# Patient Record
Sex: Female | Born: 1995 | Race: Black or African American | Hispanic: No | Marital: Single | Smoking: Never smoker
Health system: Southern US, Community
[De-identification: ages and names within clinical notes are randomized; demographics above are authoritative.]

## PROBLEM LIST (undated history)

## (undated) ENCOUNTER — Inpatient Hospital Stay (HOSPITAL_COMMUNITY): Payer: Self-pay

## (undated) ENCOUNTER — Ambulatory Visit: Admission: EM | Payer: Commercial Managed Care - PPO | Source: Home / Self Care

## (undated) DIAGNOSIS — K219 Gastro-esophageal reflux disease without esophagitis: Secondary | ICD-10-CM

## (undated) DIAGNOSIS — D869 Sarcoidosis, unspecified: Secondary | ICD-10-CM

## (undated) HISTORY — DX: Sarcoidosis, unspecified: D86.9

## (undated) HISTORY — DX: Gastro-esophageal reflux disease without esophagitis: K21.9

---

## 2017-06-19 DIAGNOSIS — R202 Paresthesia of skin: Secondary | ICD-10-CM | POA: Insufficient documentation

## 2018-03-26 ENCOUNTER — Encounter (HOSPITAL_COMMUNITY): Payer: Self-pay

## 2018-03-26 ENCOUNTER — Other Ambulatory Visit: Payer: Self-pay

## 2018-03-26 ENCOUNTER — Inpatient Hospital Stay (HOSPITAL_COMMUNITY)
Admission: AD | Admit: 2018-03-26 | Discharge: 2018-03-27 | Disposition: A | Discharge status: Home / Self Care | Payer: Medicaid Other | Source: Ambulatory Visit | Attending: Obstetrics and Gynecology | Admitting: Obstetrics and Gynecology

## 2018-03-26 DIAGNOSIS — O26899 Other specified pregnancy related conditions, unspecified trimester: Secondary | ICD-10-CM

## 2018-03-26 DIAGNOSIS — G5603 Carpal tunnel syndrome, bilateral upper limbs: Secondary | ICD-10-CM | POA: Insufficient documentation

## 2018-03-26 DIAGNOSIS — O99353 Diseases of the nervous system complicating pregnancy, third trimester: Secondary | ICD-10-CM | POA: Insufficient documentation

## 2018-03-26 DIAGNOSIS — Z8249 Family history of ischemic heart disease and other diseases of the circulatory system: Secondary | ICD-10-CM | POA: Diagnosis not present

## 2018-03-26 DIAGNOSIS — Z3A33 33 weeks gestation of pregnancy: Secondary | ICD-10-CM | POA: Diagnosis not present

## 2018-03-26 DIAGNOSIS — O26893 Other specified pregnancy related conditions, third trimester: Secondary | ICD-10-CM | POA: Diagnosis not present

## 2018-03-26 DIAGNOSIS — M25532 Pain in left wrist: Secondary | ICD-10-CM | POA: Insufficient documentation

## 2018-03-26 DIAGNOSIS — M79642 Pain in left hand: Secondary | ICD-10-CM | POA: Diagnosis not present

## 2018-03-26 DIAGNOSIS — M7989 Other specified soft tissue disorders: Secondary | ICD-10-CM | POA: Diagnosis present

## 2018-03-26 DIAGNOSIS — N9489 Other specified conditions associated with female genital organs and menstrual cycle: Secondary | ICD-10-CM

## 2018-03-26 DIAGNOSIS — G5600 Carpal tunnel syndrome, unspecified upper limb: Secondary | ICD-10-CM | POA: Diagnosis not present

## 2018-03-26 DIAGNOSIS — M25531 Pain in right wrist: Secondary | ICD-10-CM | POA: Insufficient documentation

## 2018-03-26 DIAGNOSIS — O9989 Other specified diseases and conditions complicating pregnancy, childbirth and the puerperium: Secondary | ICD-10-CM | POA: Insufficient documentation

## 2018-03-26 DIAGNOSIS — M79641 Pain in right hand: Secondary | ICD-10-CM | POA: Insufficient documentation

## 2018-03-26 DIAGNOSIS — O0933 Supervision of pregnancy with insufficient antenatal care, third trimester: Secondary | ICD-10-CM

## 2018-03-26 DIAGNOSIS — O1203 Gestational edema, third trimester: Secondary | ICD-10-CM | POA: Diagnosis not present

## 2018-03-26 LAB — URINALYSIS, ROUTINE W REFLEX MICROSCOPIC
BILIRUBIN URINE: NEGATIVE
Glucose, UA: NEGATIVE mg/dL
Hgb urine dipstick: NEGATIVE
Ketones, ur: NEGATIVE mg/dL
Leukocytes, UA: NEGATIVE
NITRITE: NEGATIVE
PH: 7 (ref 5.0–8.0)
Protein, ur: NEGATIVE mg/dL
SPECIFIC GRAVITY, URINE: 1.003 — AB (ref 1.005–1.030)

## 2018-03-26 LAB — POCT PREGNANCY, URINE: PREG TEST UR: POSITIVE — AB

## 2018-03-26 NOTE — MAU Note (Signed)
Pt states that she moved to [place] yesterday from [place].  Pt states that she has experienced feet swelling the last couple of weeks. SHe states she has been seen and worked up for this issue, however her appointment for the results was today. She missed it because of the move.   Pt states she has had wrist pain for 1 week.   Reports +FM   Denies vaginal bleeding or LOF.

## 2018-03-27 DIAGNOSIS — G5600 Carpal tunnel syndrome, unspecified upper limb: Secondary | ICD-10-CM

## 2018-03-27 DIAGNOSIS — O1203 Gestational edema, third trimester: Secondary | ICD-10-CM

## 2018-03-27 DIAGNOSIS — O26893 Other specified pregnancy related conditions, third trimester: Secondary | ICD-10-CM

## 2018-03-27 LAB — RAPID URINE DRUG SCREEN, HOSP PERFORMED
AMPHETAMINES: NOT DETECTED
BENZODIAZEPINES: NOT DETECTED
Barbiturates: NOT DETECTED
Cocaine: NOT DETECTED
Opiates: NOT DETECTED
Tetrahydrocannabinol: NOT DETECTED

## 2018-03-27 LAB — FETAL FIBRONECTIN: Fetal Fibronectin: NEGATIVE

## 2018-03-27 NOTE — MAU Provider Note (Signed)
Chief Complaint:  Foot Swelling and Wrist Pain   First Provider Initiated Contact with Patient 03/26/18 2349     HPI: Jodi Norman is a 22 y.o. G1P0 at 12w1dwho presents to maternity admissions reporting swelling in feet for the past few months and pain in both hands from preexisting carpal tunnel syndrome (which she has even when not pregnant).  No emergent complaints.  .  She reports good fetal movement, denies LOF, vaginal bleeding, vaginal itching/burning, urinary symptoms, h/a, dizziness, n/v, diarrhea, constipation or fever/chills.  She denies headache, visual changes or RUQ abdominal pain.  States moved here from [place] yesterday.  States had to leave "suddenly" due to "stress".  Denies threats or violence in her life. Does not offer any other explanation. Was due to go to OB there today to "find out about the workup for my swollen feet", "but couldn't go because I was here".  Did not call previous office to notify them.  States pregnancy has been normal except for EIF "which turned out to be normal".  Had workup for glucola but does not know results.  Has had no HTN.  Had one episode of 'preterm labor" but was sent home and not given any medication for it.    Wrist Pain   The pain is present in the left wrist and right wrist. This is a recurrent problem. The current episode started more than 1 year ago. There has been no history of extremity trauma. The problem occurs constantly. The problem has been unchanged. Pertinent negatives include no fever, inability to bear weight, itching, joint locking, joint swelling, limited range of motion or numbness. She has tried nothing for the symptoms.    RN Note: Pt states that she moved to [place] yesterday from [place]. Pt states that she has experienced feet swelling the last couple of weeks. SHe states she has been seen and worked up for this issue, however her appointment for the results was today. She missed it because of the move.  Pt states she  has had wrist pain for 1 week.  Reports +FM  Denies vaginal bleeding or LOF.  Past Medical History: History reviewed. No pertinent past medical history.  Past obstetric history: OB History  Gravida Para Term Preterm AB Living  1            SAB TAB Ectopic Multiple Live Births               # Outcome Date GA Lbr Len/2nd Weight Sex Delivery Anes PTL Lv  1 Current             Past Surgical History: History reviewed. No pertinent surgical history.  Family History: Family History  Problem Relation Age of Onset  . Hyperlipidemia Mother   . Hypertension Mother   . Hyperlipidemia Father   . Hypertension Father   . Diabetes Father     Social History: Social History   Tobacco Use  . Smoking status: Never Smoker  . Smokeless tobacco: Never Used  Substance Use Topics  . Alcohol use: Never    Frequency: Never  . Drug use: Never    Allergies: No Known Allergies  Meds:  No medications prior to admission.    I have reviewed patient's Past Medical Hx, Surgical Hx, Family Hx, Social Hx, medications and allergies.   ROS:  Review of Systems  Constitutional: Negative for fever.  Respiratory: Negative for shortness of breath.   Cardiovascular: Positive for leg swelling.  Genitourinary: Negative for  dysuria, pelvic pain and vaginal bleeding.  Skin: Negative for itching.  Neurological: Negative for numbness.   Other systems negative  Physical Exam   Patient Vitals for the past 24 hrs:  BP Temp Pulse Resp SpO2 Weight  03/26/18 2324 131/72 98.1 F (36.7 C) 78 12 99 % -  03/26/18 2301 - - - - - 95 kg   Constitutional: Well-developed, well-nourished female in no acute distress.  Cardiovascular: normal rate and rhythm Respiratory: normal effort, clear to auscultation bilaterally GI: Abd soft, non-tender, gravid appropriate for gestational age.   No rebound or guarding. MS: Extremities nontender, Tr-1+ pedal edema, normal ROM Neurologic: Alert and oriented x 4.  DTRs  2+, good ROM in both wrists GU: Neg CVAT.  PELVIC EXAM: Cervix pink, visually closed, without lesion, scant white creamy discharge, vaginal walls and external genitalia normal Bimanual exam: Cervix firm, posterior, neg CMT, uterus nontender, Fundal Height consistent with dates, adnexa without tenderness, enlargement, or mass  Dilation: Closed Effacement (%): Thick Station: -3, Ballotable Presentation: Vertex Exam by:: Hansel Feinstein, CNM   FHT:  Baseline 140 , moderate variability, accelerations present, no decelerations Contractions: Uterine irritability with rare contractions.    Labs: Results for orders placed or performed during the hospital encounter of 03/26/18 (from the past 24 hour(s))  Urinalysis, Routine w reflex microscopic     Status: Abnormal   Collection Time: 03/26/18 11:17 PM  Result Value Ref Range   Color, Urine STRAW (A) YELLOW   APPearance CLEAR CLEAR   Specific Gravity, Urine 1.003 (L) 1.005 - 1.030   pH 7.0 5.0 - 8.0   Glucose, UA NEGATIVE NEGATIVE mg/dL   Hgb urine dipstick NEGATIVE NEGATIVE   Bilirubin Urine NEGATIVE NEGATIVE   Ketones, ur NEGATIVE NEGATIVE mg/dL   Protein, ur NEGATIVE NEGATIVE mg/dL   Nitrite NEGATIVE NEGATIVE   Leukocytes, UA NEGATIVE NEGATIVE  Urine rapid drug screen (hosp performed)     Status: None   Collection Time: 03/26/18 11:17 PM  Result Value Ref Range   Opiates NONE DETECTED NONE DETECTED   Cocaine NONE DETECTED NONE DETECTED   Benzodiazepines NONE DETECTED NONE DETECTED   Amphetamines NONE DETECTED NONE DETECTED   Tetrahydrocannabinol NONE DETECTED NONE DETECTED   Barbiturates NONE DETECTED NONE DETECTED  Pregnancy, urine POC     Status: Abnormal   Collection Time: 03/26/18 11:19 PM  Result Value Ref Range   Preg Test, Ur POSITIVE (A) NEGATIVE  Fetal fibronectin     Status: None   Collection Time: 03/27/18 12:04 AM  Result Value Ref Range   Fetal Fibronectin NEGATIVE NEGATIVE    Imaging:  No results found.  MAU  Course/MDM: I have ordered labs and reviewed results. Fetal fibronectin done due to reported hx PTL  UDS sent.  Declined STD testing, states had it done recently.  NST reviewed, reactive ROI sent to clinic in [place] for records Patient states she may deliver at [facility], unsure  Living in [place] Has no plans to obtain prenatal care, will provide list.    Assessment: Single intrauterine pregnancy at [redacted]w[redacted]d Bilateral wrist pain Bilateral feet swelling causing discomfort Uterine irritability with negative fetal fibronectin  Plan: Discharge home Preterm Labor precautions and fetal kick counts Advised to seek prenatal care  And decide on place of delivery  Encouraged to return here or to other Urgent Care/ED if she develops worsening of symptoms, increase in pain, fever, or other concerning symptoms.   Pt stable at time of discharge.  Hansel Feinstein CNM,  MSN Certified Nurse-Midwife 03/27/2018 12:07 AM

## 2018-03-27 NOTE — Discharge Instructions (Signed)
Carpal Tunnel Syndrome Carpal tunnel syndrome is a condition that causes pain in your hand and arm. The carpal tunnel is a narrow area that is on the palm side of your wrist. Repeated wrist motion or certain diseases may cause swelling in the tunnel. This swelling can pinch the main nerve in the wrist (median nerve). Follow these instructions at home: If you have a splint:  Wear it as told by your doctor. Remove it only as told by your doctor.  Loosen the splint if your fingers: ? Become numb and tingle. ? Turn blue and cold.  Keep the splint clean and dry. General instructions  Take over-the-counter and prescription medicines only as told by your doctor.  Rest your wrist from any activity that may be causing your pain. If needed, talk to your employer about changes that can be made in your work, such as getting a wrist pad to use while typing.  If directed, apply ice to the painful area: ? Put ice in a plastic bag. ? Place a towel between your skin and the bag. ? Leave the ice on for 20 minutes, 2-3 times per day.  Keep all follow-up visits as told by your doctor. This is important.  Do any exercises as told by your doctor, physical therapist, or occupational therapist. Contact a doctor if:  You have new symptoms.  Medicine does not help your pain.  Your symptoms get worse. This information is not intended to replace advice given to you by your health care provider. Make sure you discuss any questions you have with your health care provider. Document Released: 05/03/2011 Document Revised: 10/20/2015 Document Reviewed: 09/29/2014 Elsevier Interactive Patient Education  2018 [place].   Abdominal Pain During Pregnancy Abdominal pain is common in pregnancy. Most of the time, it does not cause harm. There are many causes of abdominal pain. Some causes are more serious than others and sometimes the cause is not known. Abdominal pain can be a sign that something is very wrong  with the pregnancy or the pain may have nothing to do with the pregnancy. Always tell your health care provider if you have any abdominal pain. Follow these instructions at home:  Do not have sex or put anything in your vagina until your symptoms go away completely.  Watch your abdominal pain for any changes.  Get plenty of rest until your pain improves.  Drink enough fluid to keep your urine clear or pale yellow.  Take over-the-counter or prescription medicines only as told by your health care provider.  Keep all follow-up visits as told by your health care provider. This is important. Contact a health care provider if:  You have a fever.  Your pain gets worse or you have cramping.  Your pain continues after resting. Get help right away if:  You are bleeding, leaking fluid, or passing tissue from the vagina.  You have vomiting or diarrhea that does not go away.  You have painful or bloody urination.  You notice a decrease in your baby's movements. You feel very weak or faint. Edema Edema is an abnormal buildup of fluids in your bodytissues. Edema is somewhatdependent on gravity to pull the fluid to the lowest place in your body. That makes the condition more common in the legs and thighs (lower extremities). Painless swelling of the feet and ankles is common and becomes more likely as you get older. It is also common in looser tissues, like around your eyes. When the affected area is  squeezed, the fluid may move out of that spot and leave a dent for a few moments. This dent is called pitting. What are the causes? There are many possible causes of edema. Eating too much salt and being on your feet or sitting for a long time can cause edema in your legs and ankles. Hot weather may make edema worse. Common medical causes of edema include: Heart failure. Liver disease. Kidney disease. Weak blood vessels in your legs. Cancer. An injury. Pregnancy. Some  medications. Obesity.  What are the signs or symptoms? Edema is usually painless.Your skin may look swollen or shiny. How is this diagnosed? Your health care provider may be able to diagnose edema by asking about your medical history and doing a physical exam. You may need to have tests such as X-rays, an electrocardiogram, or blood tests to check for medical conditions that may cause edema. How is this treated? Edema treatment depends on the cause. If you have heart, liver, or kidney disease, you need the treatment appropriate for these conditions. General treatment may include: Elevation of the affected body part above the level of your heart. Compression of the affected body part. Pressure from elastic bandages or support stockings squeezes the tissues and forces fluid back into the blood vessels. This keeps fluid from entering the tissues. Restriction of fluid and salt intake. Use of a water pill (diuretic). These medications are appropriate only for some types of edema. They pull fluid out of your body and make you urinate more often. This gets rid of fluid and reduces swelling, but diuretics can have side effects. Only use diuretics as directed by your health care provider.  Follow these instructions at home: Keep the affected body part above the level of your heart when you are lying down. Do not sit still or stand for prolonged periods. Do not put anything directly under your knees when lying down. Do not wear constricting clothing or garters on your upper legs. Exercise your legs to work the fluid back into your blood vessels. This may help the swelling go down. Wear elastic bandages or support stockings to reduce ankle swelling as directed by your health care provider. Eat a low-salt diet to reduce fluid if your health care provider recommends it. Only take medicines as directed by your health care provider. Contact a health care provider if: Your edema is not responding to  treatment. You have heart, liver, or kidney disease and notice symptoms of edema. You have edema in your legs that does not improve after elevating them. You have sudden and unexplained weight gain. Get help right away if: You develop shortness of breath or chest pain. You cannot breathe when you lie down. You develop pain, redness, or warmth in the swollen areas. You have heart, liver, or kidney disease and suddenly get edema. You have a fever and your symptoms suddenly get worse. This information is not intended to replace advice given to you by your health care provider. Make sure you discuss any questions you have with your health care provider. Document Released: 05/14/2005 Document Revised: 10/20/2015 Document Reviewed: 03/06/2013 Elsevier Interactive Patient Education  2017 [place] have shortness of breath.  You develop a severe headache with abdominal pain.  You have abnormal vaginal discharge with abdominal pain. This information is not intended to replace advice given to you by your health care provider. Make sure you discuss any questions you have with your health care provider. Document Released: 05/14/2005 Document  Revised: 02/23/2016 Document Reviewed: 12/11/2012 Elsevier Interactive Patient Education  2018 [place] for [employer] at [facility]       Phone: [phone]  [facility] for [employer] at [place] Phone: [place] for [employer] at [place]  Phone: [place] for [place] at [facility]  Phone: [place] for [place] at [place]  Phone: [facility]       Phone: [phone]  [facility]    Phone: [phone]   [facility] [place])    Phone: [facility]    Phone: [phone]  [facility]    Phone: [address] [place]    Phone: [phone]  [place] [facility]       Phone: [phone]   [place] [facility]  Phone: [place]    Phone: [phone]  [facility]   Phone: [phone]  [facility]      Phone: [phone]  [facility]    Phone: [phone]

## 2018-05-07 DIAGNOSIS — G5603 Carpal tunnel syndrome, bilateral upper limbs: Secondary | ICD-10-CM | POA: Insufficient documentation

## 2018-08-06 ENCOUNTER — Encounter (HOSPITAL_COMMUNITY): Payer: Self-pay

## 2020-01-13 ENCOUNTER — Encounter (HOSPITAL_COMMUNITY): Payer: Self-pay | Admitting: Emergency Medicine

## 2020-01-13 ENCOUNTER — Emergency Department (HOSPITAL_COMMUNITY)
Admission: EM | Admit: 2020-01-13 | Discharge: 2020-01-13 | Disposition: A | Discharge status: Home / Self Care | Payer: Medicaid Other | Attending: Emergency Medicine | Admitting: Emergency Medicine

## 2020-01-13 ENCOUNTER — Other Ambulatory Visit: Payer: Self-pay

## 2020-01-13 DIAGNOSIS — J029 Acute pharyngitis, unspecified: Secondary | ICD-10-CM | POA: Insufficient documentation

## 2020-01-13 DIAGNOSIS — R0981 Nasal congestion: Secondary | ICD-10-CM | POA: Insufficient documentation

## 2020-01-13 DIAGNOSIS — Z20822 Contact with and (suspected) exposure to covid-19: Secondary | ICD-10-CM | POA: Insufficient documentation

## 2020-01-13 DIAGNOSIS — J069 Acute upper respiratory infection, unspecified: Secondary | ICD-10-CM | POA: Insufficient documentation

## 2020-01-13 DIAGNOSIS — R509 Fever, unspecified: Secondary | ICD-10-CM | POA: Insufficient documentation

## 2020-01-13 LAB — SARS CORONAVIRUS 2 BY RT PCR (HOSPITAL ORDER, PERFORMED IN ~~LOC~~ HOSPITAL LAB): SARS Coronavirus 2: NEGATIVE

## 2020-01-13 MED ORDER — BENZONATATE 100 MG PO CAPS: 100.0000 mg | ORAL_CAPSULE | Freq: Three times a day (TID) | ORAL | 0 refills | Status: DC

## 2020-01-13 MED ORDER — LIDOCAINE VISCOUS HCL 2 % MT SOLN: 15.0000 mL | OROMUCOSAL | 0 refills | Status: DC | PRN

## 2020-01-13 MED ORDER — IBUPROFEN 800 MG PO TABS
800.0000 mg | ORAL_TABLET | Freq: Once | ORAL | Status: AC
  Administered 2020-01-13: 800 mg via ORAL
  Filled 2020-01-13: qty 1

## 2020-01-13 MED ORDER — LIDOCAINE VISCOUS HCL 2 % MT SOLN
15.0000 mL | Freq: Once | OROMUCOSAL | Status: AC
  Administered 2020-01-13: 15 mL via OROMUCOSAL
  Filled 2020-01-13: qty 15

## 2020-01-13 MED ORDER — IBUPROFEN 800 MG PO TABS: 800.0000 mg | ORAL_TABLET | Freq: Three times a day (TID) | ORAL | 0 refills | Status: DC | PRN

## 2020-01-13 NOTE — Discharge Instructions (Signed)
I suspect you have a virus. We tested your for COVID-19 (coronavirus) infection.  It is also possible you could have other viral upper respiratory infection from another virus.     -Prescription has been sent to her pharmacy for Tessalon.  This is a cough medicine. -Prescription also sent for ibuprofen.  Take with food so it does not cause an upset stomach.  Take only if needed. -Prescription for lidocaine solution to help with your sore throat if needed.  Test results come back in 24 hours, sometimes sooner.  Someone will call you if ypu are positive for COVID. If the result is negative you can see it on your MyChart.  Treatment of your illness and symptoms will include self-isolation, monitoring of symptoms and supportive care with over-the-counter medicines.    Return to the ED if there is increased work of breathing, shortness of breath, inability to tolerate fluids, weakness, chest pain.  If your test results are POSITIVE, the following isolation requirements need to be met to return to work and resume essential activities: At least 10 days since symptom onset  72 hours of absence of fever without antifever medicine (ibuprofen, acetaminophen). A fever is temperature of 100.35F or greater. Improvement of respiratory symptoms  If your test is NEGATIVE, you may return to work and essential activities as long as your symptoms have improved and you do not have a fever for a total of 3 days.  Call your job and notify them that your test result was negative to see if they will allow you to return to work.   Stay well-hydrated. Rest. You can use over the counter medications to help with symptoms: 600 mg ibuprofen (motrin, aleve, advil) or acetaminophen (tylenol) every 6 hours, around the clock to help with associated fevers, sore throat, headaches, generalized body aches and malaise.  Oxymetazoline (afrin) intranasal spray once daily for no more than 3 days to help with congestion, after 3 days  you can switch to another over-the-counter nasal steroid spray such as fluticasone (flonase) Allergy medication (loratadine, cetirizine, etc) and phenylephrine (sudafed) help with nasal congestion, runny nose and postnasal drip.   Dextromethorphan (Delsym) to suppress dry cough. Frequent coughing is likely causing your chest wall pain Wash your hands often to prevent spread.    Infection Prevention Recommendations for Individuals Confirmed to have, or Being Evaluated for, or have symptoms of 2019 Novel Coronavirus (COVID-19) Infection Who Receive Care at Home  Individuals who are confirmed to have, or are being evaluated for, COVID-19 should follow the prevention steps below until a healthcare provider or local or state health department says they can return to normal activities.  Stay home except to get medical care You should restrict activities outside your home, except for getting medical care. Do not go to work, school, or public areas, and do not use public transportation or taxis.  Call ahead before visiting your doctor Before your medical appointment, call the healthcare provider and tell them that you have, or are being evaluated for, COVID-19 infection. This will help the healthcare provider's office take steps to keep other people from getting infected. Ask your healthcare provider to call the local or state health department.  Monitor your symptoms Seek prompt medical attention if your illness is worsening (e.g., difficulty breathing). Before going to your medical appointment, call the healthcare provider and tell them that you have, or are being evaluated for, COVID-19 infection. Ask your healthcare provider to call the local or state health department.  Wear  a facemask You should wear a facemask that covers your nose and mouth when you are in the same room with other people and when you visit a healthcare provider. People who live with or visit you should also wear a facemask  while they are in the same room with you.  Separate yourself from other people in your home As much as possible, you should stay in a different room from other people in your home. Also, you should use a separate bathroom, if available.  Avoid sharing household items You should not share dishes, drinking glasses, cups, eating utensils, towels, bedding, or other items with other people in your home. After using these items, you should wash them thoroughly with soap and water.  Cover your coughs and sneezes Cover your mouth and nose with a tissue when you cough or sneeze, or you can cough or sneeze into your sleeve. Throw used tissues in a lined trash can, and immediately wash your hands with soap and water for at least 20 seconds or use an alcohol-based hand rub.  Wash your [facility] your hands often and thoroughly with soap and water for at least 20 seconds. You can use an alcohol-based hand sanitizer if soap and water are not available and if your hands are not visibly dirty. Avoid touching your eyes, nose, and mouth with unwashed hands.   Prevention Steps for Caregivers and Household Members of Individuals Confirmed to have, or Being Evaluated for, or have symptoms of 2019 Novel Coronavirus (COVID-19) Infection Being Cared for in the Home  If you live with, or provide care at home for, a person confirmed to have, or being evaluated for, COVID-19 infection please follow these guidelines to prevent infection:  Follow healthcare provider's instructions Make sure that you understand and can help the patient follow any healthcare provider instructions for all care.  Provide for the patient's basic needs You should help the patient with basic needs in the home and provide support for getting groceries, prescriptions, and other personal needs.  Monitor the patient's symptoms If they are getting sicker, call his or her medical provider and tell them that the patient has, or is being evaluated  for, COVID-19 infection. This will help the healthcare provider's office take steps to keep other people from getting infected. Ask the healthcare provider to call the local or state health department.  Limit the number of people who have contact with the patient If possible, have only one caregiver for the patient. Other household members should stay in another home or place of residence. If this is not possible, they should stay in another room, or be separated from the patient as much as possible. Use a separate bathroom, if available. Restrict visitors who do not have an essential need to be in the home.  Keep older adults, very young children, and other sick people away from the patient Keep older adults, very young children, and those who have compromised immune systems or chronic health conditions away from the patient. This includes people with chronic heart, lung, or kidney conditions, diabetes, and cancer.  Ensure good ventilation Make sure that shared spaces in the home have good air flow, such as from an air conditioner or an opened window, weather permitting.  Wash your hands often Wash your hands often and thoroughly with soap and water for at least 20 seconds. You can use an alcohol based hand sanitizer if soap and water are not available and if your hands are not visibly dirty.  Avoid touching your eyes, nose, and mouth with unwashed hands. Use disposable paper towels to dry your hands. If not available, use dedicated cloth towels and replace them when they become wet.  Wear a facemask and gloves Wear a disposable facemask at all times in the room and gloves when you touch or have contact with the patient's blood, body fluids, and/or secretions or excretions, such as sweat, saliva, sputum, nasal mucus, vomit, urine, or feces.  Ensure the mask fits over your nose and mouth tightly, and do not touch it during use. Throw out disposable facemasks and gloves after using them. Do not  reuse. Wash your hands immediately after removing your facemask and gloves. If your personal clothing becomes contaminated, carefully remove clothing and launder. Wash your hands after handling contaminated clothing. Place all used disposable facemasks, gloves, and other waste in a lined container before disposing them with other household waste. Remove gloves and wash your hands immediately after handling these items.  Do not share dishes, glasses, or other household items with the patient Avoid sharing household items. You should not share dishes, drinking glasses, cups, eating utensils, towels, bedding, or other items with a patient who is confirmed to have, or being evaluated for, COVID-19 infection. After the person uses these items, you should wash them thoroughly with soap and water.  Wash laundry thoroughly Immediately remove and wash clothes or bedding that have blood, body fluids, and/or secretions or excretions, such as sweat, saliva, sputum, nasal mucus, vomit, urine, or feces, on them. Wear gloves when handling laundry from the patient. Read and follow directions on labels of laundry or clothing items and detergent. In general, wash and dry with the warmest temperatures recommended on the label.  Clean all areas the individual has used often Clean all touchable surfaces, such as counters, tabletops, doorknobs, bathroom fixtures, toilets, phones, keyboards, tablets, and bedside tables, every day. Also, clean any surfaces that may have blood, body fluids, and/or secretions or excretions on them. Wear gloves when cleaning surfaces the patient has come in contact with. Use a diluted bleach solution (e.g., dilute bleach with 1 part bleach and 10 parts water) or a household disinfectant with a label that says EPA-registered for coronaviruses. To make a bleach solution at home, add 1 tablespoon of bleach to 1 quart (4 cups) of water. For a larger supply, add  cup of bleach to 1 gallon (16  cups) of water. Read labels of cleaning products and follow recommendations provided on product labels. Labels contain instructions for safe and effective use of the cleaning product including precautions you should take when applying the product, such as wearing gloves or eye protection and making sure you have good ventilation during use of the product. Remove gloves and wash hands immediately after cleaning.  Monitor yourself for signs and symptoms of illness Caregivers and household members are considered close contacts, should monitor their health, and will be asked to limit movement outside of the home to the extent possible. Follow the monitoring steps for close contacts listed on the symptom monitoring form.  ? If you have additional questions, contact your local health department or call the epidemiologist on call at [phone] (available 24/7). ? This guidance is subject to change. For the most up-to-date guidance from [facility], please refer to their website: [employer]Blogs.pl

## 2020-01-13 NOTE — ED Provider Notes (Signed)
[facility] DEPT Provider Note   CSN: [id number] Arrival date & time: 01/13/20  0831     History Chief Complaint  Patient presents with  . Cough  . Sore Throat    Jodi Norman is a 24 y.o. female with no known past medical history.  Patient did not receive Covid vaccinations.  HPI Patient presents to emergency department today with chief complaint of URI symptoms.  She is endorsing fever, sore throat and nasal congestion x1 week.  Her 41 month old daughter is here with similar symptoms.  Patient states she has a nonproductive cough and subjective fever.  She describes her sore throat as a scratchy feeling when swallowing. She has been taking ibuprofen with symptom improvement.  She denies known Covid exposures.         History reviewed. No pertinent past medical history.  There are no problems to display for this patient.   History reviewed. No pertinent surgical history.   OB History    Gravida  1   Para      Term      Preterm      AB      Living        SAB      TAB      Ectopic      Multiple      Live Births              Family History  Problem Relation Age of Onset  . Hyperlipidemia Mother   . Hypertension Mother   . Hyperlipidemia Father   . Hypertension Father   . Diabetes Father     Social History   Tobacco Use  . Smoking status: Never Smoker  . Smokeless tobacco: Never Used  Substance Use Topics  . Alcohol use: Never  . Drug use: Never    Home Medications Prior to Admission medications   Medication Sig Start Date End Date Taking? Authorizing Provider  benzonatate (TESSALON) 100 MG capsule Take 1 capsule (100 mg total) by mouth every 8 (eight) hours. 01/13/20   Rena Sweeden E, PA-C  ibuprofen (ADVIL) 800 MG tablet Take 1 tablet (800 mg total) by mouth 3 (three) times daily as needed. 01/13/20   Sanaiyah Kirchhoff E, PA-C  lidocaine (XYLOCAINE) 2 % solution Use as directed 15 mLs in the mouth or  throat as needed for mouth pain. 01/13/20   Shamaria Kavan, Harley Hallmark, PA-C    Allergies    Patient has no known allergies.  Review of Systems   Review of Systems  Constitutional: Positive for fever.  HENT: Positive for congestion and sore throat.   All other systems reviewed and are negative.   Physical Exam Updated Vital Signs BP (!) 141/88 (BP Location: Left Arm)   Pulse 91   Temp 98.4 F (36.9 C) (Oral)   Resp 20   Ht 5\' 7"  (1.702 m)   Wt 95 kg   SpO2 99%   BMI 32.80 kg/m   Physical Exam Vitals and nursing note reviewed.  Constitutional:      Appearance: She is well-developed. She is not ill-appearing or toxic-appearing.  HENT:     Head: Normocephalic and atraumatic.     Comments: No sinus or temporal tenderness.    Nose: Congestion present.     Mouth/Throat:     Pharynx: Uvula midline. No oropharyngeal exudate or uvula swelling.     Comments: Minor erythema to oropharynx, no edema, no exudate, no tonsillar swelling, voice normal,  neck supple without lymphadenopathy  Eyes:     General: No scleral icterus.       Right eye: No discharge.        Left eye: No discharge.     Conjunctiva/sclera: Conjunctivae normal.  Neck:     Vascular: No JVD.  Cardiovascular:     Rate and Rhythm: Normal rate and regular rhythm.     Pulses: Normal pulses.     Heart sounds: Normal heart sounds.  Pulmonary:     Effort: Pulmonary effort is normal.     Breath sounds: Normal breath sounds.  Abdominal:     General: There is no distension.  Musculoskeletal:        General: Normal range of motion.     Cervical back: Normal range of motion.  Skin:    General: Skin is warm and dry.     Findings: No rash.  Neurological:     Mental Status: She is oriented to person, place, and time.     GCS: GCS eye subscore is 4. GCS verbal subscore is 5. GCS motor subscore is 6.     Comments: Fluent speech, no facial droop.  Psychiatric:        Behavior: Behavior normal.     ED Results / Procedures  / Treatments   Labs (all labs ordered are listed, but only abnormal results are displayed) Labs Reviewed  SARS CORONAVIRUS 2 BY RT PCR (HOSPITAL ORDER, [facility])    EKG None  Radiology No results found.  Procedures Procedures (including critical care time)  Medications Ordered in ED Medications  lidocaine (XYLOCAINE) 2 % viscous mouth solution 15 mL (15 mLs Mouth/Throat Given 01/13/20 1156)  ibuprofen (ADVIL) tablet 800 mg (800 mg Oral Given 01/13/20 1155)    ED Course  I have reviewed the triage vital signs and the nursing notes.  Pertinent labs & imaging results that were available during my care of the patient were reviewed by me and considered in my medical decision making (see chart for details).    MDM Rules/Calculators/A&P                          History provided by patient with additional history obtained from chart review.    I have reviewed patient's EMR to obtain pertinent PMH to assist in MDM.  Symptoms and exam most suggestive of uncomplicated viral illness. DDX incluldes viral URI/LRI, COVID-19.  No travel. No known exposures to confirmed COVID-19.    Exam is benign.  Normal WOB. No fever, tachypnea, tachycardia, hypoxemia. Lungs are CTAB. I do not think that a CXR is indicated at this time as VS are WNL, there are no signs of consolidation on auscultation and there is no hypoxia, increased WOB or other concerning features to exam. No significant h/o immunocompromise. Doubt bacterial bronchitis or pneumonia.  No signs or symptoms to suggest strep pharyngitis.  No clinical signs of severe illness, dehydration, to warrant further emergent work up in ER. Covid test is pending. Patient ambulated in the emergency department without respiratory distress or hypoxia, SpO2 >95% on room air.  Patient denies chance of pregnancy and politely refuses pregnancy test.  Given reassuring physical exam, symptoms, will discharge with symptomatic  treatment. Recommend telemedicine PCP f/u in the next 2-3 days for persistent symptoms  for further guidance. Self-isolation instructions discussed. Pt was given home self-isolation instructions and instructions for family members.   Pt understands signs and  symptoms that would warrant return to ED.  Pt comfortable and agreeable with POC.  Jodi Norman was evaluated in Emergency Department on 01/13/2020 for the symptoms described in the history of present illness. She was evaluated in the context of the global COVID-19 pandemic, which necessitated consideration that the patient might be at risk for infection with the SARS-CoV-2 virus that causes COVID-19. Institutional protocols and algorithms that pertain to the evaluation of patients at risk for COVID-19 are in a state of rapid change based on information released by regulatory bodies including the CDC and federal and state organizations. These policies and algorithms were followed during the patient's care in the ED.   Portions of this note were generated with [employer]. Dictation errors may occur despite best attempts at proofreading.   Final Clinical Impression(s) / ED Diagnoses Final diagnoses:  Upper respiratory tract infection, unspecified type    Rx / DC Orders ED Discharge Orders         Ordered    lidocaine (XYLOCAINE) 2 % solution  As needed     Discontinue  Reprint     01/13/20 1201    benzonatate (TESSALON) 100 MG capsule  Every 8 hours     Discontinue  Reprint     01/13/20 1201    ibuprofen (ADVIL) 800 MG tablet  3 times daily PRN     Discontinue  Reprint     01/13/20 1201           Jil Penland, Harley Hallmark, PA-C 01/13/20 1204    Milton Ferguson, MD 01/15/20 [phone]

## 2020-01-13 NOTE — ED Triage Notes (Signed)
States she got sick from her child, has had a fever, sore throat and congestion x1 week. States she just does not feel well.

## 2020-06-22 ENCOUNTER — Emergency Department (HOSPITAL_BASED_OUTPATIENT_CLINIC_OR_DEPARTMENT_OTHER): Payer: Medicaid Other

## 2020-06-22 ENCOUNTER — Emergency Department (HOSPITAL_BASED_OUTPATIENT_CLINIC_OR_DEPARTMENT_OTHER)
Admission: EM | Admit: 2020-06-22 | Discharge: 2020-06-23 | Disposition: A | Discharge status: Home / Self Care | Payer: Medicaid Other | Attending: Emergency Medicine | Admitting: Emergency Medicine

## 2020-06-22 ENCOUNTER — Encounter (HOSPITAL_BASED_OUTPATIENT_CLINIC_OR_DEPARTMENT_OTHER): Payer: Self-pay | Admitting: *Deleted

## 2020-06-22 ENCOUNTER — Other Ambulatory Visit: Payer: Self-pay

## 2020-06-22 DIAGNOSIS — U071 COVID-19: Secondary | ICD-10-CM | POA: Insufficient documentation

## 2020-06-22 DIAGNOSIS — M899 Disorder of bone, unspecified: Secondary | ICD-10-CM | POA: Diagnosis not present

## 2020-06-22 DIAGNOSIS — R0602 Shortness of breath: Secondary | ICD-10-CM | POA: Diagnosis present

## 2020-06-22 DIAGNOSIS — Q782 Osteopetrosis: Secondary | ICD-10-CM

## 2020-06-22 DIAGNOSIS — R5383 Other fatigue: Secondary | ICD-10-CM

## 2020-06-22 LAB — D-DIMER, QUANTITATIVE (NOT AT ARMC): D-Dimer, Quant: 0.83 ug/mL-FEU — ABNORMAL HIGH (ref 0.00–0.50)

## 2020-06-22 LAB — CBC WITH DIFFERENTIAL/PLATELET
Abs Immature Granulocytes: 0.02 10*3/uL (ref 0.00–0.07)
Basophils Absolute: 0 10*3/uL (ref 0.0–0.1)
Basophils Relative: 0 %
Eosinophils Absolute: 0.1 10*3/uL (ref 0.0–0.5)
Eosinophils Relative: 2 %
HCT: 37.2 % (ref 36.0–46.0)
Hemoglobin: 12.5 g/dL (ref 12.0–15.0)
Immature Granulocytes: 0 %
Lymphocytes Relative: 49 %
Lymphs Abs: 3.2 10*3/uL (ref 0.7–4.0)
MCH: 27.9 pg (ref 26.0–34.0)
MCHC: 33.6 g/dL (ref 30.0–36.0)
MCV: 83 fL (ref 80.0–100.0)
Monocytes Absolute: 0.4 10*3/uL (ref 0.1–1.0)
Monocytes Relative: 6 %
Neutro Abs: 2.9 10*3/uL (ref 1.7–7.7)
Neutrophils Relative %: 43 %
Platelets: 338 10*3/uL (ref 150–400)
RBC: 4.48 MIL/uL (ref 3.87–5.11)
RDW: 13.4 % (ref 11.5–15.5)
WBC: 6.6 10*3/uL (ref 4.0–10.5)
nRBC: 0 % (ref 0.0–0.2)

## 2020-06-22 LAB — COMPREHENSIVE METABOLIC PANEL
ALT: 25 U/L (ref 0–44)
AST: 27 U/L (ref 15–41)
Albumin: 4.5 g/dL (ref 3.5–5.0)
Alkaline Phosphatase: 100 U/L (ref 38–126)
Anion gap: 14 (ref 5–15)
BUN: 8 mg/dL (ref 6–20)
CO2: 21 mmol/L — ABNORMAL LOW (ref 22–32)
Calcium: 9.2 mg/dL (ref 8.9–10.3)
Chloride: 102 mmol/L (ref 98–111)
Creatinine, Ser: 0.68 mg/dL (ref 0.44–1.00)
GFR, Estimated: >60 mL/min (ref >60)
Glucose, Bld: 137 mg/dL — ABNORMAL HIGH (ref 70–99)
Potassium: 3.2 mmol/L — ABNORMAL LOW (ref 3.5–5.1)
Sodium: 137 mmol/L (ref 135–145)
Total Bilirubin: 0.2 mg/dL — ABNORMAL LOW (ref 0.3–1.2)
Total Protein: 8.3 g/dL — ABNORMAL HIGH (ref 6.5–8.1)

## 2020-06-22 LAB — HCG, SERUM, QUALITATIVE: Preg, Serum: NEGATIVE

## 2020-06-22 LAB — TROPONIN I (HIGH SENSITIVITY): Troponin I (High Sensitivity): 2 ng/L (ref <18)

## 2020-06-22 IMAGING — CT CT ANGIO CHEST
2 of 9 series · 18 of 37 positions shown · IV contrast (Omnipaque)
Comparison: Chest radiograph dated [DATE].

CLINICAL DATA: 24-year-old female with chest pain and shortness of
breath.

EXAM:
CT ANGIOGRAPHY CHEST WITH CONTRAST
TECHNIQUE: Multidetector CT imaging of the chest was performed using the
standard protocol during bolus administration of intravenous
contrast. Multiplanar CT image reconstructions and MIPs were
obtained to evaluate the vascular anatomy.
CONTRAST:  100mL OMNIPAQUE IOHEXOL 350 MG/ML SOLN

[Series 7: pe thins · axial · 0.62mm/px · z∈[-98,+125]mm · 15 of 251 slices shown]
[im 14/251  lung]
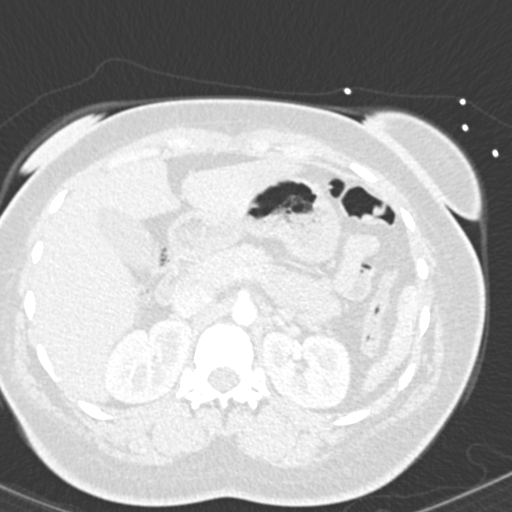
[im 27/251  mediastinal]
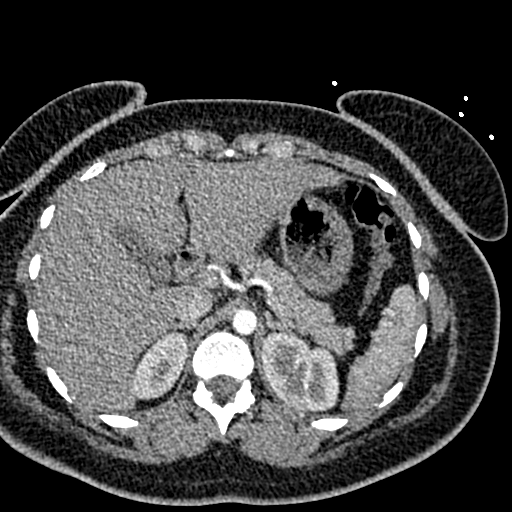
[im 53/251  lung]
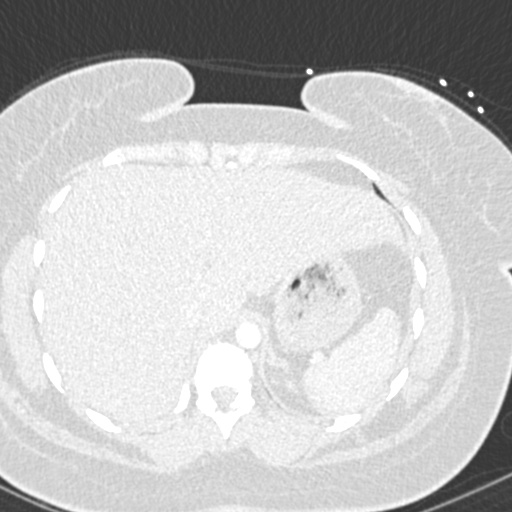
[im 66/251  mediastinal]
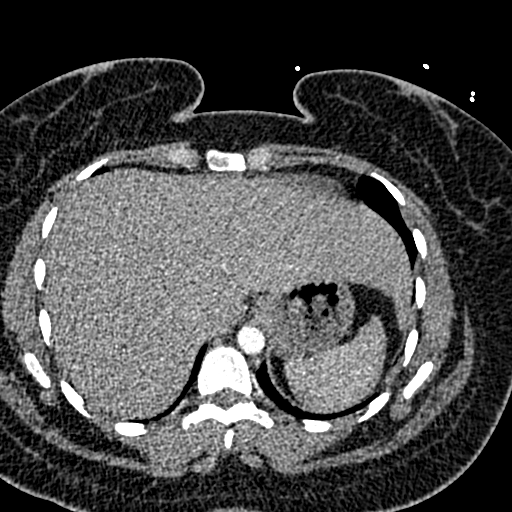
[im 79/251  lung]
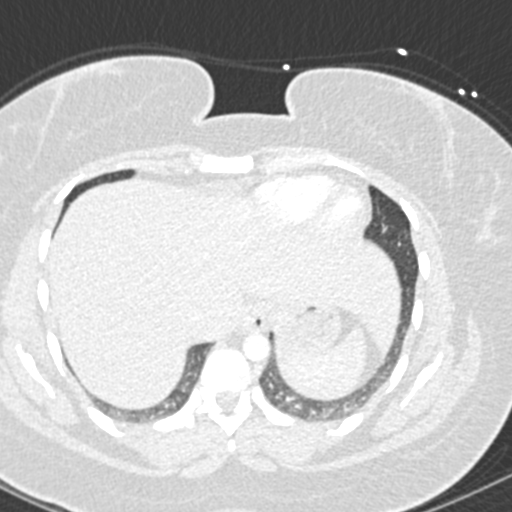
[im 93/251  mediastinal]
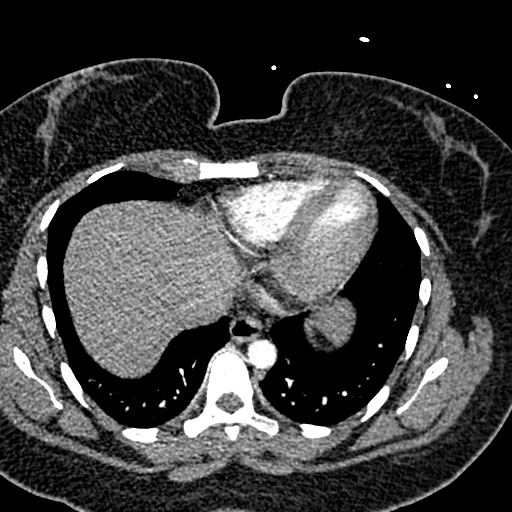
[im 106/251  lung]
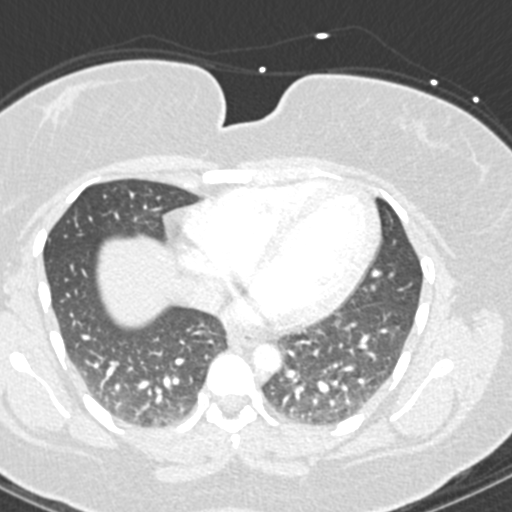
[im 132/251  mediastinal]
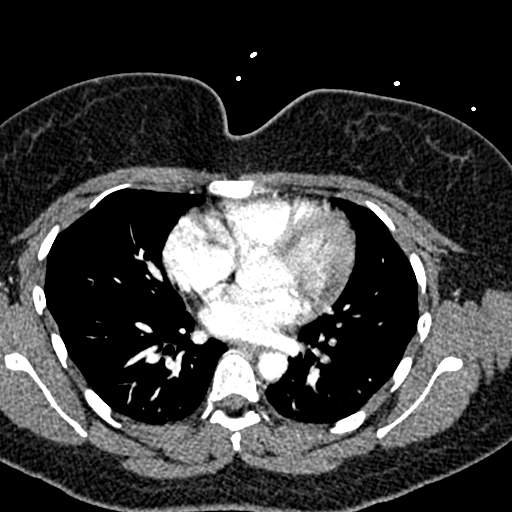
[im 145/251  lung]
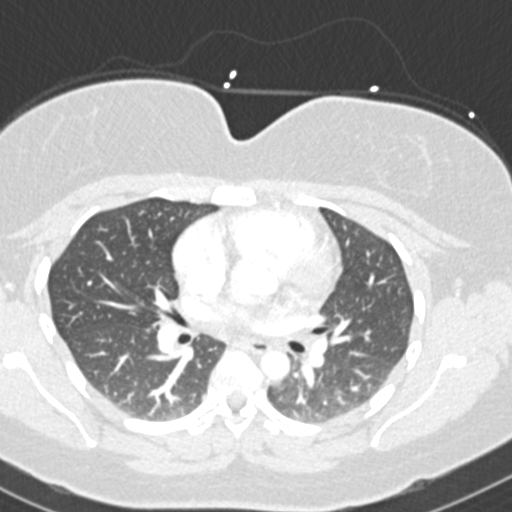
[im 158/251  mediastinal]
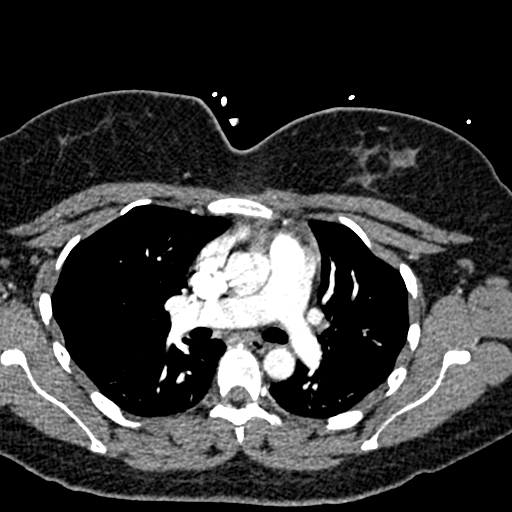
[im 172/251  lung]
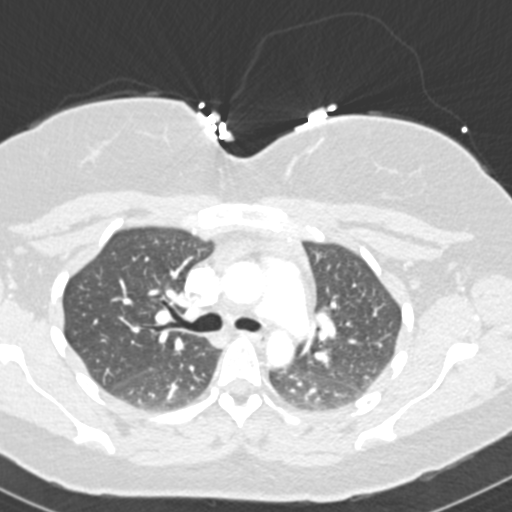
[im 185/251  mediastinal]
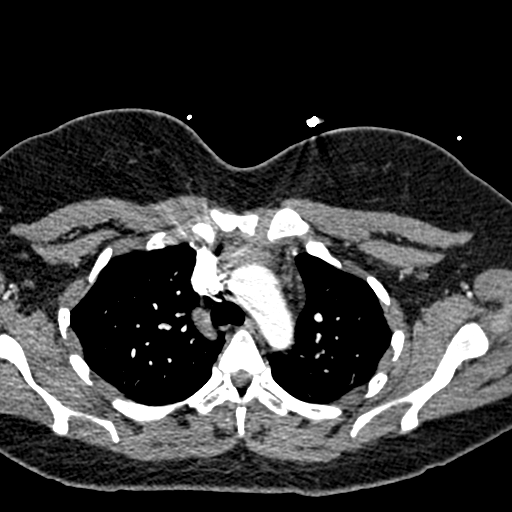
[im 211/251  lung]
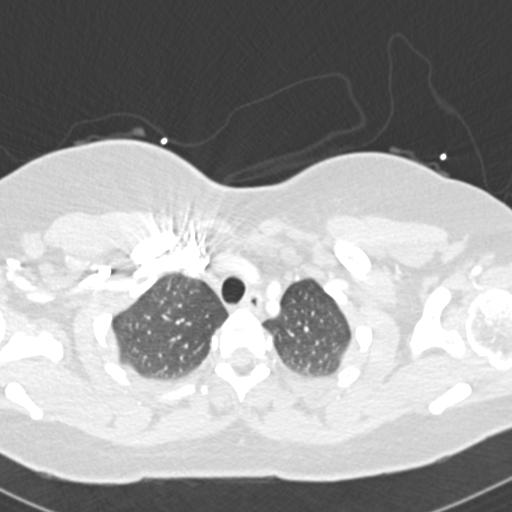
[im 224/251  mediastinal]
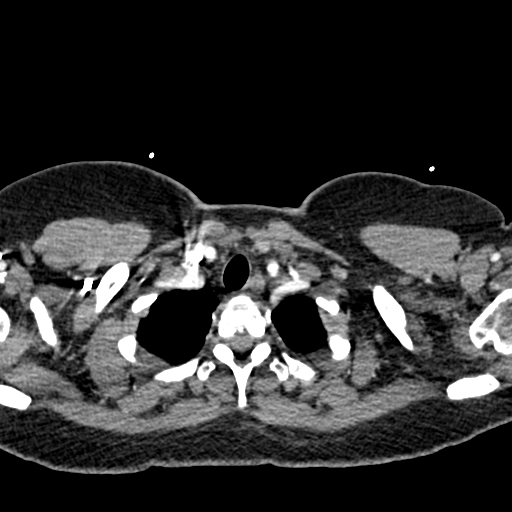
[im 237/251  lung]
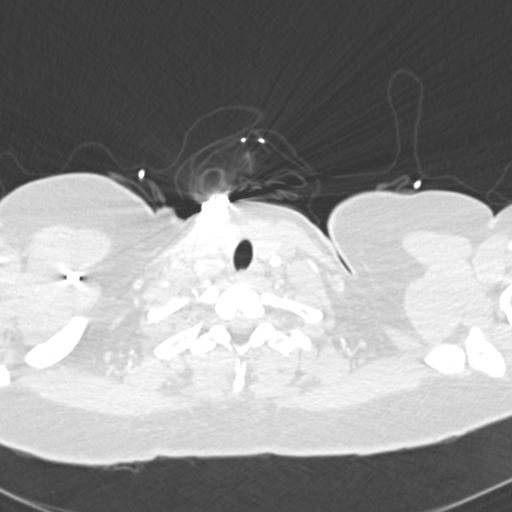

[Series 8: pe coronal mpr · coronal · 0.55mm/px · 3 of 113 slices shown]
[im 23/113  mediastinal]
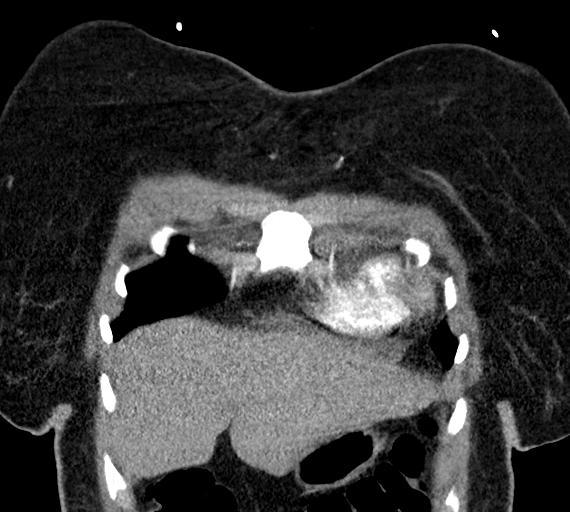
[im 45/113  mediastinal]
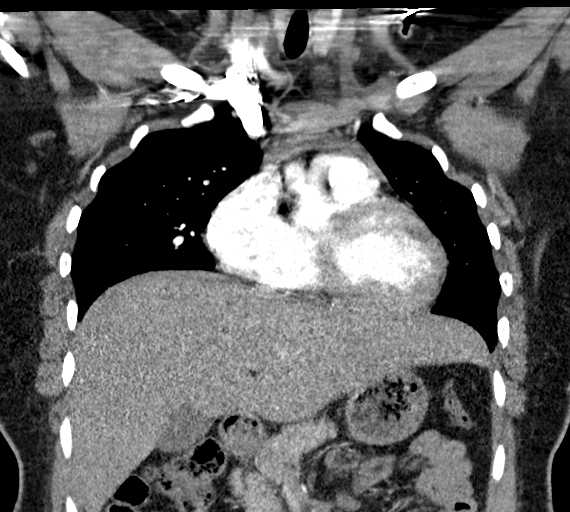
[im 68/113  mediastinal]
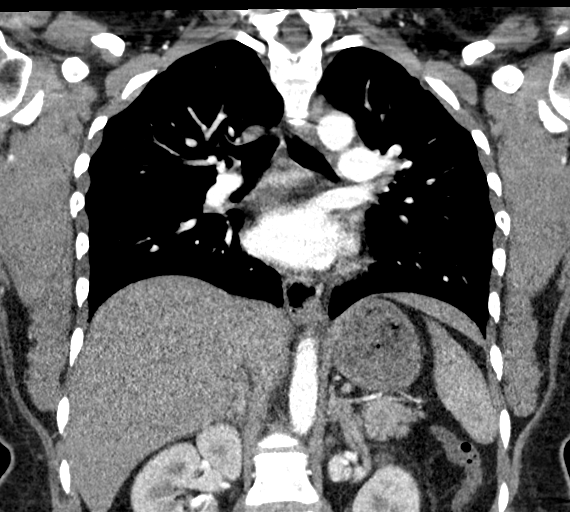

[18 of 37 positions shown; findings below may reference images not displayed]

FINDINGS: Cardiovascular: There is no cardiomegaly or pericardial effusion.
The thoracic aorta is unremarkable. The origins of the great vessels
of the aortic arch appear patent. Evaluation of the pulmonary
arteries is limited due to respiratory motion artifact. No pulmonary
artery embolus identified.

Mediastinum/Nodes: There is no hilar adenopathy. Mildly enlarged
lymph node in the lower neck to the right of the trachea ([DATE])
measuring 12 mm in short axis. Clinical correlation and further
evaluation with ultrasound on a nonemergent/outpatient basis
recommended. The esophagus is grossly unremarkable. No mediastinal
fluid collection.

Lungs/Pleura: The lungs are clear. There is no pleural effusion
pneumothorax. Central airways are patent.

Upper Abdomen: No acute abnormality.

Musculoskeletal: There are sclerotic changes involving the body of
the sternum as well as sclerotic changes of T4 and T9 vertebra.
Additional smaller sclerotic changes involving T7 and L1 most
consistent with metastatic disease. Additional osseous metastasis
involving the clavicular heads and ribs. No acute osseous pathology.

Review of the MIP images confirms the above findings.
IMPRESSION: 1. No CT evidence of pulmonary embolism.
2. Osseous sclerotic metastatic disease. Correlation with history of
primary malignancy recommended.
3. Mildly enlarged lymph node in the lower neck to the right of the
trachea concerning for metastatic disease.

## 2020-06-22 IMAGING — DX DG CHEST 1V PORT
1 series · 1 of 1 positions shown · non-contrast
Comparison: None.

CLINICAL DATA: 24-year-old female with shortness of breath.

EXAM:
PORTABLE CHEST 1 VIEW

[chest ap]
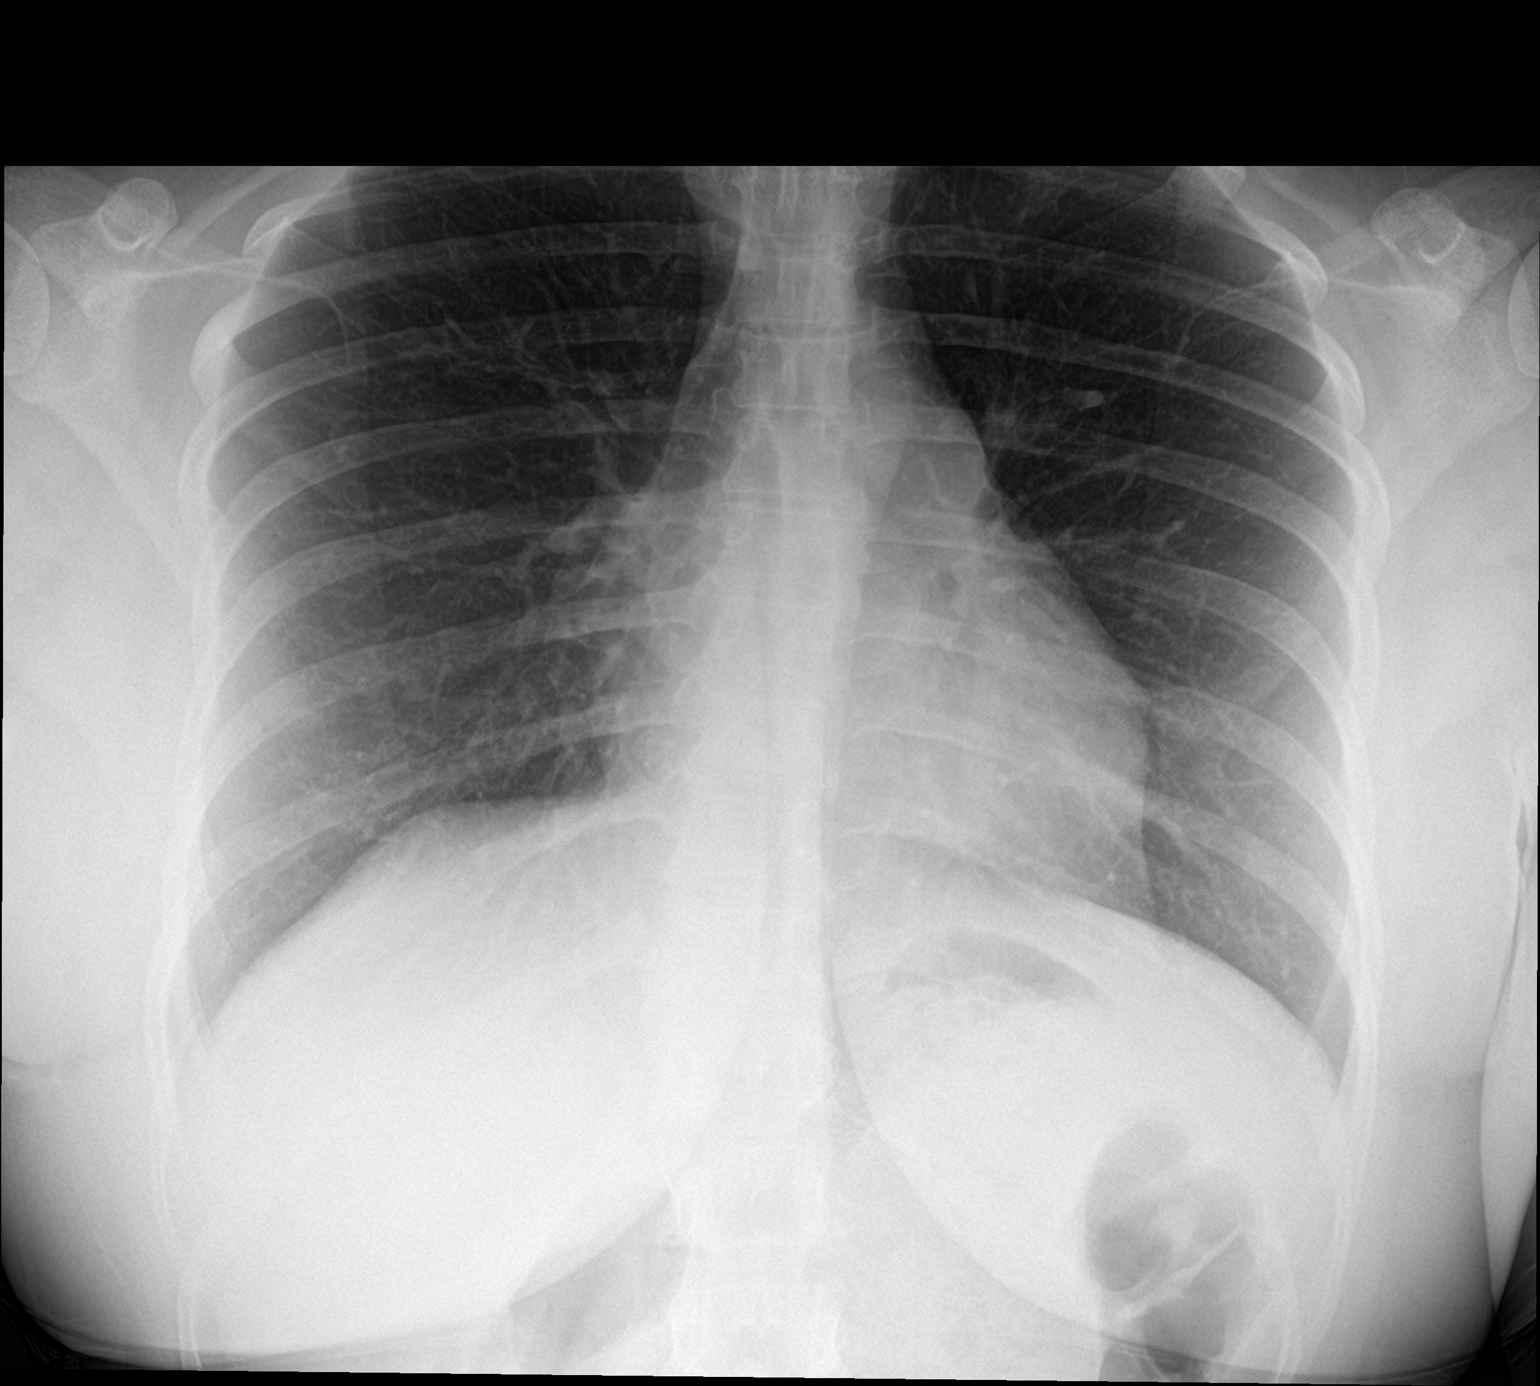

[1 of 1 positions shown; findings below may reference images not displayed]

FINDINGS: No focal consolidation, pleural effusion or pneumothorax. The
cardiac silhouette is within limits. No acute osseous pathology.
IMPRESSION: No active disease.

## 2020-06-22 MED ORDER — ONDANSETRON HCL 4 MG/2ML IJ SOLN
4.0000 mg | Freq: Once | INTRAMUSCULAR | Status: AC
  Administered 2020-06-22: 4 mg via INTRAVENOUS
  Filled 2020-06-22: qty 2

## 2020-06-22 MED ORDER — HYDROXYZINE HCL 25 MG PO TABS: 25.0000 mg | ORAL_TABLET | Freq: Four times a day (QID) | ORAL | 0 refills | Status: DC | PRN

## 2020-06-22 MED ORDER — HYDROXYZINE HCL 25 MG PO TABS
25.0000 mg | ORAL_TABLET | Freq: Once | ORAL | Status: AC
  Administered 2020-06-22: 25 mg via ORAL
  Filled 2020-06-22: qty 1

## 2020-06-22 MED ORDER — IOHEXOL 350 MG/ML SOLN
100.0000 mL | Freq: Once | INTRAVENOUS | Status: AC | PRN
  Administered 2020-06-22: 100 mL via INTRAVENOUS

## 2020-06-22 NOTE — Discharge Instructions (Addendum)
Please follow-up with oncology as discussed to discuss further testing.  The radiologist saw areas concerning for sclerotic metastatic disease in your sternum, thoracic vertebra among other places.  The underlying process is not certain at this time.  You will need additional testing.

## 2020-06-22 NOTE — ED Triage Notes (Signed)
C/o SOb, fatigue , generalized weakness x 1 month

## 2020-06-22 NOTE — ED Provider Notes (Signed)
[facility] EMERGENCY DEPARTMENT Provider Note   CSN: [id number] Arrival date & time: 06/22/20  1842     History Chief Complaint  Patient presents with  . Shortness of Breath    Jodi Norman is a 25 y.o. female.  Presents ER with concern for shortness of breath.  Patient reports for the past month she has felt very fatigued, short of breath, weak.  States that breathing is worse with exertion, difficulty going up flights of steps due to feeling winded.  Has occasional episodes of chest pain, none today.  Generally occur at rest, bleeding, lasting minutes, central, nonradiating.  No cough, fevers.  A couple weeks ago she did have some chills and night sweats. States she went to a provider at [facility] and they performed basic lab work which she was told was grossly normal except for a mildly low white blood cell count.  She denies any medical problems.  HPI     History reviewed. No pertinent past medical history.  There are no problems to display for this patient.   History reviewed. No pertinent surgical history.   OB History    Gravida  1   Para      Term      Preterm      AB      Living        SAB      IAB      Ectopic      Multiple      Live Births              Family History  Problem Relation Age of Onset  . Hyperlipidemia Mother   . Hypertension Mother   . Hyperlipidemia Father   . Hypertension Father   . Diabetes Father     Social History   Tobacco Use  . Smoking status: Never Smoker  . Smokeless tobacco: Never Used  Substance Use Topics  . Alcohol use: Never  . Drug use: Never    Home Medications Prior to Admission medications   Medication Sig Start Date End Date Taking? Authorizing Provider  hydrOXYzine (ATARAX/VISTARIL) 25 MG tablet Take 1 tablet (25 mg total) by mouth every 6 (six) hours as needed. 06/22/20  Yes Lucrezia Starch, MD  benzonatate (TESSALON) 100 MG capsule Take 1 capsule (100 mg total) by mouth  every 8 (eight) hours. 01/13/20   Walisiewicz, Verline Lema E, PA-C  clonazePAM (KLONOPIN) 0.25 MG disintegrating tablet Take 0.25 mg by mouth daily. 06/16/20   [provider]  ibuprofen (ADVIL) 800 MG tablet Take 1 tablet (800 mg total) by mouth 3 (three) times daily as needed. 01/13/20   Walisiewicz, Verline Lema E, PA-C  lidocaine (XYLOCAINE) 2 % solution Use as directed 15 mLs in the mouth or throat as needed for mouth pain. 01/13/20   Barrie Folk, PA-C    Allergies    Patient has no known allergies.  Review of Systems   Review of Systems  Constitutional: Positive for chills and fatigue. Negative for fever.  HENT: Negative for ear pain and sore throat.   Eyes: Negative for pain and visual disturbance.  Respiratory: Positive for shortness of breath. Negative for cough.   Cardiovascular: Positive for chest pain. Negative for palpitations.  Gastrointestinal: Negative for abdominal pain and vomiting.  Genitourinary: Negative for dysuria and hematuria.  Musculoskeletal: Negative for arthralgias and back pain.  Skin: Negative for color change and rash.  Neurological: Negative for seizures and syncope.  All other systems  reviewed and are negative.   Physical Exam Updated Vital Signs BP 136/74 (BP Location: Right Arm)   Pulse 86   Temp 98.7 F (37.1 C) (Oral)   Resp 14   Ht 5\' 7"  (1.702 m)   Wt 92.1 kg   LMP 06/13/2020   SpO2 100%   BMI 31.79 kg/m   Physical Exam Vitals and nursing note reviewed.  Constitutional:      General: She is not in acute distress.    Appearance: She is well-developed and well-nourished.  HENT:     Head: Normocephalic and atraumatic.  Eyes:     Conjunctiva/sclera: Conjunctivae normal.  Cardiovascular:     Rate and Rhythm: Normal rate and regular rhythm.     Heart sounds: No murmur heard.   Pulmonary:     Effort: Pulmonary effort is normal. No respiratory distress.     Breath sounds: Normal breath sounds.  Abdominal:     Palpations:  Abdomen is soft.     Tenderness: There is no abdominal tenderness.  Musculoskeletal:        General: No edema.     Cervical back: Neck supple.  Skin:    General: Skin is warm and dry.  Neurological:     General: No focal deficit present.     Mental Status: She is alert.  Psychiatric:        Mood and Affect: Mood and affect normal.     ED Results / Procedures / Treatments   Labs (all labs ordered are listed, but only abnormal results are displayed) Labs Reviewed  COMPREHENSIVE METABOLIC PANEL - Abnormal; Notable for the following components:      Result Value   Potassium 3.2 (*)    CO2 21 (*)    Glucose, Bld 137 (*)    Total Protein 8.3 (*)    Total Bilirubin 0.2 (*)    All other components within normal limits  D-DIMER, QUANTITATIVE (NOT AT [facility]) - Abnormal; Notable for the following components:   D-Dimer, Quant 0.83 (*)    All other components within normal limits  SARS CORONAVIRUS 2 (TAT 6-24 HRS)  CBC WITH DIFFERENTIAL/PLATELET  HCG, SERUM, QUALITATIVE  URINALYSIS, ROUTINE W REFLEX MICROSCOPIC  TSH  TROPONIN I (HIGH SENSITIVITY)    EKG None  Radiology CT Angio Chest PE W and/or Wo Contrast  Result Date: 06/22/2020 CLINICAL DATA:  25 year old female with chest pain and shortness of breath. EXAM: CT ANGIOGRAPHY CHEST WITH CONTRAST TECHNIQUE: Multidetector CT imaging of the chest was performed using the standard protocol during bolus administration of intravenous contrast. Multiplanar CT image reconstructions and MIPs were obtained to evaluate the vascular anatomy. CONTRAST:  148mL OMNIPAQUE IOHEXOL 350 MG/ML SOLN COMPARISON:  Chest radiograph dated 06/22/2020. FINDINGS: Cardiovascular: There is no cardiomegaly or pericardial effusion. The thoracic aorta is unremarkable. The origins of the great vessels of the aortic arch appear patent. Evaluation of the pulmonary arteries is limited due to respiratory motion artifact. No pulmonary artery embolus identified.  Mediastinum/Nodes: There is no hilar adenopathy. Mildly enlarged lymph node in the lower neck to the right of the trachea (17/7) measuring 12 mm in short axis. Clinical correlation and further evaluation with ultrasound on a nonemergent/outpatient basis recommended. The esophagus is grossly unremarkable. No mediastinal fluid collection. Lungs/Pleura: The lungs are clear. There is no pleural effusion pneumothorax. Central airways are patent. Upper Abdomen: No acute abnormality. Musculoskeletal: There are sclerotic changes involving the body of the sternum as well as sclerotic changes of T4 and T9  vertebra. Additional smaller sclerotic changes involving T7 and L1 most consistent with metastatic disease. Additional osseous metastasis involving the clavicular heads and ribs. No acute osseous pathology. Review of the MIP images confirms the above findings. IMPRESSION: 1. No CT evidence of pulmonary embolism. 2. Osseous sclerotic metastatic disease. Correlation with history of primary malignancy recommended. 3. Mildly enlarged lymph node in the lower neck to the right of the trachea concerning for metastatic disease. Electronically Signed   By: Anner Crete M.D.   On: 06/22/2020 23:20   DG Chest Portable 1 View  Result Date: 06/22/2020 CLINICAL DATA:  25 year old female with shortness of breath. EXAM: PORTABLE CHEST 1 VIEW COMPARISON:  None. FINDINGS: No focal consolidation, pleural effusion or pneumothorax. The cardiac silhouette is within limits. No acute osseous pathology. IMPRESSION: No active disease. Electronically Signed   By: Anner Crete M.D.   On: 06/22/2020 19:09    Procedures Procedures   Medications Ordered in ED Medications  hydrOXYzine (ATARAX/VISTARIL) tablet 25 mg (25 mg Oral Given 06/22/20 2155)  iohexol (OMNIPAQUE) 350 MG/ML injection 100 mL (100 mLs Intravenous Contrast Given 06/22/20 2246)  ondansetron (ZOFRAN) injection 4 mg (4 mg Intravenous Given 06/22/20 2322)    ED Course   I have reviewed the triage vital signs and the nursing notes.  Pertinent labs & imaging results that were available during my care of the patient were reviewed by me and considered in my medical decision making (see chart for details).    MDM Rules/Calculators/A&P                         25 year old lady presenting to the emergency room with concern for generalized fatigue, weakness, shortness of breath.  On physical exam, she is noted to be well-appearing in no acute distress.  Initial vitals stable except for mild tachycardia which resolved with observation.  Initial basic labs were grossly within normal limits.  Will send Covid testing.  D-dimer mildly elevated, will check CT PE study.  The CT scan was negative for acute pulmonary embolism however.  CT was concerning for diffuse sclerotic lesions raising suspicion for metastatic disease.  Primary source is uncertain at this time.  I discussed this news with patient in detail as well as her mother via phone.  Reviewed case with on-call oncology Dr. Chryl Heck.  She recommends outpatient oncology referral.  Patient demonstrated understanding of these findings and need for close outpatient follow-up.  Vital signs remained stable and well-appearing, do not see medication for admission at this time.  She was discharged home.   After the discussed management above, the patient was determined to be safe for discharge.  The patient was in agreement with this plan and all questions regarding their care were answered.  ED return precautions were discussed and the patient will return to the ED with any significant worsening of condition.    Final Clinical Impression(s) / ED Diagnoses Final diagnoses:  Bony sclerosis  Fatigue, unspecified type    Rx / DC Orders ED Discharge Orders         Ordered    Ambulatory referral to Oncology       Comments: Osseous sclerotic metastatic disease on CT scan - unknown primary   06/22/20 2354    hydrOXYzine  (ATARAX/VISTARIL) 25 MG tablet  Every 6 hours PRN        06/22/20 2356           Lucrezia Starch, MD 06/23/20 0001

## 2020-06-23 ENCOUNTER — Other Ambulatory Visit: Payer: Self-pay | Admitting: Family

## 2020-06-23 ENCOUNTER — Encounter: Payer: Self-pay | Admitting: *Deleted

## 2020-06-23 DIAGNOSIS — R519 Headache, unspecified: Secondary | ICD-10-CM

## 2020-06-23 DIAGNOSIS — R42 Dizziness and giddiness: Secondary | ICD-10-CM

## 2020-06-23 DIAGNOSIS — C7951 Secondary malignant neoplasm of bone: Secondary | ICD-10-CM

## 2020-06-23 LAB — SARS CORONAVIRUS 2 (TAT 6-24 HRS): SARS Coronavirus 2: POSITIVE — AB

## 2020-06-23 LAB — TSH: TSH: 1.382 u[IU]/mL (ref 0.350–4.500)

## 2020-06-23 NOTE — Progress Notes (Signed)
Received new patient referral for this patient who was seen yesterday in the ED and discovered to have bony lesions suspicious for metastatic disease. She also received a diagnosis of covid.  Reviewed case with Dr Marin Olp. Patient will need a biopsy, but to determine optimal location, she will need a PET first. Due to positive covid status, first available PET would be 2/10.   Called and spoke to patient at length. She is very frightened at the possible outcomes and expresses severe anxiety. Actively listened to patient and allowed her to express her thoughts/feelings. Tried to reassure her and give her expectations for the timeline and what steps needed to happen to get the answers we needed. Per patient, she was unaware that she had a positive covid test. Also reviewed home covid care and to seek emergent assessment if she began to deteriorate. At this time, she stated she had a one month history of blurred vision, headaches and dizziness.   Patient is a single mom of one 2yo daughter. She doesn't have family in the area. They are all in [place], however they have been made aware of the situation. Her child's father is in the [employer] and unavailable at this time.   Reviewed these symptoms with Dr Marin Olp. An order for MRI of the brain will also be placed. Spoke to radiology department and they agreed to scan patient this weekend despite covid status as patient was free of fever. Reviewed this new order with the patient. She believes she may need something to help her tolerate the scan as she's anxious, and her breathing is more difficult when lying down.   Due to covid status, we will have a virtual visit with the patient tomorrow where Dr Marin Olp will speak to her over the phone. Patient is aware of appointment time.   Oncology Nurse Navigator Documentation  Oncology Nurse Navigator Flowsheets 06/23/2020  Abnormal Finding Date 06/22/2020  Diagnosis Status Additional Work Up  Navigator Follow Up Date:  06/24/2020  Navigator Follow Up Reason: New Patient Appointment  Navigator Location [facility]  Referral Date to RadOnc/MedOnc 06/23/2020  Navigator Encounter Type Introductory Phone Call  Patient Visit Type MedOnc  Treatment Phase Abnormal Scans  Barriers/Navigation Needs Anxiety;Coordination of Care;Education;Personal Conflicts  Education Preparing for Upcoming Surgery/ Treatment;Pain/ Symptom Management;Newly Diagnosed Cancer Education  Interventions Coordination of Care;Education;Psycho-Social Support  Acuity Level 4-High Needs (Greater Than 4 Barriers Identified)  Coordination of Care Appts;Radiology  Education Method Verbal  Support Groups/Services Friends and Family  Time Spent with Patient > 120

## 2020-06-23 NOTE — ED Notes (Signed)
Patient verbalizes understanding of discharge instructions. Opportunity for questioning and answers were provided. Armband removed by staff, pt discharged from ED ambulatory to home.  

## 2020-06-24 ENCOUNTER — Inpatient Hospital Stay: Payer: Medicaid Other | Attending: Hematology & Oncology | Admitting: Hematology & Oncology

## 2020-06-24 ENCOUNTER — Telehealth: Payer: Self-pay

## 2020-06-24 ENCOUNTER — Encounter: Payer: Self-pay | Admitting: Hematology & Oncology

## 2020-06-24 ENCOUNTER — Encounter: Payer: Self-pay | Admitting: *Deleted

## 2020-06-24 ENCOUNTER — Other Ambulatory Visit: Payer: Self-pay

## 2020-06-24 ENCOUNTER — Other Ambulatory Visit: Payer: Self-pay | Admitting: *Deleted

## 2020-06-24 DIAGNOSIS — H538 Other visual disturbances: Secondary | ICD-10-CM | POA: Insufficient documentation

## 2020-06-24 DIAGNOSIS — F419 Anxiety disorder, unspecified: Secondary | ICD-10-CM | POA: Insufficient documentation

## 2020-06-24 DIAGNOSIS — Z79899 Other long term (current) drug therapy: Secondary | ICD-10-CM | POA: Diagnosis not present

## 2020-06-24 DIAGNOSIS — U071 COVID-19: Secondary | ICD-10-CM | POA: Insufficient documentation

## 2020-06-24 DIAGNOSIS — F411 Generalized anxiety disorder: Secondary | ICD-10-CM | POA: Insufficient documentation

## 2020-06-24 DIAGNOSIS — R519 Headache, unspecified: Secondary | ICD-10-CM | POA: Diagnosis not present

## 2020-06-24 DIAGNOSIS — F1290 Cannabis use, unspecified, uncomplicated: Secondary | ICD-10-CM | POA: Diagnosis not present

## 2020-06-24 DIAGNOSIS — M899 Disorder of bone, unspecified: Secondary | ICD-10-CM | POA: Insufficient documentation

## 2020-06-24 HISTORY — DX: Generalized anxiety disorder: F41.1

## 2020-06-24 MED ORDER — CLONAZEPAM 0.25 MG PO TBDP: 0.2500 mg | ORAL_TABLET | Freq: Every day | ORAL | 0 refills | Status: DC

## 2020-06-24 MED ORDER — CLONAZEPAM 0.5 MG PO TABS: 0.2500 mg | ORAL_TABLET | Freq: Every day | ORAL | 0 refills | Status: DC | PRN

## 2020-06-24 NOTE — Progress Notes (Signed)
Called patient's insurance to obtain authorization for patient's scheduled MRI tomorrow. Spoke to the reviewing RN and was able to get the following authorization:  [id number] exp 08/22/2020  Patient had her virtual appointment with Dr Marin Olp. There is no change in the plan of care. Will obtain the MRI tomorrow and follow up with a PET scan.   Spoke with patient after her appointment. At this time she has no needs. She knows we will call her next week with MRI results.   Oncology Nurse Navigator Documentation  Oncology Nurse Navigator Flowsheets 06/24/2020  Abnormal Finding Date -  Diagnosis Status -  Navigator Follow Up Date: 06/27/2020  Navigator Follow Up Reason: Scan Review  Navigator Location [facility]  Referral Date to RadOnc/MedOnc -  Navigator Encounter Type Telephone;Other:  Telephone Outgoing Call  Patient Visit Type MedOnc  Treatment Phase Abnormal Scans  Barriers/Navigation Needs Anxiety;Coordination of Care;Education;Personal Conflicts  Education -  Interventions Coordination of Care;Education;Psycho-Social Support  Acuity Level 4-High Needs (Greater Than 4 Barriers Identified)  Coordination of Care Other  Education Method -  Support Groups/Services Friends and Family  Time Spent with Patient 51

## 2020-06-24 NOTE — Progress Notes (Signed)
Referral MD  Reason for Referral: Abnormal CT scan with sclerotic spine lesions.  No chief complaint on file. : I was told that I may have cancer.  HPI: Jodi Norman is very nice.  This is a phone visit.  She is Covid positive.  As such, she do not come into the office for a consultation.  She is a nice 25 year old.  She is a 64-year-old daughter.  She is not been feeling well.  She has felt very tired.  She has have a lot of anxiety.  She has had 2 however her daughter go to her mom's house.  She ultimately had to go to the emergency room.  She went to the emergency room on January 26.  She again, tested for positive Covid.  She did have a CT angiogram done.  This did not show a pulmonary embolism.  However, there is some sclerotic lesions in the spine at T4 and T9.  Also noted was some changes at T7 and L1.  The radiologist felt this was was "consistent with metastatic disease."  According to Jodi Norman, she said that her mother and sister have been told that they have similar lesions.  She was not complain of any back discomfort.  She has been no change in bowel or bladder habits.  She has normal monthly cycles.  She has not lost weight.  She says her appetite is very poor.  She really does not want to eat.  Her labs looked okay when she went to the emergency room.    She has been complained of headaches and blurred vision.  We have been able to get a MRI of the brain over this weekend.  She has had no rashes.  There is been no swollen lymph nodes.    She has a ton of anxiety.  She says she had a cut her hair because it was just too tight.  She does not smoke cigarettes.  She does smoke marijuana.  She really does not have much in way of alcohol use.  There is no history of cancer in the family.  Overall, it sounds like she has been fairly active.      Past Medical History:  Diagnosis Date  . Generalized anxiety disorder 06/24/2020  :  No past surgical history on  file.:   Current Outpatient Medications:  .  benzonatate (TESSALON) 100 MG capsule, Take 1 capsule (100 mg total) by mouth every 8 (eight) hours., Disp: 21 capsule, Rfl: 0 .  clonazePAM (KLONOPIN) 0.5 MG tablet, Take 0.5 tablets (0.25 mg total) by mouth daily as needed for anxiety., Disp: 30 tablet, Rfl: 0 .  hydrOXYzine (ATARAX/VISTARIL) 25 MG tablet, Take 1 tablet (25 mg total) by mouth every 6 (six) hours as needed., Disp: 20 tablet, Rfl: 0 .  ibuprofen (ADVIL) 800 MG tablet, Take 1 tablet (800 mg total) by mouth 3 (three) times daily as needed., Disp: 21 tablet, Rfl: 0 .  lidocaine (XYLOCAINE) 2 % solution, Use as directed 15 mLs in the mouth or throat as needed for mouth pain., Disp: 100 mL, Rfl: 0:  :  No Known Allergies:  Family History  Problem Relation Age of Onset  . Hyperlipidemia Mother   . Hypertension Mother   . Hyperlipidemia Father   . Hypertension Father   . Diabetes Father   :  Social History   Socioeconomic History  . Marital status: Single    Spouse name: Not on file  . Number of children: Not  on file  . Years of education: Not on file  . Highest education level: Not on file  Occupational History  . Not on file  Tobacco Use  . Smoking status: Never Smoker  . Smokeless tobacco: Never Used  Substance and Sexual Activity  . Alcohol use: Never  . Drug use: Never  . Sexual activity: Not Currently    Birth control/protection: None  Other Topics Concern  . Not on file  Social History Narrative  . Not on file   Social Determinants of Health   Financial Resource Strain: Not on file  Food Insecurity: Not on file  Transportation Needs: Not on file  Physical Activity: Not on file  Stress: Not on file  Social Connections: Not on file  Intimate Partner Violence: Not on file  : Review of Systems  Constitutional: Positive for malaise/fatigue.  HENT: Negative.   Eyes: Positive for blurred vision.  Respiratory: Negative.   Cardiovascular: Negative.    Gastrointestinal: Negative.   Genitourinary: Negative.   Musculoskeletal: Negative.   Skin: Negative.   Neurological: Positive for headaches.  Endo/Heme/Allergies: Negative.   Psychiatric/Behavioral: The patient is nervous/anxious.      Exam: Not done because this was a telephone visit. @IPVITALS @   Recent Labs    06/22/20 1857  WBC 6.6  HGB 12.5  HCT 37.2  PLT 338   Recent Labs    06/22/20 1857  NA 137  K 3.2*  CL 102  CO2 21*  GLUCOSE 137*  BUN 8  CREATININE 0.68  CALCIUM 9.2    Blood smear review: None  Pathology: None    Assessment and Plan: Jodi Norman is a very nice 25 year old female.  She went to the emergency room.  She had a CT angiogram done.  There is no thromboembolic disease.  However, there was a mention of sclerotic lesions on her spine.  In addition, there was mention of a enlarged lymph node in the lower neck adjacent to the trachea on the right.  I have no idea what this really indicates.  It is really hard to say whether or not she has a malignancy.  I cannot imagine that a 25 year old would have a malignancy outside of lymphoma or Hodgkin's disease.  We really going to have to get a biopsy possibly.  We will have to see what her MRI of the brain shows.  I, again, cannot imagine that this is to be cancer.  Maybe she has Paget's disease of the spine.  It is interesting that she says that her mother and sister have been told to have something similar.  We will just have to await for the COVID's quarantine to go over.  Once she can come back into the office, we will see her in the office.  She sounded very nice on the phone.  I try to reassure her.  I did call in clonidine for her to try to help with her anxiety.  She had gotten some from the emergency room I think.

## 2020-06-24 NOTE — Telephone Encounter (Signed)
Called to discuss with patient about COVID-19 symptoms and the use of one of the available treatments for those with mild to moderate Covid symptoms and at a high risk of hospitalization.  Pt appears to qualify for outpatient treatment due to co-morbid conditions and/or a member of an at-risk group in accordance with the FDA Emergency Use Authorization.    Symptom onset: 1 month ago Vaccinated: No Booster? No Immunocompromised? No Qualifiers: Pt. States she "might have cancer."  Pt. Declines any further treatment. States "Dr. Marin Olp is handling my care."   Marcello Moores

## 2020-06-25 ENCOUNTER — Other Ambulatory Visit: Payer: Self-pay

## 2020-06-25 ENCOUNTER — Ambulatory Visit (HOSPITAL_BASED_OUTPATIENT_CLINIC_OR_DEPARTMENT_OTHER)
Admission: RE | Admit: 2020-06-25 | Discharge: 2020-06-25 | Disposition: A | Discharge status: Home / Self Care | Payer: Medicaid Other | Source: Ambulatory Visit | Attending: Family | Admitting: Family

## 2020-06-25 DIAGNOSIS — C7951 Secondary malignant neoplasm of bone: Secondary | ICD-10-CM | POA: Insufficient documentation

## 2020-06-25 DIAGNOSIS — R519 Headache, unspecified: Secondary | ICD-10-CM | POA: Diagnosis present

## 2020-06-25 DIAGNOSIS — R42 Dizziness and giddiness: Secondary | ICD-10-CM | POA: Diagnosis present

## 2020-06-25 IMAGING — MR MR HEAD WO/W CM
12 of 14 series · 28 of 48 positions shown · IV contrast (gadavist)
Comparison: None.

CLINICAL DATA: Dizziness and headache.  Rule out tumor.

EXAM:
MRI HEAD WITHOUT AND WITH CONTRAST
TECHNIQUE: Multiplanar, multiecho pulse sequences of the brain and surrounding
structures were obtained without and with intravenous contrast.
CONTRAST:  9mL GADAVIST GADOBUTROL 1 MMOL/ML IV SOLN

[Series 2: T1 · sagittal · 5.0mm · 0.45mm/px · 2 of 23 slices shown]
[im 1/23]
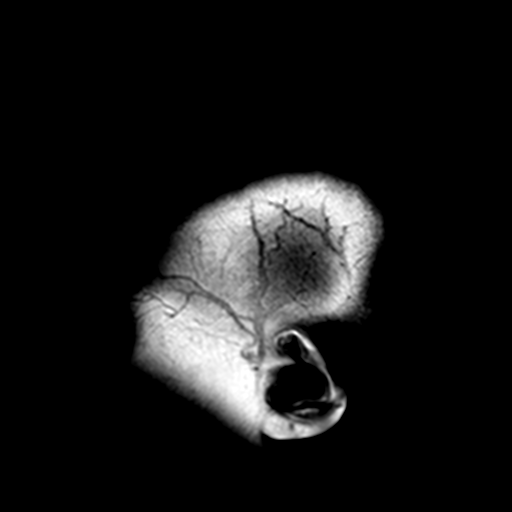
[im 23/23]
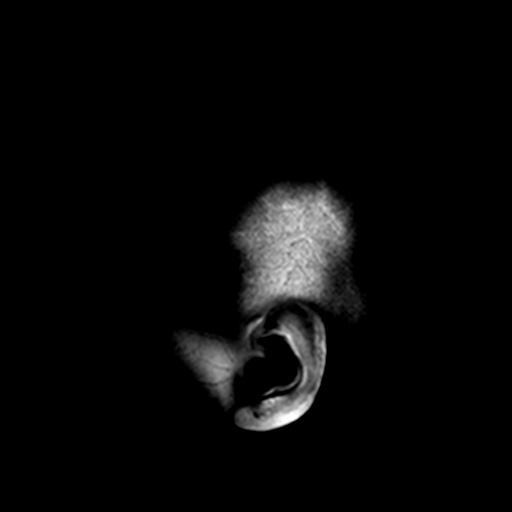

[Series 3: DWI · axial · 3.0mm · 2.19mm/px · z∈[-72,+81]mm · 6 of 104 slices shown (1 of 4)]
[im 1/104]
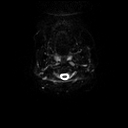
[im 21/104]
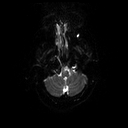
[im 42/104]
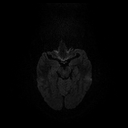
[im 62/104]
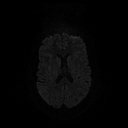
[im 83/104]
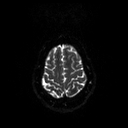
[im 104/104]
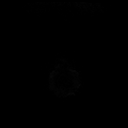

[Series 4: DWI · axial · 3.0mm · 2.19mm/px · z∈[-72,+81]mm · 3 of 52 slices shown (2 of 4)]
[im 1/52]
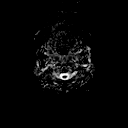
[im 26/52]
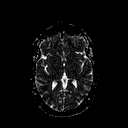
[im 52/52]
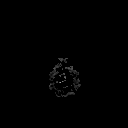

[Series 5: DWI · coronal · 5.0mm · 1.46mm/px · 5 of 76 slices shown (3 of 4)]
[im 1/76]
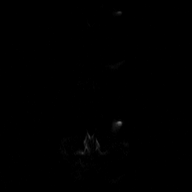
[im 19/76]
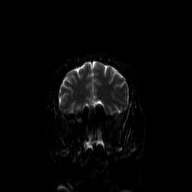
[im 38/76]
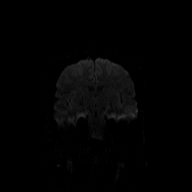
[im 57/76]
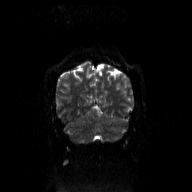
[im 76/76]
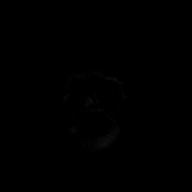

[Series 6: DWI · coronal · 5.0mm · 1.46mm/px · 2 of 38 slices shown (4 of 4)]
[im 1/38]
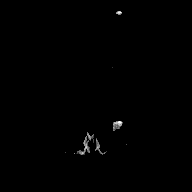
[im 38/38]
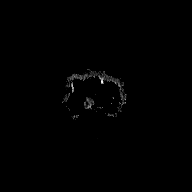

[Series 7: T2 · axial · 5.0mm · 0.72mm/px · 1 of 24 slices shown (1 of 2)]
[im 1/24]
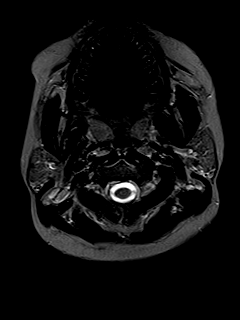

[Series 8: FLAIR · axial · 3.0mm · 0.90mm/px · z∈[-69,+87]mm · 2 of 40 slices shown]
[im 1/40]
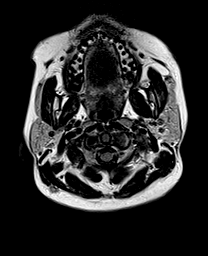
[im 40/40]
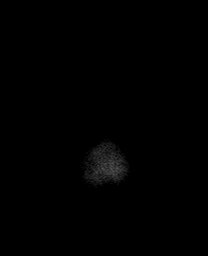

[Series 9: T2 · axial · 5.0mm · 0.45mm/px · 1 of 24 slices shown (2 of 2)]
[im 1/24]
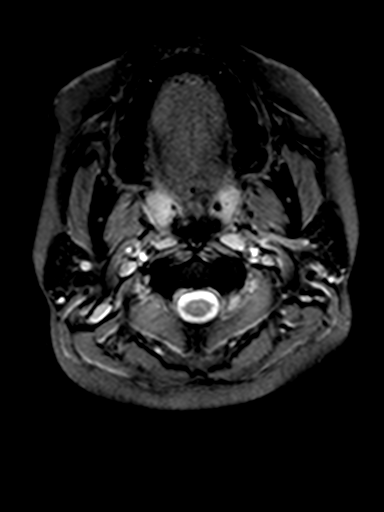

[Series 11: T2 post-contrast · coronal · 5.0mm · 0.45mm/px · 2 of 30 slices shown]
[im 1/30]
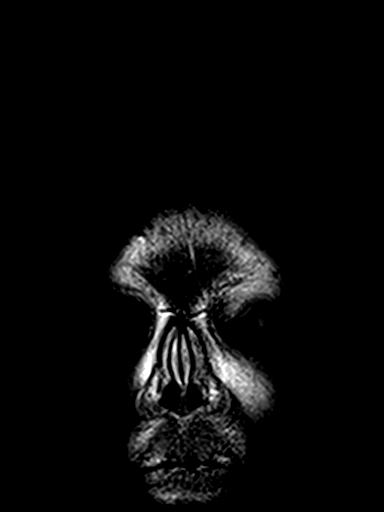
[im 30/30]
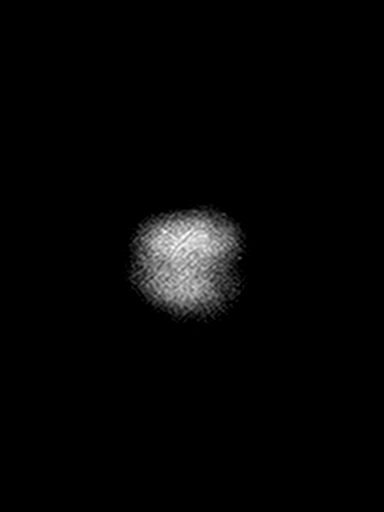

[Series 13: T1 post-contrast · coronal · 5.0mm · 0.45mm/px · 2 of 30 slices shown (1 of 2)]
[im 1/30]
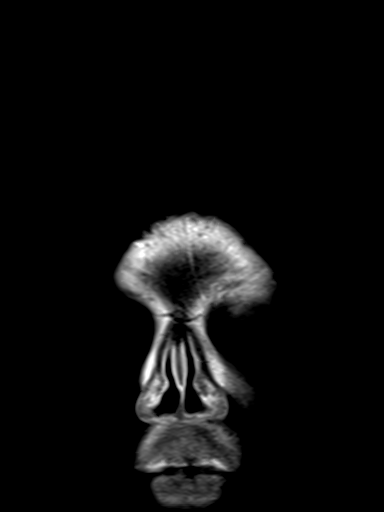
[im 30/30]
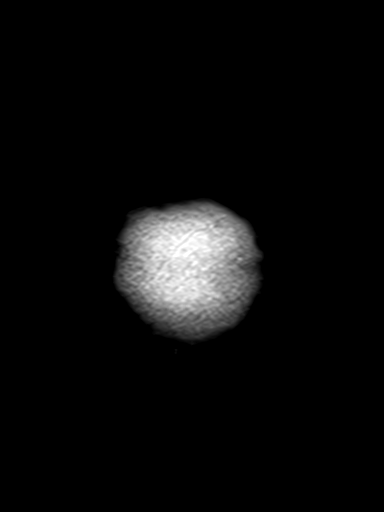

[Series 14: T1 post-contrast · sagittal · 5.0mm · 0.45mm/px · 1 of 23 slices shown (2 of 2)]
[im 1/23]
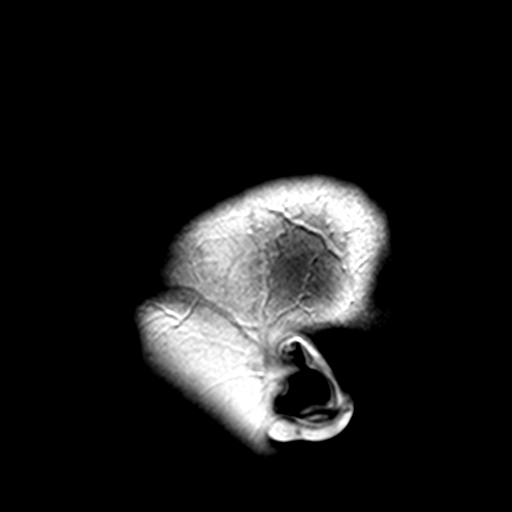

[Series 100: hx · axial · 10.0mm · 0.55mm/px · 1 of 15 slices shown]
[im 1/15]
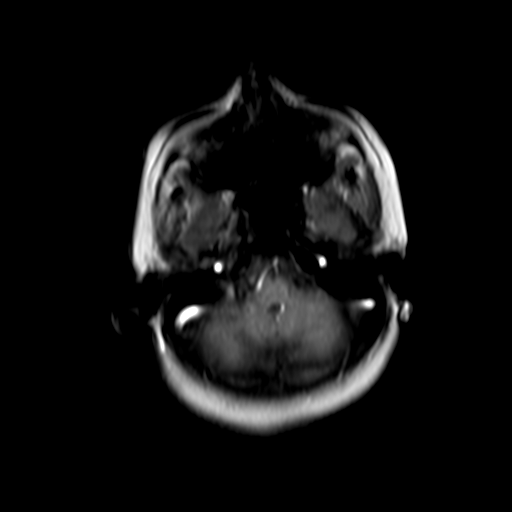

[28 of 48 positions shown; findings below may reference images not displayed]

FINDINGS: Brain: No acute infarction, hemorrhage, hydrocephalus, extra-axial
collection or mass lesion.

Mild white matter changes in the frontal lobes left greater than
right. Lesions are in the deep white matter. Sparing of the
periventricular white matter. Brainstem and cerebellum normal.

No enhancing metastatic deposits identified in the brain.

Vascular: Normal arterial flow voids

Skull and upper cervical spine: No focal skeletal lesion identified

Sinuses/Orbits: Mild mucosal edema paranasal sinuses. Negative orbit

Other: None
IMPRESSION: Negative for metastatic disease to the brain.  No acute abnormality

Mild white matter changes which may be due to chronic ischemia,
complex migraine headache, or possibly demyelinating disease.

## 2020-06-25 MED ORDER — GADOBUTROL 1 MMOL/ML IV SOLN
9.0000 mL | Freq: Once | INTRAVENOUS | Status: AC | PRN
  Administered 2020-06-25: 9 mL via INTRAVENOUS

## 2020-06-27 ENCOUNTER — Telehealth: Payer: Self-pay

## 2020-06-27 ENCOUNTER — Encounter: Payer: Self-pay | Admitting: *Deleted

## 2020-06-27 NOTE — Progress Notes (Signed)
Reviewed results of patients MRI with Dr Marin Olp. Called patient and reviewed results with her. MRI did show changes which may be related to chronic migraines, which patient states she has often. She asks about prescription medication. Patient does not have a PCP. She has made a few phone calls to get established but is having trouble finding an office that takes her insurance, and that can see her before March.  I have sent a message to the office director in the primary care office downstairs to see if they can work patient in to be seen sooner.   Patient needs PET scan. Scheduled for 07/06/2020. Reviewed this appointment with the patient. Also included PET prep including NPO status.   Will continue to follow to ensure patient get into a PCP office.   Oncology Nurse Navigator Documentation  Oncology Nurse Navigator Flowsheets 06/27/2020  Abnormal Finding Date -  Diagnosis Status -  Navigator Follow Up Date: 07/06/2020  Navigator Follow Up Reason: Scan Review  Navigator Location [facility]  Referral Date to RadOnc/MedOnc -  Navigator Encounter Type Diagnostic Results;Telephone  Telephone Diagnostic Results;Outgoing Call  Patient Visit Type MedOnc  Treatment Phase Abnormal Scans  Barriers/Navigation Needs Anxiety;Coordination of Care;Education;Personal Conflicts  Education Other  Interventions Coordination of Care;Education;Psycho-Social Support  Acuity Level 4-High Needs (Greater Than 4 Barriers Identified)  Coordination of Care Other  Education Method Verbal  Support Groups/Services Friends and Family  Time Spent with Patient 8

## 2020-06-27 NOTE — Telephone Encounter (Signed)
No 06/24/20 los     Coltrane Tugwell 

## 2020-06-29 ENCOUNTER — Encounter: Payer: Self-pay | Admitting: *Deleted

## 2020-06-29 NOTE — Progress Notes (Signed)
Patient has been accepted into the Primary Care Office downstairs. She has a new patient appointment for 07/08/2020.   Oncology Nurse Navigator Documentation  Oncology Nurse Navigator Flowsheets 06/29/2020  Abnormal Finding Date -  Diagnosis Status -  Navigator Follow Up Date: 07/06/2020  Navigator Follow Up Reason: Scan Review  Navigator Location [facility]  Referral Date to RadOnc/MedOnc -  Navigator Encounter Type Appt/Treatment Plan Review  Telephone -  Patient Visit Type MedOnc  Treatment Phase Abnormal Scans  Barriers/Navigation Needs Anxiety;Coordination of Care;Education;Personal Conflicts  Education -  Interventions None Required  Acuity Level 4-High Needs (Greater Than 4 Barriers Identified)  Coordination of Care -  Education Method -  Support Groups/Services Friends and Family  Time Spent with Patient 15

## 2020-07-05 ENCOUNTER — Other Ambulatory Visit: Payer: Self-pay | Admitting: Family

## 2020-07-05 ENCOUNTER — Encounter: Payer: Self-pay | Admitting: *Deleted

## 2020-07-05 DIAGNOSIS — C7951 Secondary malignant neoplasm of bone: Secondary | ICD-10-CM

## 2020-07-05 NOTE — Progress Notes (Signed)
Received notification that patient's PET scan had been cancelled due to change in insurance. Able to work with [employer] and get PET approved. Rescheduled for 07/07/2020  Patient is aware of new appointment time. Reviewed PET education with her.   Oncology Nurse Navigator Documentation  Oncology Nurse Navigator Flowsheets 07/05/2020  Abnormal Finding Date -  Diagnosis Status -  Navigator Follow Up Date: 07/07/2020  Navigator Follow Up Reason: Scan Review  Navigator Location [facility]  Referral Date to RadOnc/MedOnc -  Navigator Encounter Type Appt/Treatment Plan Review;Telephone  Telephone Incoming Call;Appt Confirmation/Clarification  Patient Visit Type MedOnc  Treatment Phase Abnormal Scans  Barriers/Navigation Needs Anxiety;Coordination of Care;Education;Personal Conflicts  Education Other  Interventions Coordination of Care;Education;Psycho-Social Support  Acuity Level 4-High Needs (Greater Than 4 Barriers Identified)  Coordination of Care Appts;Radiology  Education Method Verbal  Support Groups/Services Friends and Family  Time Spent with Patient 53

## 2020-07-06 ENCOUNTER — Encounter (HOSPITAL_COMMUNITY): Payer: Medicaid Other

## 2020-07-06 ENCOUNTER — Ambulatory Visit (HOSPITAL_COMMUNITY): Admission: RE | Admit: 2020-07-06 | Payer: Medicaid Other | Source: Ambulatory Visit

## 2020-07-07 ENCOUNTER — Encounter: Payer: Self-pay | Admitting: *Deleted

## 2020-07-07 ENCOUNTER — Ambulatory Visit (HOSPITAL_COMMUNITY)
Admission: RE | Admit: 2020-07-07 | Discharge: 2020-07-07 | Disposition: A | Discharge status: Home / Self Care | Payer: Medicaid Other | Source: Ambulatory Visit | Attending: Family | Admitting: Family

## 2020-07-07 ENCOUNTER — Other Ambulatory Visit: Payer: Self-pay

## 2020-07-07 DIAGNOSIS — C7951 Secondary malignant neoplasm of bone: Secondary | ICD-10-CM | POA: Diagnosis not present

## 2020-07-07 LAB — GLUCOSE, CAPILLARY: Glucose-Capillary: 97 mg/dL (ref 70–99)

## 2020-07-07 IMAGING — PT NM PET TUM IMG INITIAL (PI) WHOLE BODY
1 of 9 series · 3 of 25 positions shown · non-contrast
Comparison: Chest CT [DATE]

CLINICAL DATA: Initial treatment strategy for osseous metastatic
disease. No known primary.

EXAM:
NUCLEAR MEDICINE PET WHOLE BODY
TECHNIQUE: 10.1 mCi F-18 FDG was injected intravenously. Full-ring PET imaging
was performed from the head to foot after the radiotracer. CT data
was obtained and used for attenuation correction and anatomic
localization.
Fasting blood glucose: 97 mg/dl

[Series 4: ct wb 5.0 hd_fov · axial · 5.0mm · 1.27mm/px · z∈[+30,+966]mm · 3 of 470 slices shown]
[im 118/470  soft-tissue]
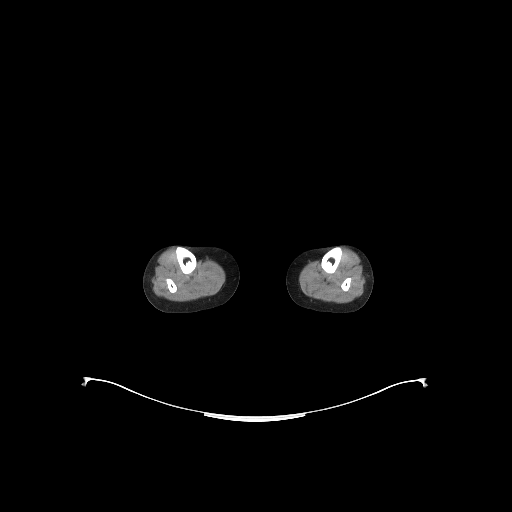
[im 235/470  soft-tissue]
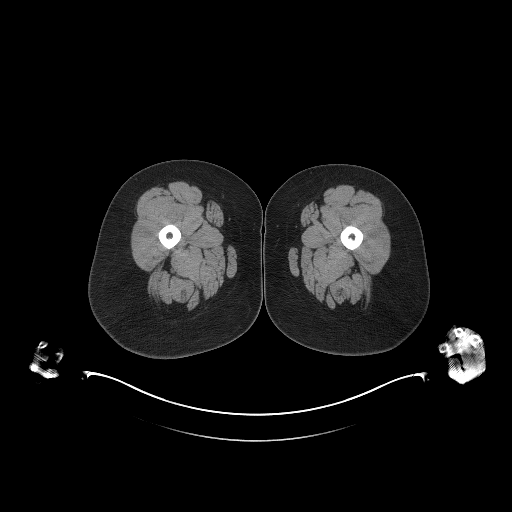
[im 352/470  soft-tissue]
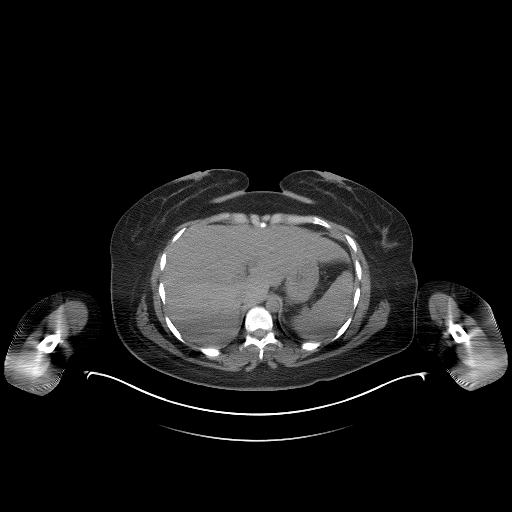

[3 of 25 positions shown; findings below may reference images not displayed]

FINDINGS: Mediastinal blood pool activity: SUV max

HEAD/NECK: Diffuse symmetric and fairly marked hypermetabolism noted
in the posterior nasopharynx, tonsillar regions and in the
submandibular glands. There also numerous borderline neck nodes
which are moderately hypermetabolic. SUV max is 6.69. This has the
appearance of an acute inflammatory process most likely pharyngitis.
COVID Omicron infection can give this appearance on PET-CT. I think
it is unlikely this is lymphoma.

Incidental CT findings: none

CHEST: No breast masses, supraclavicular or axillary adenopathy. No
mediastinal or hilar mass or adenopathy. No worrisome pulmonary
lesions.

Incidental CT findings: none

ABDOMEN/PELVIS: No abnormal hypermetabolic activity within the
liver, pancreas, adrenal glands, or spleen. No hypermetabolic lymph
nodes in the abdomen or pelvis.

There is a focus of hypermetabolism along the medial aspect of the
left ovary. I do not see any definite abnormality on the CT scan.
SUV max is 6.38. Pelvic ultrasound may be helpful for further
evaluation.

Incidental CT findings: none

SKELETON: Diffuse sclerotic metastatic bone disease as demonstrated
on the prior CT scan. Significant disease involving the pelvis with
large areas of sclerosis. SUV max in the left hemipelvis is 4.06.

There are also several scattered small subcutaneous nodules which
are weakly hypermetabolic. There is a small lesion involving the
anterior abdominal wall at the level of the ASIS which has an SUV
max of 9.59.

Incidental CT findings: none

EXTREMITIES: Moderate uptake is noted in the right teres minor
muscle which could be related to an injection. Scattered small
subcutaneous lesions are noted involving the abdominal wall area. I
do not see any other definite extremity lesions.

Incidental CT findings: none
IMPRESSION: 1. Diffuse sclerotic osseous metastatic disease of unknown primary.
No pathologic fracture or spinal canal compromise.
2. No obvious primary neoplastic process is identified.
3. Diffuse hypermetabolism involving the pharynx and tonsils along
with hypermetabolic lymph nodes. I think this is most likely an
acute inflammatory process possibly COVID.
4. Scattered mildly hypermetabolic subcutaneous nodules, possibly
metastatic disease.
5. There is a focus of mild hypermetabolism involving the left
ovary. This is much more likely functional than neoplastic. No
obvious CT abnormality. Recommend pelvic ultrasound for further
evaluation.

## 2020-07-07 MED ORDER — FLUDEOXYGLUCOSE F - 18 (FDG) INJECTION
10.5000 | Freq: Once | INTRAVENOUS | Status: AC | PRN
  Administered 2020-07-07: 10.1 via INTRAVENOUS

## 2020-07-07 NOTE — Progress Notes (Signed)
PET results reviewed with Dr Marin Olp. He will referral patient, likely to an orthopedic surgeon, for open biopsy.   Patient called and results reviewed with them. They understand the next steps.   Oncology Nurse Navigator Documentation  Oncology Nurse Navigator Flowsheets 07/07/2020  Abnormal Finding Date -  Diagnosis Status -  Navigator Follow Up Date: 07/11/2020  Navigator Follow Up Reason: Appointment Review  Navigator Location [facility]  Referral Date to RadOnc/MedOnc -  Navigator Encounter Type Scan Review;Telephone  Telephone Outgoing Call  Patient Visit Type MedOnc  Treatment Phase Abnormal Scans  Barriers/Navigation Needs Anxiety;Coordination of Care;Education;Personal Conflicts  Education Other  Interventions Coordination of Care;Education;Psycho-Social Support  Acuity Level 4-High Needs (Greater Than 4 Barriers Identified)  Coordination of Care -  Education Method Verbal  Support Groups/Services Friends and Family  Time Spent with Patient 80

## 2020-07-08 ENCOUNTER — Other Ambulatory Visit: Payer: Self-pay

## 2020-07-08 ENCOUNTER — Other Ambulatory Visit: Payer: Self-pay | Admitting: Hematology & Oncology

## 2020-07-08 ENCOUNTER — Ambulatory Visit (INDEPENDENT_AMBULATORY_CARE_PROVIDER_SITE_OTHER): Payer: Medicaid Other | Admitting: Family Medicine

## 2020-07-08 ENCOUNTER — Encounter: Payer: Self-pay | Admitting: Family Medicine

## 2020-07-08 ENCOUNTER — Telehealth: Payer: Self-pay | Admitting: Hematology & Oncology

## 2020-07-08 VITALS — BP 108/70 | HR 108 | Temp 98.4°F | Resp 18 | Ht 67.0 in | Wt 212.6 lb

## 2020-07-08 DIAGNOSIS — G43019 Migraine without aura, intractable, without status migrainosus: Secondary | ICD-10-CM | POA: Diagnosis not present

## 2020-07-08 DIAGNOSIS — F411 Generalized anxiety disorder: Secondary | ICD-10-CM

## 2020-07-08 DIAGNOSIS — G4700 Insomnia, unspecified: Secondary | ICD-10-CM

## 2020-07-08 DIAGNOSIS — F419 Anxiety disorder, unspecified: Secondary | ICD-10-CM | POA: Diagnosis not present

## 2020-07-08 DIAGNOSIS — M899 Disorder of bone, unspecified: Secondary | ICD-10-CM

## 2020-07-08 DIAGNOSIS — R079 Chest pain, unspecified: Secondary | ICD-10-CM

## 2020-07-08 MED ORDER — AMITRIPTYLINE HCL 10 MG PO TABS: 10.0000 mg | ORAL_TABLET | Freq: Every evening | ORAL | 1 refills | Status: DC | PRN

## 2020-07-08 MED ORDER — LORAZEPAM 0.5 MG PO TABS: 0.5000 mg | ORAL_TABLET | Freq: Two times a day (BID) | ORAL | 1 refills | Status: DC | PRN

## 2020-07-08 NOTE — Assessment & Plan Note (Signed)
At the end of the ov pt mentioned chest pain as we were leaving the room She has been to er and ekg done and was normal  May be due to severe anxiety but pt is requesting a cardiology referral

## 2020-07-08 NOTE — Assessment & Plan Note (Signed)
Pt reluctant to take meds Mri reviewed  Referral to neuro  Amitriptyline ordered

## 2020-07-08 NOTE — Assessment & Plan Note (Signed)
Probably due to anxiety and cancer dx Pt given names / numbers psych  Pt very reluctant to take med

## 2020-07-08 NOTE — Progress Notes (Addendum)
Patient ID: Jodi Norman, female    DOB: [birth year]  Age: 25 y.o. MRN: [id number]    Subjective:  Subjective  HPI Jodi Norman presents to establish   She was dx with bone cancer and is waiting for open biopsy.  She is a single mother from [place] -- she just moved here in April and has no family here and has a 65 yo baby .  She is having a lot of anxiety and daily headaches.   An MRI was done and showed migranous changes vs demylenating process  Review of Systems  Constitutional: Negative for appetite change, diaphoresis, fatigue and unexpected weight change.  Eyes: Negative for pain, redness and visual disturbance.  Respiratory: Negative for cough, chest tightness, shortness of breath and wheezing.   Cardiovascular: Negative for chest pain, palpitations and leg swelling.  Endocrine: Negative for cold intolerance, heat intolerance, polydipsia, polyphagia and polyuria.  Genitourinary: Negative for difficulty urinating, dysuria and frequency.  Neurological: Positive for headaches. Negative for dizziness, light-headedness and numbness.  Psychiatric/Behavioral: Negative for agitation, behavioral problems, confusion, decreased concentration, dysphoric mood, self-injury, sleep disturbance and suicidal ideas. The patient is nervous/anxious.     History Past Medical History:  Diagnosis Date  . Generalized anxiety disorder 06/24/2020    She has no past surgical history on file.   Her family history includes Diabetes in her father; Hyperlipidemia in her father and mother; Hypertension in her father and mother.She reports that she has never smoked. She has never used smokeless tobacco. She reports that she does not drink alcohol and does not use drugs.  Current Outpatient Medications on File Prior to Visit  Medication Sig Dispense Refill  . hydrOXYzine (ATARAX/VISTARIL) 25 MG tablet Take 1 tablet (25 mg total) by mouth every 6 (six) hours as needed. 20 tablet 0  . ibuprofen (ADVIL) 800 MG tablet Take 1  tablet (800 mg total) by mouth 3 (three) times daily as needed. 21 tablet 0   No current facility-administered medications on file prior to visit.     Objective:  Objective  Physical Exam Vitals and nursing note reviewed.  Constitutional:      Appearance: She is well-developed and well-nourished.  HENT:     Head: Normocephalic and atraumatic.  Eyes:     Extraocular Movements: EOM normal.     Conjunctiva/sclera: Conjunctivae normal.  Neck:     Thyroid: No thyromegaly.     Vascular: No carotid bruit or JVD.  Cardiovascular:     Rate and Rhythm: Normal rate and regular rhythm.     Heart sounds: Normal heart sounds. No murmur heard.   Pulmonary:     Effort: Pulmonary effort is normal. No respiratory distress.     Breath sounds: Normal breath sounds. No wheezing or rales.  Chest:     Chest wall: No tenderness.  Musculoskeletal:        General: No edema.     Cervical back: Normal range of motion and neck supple.  Neurological:     Mental Status: She is alert and oriented to person, place, and time.  Psychiatric:        Attention and Perception: Attention and perception normal.        Mood and Affect: Mood is anxious. Mood is not depressed or elated. Affect is not labile, blunt, flat, angry, tearful or inappropriate.        Speech: Speech normal.        Behavior: Behavior normal.        Thought Content:  Thought content normal.        Cognition and Memory: Cognition normal.        Judgment: Judgment normal.    BP 108/70 (BP Location: Right Arm, Patient Position: Sitting, Cuff Size: Large)   Pulse (!) 108   Temp 98.4 F (36.9 C) (Oral)   Resp 18   Ht 5\' 7"  (1.702 m)   Wt 212 lb 9.6 oz (96.4 kg)   LMP 06/13/2020   SpO2 97%   BMI 33.30 kg/m  Wt Readings from Last 3 Encounters:  07/08/20 212 lb 9.6 oz (96.4 kg)  06/22/20 203 lb (92.1 kg)  01/13/20 209 lb 7 oz (95 kg)     Lab Results  Component Value Date   WBC 6.6 06/22/2020   HGB 12.5 06/22/2020   HCT 37.2  06/22/2020   PLT 338 06/22/2020   GLUCOSE 137 (H) 06/22/2020   ALT 25 06/22/2020   AST 27 06/22/2020   NA 137 06/22/2020   K 3.2 (L) 06/22/2020   CL 102 06/22/2020   CREATININE 0.68 06/22/2020   BUN 8 06/22/2020   CO2 21 (L) 06/22/2020   TSH 1.382 06/22/2020    NM PET Image Initial (PI) Whole Body  Result Date: 07/07/2020 CLINICAL DATA:  Initial treatment strategy for osseous metastatic disease. No known primary. EXAM: NUCLEAR MEDICINE PET WHOLE BODY TECHNIQUE: 10.1 mCi F-18 FDG was injected intravenously. Full-ring PET imaging was performed from the head to foot after the radiotracer. CT data was obtained and used for attenuation correction and anatomic localization. Fasting blood glucose: 97 mg/dl COMPARISON:  Chest CT 06/22/2020 FINDINGS: Mediastinal blood pool activity: SUV max 1.83 HEAD/NECK: Diffuse symmetric and fairly marked hypermetabolism noted in the posterior nasopharynx, tonsillar regions and in the submandibular glands. There also numerous borderline neck nodes which are moderately hypermetabolic. SUV max is 6.69. This has the appearance of an acute inflammatory process most likely pharyngitis. COVID Omicron infection can give this appearance on PET-CT. I think it is unlikely this is lymphoma. Incidental CT findings: none CHEST: No breast masses, supraclavicular or axillary adenopathy. No mediastinal or hilar mass or adenopathy. No worrisome pulmonary lesions. Incidental CT findings: none ABDOMEN/PELVIS: No abnormal hypermetabolic activity within the liver, pancreas, adrenal glands, or spleen. No hypermetabolic lymph nodes in the abdomen or pelvis. There is a focus of hypermetabolism along the medial aspect of the left ovary. I do not see any definite abnormality on the CT scan. SUV max is 6.38. Pelvic ultrasound may be helpful for further evaluation. Incidental CT findings: none SKELETON: Diffuse sclerotic metastatic bone disease as demonstrated on the prior CT scan. Significant  disease involving the pelvis with large areas of sclerosis. SUV max in the left hemipelvis is 4.06. There are also several scattered small subcutaneous nodules which are weakly hypermetabolic. There is a small lesion involving the anterior abdominal wall at the level of the ASIS which has an SUV max of 9.59. Incidental CT findings: none EXTREMITIES: Moderate uptake is noted in the right teres minor muscle which could be related to an injection. Scattered small subcutaneous lesions are noted involving the abdominal wall area. I do not see any other definite extremity lesions. Incidental CT findings: none IMPRESSION: 1. 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 No pathologic fracture or spinal canal compromise. 2. No obvious primary neoplastic process is identified. 3. Diffuse hypermetabolism involving the pharynx and tonsils along with hypermetabolic lymph nodes. I think this is most likely an acute inflammatory process possibly COVID. 4. Scattered  mildly hypermetabolic subcutaneous nodules, possibly metastatic disease. 5. There is a focus of mild hypermetabolism involving the left ovary. This is much more likely functional than neoplastic. No obvious CT abnormality. Recommend pelvic ultrasound for further evaluation. Electronically Signed   By: Marijo Sanes M.D.   On: 07/07/2020 10:51     Assessment & Plan:  Plan  I have discontinued Khrista Moxley's lidocaine, benzonatate, and clonazePAM. I am also having her start on amitriptyline and LORazepam. Additionally, I am having her maintain her ibuprofen and hydrOXYzine.  Meds ordered this encounter  Medications  . amitriptyline (ELAVIL) 10 MG tablet    Sig: Take 1 tablet (10 mg total) by mouth at bedtime as needed for sleep.    Dispense:  30 tablet    Refill:  1  . LORazepam (ATIVAN) 0.5 MG tablet    Sig: Take 1 tablet (0.5 mg total) by mouth 2 (two) times daily as needed for anxiety.    Dispense:  30 tablet    Refill:  1     Problem List Items Addressed This Visit      Unprioritized   Chest pain    At the end of the ov pt mentioned chest pain as we were leaving the room She has been to er and ekg done and was normal  May be due to severe anxiety but pt is requesting a cardiology referral       Relevant Orders   Ambulatory referral to Cardiology   ECHOCARDIOGRAM COMPLETE   Generalized anxiety disorder    Amitriptyline at bed time  lorazapam prn klonpin d/c  Pt given names / numbers psych       Relevant Medications   amitriptyline (ELAVIL) 10 MG tablet   LORazepam (ATIVAN) 0.5 MG tablet   Insomnia    Probably due to anxiety and cancer dx Pt given names / numbers psych  Pt very reluctant to take med      Relevant Medications   amitriptyline (ELAVIL) 10 MG tablet   Intractable migraine without aura and without status migrainosus    Pt reluctant to take meds Mri reviewed  Referral to neuro  Amitriptyline ordered       Relevant Medications   amitriptyline (ELAVIL) 10 MG tablet   Other Relevant Orders   Ambulatory referral to Neurology    Other Visit Diagnoses    Anxiety    -  Primary   Relevant Medications   amitriptyline (ELAVIL) 10 MG tablet   LORazepam (ATIVAN) 0.5 MG tablet      Follow-up: Return in about 3 months (around 10/05/2020), or if symptoms worsen or fail to improve, for annual exam, fasting----- rto 1 month f/u anxiety.  Ann Held, DO

## 2020-07-08 NOTE — Assessment & Plan Note (Signed)
Amitriptyline at bed time  lorazapam prn klonpin d/c  Pt given names / numbers psych

## 2020-07-08 NOTE — Patient Instructions (Signed)
[email or web address]">  Generalized Anxiety Disorder, Adult Generalized anxiety disorder (GAD) is a mental health condition. Unlike normal worries, anxiety related to GAD is not triggered by a specific event. These worries do not fade or get better with time. GAD interferes with relationships, work, and school. GAD symptoms can vary from mild to severe. People with severe GAD can have intense waves of anxiety with physical symptoms that are similar to panic attacks. What are the causes? The exact cause of GAD is not known, but the following are believed to have an impact:  Differences in natural brain chemicals.  Genes passed down from parents to children.  Differences in the way threats are perceived.  Development during childhood.  Personality. What increases the risk? The following factors may make you more likely to develop this condition:  Being female.  Having a family history of anxiety disorders.  Being very shy.  Experiencing very stressful life events, such as the death of a loved one.  Having a very stressful family environment. What are the signs or symptoms? People with GAD often worry excessively about many things in their lives, such as their health and family. Symptoms may also include:  Mental and emotional symptoms: ? Worrying excessively about natural disasters. ? Fear of being late. ? Difficulty concentrating. ? Fears that others are judging your performance.  Physical symptoms: ? Fatigue. ? Headaches, muscle tension, muscle twitches, trembling, or feeling shaky. ? Feeling like your heart is pounding or beating very fast. ? Feeling out of breath or like you cannot take a deep breath. ? Having trouble falling asleep or staying asleep, or experiencing restlessness. ? Sweating. ? Nausea, diarrhea, or irritable bowel syndrome (IBS).  Behavioral symptoms: ? Experiencing erratic moods or irritability. ? Avoidance of new situations. ? Avoidance of  people. ? Extreme difficulty making decisions. How is this diagnosed? This condition is diagnosed based on your symptoms and medical history. You will also have a physical exam. Your health care provider may perform tests to rule out other possible causes of your symptoms. To be diagnosed with GAD, a person must have anxiety that:  Is out of his or her control.  Affects several different aspects of his or her life, such as work and relationships.  Causes distress that makes him or her unable to take part in normal activities.  Includes at least three symptoms of GAD, such as restlessness, fatigue, trouble concentrating, irritability, muscle tension, or sleep problems. Before your health care provider can confirm a diagnosis of GAD, these symptoms must be present more days than they are not, and they must last for 6 months or longer. How is this treated? This condition may be treated with:  Medicine. Antidepressant medicine is usually prescribed for long-term daily control. Anti-anxiety medicines may be added in severe cases, especially when panic attacks occur.  Talk therapy (psychotherapy). Certain types of talk therapy can be helpful in treating GAD by providing support, education, and guidance. Options include: ? Cognitive behavioral therapy (CBT). People learn coping skills and self-calming techniques to ease their physical symptoms. They learn to identify unrealistic thoughts and behaviors and to replace them with more appropriate thoughts and behaviors. ? Acceptance and commitment therapy (ACT). This treatment teaches people how to be mindful as a way to cope with unwanted thoughts and feelings. ? Biofeedback. This process trains you to manage your body's response (physiological response) through breathing techniques and relaxation methods. You will work with a therapist while machines are used to monitor your physical   symptoms.  Stress management techniques. These include yoga,  meditation, and exercise. A mental health specialist can help determine which treatment is best for you. Some people see improvement with one type of therapy. However, other people require a combination of therapies.   Follow these instructions at home: Lifestyle  Maintain a consistent routine and schedule.  Anticipate stressful situations. Create a plan, and allow extra time to work with your plan.  Practice stress management or self-calming techniques that you have learned from your therapist or your health care provider. General instructions  Take over-the-counter and prescription medicines only as told by your health care provider.  Understand that you are likely to have setbacks. Accept this and be kind to yourself as you persist to take better care of yourself.  Recognize and accept your accomplishments, even if you judge them as small.  Keep all follow-up visits as told by your health care provider. This is important. Contact a health care provider if:  Your symptoms do not get better.  Your symptoms get worse.  You have signs of depression, such as: ? A persistently sad or irritable mood. ? Loss of enjoyment in activities that used to bring you joy. ? Change in weight or eating. ? Changes in sleeping habits. ? Avoiding friends or family members. ? Loss of energy for normal tasks. ? Feelings of guilt or worthlessness. Get help right away if:  You have serious thoughts about hurting yourself or others. If you ever feel like you may hurt yourself or others, or have thoughts about taking your own life, get help right away. Go to your nearest emergency department or:  Call your local emergency services (911 in the [place]  Call a suicide crisis helpline, such as the National Suicide Prevention Lifeline at 1-800-273-8255. This is open 24 hours a day in the [place]  Text the Crisis Text Line at 741741 (in the [place] Summary  Generalized anxiety disorder (GAD) is a mental  health condition that involves worry that is not triggered by a specific event.  People with GAD often worry excessively about many things in their lives, such as their health and family.  GAD may cause symptoms such as restlessness, trouble concentrating, sleep problems, frequent sweating, nausea, diarrhea, headaches, and trembling or muscle twitching.  A mental health specialist can help determine which treatment is best for you. Some people see improvement with one type of therapy. However, other people require a combination of therapies. This information is not intended to replace advice given to you by your health care provider. Make sure you discuss any questions you have with your health care provider. Document Revised: 03/04/2019 Document Reviewed: 03/04/2019 Elsevier Patient Education  2021 Elsevier Inc.  

## 2020-07-08 NOTE — Telephone Encounter (Signed)
I called Jodi Norman last night about the PET scan results.  It is still not clear to me whether she has malignancy.  She does have some uptake in her bones.  There is some uptake which is minimal in some subcutaneous nodules.  She cannot feel any nodules.  Again, I just cannot imagine why a 25 year old woman would have metastatic disease of unknown primary site.  There is no obvious source for this activity.  Her organs look fine.  We are going to have to get a biopsy.  I initially thought that a biopsy of a subcutaneous nodule would be reasonable.  However, if this is negative, then we still have to go for a bone biopsy.  I realize this is a lot more trickier.  However, there does seem to be areas that could be accessed.  I just have tried to reassure Jodi Norman is much as possible that we are not sure that we are dealing with cancer yet.  If this is malignant, we might be looking at a bone lymphoma.  Again, I just cannot imagine what else we might be looking at.  I do not know if Ewing sarcoma would be a possibility.  I know this is very stressful for her.  I know she has a lot of anxiety.  We will try to get a biopsy done is quickly as possible.  She has a young daughter that she is trying to help take care of.  I told her that again I do not know this is cancer.  So far, I am not seeing anything that looks obviously like malignancy.  I know that the radiologist are very good at reading scans.  We will try to get a biopsy next week.  Again, I really believe a bone biopsy, although more challenging, will where we need to go for a biopsy that will tell us what is going on.  She is very appreciative of my phone call even though it was later in the evening.  I told her that I just want her to know that we are working hard to figure out what is going on and how we can try to help her.  Jodi Haw, MD

## 2020-07-11 ENCOUNTER — Encounter (HOSPITAL_COMMUNITY): Payer: Self-pay

## 2020-07-11 ENCOUNTER — Encounter: Payer: Self-pay | Admitting: Cardiology

## 2020-07-11 ENCOUNTER — Telehealth: Payer: Medicaid Other | Admitting: Hematology & Oncology

## 2020-07-11 ENCOUNTER — Other Ambulatory Visit: Payer: Self-pay

## 2020-07-11 ENCOUNTER — Other Ambulatory Visit: Payer: Self-pay | Admitting: Hematology & Oncology

## 2020-07-11 ENCOUNTER — Ambulatory Visit: Payer: Medicaid Other | Admitting: Cardiology

## 2020-07-11 ENCOUNTER — Ambulatory Visit: Payer: Medicaid Other

## 2020-07-11 ENCOUNTER — Other Ambulatory Visit: Payer: Medicaid Other

## 2020-07-11 VITALS — BP 104/68 | HR 77 | Ht 67.0 in | Wt 215.0 lb

## 2020-07-11 DIAGNOSIS — R400 Somnolence: Secondary | ICD-10-CM | POA: Insufficient documentation

## 2020-07-11 DIAGNOSIS — E8881 Metabolic syndrome: Secondary | ICD-10-CM | POA: Diagnosis not present

## 2020-07-11 DIAGNOSIS — R002 Palpitations: Secondary | ICD-10-CM | POA: Diagnosis not present

## 2020-07-11 DIAGNOSIS — O1493 Unspecified pre-eclampsia, third trimester: Secondary | ICD-10-CM | POA: Insufficient documentation

## 2020-07-11 DIAGNOSIS — M899 Disorder of bone, unspecified: Secondary | ICD-10-CM

## 2020-07-11 DIAGNOSIS — R5383 Other fatigue: Secondary | ICD-10-CM | POA: Diagnosis not present

## 2020-07-11 DIAGNOSIS — R0789 Other chest pain: Secondary | ICD-10-CM

## 2020-07-11 DIAGNOSIS — R0600 Dyspnea, unspecified: Secondary | ICD-10-CM

## 2020-07-11 DIAGNOSIS — R0602 Shortness of breath: Secondary | ICD-10-CM

## 2020-07-11 DIAGNOSIS — E669 Obesity, unspecified: Secondary | ICD-10-CM | POA: Diagnosis not present

## 2020-07-11 DIAGNOSIS — R0609 Other forms of dyspnea: Secondary | ICD-10-CM

## 2020-07-11 NOTE — Patient Instructions (Addendum)
Medication Instructions:  Your physician recommends that you continue on your current medications as directed. Please refer to the Current Medication list given to you today.  *If you need a refill on your cardiac medications before your next appointment, please call your pharmacy*   Lab Work: Your physician recommends that you return for lab work: TODAY Vitamin D, A1C If you have labs (blood work) drawn today and your tests are completely normal, you will receive your results only by: Marland Kitchen MyChart Message (if you have MyChart) OR . A paper copy in the mail If you have any lab test that is abnormal or we need to change your treatment, we will call you to review the results.   Testing/Procedures: Your physician has requested that you have an echocardiogram. Echocardiography is a painless test that uses sound waves to create images of your heart. It provides your doctor with information about the size and shape of your heart and how well your heart's chambers and valves are working. This procedure takes approximately one hour. There are no restrictions for this procedure. Your physician has recommended that you have a sleep study. This test records several body functions during sleep, including: brain activity, eye movement, oxygen and carbon dioxide blood levels, heart rate and rhythm, breathing rate and rhythm, the flow of air through your mouth and nose, snoring, body muscle movements, and chest and belly movement.  A zio monitor was ordered today. It will remain on for 14 days. You will then return monitor and event diary in provided box. It takes 1-2 weeks for report to be downloaded and returned to [place]. We will call you with the results. If monitor falls off or has orange flashing light, please call Zio for further instructions.     Follow-Up: At [facility], you and your health needs are our priority.  As part of our continuing mission to provide you with exceptional heart care, we have  created designated Provider Care Teams.  These Care Teams include your primary Cardiologist (physician) and Advanced Practice Providers (APPs -  Physician Assistants and Nurse Practitioners) who all work together to provide you with the care you need, when you need it.  We recommend signing up for the patient portal called "MyChart".  Sign up information is provided on this After Visit Summary.  MyChart is used to connect with patients for Virtual Visits (Telemedicine).  Patients are able to view lab/test results, encounter notes, upcoming appointments, etc.  Non-urgent messages can be sent to your provider as well.   To learn more about what you can do with MyChart, go to [email or web address].    Your next appointment:   2 month(s)  The format for your next appointment:   In Person  Provider:   Berniece Salines, DO   Other Instructions  Sleep Study, Adult A sleep study (polysomnogram) is a series of tests done while you are sleeping. A sleep study records your brain waves, heart rate, breathing rate, oxygen level, and eye and leg movements. A sleep study helps your health care provider:  See how well you sleep.  Diagnose a sleep disorder.  Determine how severe your sleep disorder is.  Create a plan to treat your sleep disorder. Your health care provider may recommend a sleep study if you:  Feel sleepy on most days.  Snore loudly while sleeping.  Have unusual behaviors while you sleep, such as walking.  Have brief periods in which you stop breathing during sleep (sleepapnea).  Fall asleep suddenly  during the day (narcolepsy).  Have trouble falling asleep or staying asleep (insomnia).  Feel like you need to move your legs when trying to fall asleep (restless legs syndrome).  Move your legs by flexing and extending them regularly while asleep (periodic limb movement disorder).  Act out your dreams while you sleep (sleep behavior disorder).  Feel like you cannot move when  you first wake up (sleep paralysis). What tests are part of a sleep study? Most sleep studies record the following during sleep:  Brain activity.  Eye movements.  Heart rate and rhythm.  Breathing rate and rhythm.  Blood-oxygen level.  Blood pressure.  Chest and belly movement as you breathe.  Arm and leg movements.  Snoring or other noises.  Body position. Where are sleep studies done? Sleep studies are done at sleep centers. A sleep center may be inside a hospital, office, or clinic. The room where you have the study may look like a hospital room or a hotel room. The health care providers doing the study may come in and out of the room during the study. Most of the time, they will be in another room monitoring your test as you sleep. How are sleep studies done? Most sleep studies are done during a normal period of time for a full night of sleep. You will arrive at the study center in the evening and go home in the morning. Before the test  Bring your pajamas and toothbrush with you to the sleep study.  Do not have caffeine on the day of your sleep study.  Do not drink alcohol on the day of your sleep study.  Your health care provider will let you know if you should stop taking any of your regular medicines before the test. During the test  Round, sticky patches with sensors attached to recording wires (electrodes) are placed on your scalp, face, chest, and limbs.  Wires from all the electrodes and sensors run from your bed to a computer. The wires can be taken off and put back on if you need to get out of bed to go to the bathroom.  A sensor is placed over your nose to measure airflow.  A finger clip is put on your finger or ear to measure your blood oxygen level (pulse oximetry).  A belt is placed around your belly and a belt is placed around your chest to measure breathing movements.  If you have signs of the sleep disorder called sleep apnea during your test, you  may get a treatment mask to wear for the second half of the night. ? The mask provides positive airway pressure (PAP) to help you breathe better during sleep. This may greatly improve your sleep apnea. ? You will then have all tests done again with the mask in place to see if your measurements and recordings change.      After the test  A medical doctor who specializes in sleep will evaluate the results of your sleep study and share them with you and your primary health care provider.  Based on your results, your medical history, and a physical exam, you may be diagnosed with a sleep disorder, such as: ? Sleep apnea. ? Restless legs syndrome. ? Sleep-related behavior disorder. ? Sleep-related movement disorders. ? Sleep-related seizure disorders.  Your health care team will help determine your treatment options based on your diagnosis. This may include: ? Improving your sleep habits (sleep hygiene). ? Wearing a continuous positive airway pressure (CPAP) or bi-level  positive airway pressure (BPAP) mask. ? Wearing an oral device at night to improve breathing and reduce snoring. ? Taking medicines. Follow these instructions at home:  Take over-the-counter and prescription medicines only as told by your health care provider.  If you are instructed to use a CPAP or BPAP mask, make sure you use it nightly as directed.  Make any lifestyle changes that your health care provider recommends.  If you were given a device to open your airway while you sleep, use it only as told by your health care provider.  Do not use any tobacco products, such as cigarettes, chewing tobacco, and e-cigarettes. If you need help quitting, ask your health care provider.  Keep all follow-up visits as told by your health care provider. This is important. Summary  A sleep study (polysomnogram) is a series of tests done while you are sleeping. It shows how well you sleep.  Most sleep studies are done over one full  night of sleep. You will arrive at the study center in the evening and go home in the morning.  If you have signs of the sleep disorder called sleep apnea during your test, you may get a treatment mask to wear for the second half of the night.  A medical doctor who specializes in sleep will evaluate the results of your sleep study and share them with your primary health care provider. This information is not intended to replace advice given to you by your health care provider. Make sure you discuss any questions you have with your health care provider. Document Revised: 06/19/2019 Document Reviewed: 06/11/2017 Elsevier Patient Education  2021 [employer].  Echocardiogram An echocardiogram is a test that uses sound waves (ultrasound) to produce images of the heart. Images from an echocardiogram can provide important information about:  Heart size and shape.  The size and thickness and movement of your heart's walls.  Heart muscle function and strength.  Heart valve function or if you have stenosis. Stenosis is when the heart valves are too narrow.  If blood is flowing backward through the heart valves (regurgitation).  A tumor or infectious growth around the heart valves.  Areas of heart muscle that are not working well because of poor blood flow or injury from a heart attack.  Aneurysm detection. An aneurysm is a weak or damaged part of an artery wall. The wall bulges out from the normal force of blood pumping through the body. Tell a health care provider about:  Any allergies you have.  All medicines you are taking, including vitamins, herbs, eye drops, creams, and over-the-counter medicines.  Any blood disorders you have.  Any surgeries you have had.  Any medical conditions you have.  Whether you are pregnant or may be pregnant. What are the risks? Generally, this is a safe test. However, problems may occur, including an allergic reaction to dye (contrast) that may be  used during the test. What happens before the test? No specific preparation is needed. You may eat and drink normally. What happens during the test?  You will take off your clothes from the waist up and put on a hospital gown.  Electrodes or electrocardiogram (ECG)patches may be placed on your chest. The electrodes or patches are then connected to a device that monitors your heart rate and rhythm.  You will lie down on a table for an ultrasound exam. A gel will be applied to your chest to help sound waves pass through your skin.  A handheld device,  called a transducer, will be pressed against your chest and moved over your heart. The transducer produces sound waves that travel to your heart and bounce back (or "echo" back) to the transducer. These sound waves will be captured in real-time and changed into images of your heart that can be viewed on a video monitor. The images will be recorded on a computer and reviewed by your health care provider.  You may be asked to change positions or hold your breath for a short time. This makes it easier to get different views or better views of your heart.  In some cases, you may receive contrast through an IV in one of your veins. This can improve the quality of the pictures from your heart. The procedure may vary among health care providers and hospitals.   What can I expect after the test? You may return to your normal, everyday life, including diet, activities, and medicines, unless your health care provider tells you not to do that. Follow these instructions at home:  It is up to you to get the results of your test. Ask your health care provider, or the department that is doing the test, when your results will be ready.  Keep all follow-up visits. This is important. Summary  An echocardiogram is a test that uses sound waves (ultrasound) to produce images of the heart.  Images from an echocardiogram can provide important information about the  size and shape of your heart, heart muscle function, heart valve function, and other possible heart problems.  You do not need to do anything to prepare before this test. You may eat and drink normally.  After the echocardiogram is completed, you may return to your normal, everyday life, unless your health care provider tells you not to do that. This information is not intended to replace advice given to you by your health care provider. Make sure you discuss any questions you have with your health care provider. Document Revised: 01/05/2020 Document Reviewed: 01/05/2020 Elsevier Patient Education  2021 [employer].

## 2020-07-11 NOTE — Progress Notes (Addendum)
Cardiology Office Note:    Date:  07/11/2020   ID:  Jodi Norman, DOB 1995/10/08, MRN [id number]  PCP:  Ann Held, DO  Cardiologist:  Berniece Salines, DO  Electrophysiologist:  None   Referring MD: Carollee Herter, Alferd Apa, *   I Have been having some shortness of breath and palpitations  History of Present Illness:    Jodi Norman is a 25 y.o. female with a hx of recently diagnosed bone cancer and is pending biopsy, anxiety disorder, history of preeclampsia, obesity is here today to be evaluated for shortness of breath and sharp chest pain as well as palpitations.  Patient tells me for the last couple months she has been experiencing some shortness of breath on exertion.  She notes that she was able to climb a flight of stairs recently she is really not able to do that she get 1 now if she goes up the stairs.  She also does have some fatigue and time somnolence.  The patient although admits to the fact that his anxiety but her symptoms are out of proportion to her.  The shortness of breath is on exertion.  The chest pain she described as a sharp pain which lasts for some time and resolves without any intervention.  She states that she does have palpitations almost every day abrupt onset of fast heartbeat and then resolved.  She is concerned and also is worried given her new diagnosis of potential bone cancer.  No other complaints at this time.  She was seen at the emergency department medicine [place] on June 22, 2020 at that time she had a CT scan which ruled out pulmonary embolism.    Past Medical History:  Diagnosis Date   Generalized anxiety disorder 06/24/2020    History reviewed. No pertinent surgical history.  Current Medications: Current Meds  Medication Sig   amitriptyline (ELAVIL) 10 MG tablet Take 1 tablet (10 mg total) by mouth at bedtime as needed for sleep.   escitalopram (LEXAPRO) 10 MG tablet Take 10 mg by mouth daily.   famotidine (PEPCID) 20 MG tablet  Take 20 mg by mouth in the morning and at bedtime.   hydrOXYzine (ATARAX/VISTARIL) 25 MG tablet Take 1 tablet (25 mg total) by mouth every 6 (six) hours as needed.   ibuprofen (ADVIL) 800 MG tablet Take 1 tablet (800 mg total) by mouth 3 (three) times daily as needed.   LORazepam (ATIVAN) 0.5 MG tablet Take 1 tablet (0.5 mg total) by mouth 2 (two) times daily as needed for anxiety.   Prenatal MV-Min-Fe Fum-FA-DHA (PRENATAL 1 PO) Take 1 tablet by mouth daily.     Allergies:   Patient has no known allergies.   Social History   Socioeconomic History   Marital status: Single    Spouse name: Not on file   Number of children: Not on file   Years of education: Not on file   Highest education level: Not on file  Occupational History   Not on file  Tobacco Use   Smoking status: Never Smoker   Smokeless tobacco: Never Used  Substance and Sexual Activity   Alcohol use: Never   Drug use: Never   Sexual activity: Not Currently    Birth control/protection: None  Other Topics Concern   Not on file  Social History Narrative   Not on file   Social Determinants of Health   Financial Resource Strain: Not on file  Food Insecurity: Not on file  Transportation Needs:  Not on file  Physical Activity: Not on file  Stress: Not on file  Social Connections: Not on file     Family History: The patient's family history includes Diabetes in her father; Hyperlipidemia in her father and mother; Hypertension in her father and mother.  ROS:   Review of Systems  Constitution: Negative for decreased appetite, fever and weight gain.  HENT: Negative for congestion, ear discharge, hoarse voice and sore throat.   Eyes: Negative for discharge, redness, vision loss in right eye and visual halos.  Cardiovascular: Negative for chest pain, dyspnea on exertion, leg swelling, orthopnea and palpitations.  Respiratory: Negative for cough, hemoptysis, shortness of breath and snoring.   Endocrine: Negative for heat  intolerance and polyphagia.  Hematologic/Lymphatic: Negative for bleeding problem. Does not bruise/bleed easily.  Skin: Negative for flushing, nail changes, rash and suspicious lesions.  Musculoskeletal: Negative for arthritis, joint pain, muscle cramps, myalgias, neck pain and stiffness.  Gastrointestinal: Negative for abdominal pain, bowel incontinence, diarrhea and excessive appetite.  Genitourinary: Negative for decreased libido, genital sores and incomplete emptying.  Neurological: Negative for brief paralysis, focal weakness, headaches and loss of balance.  Psychiatric/Behavioral: Negative for altered mental status, depression and suicidal ideas.  Allergic/Immunologic: Negative for HIV exposure and persistent infections.    EKGs/Labs/Other Studies Reviewed:    The following studies were reviewed today:   EKG:  The ekg ordered today demonstrates sinus rhythm, heart rate 77 bpm.  CTA EXAM: CT ANGIOGRAPHY CHEST WITH CONTRAST   TECHNIQUE: Multidetector CT imaging of the chest was performed using the standard protocol during bolus administration of intravenous contrast. Multiplanar CT image reconstructions and MIPs were obtained to evaluate the vascular anatomy.   CONTRAST:  1102mL OMNIPAQUE IOHEXOL 350 MG/ML SOLN   COMPARISON:  Chest radiograph dated 06/22/2020.   FINDINGS: Cardiovascular: There is no cardiomegaly or pericardial effusion. The thoracic aorta is unremarkable. The origins of the great vessels of the aortic arch appear patent. Evaluation of the pulmonary arteries is limited due to respiratory motion artifact. No pulmonary artery embolus identified.   Mediastinum/Nodes: There is no hilar adenopathy. Mildly enlarged lymph node in the lower neck to the right of the trachea (17/7) measuring 12 mm in short axis. Clinical correlation and further evaluation with ultrasound on a nonemergent/outpatient basis recommended. The esophagus is grossly unremarkable. No  mediastinal fluid collection.   Lungs/Pleura: The lungs are clear. There is no pleural effusion pneumothorax. Central airways are patent.   Upper Abdomen: No acute abnormality.   Musculoskeletal: There are sclerotic changes involving the body of the sternum as well as sclerotic changes of T4 and T9 vertebra. Additional smaller sclerotic changes involving T7 and L1 most consistent with metastatic disease. Additional osseous metastasis involving the clavicular heads and ribs. No acute osseous pathology.   Review of the MIP images confirms the above findings.   IMPRESSION: 1. No CT evidence of pulmonary embolism. 2. Osseous sclerotic metastatic disease. Correlation with history of primary malignancy recommended. 3. Mildly enlarged lymph node in the lower neck to the right of the trachea concerning for metastatic disease.  Recent Labs: 06/22/2020: ALT 25; BUN 8; Creatinine, Ser 0.68; Hemoglobin 12.5; Platelets 338; Potassium 3.2; Sodium 137; TSH 1.382  Recent Lipid Panel No results found for: CHOL, TRIG, HDL, CHOLHDL, VLDL, LDLCALC, LDLDIRECT  Physical Exam:    VS:  BP 104/68 (BP Location: Right Arm)   Pulse 77   Ht 5\' 7"  (1.702 m)   Wt 215 lb (97.5 kg)   LMP 06/13/2020  SpO2 98%   BMI 33.67 kg/m     Wt Readings from Last 3 Encounters:  07/11/20 215 lb (97.5 kg)  07/08/20 212 lb 9.6 oz (96.4 kg)  06/22/20 203 lb (92.1 kg)     GEN: Well nourished, well developed in no acute distress HEENT: Normal NECK: No JVD; No carotid bruits LYMPHATICS: No lymphadenopathy CARDIAC: S1S2 noted,RRR, no murmurs, rubs, gallops RESPIRATORY:  Clear to auscultation without rales, wheezing or rhonchi  ABDOMEN: Soft, non-tender, non-distended, +bowel sounds, no guarding. EXTREMITIES: No edema, No cyanosis, no clubbing MUSCULOSKELETAL:  No deformity  SKIN: Warm and dry NEUROLOGIC:  Alert and oriented x 3, non-focal PSYCHIATRIC:  Normal affect, good insight  ASSESSMENT:    1. Daytime  somnolence   2. Other fatigue   3. Metabolic syndrome   4. History of pre-eclampsia    5. Obesity (BMI 30-39.9)   6. Dyspnea on exertion   7. Palpitations   8. Atypical chest pain    PLAN:     Her shortness of breath is concerning out of proportion for this young female we will like to do is get an echocardiogram to assess LV and RV function and for any other structural abnormalities.  In addition I would like to rule out a cardiovascular etiology of this palpitation, therefore at this time I would like to placed a zio patch for 14 days.   Her chest pain does sound atypical which I am suspecting her anxiety is playing a role with this.  However we will continue to monitor and reassess need for any further ischemic evaluation.  For her fatigue and metabolic syndrome I like to get a vitamin D level as well as hemoglobin A1c.  History of preeclampsia her blood pressure is acceptable in the office today.  Obesity-we will need to increase activity however is better by shortness of breath on exertion.  Daytime somnolence/fatigue will order sleep study as sleep apnea could be playing a role.  She tells me her sister does have sleep apnea as well.   The patient is in agreement with the above plan. The patient left the office in stable condition.  The patient will follow up in 2 months or sooner if needed.   Medication Adjustments/Labs and Tests Ordered: Current medicines are reviewed at length with the patient today.  Concerns regarding medicines are outlined above.  No orders of the defined types were placed in this encounter.  No orders of the defined types were placed in this encounter.   Patient Instructions   Medication Instructions:  Your physician recommends that you continue on your current medications as directed. Please refer to the Current Medication list given to you today.  *If you need a refill on your cardiac medications before your next appointment, please call your  pharmacy*   Lab Work: Your physician recommends that you return for lab work: TODAY Vitamin D If you have labs (blood work) drawn today and your tests are completely normal, you will receive your results only by: MyChart Message (if you have MyChart) OR A paper copy in the mail If you have any lab test that is abnormal or we need to change your treatment, we will call you to review the results.   Testing/Procedures: Your physician has requested that you have an echocardiogram. Echocardiography is a painless test that uses sound waves to create images of your heart. It provides your doctor with information about the size and shape of your heart and how well your heart's chambers  and valves are working. This procedure takes approximately one hour. There are no restrictions for this procedure. Your physician has recommended that you have a sleep study. This test records several body functions during sleep, including: brain activity, eye movement, oxygen and carbon dioxide blood levels, heart rate and rhythm, breathing rate and rhythm, the flow of air through your mouth and nose, snoring, body muscle movements, and chest and belly movement.  A zio monitor was ordered today. It will remain on for 14 days. You will then return monitor and event diary in provided box. It takes 1-2 weeks for report to be downloaded and returned to [place]. We will call you with the results. If monitor falls off or has orange flashing light, please call Zio for further instructions.     Follow-Up: At [facility], you and your health needs are our priority.  As part of our continuing mission to provide you with exceptional heart care, we have created designated Provider Care Teams.  These Care Teams include your primary Cardiologist (physician) and Advanced Practice Providers (APPs -  Physician Assistants and Nurse Practitioners) who all work together to provide you with the care you need, when you need it.  We recommend  signing up for the patient portal called "MyChart".  Sign up information is provided on this After Visit Summary.  MyChart is used to connect with patients for Virtual Visits (Telemedicine).  Patients are able to view lab/test results, encounter notes, upcoming appointments, etc.  Non-urgent messages can be sent to your provider as well.   To learn more about what you can do with MyChart, go to [email or web address].    Your next appointment:   2 month(s)  The format for your next appointment:   In Person  Provider:   Berniece Salines, DO   Other Instructions  Sleep Study, Adult A sleep study (polysomnogram) is a series of tests done while you are sleeping. A sleep study records your brain waves, heart rate, breathing rate, oxygen level, and eye and leg movements. A sleep study helps your health care provider: See how well you sleep. Diagnose a sleep disorder. Determine how severe your sleep disorder is. Create a plan to treat your sleep disorder. Your health care provider may recommend a sleep study if you: Feel sleepy on most days. Snore loudly while sleeping. Have unusual behaviors while you sleep, such as walking. Have brief periods in which you stop breathing during sleep (sleepapnea). Fall asleep suddenly during the day (narcolepsy). Have trouble falling asleep or staying asleep (insomnia). Feel like you need to move your legs when trying to fall asleep (restless legs syndrome). Move your legs by flexing and extending them regularly while asleep (periodic limb movement disorder). Act out your dreams while you sleep (sleep behavior disorder). Feel like you cannot move when you first wake up (sleep paralysis). What tests are part of a sleep study? Most sleep studies record the following during sleep: Brain activity. Eye movements. Heart rate and rhythm. Breathing rate and rhythm. Blood-oxygen level. Blood pressure. Chest and belly movement as you breathe. Arm and leg  movements. Snoring or other noises. Body position. Where are sleep studies done? Sleep studies are done at sleep centers. A sleep center may be inside a hospital, office, or clinic. The room where you have the study may look like a hospital room or a hotel room. The health care providers doing the study may come in and out of the room during the study. Most of the time,  they will be in another room monitoring your test as you sleep. How are sleep studies done? Most sleep studies are done during a normal period of time for a full night of sleep. You will arrive at the study center in the evening and go home in the morning. Before the test Bring your pajamas and toothbrush with you to the sleep study. Do not have caffeine on the day of your sleep study. Do not drink alcohol on the day of your sleep study. Your health care provider will let you know if you should stop taking any of your regular medicines before the test. During the test Round, sticky patches with sensors attached to recording wires (electrodes) are placed on your scalp, face, chest, and limbs. Wires from all the electrodes and sensors run from your bed to a computer. The wires can be taken off and put back on if you need to get out of bed to go to the bathroom. A sensor is placed over your nose to measure airflow. A finger clip is put on your finger or ear to measure your blood oxygen level (pulse oximetry). A belt is placed around your belly and a belt is placed around your chest to measure breathing movements. If you have signs of the sleep disorder called sleep apnea during your test, you may get a treatment mask to wear for the second half of the night. The mask provides positive airway pressure (PAP) to help you breathe better during sleep. This may greatly improve your sleep apnea. You will then have all tests done again with the mask in place to see if your measurements and recordings change.      After the test A  medical doctor who specializes in sleep will evaluate the results of your sleep study and share them with you and your primary health care provider. Based on your results, your medical history, and a physical exam, you may be diagnosed with a sleep disorder, such as: Sleep apnea. Restless legs syndrome. Sleep-related behavior disorder. Sleep-related movement disorders. Sleep-related seizure disorders. Your health care team will help determine your treatment options based on your diagnosis. This may include: Improving your sleep habits (sleep hygiene). Wearing a continuous positive airway pressure (CPAP) or bi-level positive airway pressure (BPAP) mask. Wearing an oral device at night to improve breathing and reduce snoring. Taking medicines. Follow these instructions at home: Take over-the-counter and prescription medicines only as told by your health care provider. If you are instructed to use a CPAP or BPAP mask, make sure you use it nightly as directed. Make any lifestyle changes that your health care provider recommends. If you were given a device to open your airway while you sleep, use it only as told by your health care provider. Do not use any tobacco products, such as cigarettes, chewing tobacco, and e-cigarettes. If you need help quitting, ask your health care provider. Keep all follow-up visits as told by your health care provider. This is important. Summary A sleep study (polysomnogram) is a series of tests done while you are sleeping. It shows how well you sleep. Most sleep studies are done over one full night of sleep. You will arrive at the study center in the evening and go home in the morning. If you have signs of the sleep disorder called sleep apnea during your test, you may get a treatment mask to wear for the second half of the night. A medical doctor who specializes in sleep will evaluate the results  of your sleep study and share them with your primary health care  provider. This information is not intended to replace advice given to you by your health care provider. Make sure you discuss any questions you have with your health care provider. Document Revised: 06/19/2019 Document Reviewed: 06/11/2017 Elsevier Patient Education  2021 [employer].  Echocardiogram An echocardiogram is a test that uses sound waves (ultrasound) to produce images of the heart. Images from an echocardiogram can provide important information about: Heart size and shape. The size and thickness and movement of your heart's walls. Heart muscle function and strength. Heart valve function or if you have stenosis. Stenosis is when the heart valves are too narrow. If blood is flowing backward through the heart valves (regurgitation). A tumor or infectious growth around the heart valves. Areas of heart muscle that are not working well because of poor blood flow or injury from a heart attack. Aneurysm detection. An aneurysm is a weak or damaged part of an artery wall. The wall bulges out from the normal force of blood pumping through the body. Tell a health care provider about: Any allergies you have. All medicines you are taking, including vitamins, herbs, eye drops, creams, and over-the-counter medicines. Any blood disorders you have. Any surgeries you have had. Any medical conditions you have. Whether you are pregnant or may be pregnant. What are the risks? Generally, this is a safe test. However, problems may occur, including an allergic reaction to dye (contrast) that may be used during the test. What happens before the test? No specific preparation is needed. You may eat and drink normally. What happens during the test? You will take off your clothes from the waist up and put on a hospital gown. Electrodes or electrocardiogram (ECG)patches may be placed on your chest. The electrodes or patches are then connected to a device that monitors your heart rate and rhythm. You  will lie down on a table for an ultrasound exam. A gel will be applied to your chest to help sound waves pass through your skin. A handheld device, called a transducer, will be pressed against your chest and moved over your heart. The transducer produces sound waves that travel to your heart and bounce back (or "echo" back) to the transducer. These sound waves will be captured in real-time and changed into images of your heart that can be viewed on a video monitor. The images will be recorded on a computer and reviewed by your health care provider. You may be asked to change positions or hold your breath for a short time. This makes it easier to get different views or better views of your heart. In some cases, you may receive contrast through an IV in one of your veins. This can improve the quality of the pictures from your heart. The procedure may vary among health care providers and hospitals.   What can I expect after the test? You may return to your normal, everyday life, including diet, activities, and medicines, unless your health care provider tells you not to do that. Follow these instructions at home: It is up to you to get the results of your test. Ask your health care provider, or the department that is doing the test, when your results will be ready. Keep all follow-up visits. This is important. Summary An echocardiogram is a test that uses sound waves (ultrasound) to produce images of the heart. Images from an echocardiogram can provide important information about the size and shape of your  heart, heart muscle function, heart valve function, and other possible heart problems. You do not need to do anything to prepare before this test. You may eat and drink normally. After the echocardiogram is completed, you may return to your normal, everyday life, unless your health care provider tells you not to do that. This information is not intended to replace advice given to you by your health  care provider. Make sure you discuss any questions you have with your health care provider. Document Revised: 01/05/2020 Document Reviewed: 01/05/2020 Elsevier Patient Education  2021 [place].      Adopting a Healthy Lifestyle.  Know what a healthy weight is for you (roughly BMI <25) and aim to maintain this   Aim for 7+ servings of fruits and vegetables daily   65-80+ fluid ounces of water or unsweet tea for healthy kidneys   Limit to max 1 drink of alcohol per day; avoid smoking/tobacco   Limit animal fats in diet for cholesterol and heart health - choose grass fed whenever available   Avoid highly processed foods, and foods high in saturated/trans fats   Aim for low stress - take time to unwind and care for your mental health   Aim for 150 min of moderate intensity exercise weekly for heart health, and weights twice weekly for bone health   Aim for 7-9 hours of sleep daily   When it comes to diets, agreement about the perfect plan isnt easy to find, even among the experts. Experts at the [employer] developed an idea known as the Healthy Eating Plate. Just imagine a plate divided into logical, healthy portions.   The emphasis is on diet quality:   Load up on vegetables and fruits - one-half of your plate: Aim for color and variety, and remember that potatoes dont count.   Go for whole grains - one-quarter of your plate: Whole wheat, barley, wheat berries, quinoa, oats, brown rice, and foods made with them. If you want pasta, go with whole wheat pasta.   Protein power - one-quarter of your plate: Fish, chicken, beans, and nuts are all healthy, versatile protein sources. Limit red meat.   The diet, however, does go beyond the plate, offering a few other suggestions.   Use healthy plant oils, such as olive, canola, soy, corn, sunflower and peanut. Check the labels, and avoid partially hydrogenated oil, which have unhealthy trans fats.   If youre  thirsty, drink water. Coffee and tea are good in moderation, but skip sugary drinks and limit milk and dairy products to one or two daily servings.   The type of carbohydrate in the diet is more important than the amount. Some sources of carbohydrates, such as vegetables, fruits, whole grains, and beans-are healthier than others.   Finally, stay active  Signed, Berniece Salines, DO  07/11/2020 9:23 AM    [facility]

## 2020-07-11 NOTE — Progress Notes (Signed)
ND    2       Jodi Norman Female, 25 y.o., 12-Aug-1995  MRN:  [id number] Phone:  [phone] (M)       PCP:  Carollee Herter, Alferd Apa, DO Coverage:  [employer]  Next Appt With Radiology ([facility] 1) 07/22/2020 at 9:00 AM           RE: Biopsy Received: Today  Message Details  Suttle, Jodi Ashing, MD  Jodi Norman E Approved for CT guided left iliac bone marrow biopsy. Multifocal sclerotic osseous lesions, presumed metastases of unknown primary. While most lesions are sclerotic, the most hypermetabolic focus is in left iliac which is lytic on CT correlate. Image 283 on PET fusion.   Jodi Norman    Previous Messages  ----- Message -----  From: Jodi Norman  Sent: 07/08/2020  4:21 PM EST  To: Ir Procedure Requests  Subject: Biopsy                      Procedure Requested: CT Biopsy    Reason for Procedure: Positive PET scan with multiple bony lesions. No obvious reason for her to have cancer. We need tissue    Provider Requesting: Dr Burney Gauze  Provider Telephone: [phone]    Other Info:

## 2020-07-11 NOTE — Progress Notes (Signed)
EKG EKG

## 2020-07-12 ENCOUNTER — Other Ambulatory Visit: Payer: Self-pay

## 2020-07-12 LAB — HEMOGLOBIN A1C
Est. average glucose Bld gHb Est-mCnc: 114 mg/dL
Hgb A1c MFr Bld: 5.6 % (ref 4.8–5.6)

## 2020-07-12 LAB — VITAMIN D 25 HYDROXY (VIT D DEFICIENCY, FRACTURES): Vit D, 25-Hydroxy: 14.5 ng/mL — ABNORMAL LOW (ref 30.0–100.0)

## 2020-07-12 MED ORDER — VITAMIN D (ERGOCALCIFEROL) 1.25 MG (50000 UNIT) PO CAPS: 50000.0000 [IU] | ORAL_CAPSULE | ORAL | 0 refills | Status: DC

## 2020-07-12 NOTE — Progress Notes (Signed)
Prescription sent to pharmacy.

## 2020-07-14 ENCOUNTER — Telehealth: Payer: Self-pay | Admitting: Family Medicine

## 2020-07-14 ENCOUNTER — Telehealth: Payer: Self-pay | Admitting: *Deleted

## 2020-07-14 NOTE — Telephone Encounter (Signed)
PA request for sleep study submitted via phone call. Pending Auth reference # [id number].

## 2020-07-14 NOTE — Addendum Note (Signed)
Addended by: Kem Boroughs D on: 07/14/2020 11:50 AM   Modules accepted: Orders

## 2020-07-14 NOTE — Telephone Encounter (Signed)
Faxed in document to be filled out by provider [employer] FMLA - 5 pages) Document put at front office tray under providers name.

## 2020-07-15 ENCOUNTER — Other Ambulatory Visit: Payer: Self-pay | Admitting: Family

## 2020-07-15 NOTE — Telephone Encounter (Signed)
Called pt. LVM to call back to schedule a virtual to fill out FMLA forms.

## 2020-07-18 ENCOUNTER — Telehealth: Payer: Self-pay | Admitting: *Deleted

## 2020-07-18 ENCOUNTER — Encounter: Payer: Self-pay | Admitting: Neurology

## 2020-07-18 NOTE — Telephone Encounter (Signed)
Patient notified of sleep study appointment details.

## 2020-07-19 ENCOUNTER — Other Ambulatory Visit: Payer: Self-pay | Admitting: Family Medicine

## 2020-07-19 DIAGNOSIS — R948 Abnormal results of function studies of other organs and systems: Secondary | ICD-10-CM

## 2020-07-19 NOTE — Telephone Encounter (Signed)
Please advise 

## 2020-07-19 NOTE — Telephone Encounter (Signed)
I placed a order for pelvic US down stairs  She can call them to schedule it

## 2020-07-21 ENCOUNTER — Other Ambulatory Visit: Payer: Self-pay | Admitting: Student

## 2020-07-21 ENCOUNTER — Other Ambulatory Visit: Payer: Self-pay | Admitting: Radiology

## 2020-07-22 ENCOUNTER — Other Ambulatory Visit: Payer: Self-pay

## 2020-07-22 ENCOUNTER — Ambulatory Visit (HOSPITAL_COMMUNITY)
Admission: RE | Admit: 2020-07-22 | Discharge: 2020-07-22 | Disposition: A | Discharge status: Home / Self Care | Payer: Medicaid Other | Source: Ambulatory Visit | Attending: Hematology & Oncology | Admitting: Hematology & Oncology

## 2020-07-22 ENCOUNTER — Encounter: Payer: Self-pay | Admitting: *Deleted

## 2020-07-22 ENCOUNTER — Encounter (HOSPITAL_COMMUNITY): Payer: Self-pay

## 2020-07-22 ENCOUNTER — Telehealth: Payer: Self-pay | Admitting: Family Medicine

## 2020-07-22 DIAGNOSIS — D649 Anemia, unspecified: Secondary | ICD-10-CM | POA: Diagnosis not present

## 2020-07-22 DIAGNOSIS — D7589 Other specified diseases of blood and blood-forming organs: Secondary | ICD-10-CM | POA: Diagnosis not present

## 2020-07-22 DIAGNOSIS — M899 Disorder of bone, unspecified: Secondary | ICD-10-CM | POA: Insufficient documentation

## 2020-07-22 DIAGNOSIS — Z8616 Personal history of COVID-19: Secondary | ICD-10-CM | POA: Insufficient documentation

## 2020-07-22 DIAGNOSIS — F411 Generalized anxiety disorder: Secondary | ICD-10-CM | POA: Insufficient documentation

## 2020-07-22 DIAGNOSIS — M898X9 Other specified disorders of bone, unspecified site: Secondary | ICD-10-CM

## 2020-07-22 LAB — CBC
HCT: 35.2 % — ABNORMAL LOW (ref 36.0–46.0)
Hemoglobin: 11.7 g/dL — ABNORMAL LOW (ref 12.0–15.0)
MCH: 27.9 pg (ref 26.0–34.0)
MCHC: 33.2 g/dL (ref 30.0–36.0)
MCV: 83.8 fL (ref 80.0–100.0)
Platelets: 273 10*3/uL (ref 150–400)
RBC: 4.2 MIL/uL (ref 3.87–5.11)
RDW: 13.4 % (ref 11.5–15.5)
WBC: 5.5 10*3/uL (ref 4.0–10.5)
nRBC: 0 % (ref 0.0–0.2)

## 2020-07-22 LAB — PROTIME-INR
INR: 1 (ref 0.8–1.2)
Prothrombin Time: 12.5 seconds (ref 11.4–15.2)

## 2020-07-22 LAB — HCG, SERUM, QUALITATIVE: Preg, Serum: NEGATIVE

## 2020-07-22 IMAGING — CT CT BIOPSY AND ASPIRATION BONE MARROW
1 of 2 series · 15 of 31 positions shown, 19 images · non-contrast
Comparison: none

INDICATION: 24-year-old female with unknown metastatic disease. She presents for
bone marrow biopsy

[Series 2: i-spiral 5.0 br40 · axial · 0.98mm/px · z∈[+1242,+1340]mm · 15 of 32 slices shown, 19 images]
[im 2/32  mediastinal]
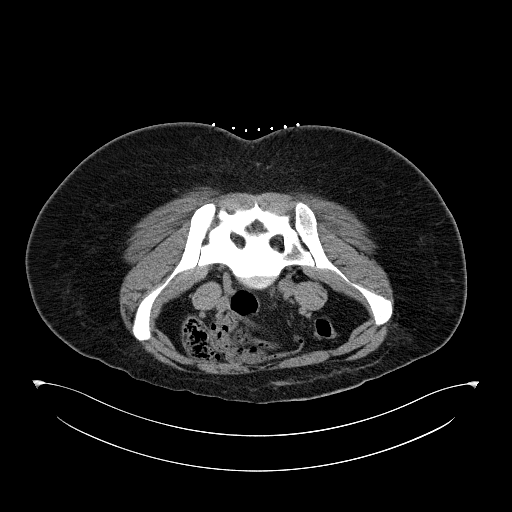
[im 2/32  lung]
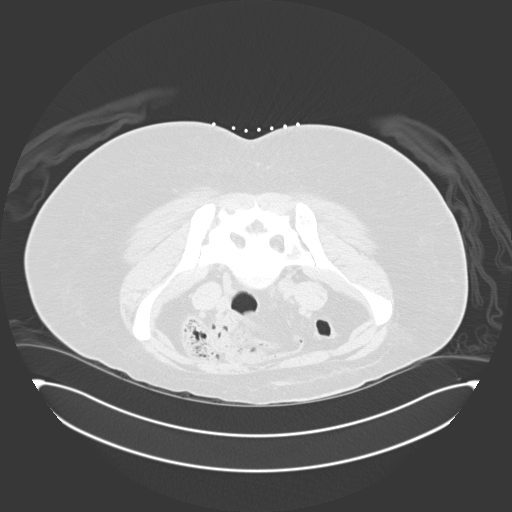
[im 5/32  lung]
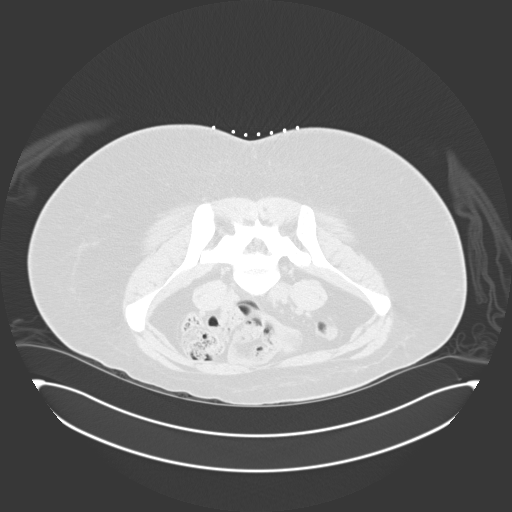
[im 7/32  lung]
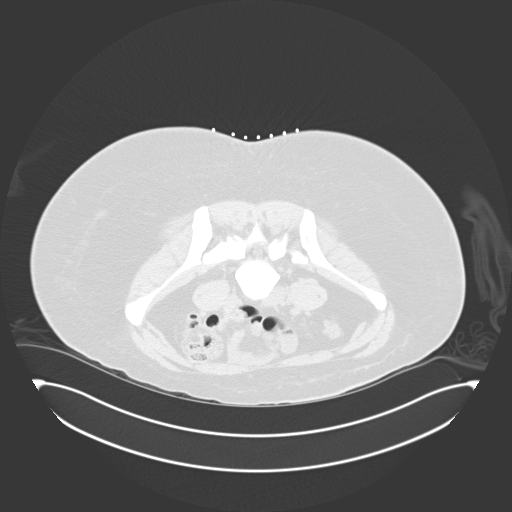
[im 8/32  lung]
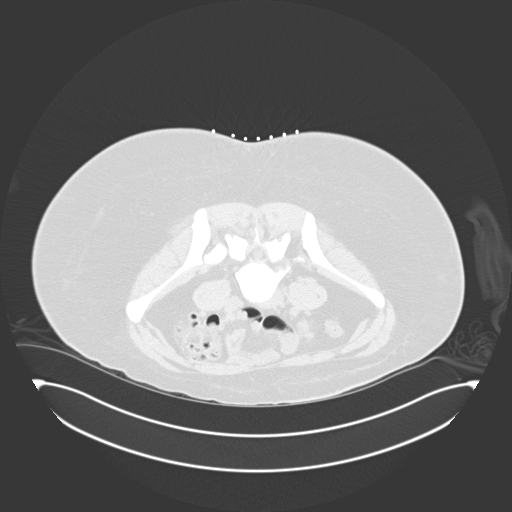
[im 10/32  mediastinal]
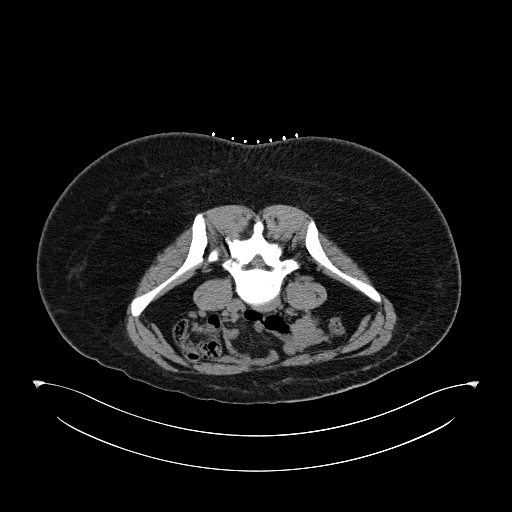
[im 10/32  lung]
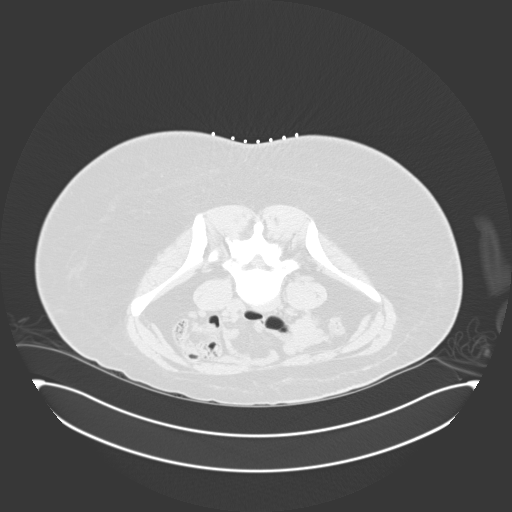
[im 13/32  lung]
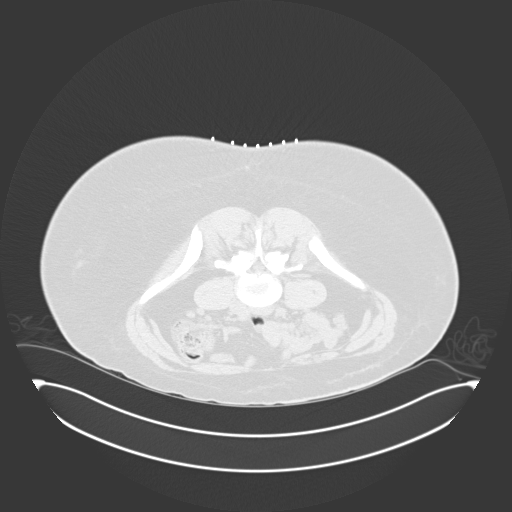
[im 14/32  lung]
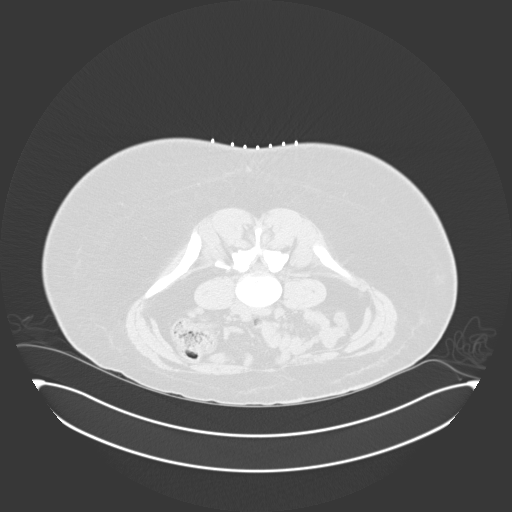
[im 16/32  lung]
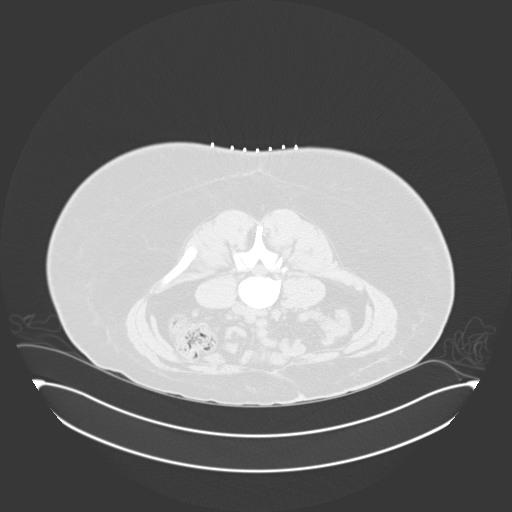
[im 18/32  mediastinal]
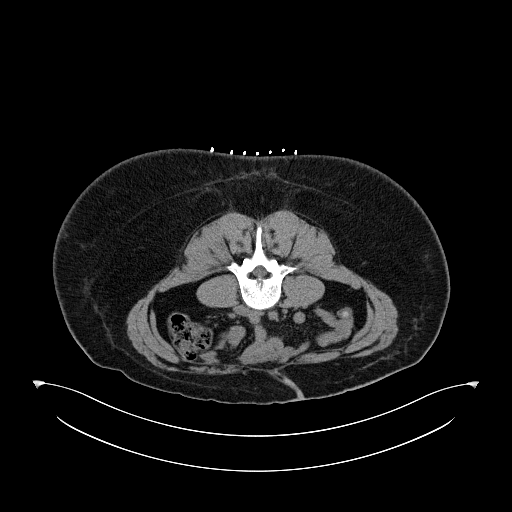
[im 18/32  lung]
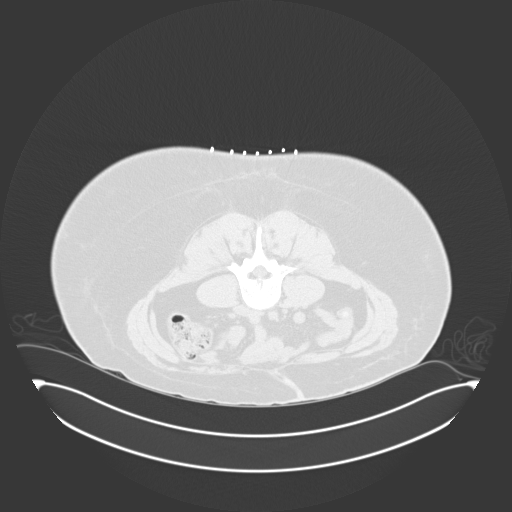
[im 19/32  lung]
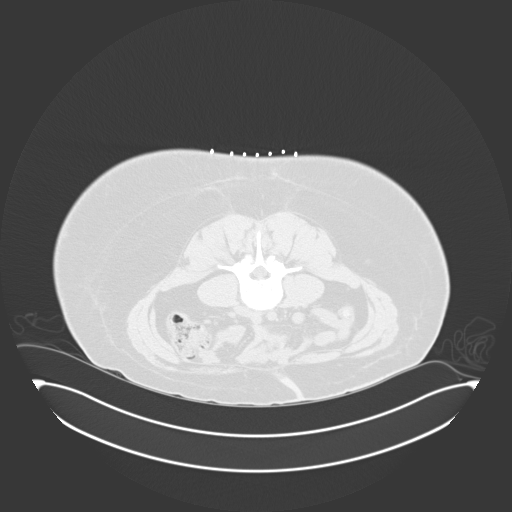
[im 22/32  lung]
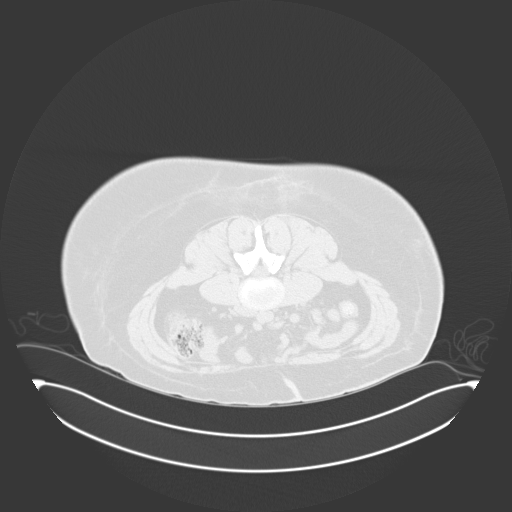
[im 24/32  lung]
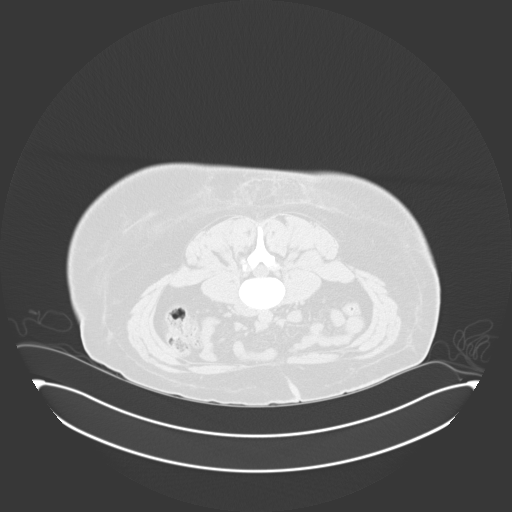
[im 25/32  mediastinal]
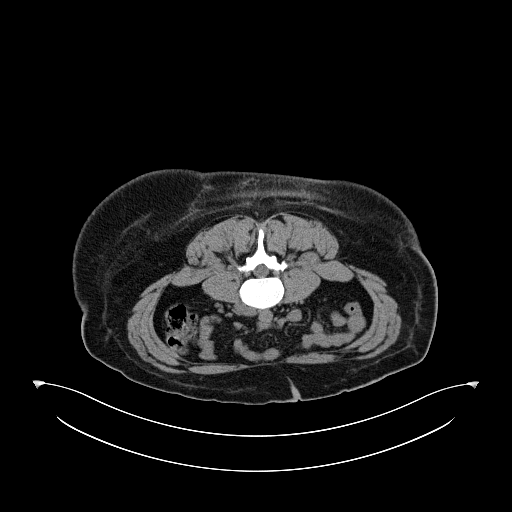
[im 25/32  lung]
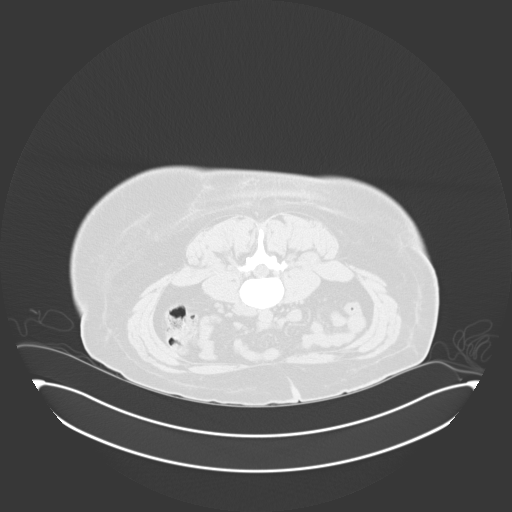
[im 27/32  lung]
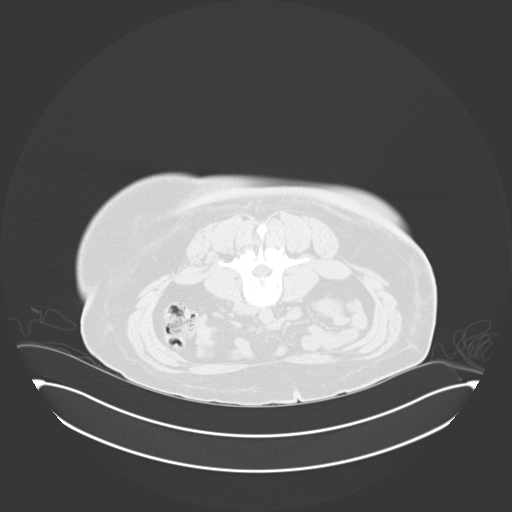
[im 30/32  lung]
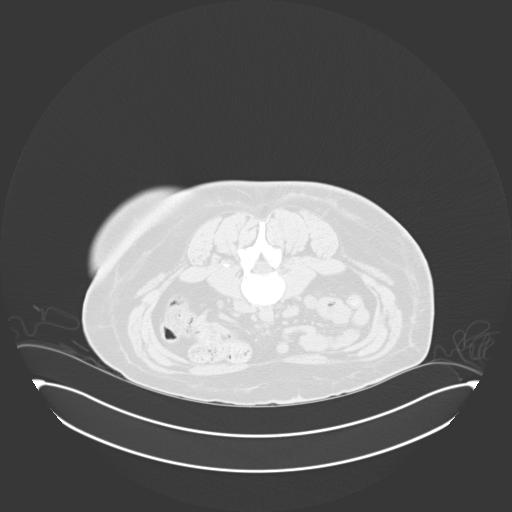

[15 of 31 positions shown; findings below may reference images not displayed]

EXAM:
CT BIOPSY; CT BONE MARROW BIOPSY AND ASPIRATION

MEDICATIONS:
None.

ANESTHESIA/SEDATION:
Moderate (conscious) sedation was employed during this procedure. A
total of Versed 4.0 mg and Fentanyl 100 mcg was administered
intravenously.

Moderate Sedation Time: 10 minutes. The patient's level of
consciousness and vital signs were monitored continuously by
radiology nursing throughout the procedure under my direct
supervision.

FLUOROSCOPY TIME:  CT

COMPLICATIONS:
None

PROCEDURE:
The procedure risks, benefits, and alternatives were explained to
the patient. Questions regarding the procedure were encouraged and
answered. The patient understands and consents to the procedure.

Scout CT of the pelvis was performed for surgical planning purposes.

The left posterior pelvis was prepped with Chlorhexidine in a
sterile fashion, and a sterile drape was applied covering the
operative field. A sterile gown and sterile gloves were used for the
procedure. Local anesthesia was provided with 1% Lidocaine.

Left posterior iliac bone was targeted for biopsy. The skin and
subcutaneous tissues were infiltrated with 1% lidocaine without
epinephrine. A small stab incision was made with an 11 blade
scalpel, and an 11 gauge ARISSA needle was advanced with CT guidance
to the posterior cortex. Manual forced was used to advance the
needle through the posterior cortex and the stylet was removed. A
bone marrow aspirate was retrieved and passed to a cytotechnologist
in the room. The ARISSA needle was then advanced without the stylet
for a core biopsy. The core biopsy was retrieved and also passed to
a cytotechnologist.

Manual pressure was used for hemostasis and a sterile dressing was
placed.

No complications were encountered no significant blood loss was
encountered.

Patient tolerated the procedure well and remained hemodynamically
stable throughout.
IMPRESSION: Status post CT-guided bone marrow biopsy, with tissue specimen sent
to pathology for complete histopathologic analysis

## 2020-07-22 MED ORDER — SODIUM CHLORIDE 0.9 % IV SOLN: INTRAVENOUS | Status: DC

## 2020-07-22 MED ORDER — FENTANYL CITRATE (PF) 100 MCG/2ML IJ SOLN
INTRAMUSCULAR | Status: AC | PRN
  Administered 2020-07-22 (×2): 50 ug via INTRAVENOUS

## 2020-07-22 MED ORDER — MIDAZOLAM HCL 2 MG/2ML IJ SOLN
INTRAMUSCULAR | Status: AC | PRN
  Administered 2020-07-22 (×4): 1 mg via INTRAVENOUS

## 2020-07-22 MED ORDER — LIDOCAINE HCL (PF) 1 % IJ SOLN
INTRAMUSCULAR | Status: AC | PRN
  Administered 2020-07-22: 15 mL

## 2020-07-22 MED ORDER — DIPHENHYDRAMINE HCL 50 MG/ML IJ SOLN
INTRAMUSCULAR | Status: AC | PRN
  Administered 2020-07-22: 25 mg via INTRAVENOUS

## 2020-07-22 MED ORDER — DIPHENHYDRAMINE HCL 50 MG/ML IJ SOLN
INTRAMUSCULAR | Status: AC
  Filled 2020-07-22: qty 1

## 2020-07-22 MED ORDER — MIDAZOLAM HCL 2 MG/2ML IJ SOLN
INTRAMUSCULAR | Status: AC
  Filled 2020-07-22: qty 4

## 2020-07-22 MED ORDER — FENTANYL CITRATE (PF) 100 MCG/2ML IJ SOLN
INTRAMUSCULAR | Status: AC
  Filled 2020-07-22: qty 4

## 2020-07-22 NOTE — Telephone Encounter (Signed)
I called patient and lvm on the 18th advising for a virtual to fill out FMLA. Can you attempt to call her again please?

## 2020-07-22 NOTE — Consult Note (Addendum)
Chief Complaint: Patient was seen in consultation today for CT guided bone marrow biopsy  Referring Physician(s): Ennever,Peter R  Supervising Physician: Corrie Mckusick  Patient Status: [facility] - Out-pt  History of Present Illness: Jodi Norman is a 25 y.o. 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  She has no prior history of cancer.  She presents today for CT-guided bone marrow biopsy further evaluation.   Past Medical History:  Diagnosis Date  . Generalized anxiety disorder 06/24/2020    History reviewed. No pertinent surgical history.  Allergies: Patient has no known allergies.  Medications: Prior to Admission medications   Medication Sig Start Date End Date Taking? Authorizing Provider  amitriptyline (ELAVIL) 10 MG tablet Take 1 tablet (10 mg total) by mouth at bedtime as needed for sleep. 07/08/20  Yes Roma Schanz R, DO  escitalopram (LEXAPRO) 10 MG tablet Take 10 mg by mouth daily. 06/16/20  Yes [provider]  ibuprofen (ADVIL) 800 MG tablet Take 1 tablet (800 mg total) by mouth 3 (three) times daily as needed. 01/13/20  Yes Walisiewicz, Kaitlyn E, PA-C  LORazepam (ATIVAN) 0.5 MG tablet Take 1 tablet (0.5 mg total) by mouth 2 (two) times daily as needed for anxiety. 07/08/20  Yes Ann Held, DO  Vitamin D, Ergocalciferol, (DRISDOL) 1.25 MG (50000 UNIT) CAPS capsule Take 1 capsule (50,000 Units total) by mouth every 7 (seven) days. 07/12/20  Yes Tobb, Kardie, DO  famotidine (PEPCID) 20 MG tablet Take 20 mg by mouth in the morning and at bedtime.    [provider]  hydrOXYzine (ATARAX/VISTARIL) 25 MG tablet Take 1 tablet (25 mg total) by mouth every 6 (six) hours as needed. 06/22/20   Lucrezia Starch, MD  Prenatal MV-Min-Fe Fum-FA-DHA (PRENATAL 1 PO) Take 1 tablet by mouth daily.    [provider]     Family History  Problem Relation Age of Onset  . Hyperlipidemia Mother   . Hypertension Mother   . Hyperlipidemia Father   . Hypertension Father   . Diabetes Father     Social History   Socioeconomic History  . Marital status: Single    Spouse name: Not on file  . Number of children: Not on file  . Years of education: Not on file  . Highest education level: Not on file  Occupational History  . Not on file  Tobacco Use  . Smoking status: Never Smoker  . Smokeless tobacco: Never Used  Substance and Sexual Activity  . Alcohol use: Never  . Drug use: Never  . Sexual activity: Not Currently    Birth control/protection: None  Other Topics Concern  . Not on file  Social History Narrative  . Not on file   Social Determinants of Health   Financial Resource Strain: Not on file  Food Insecurity: Not on file  Transportation Needs: Not on file  Physical Activity: Not on file  Stress: Not on file  Social Connections: Not on file      Review of Systems denies fever, headache, chest pain, dyspnea, cough, abdominal/back pain, nausea, vomiting or bleeding.  She is very anxious.  Vital Signs: BP 122/62   Pulse 86   Temp  97.9 F (36.6 C) (Oral)   Resp 18   LMP 07/15/2020 (Exact Date)   SpO2 99%   Physical Exam awake, alert.  Chest clear to auscultation bilaterally.  Heart with regular rate and rhythm.  Abdomen soft, positive bowel sounds, nontender.  No lower extremity edema  Imaging: CT Angio Chest PE W and/or Wo Contrast  Result Date: 06/22/2020 CLINICAL DATA:  25 year old female with chest pain and shortness of breath. EXAM: CT ANGIOGRAPHY CHEST WITH CONTRAST TECHNIQUE: Multidetector CT imaging of the chest was  performed using the standard protocol during bolus administration of intravenous contrast. Multiplanar CT image reconstructions and MIPs were obtained to evaluate the vascular anatomy. CONTRAST:  121m OMNIPAQUE IOHEXOL 350 MG/ML SOLN COMPARISON:  Chest radiograph dated 06/22/2020. FINDINGS: Cardiovascular: There is no cardiomegaly or pericardial effusion. The thoracic aorta is unremarkable. The origins of the great vessels of the aortic arch appear patent. Evaluation of the pulmonary arteries is limited due to respiratory motion artifact. No pulmonary artery embolus identified. Mediastinum/Nodes: There is no hilar adenopathy. Mildly enlarged lymph node in the lower neck to the right of the trachea (17/7) measuring 12 mm in short axis. Clinical correlation and further evaluation with ultrasound on a nonemergent/outpatient basis recommended. The esophagus is grossly unremarkable. No mediastinal fluid collection. Lungs/Pleura: The lungs are clear. There is no pleural effusion pneumothorax. Central airways are patent. Upper Abdomen: No acute abnormality. Musculoskeletal: There are sclerotic changes involving the body of the sternum as well as sclerotic changes of T4 and T9 vertebra. Additional smaller sclerotic changes involving T7 and L1 most consistent with metastatic disease. Additional osseous metastasis involving the clavicular heads and ribs. No acute osseous pathology. Review of the MIP images confirms the above findings. IMPRESSION: 1. No CT evidence of pulmonary embolism. 2. Osseous sclerotic metastatic disease. Correlation with history of primary malignancy recommended. 3. Mildly enlarged lymph node in the lower neck to the right of the trachea concerning for metastatic disease. Electronically Signed   By: AAnner CreteM.D.   On: 06/22/2020 23:20   MR Brain W Wo Contrast  Result Date: 06/25/2020 CLINICAL DATA:  Dizziness and headache.  Rule out tumor. EXAM: MRI HEAD WITHOUT AND WITH CONTRAST  TECHNIQUE: Multiplanar, multiecho pulse sequences of the brain and surrounding structures were obtained without and with intravenous contrast. CONTRAST:  997mGADAVIST GADOBUTROL 1 MMOL/ML IV SOLN COMPARISON:  None. FINDINGS: Brain: No acute infarction, hemorrhage, hydrocephalus, extra-axial collection or mass lesion. Mild white matter changes in the frontal lobes left greater than right. Lesions are in the deep white matter. Sparing of the periventricular white matter. Brainstem and cerebellum normal. No enhancing metastatic deposits identified in the brain. Vascular: Normal arterial flow voids Skull and upper cervical spine: No focal skeletal lesion identified Sinuses/Orbits: Mild mucosal edema paranasal sinuses. Negative orbit Other: None IMPRESSION: Negative for metastatic disease to the brain.  No acute abnormality Mild white matter changes which may be due to chronic ischemia, complex migraine headache, or possibly demyelinating disease. Electronically Signed   By: ChFranchot Gallo.D.   On: 06/25/2020 11:12   NM PET Image Initial (PI) Whole Body  Result Date: 07/07/2020 CLINICAL DATA:  Initial treatment strategy for osseous metastatic disease. No known primary. EXAM: NUCLEAR MEDICINE PET WHOLE BODY TECHNIQUE: 10.1 mCi F-18 FDG was injected intravenously. Full-ring PET imaging was performed from the head to foot after the radiotracer. CT data was obtained and used for attenuation correction and anatomic localization. Fasting blood glucose: 97 mg/dl COMPARISON:  Chest CT  06/22/2020 FINDINGS: Mediastinal blood pool activity: SUV max 1.83 HEAD/NECK: Diffuse symmetric and fairly marked hypermetabolism noted in the posterior nasopharynx, tonsillar regions and in the submandibular glands. There also numerous borderline neck nodes which are moderately hypermetabolic. SUV max is 6.69. This has the appearance of an acute inflammatory process most likely pharyngitis. COVID Omicron infection can give this appearance on  PET-CT. I think it is unlikely this is lymphoma. Incidental CT findings: none CHEST: No breast masses, supraclavicular or axillary adenopathy. No mediastinal or hilar mass or adenopathy. No worrisome pulmonary lesions. Incidental CT findings: none ABDOMEN/PELVIS: No abnormal hypermetabolic activity within the liver, pancreas, adrenal glands, or spleen. No hypermetabolic lymph nodes in the abdomen or pelvis. There is a focus of hypermetabolism along the medial aspect of the left ovary. I do not see any definite abnormality on the CT scan. SUV max is 6.38. Pelvic ultrasound may be helpful for further evaluation. Incidental CT findings: none SKELETON: Diffuse sclerotic metastatic bone disease as demonstrated on the prior CT scan. Significant disease involving the pelvis with large areas of sclerosis. SUV max in the left hemipelvis is 4.06. There are also several scattered small subcutaneous nodules which are weakly hypermetabolic. There is a small lesion involving the anterior abdominal wall at the level of the ASIS which has an SUV max of 9.59. Incidental CT findings: none EXTREMITIES: Moderate uptake is noted in the right teres minor muscle which could be related to an injection. Scattered small subcutaneous lesions are noted involving the abdominal wall area. I do not see any other definite extremity lesions. Incidental CT findings: none IMPRESSION: 1. 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 No pathologic fracture or spinal canal compromise. 2. No obvious primary neoplastic process is identified. 3. Diffuse hypermetabolism involving the pharynx and tonsils along with hypermetabolic lymph nodes. I think this is most likely an acute inflammatory process possibly COVID. 4. Scattered mildly hypermetabolic subcutaneous nodules, possibly metastatic disease. 5. There is a focus of mild hypermetabolism involving the left ovary. This is much more likely functional than neoplastic. No obvious CT  abnormality. Recommend pelvic ultrasound for further evaluation. Electronically Signed   By: Marijo Sanes M.D.   On: 07/07/2020 10:51   DG Chest Portable 1 View  Result Date: 06/22/2020 CLINICAL DATA:  25 year old female with shortness of breath. EXAM: PORTABLE CHEST 1 VIEW COMPARISON:  None. FINDINGS: No focal consolidation, pleural effusion or pneumothorax. The cardiac silhouette is within limits. No acute osseous pathology. IMPRESSION: No active disease. Electronically Signed   By: Anner Crete M.D.   On: 06/22/2020 19:09    Labs:  CBC: Recent Labs    06/22/20 1857 07/22/20 0710  WBC 6.6 5.5  HGB 12.5 11.7*  HCT 37.2 35.2*  PLT 338 273    COAGS: Recent Labs    07/22/20 0710  INR 1.0    BMP: Recent Labs    06/22/20 1857  NA 137  K 3.2*  CL 102  CO2 21*  GLUCOSE 137*  BUN 8  CALCIUM 9.2  CREATININE 0.68  GFRNONAA >60    LIVER FUNCTION TESTS: Recent Labs    06/22/20 1857  BILITOT 0.2*  AST 27  ALT 25  ALKPHOS 100  PROT 8.3*  ALBUMIN 4.5    TUMOR MARKERS: No results for input(s): AFPTM, CEA, CA199, CHROMGRNA in the last 8760 hours.  Assessment and Plan:  25 y.o. 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  She has no prior history of cancer.  She presents today for CT-guided bone marrow biopsy (likely from lytic left iliac wing area) for further evaluation.Risks and benefits of procedure was discussed with the patient  including, but not limited to bleeding, infection, damage to adjacent structures or low yield requiring additional tests.  All of the questions were answered  and there is agreement to proceed.  Consent signed and in chart.     Thank you for this interesting consult.  I greatly enjoyed meeting Jodi Norman and look forward to participating in their care.  A copy of this report was sent to the requesting provider on this date.  Electronically Signed: D. Rowe Robert, PA-C 07/22/2020, 8:17 AM   I spent a total of  20 minutes   in face to face in clinical consultation, greater than 50% of which was counseling/coordinating care for CT-guided bone marrow biopsy

## 2020-07-22 NOTE — Telephone Encounter (Signed)
Patient is coming in office on the 3rd for an appt....   She would like to take care of forms during that visit as well as the other problem she is coming in for

## 2020-07-22 NOTE — Progress Notes (Signed)
Called patients ride, informed d/c time is 1050. Updated her that patient is doing well, & eating a snack.  Patients mother(her ride) states she is on the way to the hospital.  Informed her to pull up to the main entrance about 1045 for patient pickup. She voiced understanding.

## 2020-07-22 NOTE — Discharge Instructions (Signed)
Bone Marrow Aspiration and Bone Marrow Biopsy, Adult, Care After This sheet gives you information about how to care for yourself after your procedure. Your health care provider may also give you more specific instructions. If you have problems or questions, contact your health care provider. What can I expect after the procedure? After the procedure, it is common to have:  Mild pain and tenderness.  Swelling.  Bruising. Follow these instructions at home: Puncture site care  Follow instructions from your health care provider about how to take care of the puncture site. Make sure you: ? Wash your hands with soap and water before and after you change your bandage (dressing). If soap and water are not available, use hand sanitizer. ? You may remove the bandaid over the puncture site after 24 hours (Saturday morning: 10 am 07/23/20) You do not have to place another bandaid over the site.  Check your puncture site every day for signs of infection. Check for: ? More redness, swelling, or pain. ? Fluid or blood. ? Warmth. ? Pus or a bad smell.   Activity  Return to your normal activities after about 24 hours.  Do not lift anything that is heavier than 10 lb (4.5 kg) for about 1 week.  Do not drive for 24 hours if you were given a sedative during your procedure. General instructions  Take over-the-counter and prescription medicines only as told by your health care provider.  Do not take baths, swim, or use a hot tub until your puncture site is healed and scabbed over. Showers are ok for you to continue. You may shower after the first 24 hours.  You may place ice on the puncture site for comfort.  To do this: ? Put ice in a plastic bag. ? Place a towel between your skin and the bag. ? Leave the ice on for 20 minutes, 2-3 times a day.  Keep all follow-up visits as told by your health care provider. This is important.  Your results will be sent to Dr.Ennever's office.  Give them at least  2 full business days to receive the results.  If you haven't heard from them by Tuesday afternoon you may contact Dr. Antonieta Pert office for your results.     Contact a health care provider if:  Your pain is not controlled with medicine.  You have a fever.  You have more redness, swelling, or pain around the puncture site.  You have fluid or blood coming from the puncture site.  Your puncture site feels warm to the touch.  You have pus or a bad smell coming from the puncture site. Summary  After the procedure, it is common to have mild pain, tenderness, swelling, and bruising.  Take over-the-counter and prescription medicines only as told by your health care provider.  Contact a health care provider if you have any signs of infection, such as fluid or blood coming from the puncture site.  Moderate Conscious Sedation, Adult, Care After This sheet gives you information about how to care for yourself after your procedure. Your health care provider may also give you more specific instructions. If you have problems or questions, contact your health care provider. What can I expect after the procedure? After the procedure, it is common to have:  Sleepiness for several hours.  Impaired judgment for several hours.  Difficulty with balance.  Vomiting if you eat too soon. Follow these instructions at home: For the next 24 hours:   Do not participate in activities  where you could fall or become injured.  Do not drive or use machinery.  Do not drink alcohol.  Do not take sleeping pills or medicines that cause drowsiness.  Do not make important decisions or sign legal documents.  Do not take care of children on your own.      Eating and drinking You may return to your normal diet.  Drink enough fluid to keep your urine pale yellow.  If you vomit: ? Drink water, juice, or soup when you can drink without vomiting. ? Make sure you have little or no nausea before eating solid  foods.   General instructions  Have a responsible adult stay with you for the time you are told. It is important to have someone help care for you until you are awake and alert.  Do not smoke.  Keep all follow-up visits as told by your health care provider. This is important. Contact a health care provider if:  You are still sleepy or having trouble with balance after 24 hours.  You feel light-headed.  You keep feeling nauseous or you keep vomiting.  You develop a rash.  You have a fever.  You have redness or swelling around the IV site. Get help right away if:  You have trouble breathing.  You have new-onset confusion at home. Summary  After the procedure, it is common to feel sleepy, have impaired judgment, or feel nauseous if you eat too soon.  Rest after you get home.   Get help right away if you have trouble breathing or new-onset confusion at home.

## 2020-07-22 NOTE — Progress Notes (Signed)
Patient had her biopsy done this morning. She is now at home, and other than feeling "sleepy", she has no complaints. Her mother is with her. Explained to her that we would reach out to her as soon as pathology returned for next steps. She had no questions or concerns.   Oncology Nurse Navigator Documentation  Oncology Nurse Navigator Flowsheets 07/22/2020  Abnormal Finding Date -  Diagnosis Status -  Navigator Follow Up Date: 07/25/2020  Navigator Follow Up Reason: Pathology  Navigator Location [facility]  Referral Date to RadOnc/MedOnc -  Navigator Encounter Type Telephone  Telephone Outgoing Call;Patient Update  Patient Visit Type MedOnc  Treatment Phase Abnormal Scans  Barriers/Navigation Needs Anxiety;Coordination of Care;Education;Personal Conflicts  Education Other  Interventions Psycho-Social Support  Acuity Level 3-Moderate Needs (3-4 Barriers Identified)  Coordination of Care -  Education Method Verbal  Support Groups/Services Friends and Family  Time Spent with Patient 30

## 2020-07-22 NOTE — Procedures (Signed)
Interventional Radiology Procedure Note  Procedure: CT guided aspirate and core biopsy of left iliac bone Complications: None Recommendations: - Bedrest supine x 1 hrs - OTC's PRN  Pain - Follow biopsy results  Signed,  Rachel Samples S. Jetson Pickrel, DO      

## 2020-07-22 NOTE — Telephone Encounter (Signed)
Document faxed to office for provider to fill out (6 pages [employer]) Document put at front office tray under providers name.

## 2020-07-23 ENCOUNTER — Ambulatory Visit (HOSPITAL_BASED_OUTPATIENT_CLINIC_OR_DEPARTMENT_OTHER): Admission: RE | Admit: 2020-07-23 | Payer: Medicaid Other | Source: Ambulatory Visit

## 2020-07-25 ENCOUNTER — Encounter: Payer: Self-pay | Admitting: *Deleted

## 2020-07-25 ENCOUNTER — Ambulatory Visit (HOSPITAL_BASED_OUTPATIENT_CLINIC_OR_DEPARTMENT_OTHER): Admission: RE | Admit: 2020-07-25 | Payer: Medicaid Other | Source: Ambulatory Visit

## 2020-07-25 ENCOUNTER — Encounter: Payer: Self-pay | Admitting: Hematology & Oncology

## 2020-07-25 NOTE — Progress Notes (Signed)
Patient is calling for pathology results. Chart reviewed and pathology has not resulted. Informed her that once pathology was resulted, we would be sure to call her, and that the result would also post to [facility]. We will call pathology today to get an estimated date for expected results.   Oncology Nurse Navigator Documentation  Oncology Nurse Navigator Flowsheets 07/25/2020  Abnormal Finding Date -  Diagnosis Status -  Navigator Follow Up Date: -  Navigator Follow Up Reason: -  Navigator Location [facility]  Referral Date to RadOnc/MedOnc -  Navigator Encounter Type Telephone;Appt/Treatment Plan Review  Telephone Incoming Call  Patient Visit Type MedOnc  Treatment Phase Abnormal Scans  Barriers/Navigation Needs Anxiety;Coordination of Care;Education;Personal Conflicts  Education Other  Interventions Psycho-Social Support  Acuity Level 3-Moderate Needs (3-4 Barriers Identified)  Coordination of Care -  Education Method Verbal  Support Groups/Services Friends and Family  Time Spent with Patient 15

## 2020-07-26 ENCOUNTER — Other Ambulatory Visit (HOSPITAL_BASED_OUTPATIENT_CLINIC_OR_DEPARTMENT_OTHER): Payer: Self-pay | Admitting: Family Medicine

## 2020-07-27 ENCOUNTER — Encounter (HOSPITAL_BASED_OUTPATIENT_CLINIC_OR_DEPARTMENT_OTHER): Payer: Self-pay

## 2020-07-27 ENCOUNTER — Other Ambulatory Visit: Payer: Self-pay | Admitting: Family

## 2020-07-27 ENCOUNTER — Encounter (HOSPITAL_BASED_OUTPATIENT_CLINIC_OR_DEPARTMENT_OTHER): Payer: Medicaid Other

## 2020-07-27 ENCOUNTER — Other Ambulatory Visit: Payer: Self-pay

## 2020-07-27 ENCOUNTER — Encounter: Payer: Self-pay | Admitting: *Deleted

## 2020-07-27 DIAGNOSIS — Z1231 Encounter for screening mammogram for malignant neoplasm of breast: Secondary | ICD-10-CM

## 2020-07-27 NOTE — Progress Notes (Signed)
Pathology reviewed with Dr Marin Olp. Biopsy doesn't show involvement by cancer, but is also not diagnostic of reason for lesions. Dr Marin Olp requests to speak to pathologist for more direction.   Patient called requesting results. Results reviewed with patient. She had many questions which could not be adequately answered by this navigator. She requests that Dr Marin Olp call her and review pathology with her. Message given to Dr Marin Olp with patient request.   Oncology Nurse Navigator Documentation  Oncology Nurse Navigator Flowsheets 07/27/2020  Abnormal Finding Date -  Diagnosis Status -  Navigator Follow Up Date: -  Navigator Follow Up Reason: -  Navigator Location [facility]  Referral Date to RadOnc/MedOnc -  Navigator Encounter Type Pathology Review;Telephone  Telephone Incoming Call;Diagnostic Results  Patient Visit Type MedOnc  Treatment Phase Abnormal Scans  Barriers/Navigation Needs Anxiety;Coordination of Care;Education;Personal Conflicts  Education Other  Interventions Education;Psycho-Social Support  Acuity Level 3-Moderate Needs (3-4 Barriers Identified)  Coordination of Care -  Education Method Verbal  Support Groups/Services Friends and Family  Time Spent with Patient 10

## 2020-07-28 ENCOUNTER — Encounter: Payer: Self-pay | Admitting: Family Medicine

## 2020-07-28 ENCOUNTER — Encounter: Payer: Self-pay | Admitting: *Deleted

## 2020-07-28 ENCOUNTER — Ambulatory Visit (INDEPENDENT_AMBULATORY_CARE_PROVIDER_SITE_OTHER): Payer: Medicaid Other | Admitting: Family Medicine

## 2020-07-28 VITALS — BP 110/80 | HR 96 | Temp 98.0°F | Resp 18 | Ht 67.0 in | Wt 213.4 lb

## 2020-07-28 DIAGNOSIS — J029 Acute pharyngitis, unspecified: Secondary | ICD-10-CM

## 2020-07-28 DIAGNOSIS — Z1159 Encounter for screening for other viral diseases: Secondary | ICD-10-CM

## 2020-07-28 DIAGNOSIS — D869 Sarcoidosis, unspecified: Secondary | ICD-10-CM | POA: Diagnosis not present

## 2020-07-28 DIAGNOSIS — Z0000 Encounter for general adult medical examination without abnormal findings: Secondary | ICD-10-CM

## 2020-07-28 DIAGNOSIS — L0291 Cutaneous abscess, unspecified: Secondary | ICD-10-CM

## 2020-07-28 LAB — LIPID PANEL
Cholesterol: 140 mg/dL (ref 0–200)
HDL: 44.7 mg/dL (ref >39.00)
LDL Cholesterol: 82 mg/dL (ref 0–99)
NonHDL: 95.69
Total CHOL/HDL Ratio: 3
Triglycerides: 67 mg/dL (ref 0.0–149.0)
VLDL: 13.4 mg/dL (ref 0.0–40.0)

## 2020-07-28 LAB — COMPREHENSIVE METABOLIC PANEL
ALT: 9 U/L (ref 0–35)
AST: 13 U/L (ref 0–37)
Albumin: 4.3 g/dL (ref 3.5–5.2)
Alkaline Phosphatase: 101 U/L (ref 39–117)
BUN: 8 mg/dL (ref 6–23)
CO2: 26 mEq/L (ref 19–32)
Calcium: 9.2 mg/dL (ref 8.4–10.5)
Chloride: 105 mEq/L (ref 96–112)
Creatinine, Ser: 0.62 mg/dL (ref 0.40–1.20)
GFR: 124.74 mL/min (ref >60.00)
Glucose, Bld: 87 mg/dL (ref 70–99)
Potassium: 4 mEq/L (ref 3.5–5.1)
Sodium: 138 mEq/L (ref 135–145)
Total Bilirubin: 0.3 mg/dL (ref 0.2–1.2)
Total Protein: 7.8 g/dL (ref 6.0–8.3)

## 2020-07-28 LAB — CBC WITH DIFFERENTIAL/PLATELET
Basophils Absolute: 0 10*3/uL (ref 0.0–0.1)
Basophils Relative: 0.3 % (ref 0.0–3.0)
Eosinophils Absolute: 0 10*3/uL (ref 0.0–0.7)
Eosinophils Relative: 1.2 % (ref 0.0–5.0)
HCT: 34.3 % — ABNORMAL LOW (ref 36.0–46.0)
Hemoglobin: 11.5 g/dL — ABNORMAL LOW (ref 12.0–15.0)
Lymphocytes Relative: 27.1 % (ref 12.0–46.0)
Lymphs Abs: 1 10*3/uL (ref 0.7–4.0)
MCHC: 33.4 g/dL (ref 30.0–36.0)
MCV: 82.9 fl (ref 78.0–100.0)
Monocytes Absolute: 0.5 10*3/uL (ref 0.1–1.0)
Monocytes Relative: 12.4 % — ABNORMAL HIGH (ref 3.0–12.0)
Neutro Abs: 2.2 10*3/uL (ref 1.4–7.7)
Neutrophils Relative %: 59 % (ref 43.0–77.0)
Platelets: 264 10*3/uL (ref 150.0–400.0)
RBC: 4.14 Mil/uL (ref 3.87–5.11)
RDW: 14.5 % (ref 11.5–15.5)
WBC: 3.8 10*3/uL — ABNORMAL LOW (ref 4.0–10.5)

## 2020-07-28 LAB — TSH: TSH: 0.66 u[IU]/mL (ref 0.35–4.50)

## 2020-07-28 MED ORDER — DOXYCYCLINE HYCLATE 100 MG PO TABS: 100.0000 mg | ORAL_TABLET | Freq: Two times a day (BID) | ORAL | 0 refills | Status: DC

## 2020-07-28 NOTE — Patient Instructions (Signed)
Preventive Care 21-25 Years Old, Female Preventive care refers to lifestyle choices and visits with your health care provider that can promote health and wellness. This includes:  A yearly physical exam. This is also called an annual wellness visit.  Regular dental and eye exams.  Immunizations.  Screening for certain conditions.  Healthy lifestyle choices, such as: ? Eating a healthy diet. ? Getting regular exercise. ? Not using drugs or products that contain nicotine and tobacco. ? Limiting alcohol use. What can I expect for my preventive care visit? Physical exam Your health care provider may check your:  Height and weight. These may be used to calculate your BMI (body mass index). BMI is a measurement that tells if you are at a healthy weight.  Heart rate and blood pressure.  Body temperature.  Skin for abnormal spots. Counseling Your health care provider may ask you questions about your:  Past medical problems.  Family's medical history.  Alcohol, tobacco, and drug use.  Emotional well-being.  Home life and relationship well-being.  Sexual activity.  Diet, exercise, and sleep habits.  Work and work environment.  Access to firearms.  Method of birth control.  Menstrual cycle.  Pregnancy history. What immunizations do I need? Vaccines are usually given at various ages, according to a schedule. Your health care provider will recommend vaccines for you based on your age, medical history, and lifestyle or other factors, such as travel or where you work.   What tests do I need? Blood tests  Lipid and cholesterol levels. These may be checked every 5 years starting at age 20.  Hepatitis C test.  Hepatitis B test. Screening  Diabetes screening. This is done by checking your blood sugar (glucose) after you have not eaten for a while (fasting).  STD (sexually transmitted disease) testing, if you are at risk.  BRCA-related cancer screening. This may be  done if you have a family history of breast, ovarian, tubal, or peritoneal cancers.  Pelvic exam and Pap test. This may be done every 3 years starting at age 21. Starting at age 30, this may be done every 5 years if you have a Pap test in combination with an HPV test. Talk with your health care provider about your test results, treatment options, and if necessary, the need for more tests.   Follow these instructions at home: Eating and drinking  Eat a healthy diet that includes fresh fruits and vegetables, whole grains, lean protein, and low-fat dairy products.  Take vitamin and mineral supplements as recommended by your health care provider.  Do not drink alcohol if: ? Your health care provider tells you not to drink. ? You are pregnant, may be pregnant, or are planning to become pregnant.  If you drink alcohol: ? Limit how much you have to 0-1 drink a day. ? Be aware of how much alcohol is in your drink. In the [place] one drink equals one 12 oz bottle of beer (355 mL), one 5 oz glass of wine (148 mL), or one 1 oz glass of hard liquor (44 mL).   Lifestyle  Take daily care of your teeth and gums. Brush your teeth every morning and night with fluoride toothpaste. Floss one time each day.  Stay active. Exercise for at least 30 minutes 5 or more days each week.  Do not use any products that contain nicotine or tobacco, such as cigarettes, e-cigarettes, and chewing tobacco. If you need help quitting, ask your health care provider.  Do not   use drugs.  If you are sexually active, practice safe sex. Use a condom or other form of protection to prevent STIs (sexually transmitted infections).  If you do not wish to become pregnant, use a form of birth control. If you plan to become pregnant, see your health care provider for a prepregnancy visit.  Find healthy ways to cope with stress, such as: ? Meditation, yoga, or listening to music. ? Journaling. ? Talking to a trusted  person. ? Spending time with friends and family. Safety  Always wear your seat belt while driving or riding in a vehicle.  Do not drive: ? If you have been drinking alcohol. Do not ride with someone who has been drinking. ? When you are tired or distracted. ? While texting.  Wear a helmet and other protective equipment during sports activities.  If you have firearms in your house, make sure you follow all gun safety procedures.  Seek help if you have been physically or sexually abused. What's next?  Go to your health care provider once a year for an annual wellness visit.  Ask your health care provider how often you should have your eyes and teeth checked.  Stay up to date on all vaccines. This information is not intended to replace advice given to you by your health care provider. Make sure you discuss any questions you have with your health care provider. Document Revised: 01/10/2020 Document Reviewed: 01/23/2018 Elsevier Patient Education  2021 Elsevier Inc.  

## 2020-07-28 NOTE — Progress Notes (Signed)
Subjective:     Jodi Norman is a 25 y.o. female and is here for a comprehensive physical exam. The patient reports problems - bx neg for cancer but possible sarcoidosis -- she is requesting a pulm referral .  She also c/o sore throat -- [facility] and flu/ strep neg  She also c/o boil in suprapubic area that ruptured this am and is draining-- - they are recurrent  She also brings in fmla paperwork that needs to be filled out --- she needs this for her migraines -- she gets 3-4 x week  And for her anxiety as well -- she starts with a psych next week   Social History   Socioeconomic History  . Marital status: Single    Spouse name: Not on file  . Number of children: Not on file  . Years of education: Not on file  . Highest education level: Not on file  Occupational History  . Not on file  Tobacco Use  . Smoking status: Never Smoker  . Smokeless tobacco: Never Used  Substance and Sexual Activity  . Alcohol use: Never  . Drug use: Never  . Sexual activity: Not Currently    Birth control/protection: None  Other Topics Concern  . Not on file  Social History Narrative  . Not on file   Social Determinants of Health   Financial Resource Strain: Not on file  Food Insecurity: Not on file  Transportation Needs: Not on file  Physical Activity: Not on file  Stress: Not on file  Social Connections: Not on file  Intimate Partner Violence: Not on file   Health Maintenance  Topic Date Due  . Hepatitis C Screening  Never done  . HPV VACCINES (1 - Risk 3-dose series) Never done  . COVID-19 Vaccine (1) Never done  . HIV Screening  Never done  . PAP-Cervical Cytology Screening  Never done  . PAP SMEAR-Modifier  Never done  . INFLUENZA VACCINE  12/27/2019  . TETANUS/TDAP  03/03/2028    The following portions of the patient's history were reviewed and updated as appropriate:  She  has a past medical history of Generalized anxiety disorder (06/24/2020). She does not  have any pertinent problems on file. She  has no past surgical history on file. Her family history includes Diabetes in her father; Hyperlipidemia in her father and mother; Hypertension in her father and mother. She  reports that she has never smoked. She has never used smokeless tobacco. She reports that she does not drink alcohol and does not use drugs. She has a current medication list which includes the following prescription(s): amitriptyline, doxycycline, escitalopram, hydroxyzine, vitamin d (ergocalciferol), famotidine, ibuprofen, and prenatal mv-min-fe fum-fa-dha. Current Outpatient Medications on File Prior to Visit  Medication Sig Dispense Refill  . amitriptyline (ELAVIL) 10 MG tablet Take 1 tablet (10 mg total) by mouth at bedtime as needed for sleep. 30 tablet 1  . escitalopram (LEXAPRO) 10 MG tablet Take 10 mg by mouth daily.    . hydrOXYzine (ATARAX/VISTARIL) 25 MG tablet Take 1 tablet (25 mg total) by mouth every 6 (six) hours as needed. 20 tablet 0  . Vitamin D, Ergocalciferol, (DRISDOL) 1.25 MG (50000 UNIT) CAPS capsule Take 1 capsule (50,000 Units total) by mouth every 7 (seven) days. 12 capsule 0  . famotidine (PEPCID) 20 MG tablet Take 20 mg by mouth in the morning and at bedtime.    Marland Kitchen ibuprofen (ADVIL) 800 MG tablet Take 1 tablet (800  mg total) by mouth 3 (three) times daily as needed. 21 tablet 0  . Prenatal MV-Min-Fe Fum-FA-DHA (PRENATAL 1 PO) Take 1 tablet by mouth daily.     No current facility-administered medications on file prior to visit.   She has No Known Allergies..  Review of Systems Review of Systems  Constitutional: Negative for activity change, appetite change and fatigue.  HENT: Negative for hearing loss, congestion, tinnitus and ear discharge.  dentist q32m Eyes: Negative for visual disturbance (see optho q1y -- vision corrected to 20/20 with glasses).  Respiratory: Negative for cough, chest tightness and shortness of breath.   Cardiovascular: Negative  for chest pain, palpitations and leg swelling.  Gastrointestinal: Negative for abdominal pain, diarrhea, constipation and abdominal distention.  Genitourinary: Negative for urgency, frequency, decreased urine volume and difficulty urinating.  Musculoskeletal: Negative for back pain, arthralgias and gait problem.  Skin: Negative for color change, pallor and rash.  Neurological: Negative for dizziness, light-headedness, numbness and headaches.  Hematological: Negative for adenopathy. Does not bruise/bleed easily.  Psychiatric/Behavioral: Negative for suicidal ideas, confusion, sleep disturbance, self-injury, dysphoric mood, decreased concentration and agitation.       Objective:    BP 110/80 (BP Location: Left Arm, Patient Position: Sitting, Cuff Size: Normal)   Pulse 96   Temp 98 F (36.7 C) (Oral)   Resp 18   Ht 5\' 7"  (1.702 m)   Wt 213 lb 6.4 oz (96.8 kg)   LMP 07/15/2020 (Exact Date)   SpO2 100%   BMI 33.42 kg/m  General appearance: alert, cooperative, appears stated age and no distress Head: Normocephalic, without obvious abnormality, atraumatic Eyes: negative findings: lids and lashes normal, conjunctivae and sclerae normal and pupils equal, round, reactive to light and accomodation Ears: normal TM's and external ear canals both ears Neck: no adenopathy, no carotid bruit, no JVD, supple, symmetrical, trachea midline and thyroid not enlarged, symmetric, no tenderness/mass/nodules Back: symmetric, no curvature. ROM normal. No CVA tenderness. Lungs: clear to auscultation bilaterally Breasts: normal appearance, no masses or tenderness Heart: regular rate and rhythm, S1, S2 normal, no murmur, click, rub or gallop Abdomen: soft, non-tender; bowel sounds normal; no masses,  no organomegaly Pelvic: deferred Extremities: extremities normal, atraumatic, no cyanosis or edema Pulses: 2+ and symmetric Skin: boil suprapubic area---  with surrounding errythema-- draining  Lymph nodes:  Cervical, supraclavicular, and axillary nodes normal. Neurologic: Alert and oriented X 3, normal strength and tone. Normal symmetric reflexes. Normal coordination and gait  Psych---  Anxiety ,  No t suicidal  Assessment:    Healthy female exam.       Plan:    ghm utd Check labs  See After Visit Summary for Counseling Recommendations    1. Sarcoidosis Suspected sarcoid  pulm referral placed for further eval  - Ambulatory referral to Pulmonology  2. Need for hepatitis C screening test   - Hepatitis C antibody  3. Abscess   - doxycycline (VIBRA-TABS) 100 MG tablet; Take 1 tablet (100 mg total) by mouth 2 (two) times daily.  Dispense: 20 tablet; Refill: 0 - TSH - Lipid panel - CBC with Differential/Platelet - Comprehensive metabolic panel  4. Pharyngitis, unspecified etiology Strep neg at [facility] Doxy started for abscess ---- should cover pharyngitis  - TSH - Lipid panel - CBC with Differential/Platelet - Comprehensive metabolic panel  5. Preventative health care See above  - TSH - Lipid panel - CBC with Differential/Platelet - Comprehensive metabolic panel

## 2020-07-28 NOTE — Progress Notes (Signed)
Will bring patient into the office to discuss all results and discuss treatment plan going forward. Patient is aware of appointment, including time, date and location.   Oncology Nurse Navigator Documentation  Oncology Nurse Navigator Flowsheets 07/28/2020  Abnormal Finding Date -  Diagnosis Status -  Navigator Follow Up Date: 08/03/2020  Navigator Follow Up Reason: Follow-up Appointment  Navigator Location [facility]  Referral Date to RadOnc/MedOnc -  Navigator Encounter Type Telephone;Appt/Treatment Plan Review  Telephone Outgoing Call;Appt Confirmation/Clarification  Patient Visit Type MedOnc  Treatment Phase Abnormal Scans  Barriers/Navigation Needs Anxiety;Coordination of Care;Education;Personal Conflicts  Education Other  Interventions Coordination of Care;Education;Psycho-Social Support  Acuity Level 3-Moderate Needs (3-4 Barriers Identified)  Coordination of Care Appts  Education Method Verbal  Support Groups/Services Friends and Family  Time Spent with Patient 70

## 2020-07-29 ENCOUNTER — Telehealth: Payer: Self-pay

## 2020-07-29 LAB — HEPATITIS C ANTIBODY
Hepatitis C Ab: NONREACTIVE
SIGNAL TO CUT-OFF: 0.02 (ref <1.00)

## 2020-07-29 NOTE — Telephone Encounter (Signed)
See labs  Remind her that she gets the results the exact time I do --- but if im seeing patients she needs to give me time to look at them

## 2020-07-29 NOTE — Telephone Encounter (Signed)
Called pt to see who she sees for Psych to complete FMLA. I had to leave a VM on her phone.

## 2020-08-03 ENCOUNTER — Other Ambulatory Visit: Payer: Self-pay

## 2020-08-03 ENCOUNTER — Inpatient Hospital Stay: Payer: Medicaid Other | Attending: Hematology & Oncology | Admitting: Hematology & Oncology

## 2020-08-03 ENCOUNTER — Encounter: Payer: Self-pay | Admitting: *Deleted

## 2020-08-03 ENCOUNTER — Inpatient Hospital Stay: Payer: Medicaid Other

## 2020-08-03 ENCOUNTER — Encounter: Payer: Self-pay | Admitting: Hematology & Oncology

## 2020-08-03 VITALS — BP 119/66 | HR 73 | Temp 98.2°F | Resp 18 | Wt 220.0 lb

## 2020-08-03 DIAGNOSIS — E8881 Metabolic syndrome: Secondary | ICD-10-CM

## 2020-08-03 DIAGNOSIS — R229 Localized swelling, mass and lump, unspecified: Secondary | ICD-10-CM | POA: Insufficient documentation

## 2020-08-03 DIAGNOSIS — Z79899 Other long term (current) drug therapy: Secondary | ICD-10-CM | POA: Diagnosis not present

## 2020-08-03 DIAGNOSIS — D869 Sarcoidosis, unspecified: Secondary | ICD-10-CM | POA: Diagnosis present

## 2020-08-03 DIAGNOSIS — R9389 Abnormal findings on diagnostic imaging of other specified body structures: Secondary | ICD-10-CM | POA: Insufficient documentation

## 2020-08-03 LAB — CBC WITH DIFFERENTIAL (CANCER CENTER ONLY)
Abs Immature Granulocytes: 0.01 10*3/uL (ref 0.00–0.07)
Basophils Absolute: 0 10*3/uL (ref 0.0–0.1)
Basophils Relative: 0 %
Eosinophils Absolute: 0.1 10*3/uL (ref 0.0–0.5)
Eosinophils Relative: 1 %
HCT: 31.2 % — ABNORMAL LOW (ref 36.0–46.0)
Hemoglobin: 10.6 g/dL — ABNORMAL LOW (ref 12.0–15.0)
Immature Granulocytes: 0 %
Lymphocytes Relative: 38 %
Lymphs Abs: 2.1 10*3/uL (ref 0.7–4.0)
MCH: 27.8 pg (ref 26.0–34.0)
MCHC: 34 g/dL (ref 30.0–36.0)
MCV: 81.9 fL (ref 80.0–100.0)
Monocytes Absolute: 0.3 10*3/uL (ref 0.1–1.0)
Monocytes Relative: 6 %
Neutro Abs: 3.1 10*3/uL (ref 1.7–7.7)
Neutrophils Relative %: 55 %
Platelet Count: 243 10*3/uL (ref 150–400)
RBC: 3.81 MIL/uL — ABNORMAL LOW (ref 3.87–5.11)
RDW: 13.4 % (ref 11.5–15.5)
WBC Count: 5.6 10*3/uL (ref 4.0–10.5)
nRBC: 0 % (ref 0.0–0.2)

## 2020-08-03 LAB — CMP (CANCER CENTER ONLY)
ALT: 9 U/L (ref 0–44)
AST: 13 U/L — ABNORMAL LOW (ref 15–41)
Albumin: 4.2 g/dL (ref 3.5–5.0)
Alkaline Phosphatase: 95 U/L (ref 38–126)
Anion gap: 6 (ref 5–15)
BUN: <5 mg/dL — ABNORMAL LOW (ref 6–20)
CO2: 25 mmol/L (ref 22–32)
Calcium: 9.2 mg/dL (ref 8.9–10.3)
Chloride: 107 mmol/L (ref 98–111)
Creatinine: 0.65 mg/dL (ref 0.44–1.00)
GFR, Estimated: >60 mL/min (ref >60)
Glucose, Bld: 88 mg/dL (ref 70–99)
Potassium: 3.6 mmol/L (ref 3.5–5.1)
Sodium: 138 mmol/L (ref 135–145)
Total Bilirubin: 0.2 mg/dL — ABNORMAL LOW (ref 0.3–1.2)
Total Protein: 7.2 g/dL (ref 6.5–8.1)

## 2020-08-03 LAB — LACTATE DEHYDROGENASE: LDH: 149 U/L (ref 98–192)

## 2020-08-04 ENCOUNTER — Encounter (HOSPITAL_COMMUNITY): Payer: Self-pay | Admitting: Hematology & Oncology

## 2020-08-04 ENCOUNTER — Telehealth: Payer: Self-pay

## 2020-08-04 ENCOUNTER — Other Ambulatory Visit: Payer: Self-pay | Admitting: Family Medicine

## 2020-08-04 DIAGNOSIS — G4700 Insomnia, unspecified: Secondary | ICD-10-CM

## 2020-08-04 DIAGNOSIS — F419 Anxiety disorder, unspecified: Secondary | ICD-10-CM

## 2020-08-04 LAB — RHEUMATOID FACTOR: Rheumatoid fact SerPl-aCnc: <10 IU/mL (ref <14.0)

## 2020-08-04 LAB — SURGICAL PATHOLOGY

## 2020-08-04 LAB — ANGIOTENSIN CONVERTING ENZYME: Angiotensin-Converting Enzyme: 39 U/L (ref 14–82)

## 2020-08-04 NOTE — Progress Notes (Signed)
Referral MD  Reason for Referral: Bony lesions seen on scans.  Chief Complaint  Patient presents with  . Follow-up  : I do not know if I have cancer.  HPI: Ms. Warshawsky is a very nice 25 year old Afro-American female.  She has been worked up by [place] previously because of abnormalities on x-rays.  She had chest pain some shortness of breath back in late January.  She had a CT angiogram done.  There is no pulmonary embolism.  However, there was what was noted to be osseous sclerotic changes in her bones.  This involved the sternum as well as T4 and T9.  There is some small changes at T7 and L1.  It was felt that this was malignancy.  At the time, she is not complaining of any bony pain.  She had a PET scan done on 07/07/2020.  There was some mild activity with her bones.  SUV max in the left hemipelvis was 4.06.  She has some subcutaneous nodules which were weekly hypermetabolic.  There was a anterior abdominal wall lesion with an SUV of 9.59.  She had no other symptoms.  There is no fever.  She has had no weight loss.  Her appetite's been good.  She has been working.  Is no change in bowel or bladder habits.  She not noted any change in her breast.  She then underwent a bone marrow biopsy.  This was done on 07/22/2020.  The pathology report ([id number]) showed no obvious malignancy.  There was a rare noncaseating granuloma.  As such, there is some thought about the possibility of sarcoidosis.  We still are in a quandary as to what is going on.  Again she is still asymptomatic.  I just we have a hard time believing that she has metastatic disease to her bones without having any symptoms.  She does not smoke.  She does not drink.  She has a 35-year-old child.  Overall, her performance status is ECOG 0.     Past Medical History:  Diagnosis Date  . Generalized anxiety disorder 06/24/2020  :  History reviewed. No pertinent surgical history.:   Current Outpatient Medications:  .   amitriptyline (ELAVIL) 10 MG tablet, Take 1 tablet (10 mg total) by mouth at bedtime as needed for sleep., Disp: 30 tablet, Rfl: 1 .  clotrimazole-betamethasone (LOTRISONE) cream, clotrimazole-betamethasone 1 %-0.05 % topical cream  APP EXT TO THE AFFECTED AND SURROUNDING AREAS BID IN THE MORNING AND IN THE EVE FOR 2 WKS, Disp: , Rfl:  .  doxycycline (VIBRA-TABS) 100 MG tablet, Take 1 tablet (100 mg total) by mouth 2 (two) times daily., Disp: 20 tablet, Rfl: 0 .  escitalopram (LEXAPRO) 10 MG tablet, Take 10 mg by mouth daily., Disp: , Rfl:  .  fluconazole (DIFLUCAN) 150 MG tablet, Take 150 mg by mouth daily., Disp: , Rfl:  .  hydrOXYzine (ATARAX/VISTARIL) 25 MG tablet, Take 1 tablet (25 mg total) by mouth every 6 (six) hours as needed., Disp: 20 tablet, Rfl: 0 .  metroNIDAZOLE (FLAGYL) 500 MG tablet, Take 500 mg by mouth., Disp: , Rfl:  .  Vitamin D, Ergocalciferol, (DRISDOL) 1.25 MG (50000 UNIT) CAPS capsule, Take 1 capsule (50,000 Units total) by mouth every 7 (seven) days., Disp: 12 capsule, Rfl: 0:  :  No Known Allergies:  Family History  Problem Relation Age of Onset  . Hyperlipidemia Mother   . Hypertension Mother   . Hyperlipidemia Father   . Hypertension Father   . Diabetes  Father   :  Social History   Socioeconomic History  . Marital status: Single    Spouse name: Not on file  . Number of children: Not on file  . Years of education: Not on file  . Highest education level: Not on file  Occupational History  . Not on file  Tobacco Use  . Smoking status: Never Smoker  . Smokeless tobacco: Never Used  Substance and Sexual Activity  . Alcohol use: Never  . Drug use: Never  . Sexual activity: Not Currently    Birth control/protection: None  Other Topics Concern  . Not on file  Social History Narrative  . Not on file   Social Determinants of Health   Financial Resource Strain: Not on file  Food Insecurity: Not on file  Transportation Needs: Not on file  Physical  Activity: Not on file  Stress: Not on file  Social Connections: Not on file  Intimate Partner Violence: Not on file  :  Review of Systems  Constitutional: Negative.   HENT: Negative.   Eyes: Negative.   Respiratory: Negative.   Cardiovascular: Negative.   Gastrointestinal: Negative.   Genitourinary: Negative.   Musculoskeletal: Negative.   Skin: Negative.   Neurological: Negative.   Endo/Heme/Allergies: Negative.   Psychiatric/Behavioral: Negative.      Exam:  Physical Exam Vitals reviewed.  HENT:     Head: Normocephalic and atraumatic.  Eyes:     Pupils: Pupils are equal, round, and reactive to light.  Cardiovascular:     Rate and Rhythm: Normal rate and regular rhythm.     Heart sounds: Normal heart sounds.  Pulmonary:     Effort: Pulmonary effort is normal.     Breath sounds: Normal breath sounds.  Abdominal:     General: Bowel sounds are normal.     Palpations: Abdomen is soft.  Musculoskeletal:        General: No tenderness or deformity. Normal range of motion.     Cervical back: Normal range of motion.  Lymphadenopathy:     Cervical: No cervical adenopathy.  Skin:    General: Skin is warm and dry.     Findings: No erythema or rash.  Neurological:     Mental Status: She is alert and oriented to person, place, and time.  Psychiatric:        Behavior: Behavior normal.        Thought Content: Thought content normal.        Judgment: Judgment normal.    _0 @   Recent Labs    08/03/20 0916  WBC 5.6  HGB 10.6*  HCT 31.2*  PLT 243   Recent Labs    08/03/20 0916  NA 138  K 3.6  CL 107  CO2 25  GLUCOSE 88  BUN <5*  CREATININE 0.65  CALCIUM 9.2    Blood smear review: None   Pathology:  See Above    Assessment and Plan:   Ms. Chicoine is a 25 year old African-American female.  She has a nonspecific bony lesions.  Again, I just have a hard time believing that she has malignancy since she is totally asymptomatic.  I realize that the  bone marrow biopsy was not that definitive.  However, given her African-American background and her young age and being a female, sarcoidosis is certainly a possibility.  I cannot find anything on exam that is specific at all.  We are sending off a ACE level on her which might help with sarcoidosis.  I  do not know if she may have Paget disease.  I am unsure how we can actually get a bone biopsy.  I would think this might be an open type of procedure.  I know that she has a subcutaneous nodule on her PET scan that might be possible to remove.  Again, this is not straightforward.  Again I just have a hard time believing that she would have widespread malignancy without having any symptoms.  I cannot find any obvious risk factors.  In a young woman, you think about breast cancer.  Bone lymphoma as well as possible although again I see nothing with her labs that would suggest this.  We will just have to see how the our work-up progressive.  I know she is anxious about all this.  I totally understand.  We will try to work through all this is quickly as possible.

## 2020-08-04 NOTE — Progress Notes (Signed)
This is patient's first in person visit in this office. She comes in after our workup has not produced a definitive diagnosis. She is wanting to discuss next steps. She is very anxious and wants to know what her diagnosis is, so that she can begin treatment and know what to expect. Dicussed with her possible next steps to get a definitive diagnosis. She has been referred by her PCP to pulmonology for the possible sarcoid diagnosis. She has not yet year from them. I sent a message to [facility] to see about getting her new patient appointment scheduled.   Will follow up early next week for results of todays testing and see what alternations to the treatment plan are made.   Oncology Nurse Navigator Documentation  Oncology Nurse Navigator Flowsheets 08/03/2020  Abnormal Finding Date -  Diagnosis Status -  Navigator Follow Up Date: 08/08/2020  Navigator Follow Up Reason: Appointment Review  Navigator Location [facility]  Referral Date to RadOnc/MedOnc -  Navigator Encounter Type Initial MedOnc  Telephone -  Patient Visit Type MedOnc  Treatment Phase Abnormal Scans  Barriers/Navigation Needs Anxiety;Coordination of Care;Education;Personal Conflicts  Education Other  Interventions Education;Psycho-Social Support  Acuity Level 3-Moderate Needs (3-4 Barriers Identified)  Coordination of Care Other  Education Method Verbal  Support Groups/Services Friends and Family  Time Spent with Patient 49

## 2020-08-04 NOTE — Telephone Encounter (Signed)
No 08/03/20 los   Jodi Norman

## 2020-08-06 LAB — FANA STAINING PATTERNS: Homogeneous Pattern: 1

## 2020-08-06 LAB — ANTINUCLEAR ANTIBODIES, IFA: ANA Ab, IFA: POSITIVE — AB

## 2020-08-08 ENCOUNTER — Ambulatory Visit (HOSPITAL_BASED_OUTPATIENT_CLINIC_OR_DEPARTMENT_OTHER): Admission: RE | Admit: 2020-08-08 | Payer: Medicaid Other | Source: Ambulatory Visit

## 2020-08-16 ENCOUNTER — Encounter: Payer: Self-pay | Admitting: *Deleted

## 2020-08-16 NOTE — Progress Notes (Signed)
Received a message that patient had questions regarding her MRI. Called patient and reviewed MRI results with her. She states that her, and her mother thought that when the results were shared, there was a mention of swelling of the brain. I read the MRI impression which doesn't mention swelling of the brain, but does mention some edema of the sinus'. Patient had an active covid infection at that time and I suggested that result might have been caused by her viral illness. Patient states she continues to have severe headaches and her new neuro consult isn't scheduled until the end of April.   She does ask multiple questions regarding her test results and what diagnoses are still possible. She also asks about what steps are next. I attempted to answer her questions the best I could but she does request that Dr Marin Olp call her. I gave this message to Dr Marin Olp.  Patient was referred to pulmonary by her PCP and still doesn't have an appointment. I sent a message to referral coordinator at her PCP's office and requested that they follow up with this.   Oncology Nurse Navigator Documentation  Oncology Nurse Navigator Flowsheets 08/16/2020  Abnormal Finding Date -  Diagnosis Status -  Navigator Follow Up Date: -  Navigator Follow Up Reason: -  Navigator Location [facility]  Referral Date to RadOnc/MedOnc -  Navigator Encounter Type Telephone  Telephone Outgoing Call  Patient Visit Type MedOnc  Treatment Phase Abnormal Scans  Barriers/Navigation Needs Anxiety;Coordination of Care;Education;Personal Conflicts  Education Other  Interventions Education;Psycho-Social Support  Acuity Level 3-Moderate Needs (3-4 Barriers Identified)  Coordination of Care Other  Education Method Verbal  Support Groups/Services Friends and Family  Time Spent with Patient 83

## 2020-08-17 ENCOUNTER — Other Ambulatory Visit: Payer: Self-pay | Admitting: Hematology & Oncology

## 2020-08-17 DIAGNOSIS — C7951 Secondary malignant neoplasm of bone: Secondary | ICD-10-CM

## 2020-08-19 ENCOUNTER — Encounter: Payer: Self-pay | Admitting: *Deleted

## 2020-08-19 NOTE — Progress Notes (Signed)
Dr Marin Olp spoke with patient and would like to have another PET scan done. She just had a PET scan in February. Request has been submitted to her insurance. It's currently in pending status. Will schedule once approval is obtained.   Oncology Nurse Navigator Documentation  Oncology Nurse Navigator Flowsheets 08/19/2020  Abnormal Finding Date -  Diagnosis Status -  Navigator Follow Up Date: 08/22/2020  Navigator Follow Up Reason: Other:  Navigator Location [facility]  Referral Date to RadOnc/MedOnc -  Navigator Encounter Type Appt/Treatment Plan Review  Telephone -  Patient Visit Type MedOnc  Treatment Phase Abnormal Scans  Barriers/Navigation Needs Anxiety;Coordination of Care;Education;Personal Conflicts  Education -  Interventions None Required  Acuity Level 3-Moderate Needs (3-4 Barriers Identified)  Coordination of Care -  Education Method -  Support Groups/Services Friends and Family  Time Spent with Patient 30

## 2020-08-24 ENCOUNTER — Encounter: Payer: Self-pay | Admitting: *Deleted

## 2020-08-24 NOTE — Progress Notes (Signed)
PET scan approved through patient insurance. Called and schedule PET for 09/06/2020. Called patient to notify her of appointment. This may be difficult for her because she is unable to drop her daughter off at daycare by 6:30am. I gave her the number to central scheduling to call if she needs to reschedule. PET scan prep instructions reviewed with patient.   Oncology Nurse Navigator Documentation  Oncology Nurse Navigator Flowsheets 08/24/2020  Abnormal Finding Date -  Diagnosis Status -  Navigator Follow Up Date: 09/06/2020  Navigator Follow Up Reason: Scan Review  Navigator Location [facility]  Referral Date to RadOnc/MedOnc -  Navigator Encounter Type Telephone  Telephone Appt Confirmation/Clarification;Outgoing Call  Patient Visit Type MedOnc  Treatment Phase Abnormal Scans  Barriers/Navigation Needs Anxiety;Coordination of Care;Education;Personal Conflicts  Education Other  Interventions Coordination of Care;Education;Psycho-Social Support  Acuity Level 3-Moderate Needs (3-4 Barriers Identified)  Coordination of Care Radiology  Education Method Verbal  Support Groups/Services Friends and Family  Time Spent with Patient 60

## 2020-08-25 ENCOUNTER — Ambulatory Visit: Payer: Medicaid Other | Admitting: Family Medicine

## 2020-09-06 ENCOUNTER — Encounter (HOSPITAL_BASED_OUTPATIENT_CLINIC_OR_DEPARTMENT_OTHER): Payer: Medicaid Other | Admitting: Cardiovascular Disease

## 2020-09-06 ENCOUNTER — Ambulatory Visit (HOSPITAL_COMMUNITY): Admission: RE | Admit: 2020-09-06 | Payer: Medicaid Other | Source: Ambulatory Visit

## 2020-09-07 ENCOUNTER — Ambulatory Visit (HOSPITAL_BASED_OUTPATIENT_CLINIC_OR_DEPARTMENT_OTHER): Payer: Medicaid Other | Attending: Family Medicine

## 2020-09-13 ENCOUNTER — Encounter: Payer: Self-pay | Admitting: *Deleted

## 2020-09-15 ENCOUNTER — Institutional Professional Consult (permissible substitution): Payer: Medicaid Other | Admitting: Pulmonary Disease

## 2020-09-15 ENCOUNTER — Encounter: Payer: Self-pay | Admitting: *Deleted

## 2020-09-15 NOTE — Progress Notes (Signed)
Patient had to cancel her previously scheduled PET scan due to having a sick child. She has not yet rescheduled. MyChart message sent earlier this week which has still not been read. Called and left a message on patient's voice mail requesting call back so that I can help to reschedule.   Oncology Nurse Navigator Documentation  Oncology Nurse Navigator Flowsheets 09/15/2020  Abnormal Finding Date -  Diagnosis Status -  Navigator Follow Up Date: -  Navigator Follow Up Reason: -  Navigator Location [facility]  Referral Date to RadOnc/MedOnc -  Navigator Encounter Type Telephone  Telephone -  Patient Visit Type MedOnc  Treatment Phase Abnormal Scans  Barriers/Navigation Needs Anxiety;Coordination of Care;Education;Personal Conflicts  Education Other  Interventions Coordination of Care;Education  Acuity Level 2-Minimal Needs (1-2 Barriers Identified)  Coordination of Care Radiology  Education Method Verbal  Support Groups/Services Friends and Family  Time Spent with Patient 15

## 2020-09-16 NOTE — Progress Notes (Deleted)
-  NEUROLOGY CONSULTATION NOTE  Jodi Norman MRN: [id number] DOB: 15-Sep-1995  Referring provider: Roma Schanz, DO Primary care provider: Roma Schanz, DO  Reason for consult:  migraines  Assessment/Plan:   ***   Subjective:  Jodi Norman is a 25 year old ***-handed female who presents for migraines.  History supplemented by referring provider's notes.  Patient reports not feeling well for ***.  She has significant anxiety, fatigue, headaches, palpitations, dyspnea and occasional chest pain.  She was evaluated in the ED on 06/22/2020 where CTA of chest ruled out PE but showed sclerotic lesions in the spine at T4 and T9 with some changes at T7 and L1 concerning for metastatic disease.  She followed up with oncology and underwent bone biopsy which did not show cancer but findings suggestive of sarcoidosis.  ACE was normal at 39.  ANA was positive but RF negative.  She is being referred to pulmonology.  She followed up with cardiology.  Echocardiogram ***.  14 day zio patch ***.  Sleep study ***.  TSH is normal.  Vitamin D was low and she was started on supplementation.  Headaches started ***.  PCP started her on amitriptyline.  She had an MRI of the brain with and without contrast on 06/25/2020 personally reviewed showed nonspecific non-enhancing mild white matter changes in the cerebral hemisphere but no evidence of metastasis or other acute abnormality.  Current NSAIDS/analgesics:  *** Current triptans:  *** Current ergotamine:  *** Current anti-emetic:  *** Current muscle relaxants:  *** Current Antihypertensive medications:  *** Current Antidepressant medications:  Amitriptyline 10mg  QHS, Lexapro 10mg  QD Current Anticonvulsant medications:  *** Current anti-CGRP:  *** Current Vitamins/Herbal/Supplements:  D Current Antihistamines/Decongestants:  *** Other therapy:  *** Hormone/birth control:  *** Other medications:  hydroxyzine  Past NSAIDS/analgesics:  *** Past  abortive triptans:  *** Past abortive ergotamine:  *** Past muscle relaxants:  *** Past anti-emetic:  *** Past antihypertensive medications:  *** Past antidepressant medications:  *** Past anticonvulsant medications:  *** Past anti-CGRP:  *** Past vitamins/Herbal/Supplements:  *** Past antihistamines/decongestants:  *** Other past therapies:  ***  Caffeine:  *** Alcohol:  *** Smoker:  *** Diet:  *** Exercise:  *** Depression:  ***; Anxiety:  *** Other pain:  *** Sleep hygiene:  *** Family history of headache:  ***      PAST MEDICAL HISTORY: Past Medical History:  Diagnosis Date  . Generalized anxiety disorder 06/24/2020    PAST SURGICAL HISTORY: No past surgical history on file.  MEDICATIONS: Current Outpatient Medications on File Prior to Visit  Medication Sig Dispense Refill  . amitriptyline (ELAVIL) 10 MG tablet TAKE 1 TABLET BY MOUTH AT BEDTIME AS NEEDED FOR SLEEP. 90 tablet 1  . clotrimazole-betamethasone (LOTRISONE) cream clotrimazole-betamethasone 1 %-0.05 % topical cream  APP EXT TO THE AFFECTED AND SURROUNDING AREAS BID IN THE MORNING AND IN THE EVE FOR 2 WKS    . doxycycline (VIBRA-TABS) 100 MG tablet Take 1 tablet (100 mg total) by mouth 2 (two) times daily. 20 tablet 0  . escitalopram (LEXAPRO) 10 MG tablet Take 10 mg by mouth daily.    . fluconazole (DIFLUCAN) 150 MG tablet Take 150 mg by mouth daily.    . hydrOXYzine (ATARAX/VISTARIL) 25 MG tablet Take 1 tablet (25 mg total) by mouth every 6 (six) hours as needed. 20 tablet 0  . metroNIDAZOLE (FLAGYL) 500 MG tablet Take 500 mg by mouth.    . Vitamin D, Ergocalciferol, (DRISDOL) 1.25 MG (50000 UNIT)  CAPS capsule Take 1 capsule (50,000 Units total) by mouth every 7 (seven) days. 12 capsule 0   No current facility-administered medications on file prior to visit.    ALLERGIES: No Known Allergies  FAMILY HISTORY: Family History  Problem Relation Age of Onset  . Hyperlipidemia Mother   . Hypertension  Mother   . Hyperlipidemia Father   . Hypertension Father   . Diabetes Father     Objective:  *** General: No acute distress.  Patient appears well-groomed.   Head:  Normocephalic/atraumatic Eyes:  fundi examined but not visualized Neck: supple, no paraspinal tenderness, full range of motion Back: No paraspinal tenderness Heart: regular rate and rhythm Lungs: Clear to auscultation bilaterally. Vascular: No carotid bruits. Neurological Exam: Mental status: alert and oriented to person, place, and time, recent and remote memory intact, fund of knowledge intact, attention and concentration intact, speech fluent and not dysarthric, language intact. Cranial nerves: CN I: not tested CN II: pupils equal, round and reactive to light, visual fields intact CN III, IV, VI:  full range of motion, no nystagmus, no ptosis CN V: facial sensation intact. CN VII: upper and lower face symmetric CN VIII: hearing intact CN IX, X: gag intact, uvula midline CN XI: sternocleidomastoid and trapezius muscles intact CN XII: tongue midline Bulk & Tone: normal, no fasciculations. Motor:  muscle strength 5/5 throughout Sensation:  Pinprick, temperature and vibratory sensation intact. Deep Tendon Reflexes:  2+ throughout,  toes downgoing.   Finger to nose testing:  Without dysmetria.   Heel to shin:  Without dysmetria.   Gait:  Normal station and stride.  Romberg negative.    Thank you for allowing me to take part in the care of this patient.  Metta Clines, DO  CC: ***

## 2020-09-19 ENCOUNTER — Telehealth: Payer: Medicaid Other | Admitting: Neurology

## 2020-09-19 ENCOUNTER — Other Ambulatory Visit: Payer: Self-pay

## 2020-09-27 ENCOUNTER — Encounter: Payer: Self-pay | Admitting: *Deleted

## 2020-09-27 NOTE — Progress Notes (Signed)
Able to reach patient to discuss rescheduling of PET scan. Patient is available for 8am PET with a 7:30a arrival time.  Scheduled PET for 10/06/2020 at 8am. Patient is aware of date, time and PET preparation.   Oncology Nurse Navigator Documentation  Oncology Nurse Navigator Flowsheets 09/27/2020  Abnormal Finding Date -  Diagnosis Status -  Navigator Follow Up Date: 10/06/2020  Navigator Follow Up Reason: Scan Review  Navigator Location [facility]  Referral Date to RadOnc/MedOnc -  Navigator Encounter Type Telephone  Telephone Asess Navigation Needs;Outgoing Call  Patient Visit Type MedOnc  Treatment Phase Abnormal Scans  Barriers/Navigation Needs Anxiety;Coordination of Care;Education;Personal Conflicts  Education Other  Interventions Coordination of Care;Education;Psycho-Social Support  Acuity Level 2-Minimal Needs (1-2 Barriers Identified)  Coordination of Care Radiology  Education Method Verbal  Support Groups/Services Friends and Family  Time Spent with Patient 25

## 2020-10-06 ENCOUNTER — Telehealth: Payer: Self-pay

## 2020-10-06 ENCOUNTER — Other Ambulatory Visit: Payer: Self-pay

## 2020-10-06 ENCOUNTER — Encounter (HOSPITAL_COMMUNITY)
Admission: RE | Admit: 2020-10-06 | Discharge: 2020-10-06 | Disposition: A | Discharge status: Home / Self Care | Payer: Medicaid Other | Source: Ambulatory Visit | Attending: Hematology & Oncology | Admitting: Hematology & Oncology

## 2020-10-06 ENCOUNTER — Encounter: Payer: Self-pay | Admitting: *Deleted

## 2020-10-06 DIAGNOSIS — C7951 Secondary malignant neoplasm of bone: Secondary | ICD-10-CM | POA: Diagnosis present

## 2020-10-06 DIAGNOSIS — C801 Malignant (primary) neoplasm, unspecified: Secondary | ICD-10-CM | POA: Diagnosis present

## 2020-10-06 LAB — GLUCOSE, CAPILLARY: Glucose-Capillary: 95 mg/dL (ref 70–99)

## 2020-10-06 IMAGING — CT NM PET TUM IMG RESTAG (PS) SKULL BASE T - THIGH
1 of 8 series · 2 of 25 positions shown · non-contrast
Comparison: PET-CT scan [DATE]

CLINICAL DATA: Subsequent treatment strategy for cancer of unknown
primary. Osseous metastasis.

EXAM:
NUCLEAR MEDICINE PET SKULL BASE TO THIGH
TECHNIQUE: 10.8 mCi F-18 FDG was injected intravenously. Full-ring PET imaging
was performed from the skull base to thigh after the radiotracer. CT
data was obtained and used for attenuation correction and anatomic
localization.
Fasting blood glucose: 95 mg/dl

[Series 4: ct sk_thigh 5.0 bf37 · axial · 5.0mm · 0.98mm/px · z∈[-1201,-989]mm · 2 of 213 slices shown]
[im 54/213  brain]
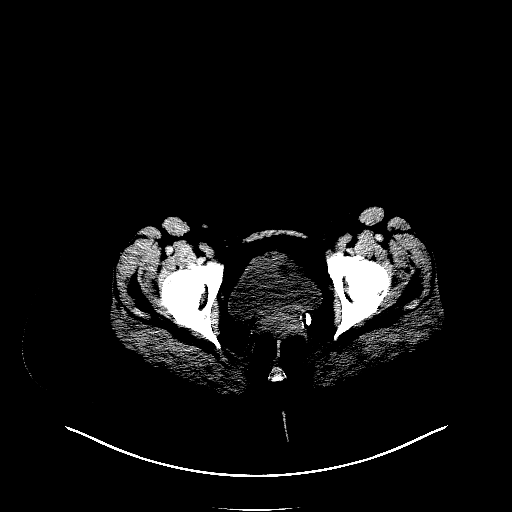
[im 107/213  brain]
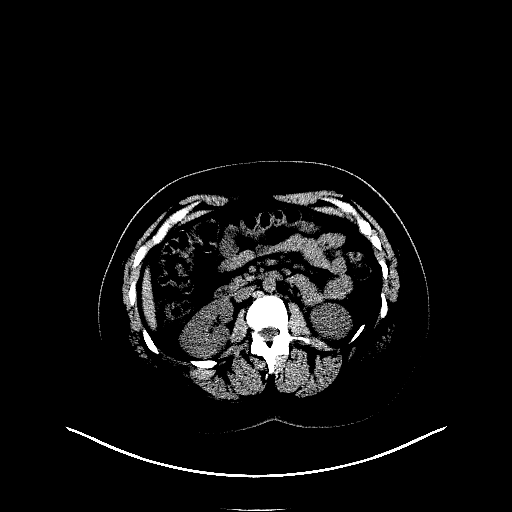

[2 of 25 positions shown; findings below may reference images not displayed]

FINDINGS: Mediastinal blood pool activity: SUV max

Liver activity: SUV max

NECK: Interval resolution of the mildly hypermetabolic level II
cervical lymph nodes seen on comparison exam. There is persistent
intense hypermetabolic activity in the lingual and palatine tonsils
with SUV max equal 13.4 compared to SUV max equal 21.2.

There is a new hypermetabolic cutaneous lesion in the LEFT cheek
with SUV max equal 5.1 (image 23). There is misregistration due to
head movement with lesion measuring 5 mm on CT image 21.

Mild activity is similar to smaller lesion in the RIGHT cheek with
SUV max equal 5.7 on image 20.

Incidental CT findings: none

CHEST: No hypermetabolic mediastinal or hilar nodes. No suspicious
pulmonary nodules on the CT scan.

Incidental CT findings: none

ABDOMEN/PELVIS: No abnormal hypermetabolic activity within the
liver, pancreas, adrenal glands, or spleen. No hypermetabolic lymph
nodes in the abdomen or pelvis.

There is scattered metabolic activity in small bowel. One 5 cm
segment small bowel does have relatively greater intensity with SUV
max equal 8.3 on image 115. No lesion on CT portion exam. No
significant focal bowel activity on comparison PET-CT.

Incidental CT findings: none

SKELETON: Multifocal sclerotic bone lesions with low radiotracer
activity. For example broad sclerotic lesion involving the RIGHT
sacral ala with low background metabolic activity (SUV max equal
2.9).

There mild metabolic activity in the posterior RIGHT iliac bone
(without sclerosis) with SUV max equal 3.6 decreased from SUV max
equal 4.1 on comparison exam. This mild metabolic activity may
relate to normal anemic marrow response.

Elsewhere there are scattered sclerotic lesions in the spine and
sternum which have low metabolic activity less than the adjacent non
sclerotic bones in the spine.

Incidental CT findings: No pathologic fracture
IMPRESSION: 1. No change in multifocal sclerotic skeletal lesions. The sclerotic
lesions have low metabolic activity less than adjacent non sclerotic
bones.
2. Relatively low metabolic activity activity within normal RIGHT
iliac bone may relate to anemic marrow response. Similar findings
elsewhere in spine.
3. Interval resolution of the hypermetabolic adenopathy in the
cervical nodes. Persistent intense metabolic activity within the
hypopharynx at the level the lingual and palatine tonsils.
4. Two new cutaneous lesions in the LEFT and RIGHT cheek with
hypermetabolic activity of unclear significance.
5. Focal activity loop of small bowel without CT lesion. Nonspecific
finding.

## 2020-10-06 MED ORDER — FLUDEOXYGLUCOSE F - 18 (FDG) INJECTION
10.7900 | Freq: Once | INTRAVENOUS | Status: AC
  Administered 2020-10-06: 10.79 via INTRAVENOUS

## 2020-10-06 NOTE — Telephone Encounter (Signed)
Called pt with appts per sch message, pt advises that she can only do early am appts, pt given next avail   Jodi Norman

## 2020-10-06 NOTE — Progress Notes (Signed)
Reviewed PET results with Dr Marin Olp. PET is essentially stable from last scan. He would like to follow up with patient in 2-3 weeks.   Message left on patient's voicemail. Message sent to scheduling.  Oncology Nurse Navigator Documentation  Oncology Nurse Navigator Flowsheets 10/06/2020  Abnormal Finding Date -  Diagnosis Status -  Navigator Follow Up Date: -  Navigator Follow Up Reason: -  Navigator Location [facility]  Referral Date to RadOnc/MedOnc -  Navigator Encounter Type Scan Review  Telephone -  Patient Visit Type MedOnc  Treatment Phase Abnormal Scans  Barriers/Navigation Needs Anxiety;Coordination of Care;Education;Personal Conflicts  Education Other  Interventions Coordination of Care;Education;Psycho-Social Support  Acuity Level 2-Minimal Needs (1-2 Barriers Identified)  Coordination of Care Other  Education Method Verbal  Support Groups/Services Friends and Family  Time Spent with Patient 65

## 2020-10-27 ENCOUNTER — Other Ambulatory Visit: Payer: Self-pay

## 2020-10-27 ENCOUNTER — Ambulatory Visit (INDEPENDENT_AMBULATORY_CARE_PROVIDER_SITE_OTHER): Payer: Medicaid Other | Admitting: Pulmonary Disease

## 2020-10-27 ENCOUNTER — Encounter: Payer: Self-pay | Admitting: Pulmonary Disease

## 2020-10-27 VITALS — BP 122/80 | HR 76 | Temp 98.2°F | Ht 67.0 in | Wt 220.0 lb

## 2020-10-27 DIAGNOSIS — D869 Sarcoidosis, unspecified: Secondary | ICD-10-CM | POA: Diagnosis not present

## 2020-10-27 DIAGNOSIS — M899 Disorder of bone, unspecified: Secondary | ICD-10-CM | POA: Insufficient documentation

## 2020-10-27 NOTE — Progress Notes (Signed)
Subjective:    Patient ID: Jodi Norman, female    DOB: 1996/02/01, 25 y.o.   MRN: [id number]  HPI  25 year old never smoker presents for evaluation of bone lesions with suspicion of sarcoid. She presented in January with 2 weeks of sweats, loss of appetite, shortness of breath with sore throat.  No fevers, no loss of taste and smell.  She tested positive for COVID.  Chest x-ray was normal CT angiogram was performed which showed sclerotic bone lesions in vertebra, pelvis, clavicular heads and ribs.  PET scan confirmed hypermetabolism in these areas and subcutaneous nodules and in the pharynx and tonsils likely related to COVID.  She underwent bone marrow biopsy of her pelvic lesion which did not show any evidence of cancer but showed a rare noncaseating granuloma.   Review of CBC shows mild lymphocytosis January and then a normal profile with normocytic anemia ACE level normal ANA 180 homogeneous pattern  She reports occasional stiff joints, no skin rash. She has subcutaneous nodules in her anterior abdominal wall and sometimes in her inner thigh and buttocks.  She reports floaters in her eyes but denies redness itchiness or change in vision  Significant tests/ events reviewed  CTA chest 05/2020 sclerotic changes involving the body of the sternum as well as sclerotic changes of T4 and T9 vertebra. Additional smaller sclerotic changes involving T7 and L1, clavicular heads and ribs.  Mildly enlarged lymph node in the lower neck to the right of the trachea  Pelvic bone biopsy 06/2020 rare noncaseating granuloma  PET 06/2020 diffuse sclerotic disease including pelvis, hypermetabolic.  Hypermetabolic, pharynx and tonsils, mildly hypermetabolic subcu nodules  PET scan 09/2020 no change in bone lesions , neck lymphadenopathy is resolved, hypermetabolic lesions in left and right cheek   Past Medical History:  Diagnosis Date  . Generalized anxiety disorder 06/24/2020   History reviewed. No  pertinent surgical history.  No Known Allergies  Social History   Socioeconomic History  . Marital status: Single    Spouse name: Not on file  . Number of children: Not on file  . Years of education: Not on file  . Highest education level: Not on file  Occupational History  . Not on file  Tobacco Use  . Smoking status: Never Smoker  . Smokeless tobacco: Never Used  Substance and Sexual Activity  . Alcohol use: Never  . Drug use: Never  . Sexual activity: Not Currently    Birth control/protection: None  Other Topics Concern  . Not on file  Social History Narrative  . Not on file   Social Determinants of Health   Financial Resource Strain: Not on file  Food Insecurity: Not on file  Transportation Needs: Not on file  Physical Activity: Not on file  Stress: Not on file  Social Connections: Not on file  Intimate Partner Violence: Not on file    Family History  Problem Relation Age of Onset  . Hyperlipidemia Mother   . Hypertension Mother   . Hyperlipidemia Father   . Hypertension Father   . Diabetes Father         Review of Systems Shortness of breath with activity Tooth problems Headaches Anxiety, joint stiffness   Constitutional: negative for anorexia, fevers and sweats  Eyes: negative for irritation, redness and visual disturbance  Ears, nose, mouth, throat, and face: negative for earaches, epistaxis, nasal congestion   Respiratory: negative for cough, , sputum and wheezing  Cardiovascular: negative for chest pain,  lower extremity edema, orthopnea,  palpitations and syncope  Gastrointestinal: negative for abdominal pain, constipation, diarrhea, melena, nausea and vomiting  Genitourinary:negative for dysuria, frequency and hematuria  Hematologic/lymphatic: negative for bleeding, easy bruising and lymphadenopathy  Musculoskeletal:negative for arthralgias, muscle weakness and stiff joints  Neurological: negative for coordination problems, gait problems,  headaches and weakness  Endocrine: negative for diabetic symptoms including polydipsia, polyuria and weight loss     Objective:   Physical Exam  Gen. Pleasant, obese, in no distress, normal affect ENT - no pallor,icterus, no post nasal drip, class 2 airway, facial acne Neck: No JVD, no thyromegaly, no carotid bruits Lungs: no use of accessory muscles, no dullness to percussion, decreased without rales or rhonchi  Cardiovascular: Rhythm regular, heart sounds  normal, no murmurs or gallops, no peripheral edema Abdomen: soft and non-tender, no hepatosplenomegaly, BS normal. Musculoskeletal: No deformities, no cyanosis or clubbing Neuro:  alert, non focal, no tremors  Skin-acne, lesion over anterior abdominal wall near C-section scar appears to be folliculitis       Assessment & Plan:    Assessment:   . 1 or more chronic illnesses with severe exacerbation, progression, or side effects of treatment;  . 1 acute or chronic illness or injury that poses a threat to life or bodily function  Plan Following Extensive Data Review & Interpretation:  . I reviewed prior external note(s) from oncology, PCP . I reviewed the result(s) of CT, PET scans, blood work . I have ordered dermatology and ophthalmology referral  Independent interpretation of tests . Review of patient's CT/PET images . The patient's images have been independently reviewed by me.    Discussion of management or test interpretation with another colleague PCP/oncology.

## 2020-10-27 NOTE — Assessment & Plan Note (Signed)
Her presentation with predominant sclerotic bone lesions is not typical for sarcoidosis.  Rare noncaseating granuloma was noted on bone biopsy.  There is no pulmonary or lymph node or liver/spleen involvement.  Cutaneous  lesions seem to come and go.  I would recommend dermatology referral for biopsy of a skin lesion if present and evidence of granulomas would be more convincing for sarcoidosis.  I also recommended I appointment with sleep lab evaluation to look for sarcoid lesions. ACE level is normal.  She has slightly positive ANA and this can be explored further, she has nonspecific joint symptoms no clear skin rash to indicate lupus.  I am not sure how to tie an autoimmune disease of any kind to bone lesions. I would defer to oncology about biopsy of another bone lesion and we will see her back in 6 weeks

## 2020-10-27 NOTE — Patient Instructions (Signed)
Eye appt for slit lamp examination for sarcoid Dermatology appt for biopsy of skin lesions

## 2020-11-01 ENCOUNTER — Encounter: Payer: Self-pay | Admitting: *Deleted

## 2020-11-04 ENCOUNTER — Other Ambulatory Visit: Payer: Self-pay | Admitting: *Deleted

## 2020-11-04 DIAGNOSIS — C7951 Secondary malignant neoplasm of bone: Secondary | ICD-10-CM

## 2020-11-07 ENCOUNTER — Inpatient Hospital Stay: Payer: Medicaid Other | Admitting: Hematology & Oncology

## 2020-11-07 ENCOUNTER — Inpatient Hospital Stay: Payer: Medicaid Other | Attending: Hematology & Oncology

## 2020-11-08 ENCOUNTER — Encounter: Payer: Self-pay | Admitting: *Deleted

## 2020-11-11 ENCOUNTER — Encounter: Payer: Self-pay | Admitting: *Deleted

## 2020-11-11 NOTE — Progress Notes (Signed)
Patient was a no-show to her follow up appointment this week. Message sent to scheduling and patient requested a telemedicine appointment. Dr Marin Olp agreed. Patient scheduled.  Oncology Nurse Navigator Documentation  Oncology Nurse Navigator Flowsheets 11/11/2020  Abnormal Finding Date -  Diagnosis Status -  Navigator Follow Up Date: 11/15/2020  Navigator Follow Up Reason: Follow-up Appointment  Navigator Location [facility]  Referral Date to RadOnc/MedOnc -  Navigator Encounter Type Appt/Treatment Plan Review  Telephone -  Patient Visit Type MedOnc  Treatment Phase Abnormal Scans  Barriers/Navigation Needs Anxiety;Coordination of Care;Education;Personal Conflicts  Education -  Interventions Coordination of Care;Education;Psycho-Social Support  Acuity Level 2-Minimal Needs (1-2 Barriers Identified)  Coordination of Care Appts  Education Method -  Support Groups/Services Friends and Family  Time Spent with Patient 15

## 2020-11-15 ENCOUNTER — Telehealth: Payer: Self-pay

## 2020-11-15 ENCOUNTER — Inpatient Hospital Stay (HOSPITAL_BASED_OUTPATIENT_CLINIC_OR_DEPARTMENT_OTHER): Payer: Medicaid Other | Admitting: Hematology & Oncology

## 2020-11-15 ENCOUNTER — Encounter: Payer: Self-pay | Admitting: *Deleted

## 2020-11-15 ENCOUNTER — Other Ambulatory Visit: Payer: Self-pay | Admitting: Family Medicine

## 2020-11-15 ENCOUNTER — Other Ambulatory Visit: Payer: Self-pay

## 2020-11-15 DIAGNOSIS — M899 Disorder of bone, unspecified: Secondary | ICD-10-CM | POA: Diagnosis not present

## 2020-11-15 DIAGNOSIS — F419 Anxiety disorder, unspecified: Secondary | ICD-10-CM

## 2020-11-15 NOTE — Telephone Encounter (Signed)
Appts made per 11/15/20 los and pt to gain sch through [employer]

## 2020-11-15 NOTE — Progress Notes (Signed)
Oncology Nurse Navigator Documentation  Oncology Nurse Navigator Flowsheets 11/15/2020  Abnormal Finding Date -  Diagnosis Status -  Navigator Follow Up Date: -  Navigator Follow Up Reason: -  Navigation Complete Date: 11/15/2020  Post Navigation: Continue to Follow Patient? No  Reason Not Navigating Patient: No Cancer Diagnosis  Navigator Location [facility]  Referral Date to RadOnc/MedOnc -  Navigator Encounter Type Appt/Treatment Plan Review  Telephone -  Patient Visit Type MedOnc  Treatment Phase Abnormal Scans  Barriers/Navigation Needs Anxiety;Coordination of Care;Education;Personal Conflicts  Education -  Interventions None Required  Acuity Level 2-Minimal Needs (1-2 Barriers Identified)  Coordination of Care -  Education Method -  Support Groups/Services Friends and Family  Time Spent with Patient 15

## 2020-11-15 NOTE — Progress Notes (Signed)
Hematology and Oncology Follow Up Visit  Jodi Norman [id number] 1995/06/15 24 y.o. 11/15/2020   Principle Diagnosis:  Abnormal bone scan with PET positive lesions  Current Therapy:   Observation     Interim History:  Jodi Norman is seen as part of a virtual visit.  We cannot have her come into the office because Jodi Norman is working.  Jodi Norman works at home.  Work is incredibly busy for her.  We been seeing her quite a bit because of these PET scans that Jodi Norman has.  Jodi Norman had positive PET scan lesions on her bones.  We actually did do a biopsy of a bone lesion.  This was back in February.  The biopsy report ([id number]) showed a normocellular marrow.  There was no mention of any type of malignancy..  Jodi Norman had a recent PET scan done.  This showed absolutely no change with respect to the bone lesions.  There is very little activity in these bone lesions.  There is nothing new.  Jodi Norman feels well.  Jodi Norman is always felt well.  Jodi Norman is working.  Jodi Norman is having no bony pain.  Jodi Norman is having no problems with cough or shortness of breath.  Jodi Norman has had no problems with COVID.  Overall, I would have to say that her performance status is probably ECOG 0.  Medications:  Current Outpatient Medications:    amitriptyline (ELAVIL) 10 MG tablet, TAKE 1 TABLET BY MOUTH AT BEDTIME AS NEEDED FOR SLEEP. (Patient not taking: Reported on 10/27/2020), Disp: 90 tablet, Rfl: 1   escitalopram (LEXAPRO) 10 MG tablet, Take 10 mg by mouth daily. (Patient not taking: Reported on 10/27/2020), Disp: , Rfl:    hydrOXYzine (ATARAX/VISTARIL) 25 MG tablet, Take 1 tablet (25 mg total) by mouth every 6 (six) hours as needed., Disp: 20 tablet, Rfl: 0   Vitamin D, Ergocalciferol, (DRISDOL) 1.25 MG (50000 UNIT) CAPS capsule, Take 1 capsule (50,000 Units total) by mouth every 7 (seven) days., Disp: 12 capsule, Rfl: 0  Allergies: No Known Allergies  Past Medical History, Surgical history, Social history, and Family History were reviewed and  updated.  Review of Systems: Review of Systems  Constitutional:  Negative for appetite change, fatigue, fever and unexpected weight change.  HENT:   Negative for lump/mass, mouth sores, sore throat and trouble swallowing.   Respiratory:  Negative for cough, hemoptysis and shortness of breath.   Cardiovascular:  Negative for leg swelling and palpitations.  Gastrointestinal:  Negative for abdominal distention, abdominal pain, blood in stool, constipation, diarrhea, nausea and vomiting.  Genitourinary:  Negative for bladder incontinence, dysuria, frequency and hematuria.   Musculoskeletal:  Negative for arthralgias, back pain, gait problem and myalgias.  Skin:  Negative for itching and rash.  Neurological:  Negative for dizziness, extremity weakness, gait problem, headaches, numbness, seizures and speech difficulty.  Hematological:  Does not bruise/bleed easily.  Psychiatric/Behavioral:  Negative for depression and sleep disturbance. The patient is not nervous/anxious.    Physical Exam:  vitals were not taken for this visit.   Wt Readings from Last 3 Encounters:  10/27/20 220 lb (99.8 kg)  08/03/20 220 lb (99.8 kg)  07/28/20 213 lb 6.4 oz (96.8 kg)    Physical Exam Vitals reviewed.  HENT:     Head: Normocephalic and atraumatic.  Eyes:     Pupils: Pupils are equal, round, and reactive to light.  Cardiovascular:     Rate and Rhythm: Normal rate and regular rhythm.     Heart sounds: Normal heart sounds.  Pulmonary:     Effort: Pulmonary effort is normal.     Breath sounds: Normal breath sounds.  Abdominal:     General: Bowel sounds are normal.     Palpations: Abdomen is soft.  Musculoskeletal:        General: No tenderness or deformity. Normal range of motion.     Cervical back: Normal range of motion.  Lymphadenopathy:     Cervical: No cervical adenopathy.  Skin:    General: Skin is warm and dry.     Findings: No erythema or rash.  Neurological:     Mental Status: Jodi Norman is  alert and oriented to person, place, and time.  Psychiatric:        Behavior: Behavior normal.        Thought Content: Thought content normal.        Judgment: Judgment normal.     Lab Results  Component Value Date   WBC 5.6 08/03/2020   HGB 10.6 (L) 08/03/2020   HCT 31.2 (L) 08/03/2020   MCV 81.9 08/03/2020   PLT 243 08/03/2020     Chemistry      Component Value Date/Time   NA 138 08/03/2020 0916   K 3.6 08/03/2020 0916   CL 107 08/03/2020 0916   CO2 25 08/03/2020 0916   BUN <5 (L) 08/03/2020 0916   CREATININE 0.65 08/03/2020 0916      Component Value Date/Time   CALCIUM 9.2 08/03/2020 0916   ALKPHOS 95 08/03/2020 0916   AST 13 (L) 08/03/2020 0916   ALT 9 08/03/2020 0916   BILITOT 0.2 (L) 08/03/2020 0916      Impression and Plan: Jodi Norman is a very nice 25 year old African-American female.  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  Again nothing is changed on her PET scan.  Jodi Norman is asymptomatic.  I just cannot imagine that this would be malignant.  I would have to believe that Jodi Norman had malignancy, we was to be seeing clear progression in the PET scan.  I still think we have to follow-up with another PET scan.  I think we can do this in 6 months.  I would like to have another meeting with her in about 4 months.  I know that Jodi Norman will be busy this summer.  It sounds like her quality of life is still doing quite well.  Jodi Norman has a 20-year-old daughter.  I think they are planning on going to [employer] in November.   Volanda Napoleon, MD 6/21/202212:55 PM

## 2020-11-16 ENCOUNTER — Other Ambulatory Visit: Payer: Self-pay

## 2020-11-16 ENCOUNTER — Encounter: Payer: Self-pay | Admitting: Family Medicine

## 2020-11-16 ENCOUNTER — Other Ambulatory Visit: Payer: Self-pay | Admitting: Family Medicine

## 2020-11-16 ENCOUNTER — Telehealth: Payer: Self-pay | Admitting: *Deleted

## 2020-11-16 DIAGNOSIS — F419 Anxiety disorder, unspecified: Secondary | ICD-10-CM

## 2020-11-16 MED ORDER — ESCITALOPRAM OXALATE 10 MG PO TABS: 10.0000 mg | ORAL_TABLET | Freq: Every day | ORAL | 2 refills | Status: DC

## 2020-11-16 MED ORDER — CLONAZEPAM 0.25 MG PO TBDP: 0.2500 mg | ORAL_TABLET | Freq: Every day | ORAL | 0 refills | Status: DC

## 2020-11-16 NOTE — Addendum Note (Signed)
Addended by: Sanda Linger on: 11/16/2020 02:41 PM   Modules accepted: Orders

## 2020-11-16 NOTE — Telephone Encounter (Signed)
*  See after hours note*  Requesting: Klonopin Contract: N/A UDS: N/A Last OV: 07/28/20 Next OV: N/A Last Refill: 06/24/2020, #30--0 RF Database:   Please advise

## 2020-11-16 NOTE — Telephone Encounter (Signed)
Contact Type Call Who Is Calling Patient / Member / Family / Caregiver Call Type Triage / Clinical Relationship To Patient Self Return Phone Number [phone] (Primary) Chief Complaint Anxiety and Panic Attack Reason for Call Symptomatic / Request for Health Information Initial Comment Caller states she is having really bad anxiety and she needs refill. She needs a prescriber response to have it filled. Translation No Nurse Assessment Nurse: Doren Custard, RN, Marcene Brawn Date/Time (Eastern Time): 11/15/2020 7:10:53 PM Confirm and document reason for call. If symptomatic, describe symptoms. ---Caller states she is having really bad anxiety and she needs refill on her Lorazepam or Clonazepam. Reports having an anxiety attack right now.   Comments User: Earnie Larsson, RN Date/Time Eilene Ghazi Time): 11/15/2020 7:20:32 PM When providing dispo, caller states she felt like her anxiety was getting worse knowing she cannot get her medication. Nurse unable to provide home care advice as caller was speaking constantly and voiced frustration that the office had not gotten back with her today(she called earlier in the day about her refill). User: Earnie Larsson, RN Date/Time Eilene Ghazi Time): 11/15/2020 7:22:34 PM Instructed patient that refills on controlled substances are not managed after hours. Instructed pt to be seen in UC if she needs the medication tonight. She verbalized agreement

## 2020-11-16 NOTE — Telephone Encounter (Signed)
Spoke with patient. Pt states she has restarted the Lexapro and would like a refill. Okay to refill?

## 2020-11-16 NOTE — Telephone Encounter (Signed)
Refill sent.

## 2020-11-17 ENCOUNTER — Other Ambulatory Visit: Payer: Self-pay | Admitting: Family Medicine

## 2020-11-17 DIAGNOSIS — F419 Anxiety disorder, unspecified: Secondary | ICD-10-CM

## 2020-11-17 MED ORDER — VITAMIN D (ERGOCALCIFEROL) 1.25 MG (50000 UNIT) PO CAPS: 50000.0000 [IU] | ORAL_CAPSULE | ORAL | 0 refills | Status: DC

## 2020-11-17 MED ORDER — CLONAZEPAM 0.5 MG PO TABS: 0.5000 mg | ORAL_TABLET | Freq: Two times a day (BID) | ORAL | 0 refills | Status: DC | PRN

## 2020-12-08 ENCOUNTER — Other Ambulatory Visit: Payer: Self-pay | Admitting: Family Medicine

## 2020-12-12 ENCOUNTER — Ambulatory Visit: Payer: Medicaid Other | Admitting: Pulmonary Disease

## 2021-01-06 ENCOUNTER — Other Ambulatory Visit: Payer: Self-pay

## 2021-01-06 ENCOUNTER — Ambulatory Visit
Admission: EM | Admit: 2021-01-06 | Discharge: 2021-01-06 | Disposition: A | Discharge status: Home / Self Care | Payer: Medicaid Other | Attending: Emergency Medicine | Admitting: Emergency Medicine

## 2021-01-06 DIAGNOSIS — J069 Acute upper respiratory infection, unspecified: Secondary | ICD-10-CM | POA: Diagnosis present

## 2021-01-06 LAB — POCT RAPID STREP A (OFFICE): Rapid Strep A Screen: NEGATIVE

## 2021-01-06 MED ORDER — IBUPROFEN 800 MG PO TABS: 800.0000 mg | ORAL_TABLET | Freq: Three times a day (TID) | ORAL | 0 refills | Status: DC

## 2021-01-06 MED ORDER — BENZONATATE 200 MG PO CAPS: 200.0000 mg | ORAL_CAPSULE | Freq: Three times a day (TID) | ORAL | 0 refills | Status: AC | PRN

## 2021-01-06 MED ORDER — NEOMYCIN-POLYMYXIN-HC 3.5-10000-1 OT SUSP: 4.0000 [drp] | Freq: Three times a day (TID) | OTIC | 0 refills | Status: AC

## 2021-01-06 MED ORDER — CETIRIZINE HCL 10 MG PO CAPS: 10.0000 mg | ORAL_CAPSULE | Freq: Every day | ORAL | 0 refills | Status: DC

## 2021-01-06 NOTE — Discharge Instructions (Addendum)
COVID test pending Daily cetirizine Tablet for congestion, throat irritation and postnasal drainage Tessalon every 8 hours for cough Continue salt water gargles, honey and hot tea  Use anti-inflammatories for pain/swelling. You may take up to 800 mg Ibuprofen every 8 hours with food. You may supplement Ibuprofen with Tylenol [phone] mg every 8 hours.

## 2021-01-06 NOTE — ED Triage Notes (Addendum)
Patient presents to Urgent Care with complaints of cough, left ear pain, sore throat, and nasal congestion x 2 days ago. Not treating symptoms. Pt states she "got her hair done 3 days and a lot of water got in ear." She is concerned with possible ear infection.   Denies fever, n/v, diarrhea, or abdominal pain.

## 2021-01-06 NOTE — ED Notes (Signed)
Pt declined COVID test states she had a negative test result yesterday.

## 2021-01-06 NOTE — Medical Student Note (Cosign Needed)
[facility] Provider Student Note For educational purposes for Medical, PA and NP students only and not part of the legal medical record.   CSN: [id number] Arrival date & time: 01/06/21  1013      History   Chief Complaint Chief Complaint  Patient presents with   Cough   Sore Throat   Nasal Congestion    HPI Jodi Norman is a 25 y.o. female.  Patient presents to [facility] for evaluation of sore throat and left otalgia. Patient endorses pain with swallowing and voice changes over the last few days with a day of frequent diarrhea on Monday that has since resolved. Patient states she has a daughter at home that goes to day care and last week she came home with cough and runny nose and is still experiencing some symptoms. Patient denies nausea or emesis. Reports pain 7/10 in left ear, 5/10 throat pain. Endorses salt gargles without significant improvement in symptoms   The history is provided by the patient.  Cough Associated symptoms: ear pain (Left ear) and sore throat   Associated symptoms: no chest pain and no wheezing   Sore Throat Pertinent negatives include no chest pain.   Past Medical History:  Diagnosis Date   Generalized anxiety disorder 06/24/2020    Patient Active Problem List   Diagnosis Date Noted   Bone lesion 10/27/2020   Daytime somnolence 07/11/2020   Other fatigue Q000111Q   Metabolic syndrome Q000111Q   History of pre-eclampsia  07/11/2020   Obesity (BMI 30-39.9) 07/11/2020   Insomnia 07/08/2020   Intractable migraine without aura and without status migrainosus 07/08/2020   Chest pain 07/08/2020   Generalized anxiety disorder 06/24/2020   Carpal tunnel syndrome, bilateral 05/07/2018   Cesarean delivery delivered 04/28/2018   Right hand paresthesia 06/19/2017    History reviewed. No pertinent surgical history.  OB History     Gravida  1   Para      Term      Preterm      AB      Living         SAB      IAB      Ectopic       Multiple      Live Births               Home Medications    Prior to Admission medications   Medication Sig Start Date End Date Taking? Authorizing Provider  clonazePAM (KLONOPIN) 0.5 MG tablet Take 1 tablet (0.5 mg total) by mouth 2 (two) times daily as needed for anxiety. 11/17/20   Carollee Herter, Kendrick Fries R, DO  escitalopram (LEXAPRO) 10 MG tablet TAKE 1 TABLET BY MOUTH EVERY DAY 12/08/20   Carollee Herter, Alferd Apa, DO  hydrOXYzine (ATARAX/VISTARIL) 25 MG tablet Take 1 tablet (25 mg total) by mouth every 6 (six) hours as needed. 06/22/20   Lucrezia Starch, MD  Vitamin D, Ergocalciferol, (DRISDOL) 1.25 MG (50000 UNIT) CAPS capsule Take 1 capsule (50,000 Units total) by mouth every 7 (seven) days. 11/17/20   Ann Held, DO    Family History Family History  Problem Relation Age of Onset   Hyperlipidemia Mother    Hypertension Mother    Hyperlipidemia Father    Hypertension Father    Diabetes Father     Social History Social History   Tobacco Use   Smoking status: Never   Smokeless tobacco: Never  Vaping Use   Vaping Use: Never used  Substance Use Topics   Alcohol use: Never   Drug use: Never     Allergies   Patient has no known allergies.   Review of Systems Review of Systems  HENT:  Positive for ear pain (Left ear), sore throat and voice change.   Respiratory:  Positive for cough. Negative for chest tightness and wheezing.   Cardiovascular:  Negative for chest pain.  Gastrointestinal:  Positive for diarrhea (Earlier in the week for x1 day). Negative for constipation, nausea and vomiting.  Psychiatric/Behavioral:         Patient endorses generalized anxiety with being sick and that her daughter was recently ill and still has lingering symptoms    Physical Exam Updated Vital Signs LMP 12/23/2020 (Approximate)   Physical Exam Constitutional:      General: She is not in acute distress.    Appearance: She is not toxic-appearing.  HENT:      Right Ear: Tympanic membrane, ear canal and external ear normal. No drainage or tenderness. Tympanic membrane is not perforated, erythematous, retracted or bulging.     Left Ear: Tympanic membrane and external ear normal. No drainage, swelling or tenderness. Tympanic membrane is not perforated, erythematous or retracted.     Ears:     Comments: Patient has a slight redness to the canal of the left ear    Mouth/Throat:     Lips: Pink.     Mouth: Mucous membranes are moist. No oral lesions.     Tongue: No lesions.     Tonsils: 0 on the right. 0 on the left.     ED Treatments / Results  Labs (all labs ordered are listed, but only abnormal results are displayed) Labs Reviewed  CULTURE, GROUP A STREP [facility])  POCT RAPID STREP A (OFFICE)     Initial Impression / Assessment and Plan / ED Course  I have reviewed the triage vital signs and the nursing notes.  Pertinent labs & imaging results that were available during my care of the patient were reviewed by me and considered in my medical decision making (see chart for  details).  R/o COVID with COVID test, pending at this time  Strep test negative  Treat ear canal inflammation with cortisporin ear drops.   Supportive care and symptomatic home treatment.

## 2021-01-06 NOTE — ED Provider Notes (Signed)
[facility]    CSN: [id number] Arrival date & time: 01/06/21  1013      History   Chief Complaint Chief Complaint  Patient presents with   Cough   Sore Throat   Nasal Congestion    HPI Jodi Norman is a 25 y.o. female presenting today for evaluation of URI symptoms.  Reports associated sore throat ear pain and hoarseness.  Reports symptoms began 2 to 3 days ago.  Denies fevers.  Reports children with similar symptoms.  No known COVID exposure.  At home test negative.  Main complaint has been sore throat and ear pain.  Reports left-sided discomfort around ear and jaw.  Diarrhea earlier in the week, but this is subsided.  HPI  Past Medical History:  Diagnosis Date   Generalized anxiety disorder 06/24/2020    Patient Active Problem List   Diagnosis Date Noted   Bone lesion 10/27/2020   Daytime somnolence 07/11/2020   Other fatigue Q000111Q   Metabolic syndrome Q000111Q   History of pre-eclampsia  07/11/2020   Obesity (BMI 30-39.9) 07/11/2020   Insomnia 07/08/2020   Intractable migraine without aura and without status migrainosus 07/08/2020   Chest pain 07/08/2020   Generalized anxiety disorder 06/24/2020   Carpal tunnel syndrome, bilateral 05/07/2018   Cesarean delivery delivered 04/28/2018   Right hand paresthesia 06/19/2017    History reviewed. No pertinent surgical history.  OB History     Gravida  1   Para      Term      Preterm      AB      Living         SAB      IAB      Ectopic      Multiple      Live Births               Home Medications    Prior to Admission medications   Medication Sig Start Date End Date Taking? Authorizing Provider  benzonatate (TESSALON) 200 MG capsule Take 1 capsule (200 mg total) by mouth 3 (three) times daily as needed for up to 7 days for cough. 01/06/21 01/13/21 Yes Tamiyah Moulin C, PA-C  Cetirizine HCl 10 MG CAPS Take 1 capsule (10 mg total) by mouth daily for 10 days. 01/06/21 01/16/21  Yes Nickalos Petersen C, PA-C  clonazePAM (KLONOPIN) 0.5 MG tablet Take 1 tablet (0.5 mg total) by mouth 2 (two) times daily as needed for anxiety. 11/17/20   Ann Held, DO  ibuprofen (ADVIL) 800 MG tablet Take 1 tablet (800 mg total) by mouth 3 (three) times daily. 01/06/21  Yes Amauris Debois C, PA-C  neomycin-polymyxin-hydrocortisone (CORTISPORIN) 3.5-10000-1 OTIC suspension Place 4 drops into the left ear 3 (three) times daily for 5 days. 01/06/21 01/11/21 Yes Payton Moder C, PA-C  hydrOXYzine (ATARAX/VISTARIL) 25 MG tablet Take 1 tablet (25 mg total) by mouth every 6 (six) hours as needed. 06/22/20   Lucrezia Starch, MD  Vitamin D, Ergocalciferol, (DRISDOL) 1.25 MG (50000 UNIT) CAPS capsule Take 1 capsule (50,000 Units total) by mouth every 7 (seven) days. 11/17/20   Ann Held, DO    Family History Family History  Problem Relation Age of Onset   Hyperlipidemia Mother    Hypertension Mother    Hyperlipidemia Father    Hypertension Father    Diabetes Father     Social History Social History   Tobacco Use   Smoking status: Never  Smokeless tobacco: Never  Vaping Use   Vaping Use: Never used  Substance Use Topics   Alcohol use: Never   Drug use: Never     Allergies   Patient has no known allergies.   Review of Systems Review of Systems  Constitutional:  Negative for activity change, appetite change, chills, fatigue and fever.  HENT:  Positive for ear pain, rhinorrhea, sore throat and voice change. Negative for congestion, sinus pressure and trouble swallowing.   Eyes:  Negative for discharge and redness.  Respiratory:  Positive for cough. Negative for chest tightness and shortness of breath.   Cardiovascular:  Negative for chest pain.  Gastrointestinal:  Negative for abdominal pain, diarrhea, nausea and vomiting.  Musculoskeletal:  Negative for myalgias.  Skin:  Negative for rash.  Neurological:  Negative for dizziness, light-headedness and  headaches.    Physical Exam Triage Vital Signs ED Triage Vitals  Enc Vitals Group     BP      Pulse      Resp      Temp      Temp src      SpO2      Weight      Height      Head Circumference      Peak Flow      Pain Score      Pain Loc      Pain Edu?      Excl. in [place]?    No data found.  Updated Vital Signs BP (P) 111/75 (BP Location: Left Arm)   Pulse (P) 77   Temp (P) 98.5 F (36.9 C) (Oral)   Resp (P) 16   LMP 12/23/2020 (Approximate)   SpO2 (P) 99%   Visual Acuity Right Eye Distance:   Left Eye Distance:   Bilateral Distance:    Right Eye Near:   Left Eye Near:    Bilateral Near:     Physical Exam Vitals and nursing note reviewed.  Constitutional:      Appearance: She is well-developed.     Comments: No acute distress  HENT:     Head: Normocephalic and atraumatic.     Ears:     Comments: Left ear: Minimal erythema noted to canal, left tragus tenderness     Nose: Nose normal.     Mouth/Throat:     Comments: Oral mucosa pink and moist, no tonsillar enlargement or exudate. Posterior pharynx patent and nonerythematous, no uvula deviation or swelling. Normal phonation.  Eyes:     Conjunctiva/sclera: Conjunctivae normal.  Cardiovascular:     Rate and Rhythm: Normal rate.  Pulmonary:     Effort: Pulmonary effort is normal. No respiratory distress.     Comments: Breathing comfortably at rest, CTABL, no wheezing, rales or other adventitious sounds auscultated  Abdominal:     General: There is no distension.  Musculoskeletal:        General: Normal range of motion.     Cervical back: Neck supple.  Skin:    General: Skin is warm and dry.  Neurological:     Mental Status: She is alert and oriented to person, place, and time.     UC Treatments / Results  Labs (all labs ordered are listed, but only abnormal results are displayed) Labs Reviewed  CULTURE, GROUP A STREP ([place])  NOVEL CORONAVIRUS, NAA  POCT RAPID STREP A (OFFICE)     EKG   Radiology No results found.  Procedures Procedures (including critical care time)  Medications  Ordered in UC Medications - No data to display  Initial Impression / Assessment and Plan / UC Course  I have reviewed the triage vital signs and the nursing notes.  Pertinent labs & imaging results that were available during my care of the patient were reviewed by me and considered in my medical decision making (see chart for details).     Viral URI with cough-Covid test pending, exam reassuring, lungs clear to auscultation, suspect viral etiology and recommend symptomatic and supportive care at this time.  Recommendations provided.  Did provide Cortisporin to try and help with left ear pain/possible early otitis externa.  Rest and fluids.  Continue to monitor.  Final Clinical Impressions(s) / UC Diagnoses   Final diagnoses:  Viral URI with cough     Discharge Instructions      COVID test pending Daily cetirizine Tablet for congestion, throat irritation and postnasal drainage Tessalon every 8 hours for cough Continue salt water gargles, honey and hot tea  Use anti-inflammatories for pain/swelling. You may take up to 800 mg Ibuprofen every 8 hours with food. You may supplement Ibuprofen with Tylenol [phone] mg every 8 hours.      ED Prescriptions     Medication Sig Dispense Auth. Provider   benzonatate (TESSALON) 200 MG capsule Take 1 capsule (200 mg total) by mouth 3 (three) times daily as needed for up to 7 days for cough. 28 capsule Janyiah Silveri C, PA-C   neomycin-polymyxin-hydrocortisone (CORTISPORIN) 3.5-10000-1 OTIC suspension Place 4 drops into the left ear 3 (three) times daily for 5 days. 10 mL Jove Beyl C, PA-C   ibuprofen (ADVIL) 800 MG tablet Take 1 tablet (800 mg total) by mouth 3 (three) times daily. 21 tablet Kaius Daino C, PA-C   Cetirizine HCl 10 MG CAPS Take 1 capsule (10 mg total) by mouth daily for 10 days. 10 capsule Koreena Joost,  [place] C, PA-C      PDMP not reviewed this encounter.   Janith Lima, PA-C 01/06/21 1141

## 2021-01-07 ENCOUNTER — Encounter (HOSPITAL_COMMUNITY): Payer: Self-pay | Admitting: Emergency Medicine

## 2021-01-07 ENCOUNTER — Emergency Department (HOSPITAL_COMMUNITY)
Admission: EM | Admit: 2021-01-07 | Discharge: 2021-01-07 | Disposition: A | Discharge status: Home / Self Care | Payer: Medicaid Other | Attending: Emergency Medicine | Admitting: Emergency Medicine

## 2021-01-07 ENCOUNTER — Other Ambulatory Visit: Payer: Self-pay

## 2021-01-07 DIAGNOSIS — M62838 Other muscle spasm: Secondary | ICD-10-CM | POA: Insufficient documentation

## 2021-01-07 DIAGNOSIS — R350 Frequency of micturition: Secondary | ICD-10-CM | POA: Insufficient documentation

## 2021-01-07 DIAGNOSIS — R3589 Other polyuria: Secondary | ICD-10-CM | POA: Diagnosis not present

## 2021-01-07 DIAGNOSIS — R059 Cough, unspecified: Secondary | ICD-10-CM | POA: Diagnosis present

## 2021-01-07 DIAGNOSIS — J069 Acute upper respiratory infection, unspecified: Secondary | ICD-10-CM | POA: Diagnosis not present

## 2021-01-07 DIAGNOSIS — H1032 Unspecified acute conjunctivitis, left eye: Secondary | ICD-10-CM | POA: Diagnosis not present

## 2021-01-07 LAB — CBC WITH DIFFERENTIAL/PLATELET
Abs Immature Granulocytes: 0.02 10*3/uL (ref 0.00–0.07)
Basophils Absolute: 0 10*3/uL (ref 0.0–0.1)
Basophils Relative: 0 %
Eosinophils Absolute: 0.2 10*3/uL (ref 0.0–0.5)
Eosinophils Relative: 2 %
HCT: 31.4 % — ABNORMAL LOW (ref 36.0–46.0)
Hemoglobin: 10.2 g/dL — ABNORMAL LOW (ref 12.0–15.0)
Immature Granulocytes: 0 %
Lymphocytes Relative: 32 %
Lymphs Abs: 2.2 10*3/uL (ref 0.7–4.0)
MCH: 27.3 pg (ref 26.0–34.0)
MCHC: 32.5 g/dL (ref 30.0–36.0)
MCV: 84.2 fL (ref 80.0–100.0)
Monocytes Absolute: 0.5 10*3/uL (ref 0.1–1.0)
Monocytes Relative: 8 %
Neutro Abs: 4 10*3/uL (ref 1.7–7.7)
Neutrophils Relative %: 58 %
Platelets: 246 10*3/uL (ref 150–400)
RBC: 3.73 MIL/uL — ABNORMAL LOW (ref 3.87–5.11)
RDW: 14.6 % (ref 11.5–15.5)
WBC: 7 10*3/uL (ref 4.0–10.5)
nRBC: 0 % (ref 0.0–0.2)

## 2021-01-07 LAB — COMPREHENSIVE METABOLIC PANEL
ALT: 18 U/L (ref 0–44)
AST: 19 U/L (ref 15–41)
Albumin: 3.8 g/dL (ref 3.5–5.0)
Alkaline Phosphatase: 99 U/L (ref 38–126)
Anion gap: 8 (ref 5–15)
BUN: 7 mg/dL (ref 6–20)
CO2: 26 mmol/L (ref 22–32)
Calcium: 8.9 mg/dL (ref 8.9–10.3)
Chloride: 106 mmol/L (ref 98–111)
Creatinine, Ser: 0.65 mg/dL (ref 0.44–1.00)
GFR, Estimated: >60 mL/min (ref >60)
Glucose, Bld: 98 mg/dL (ref 70–99)
Potassium: 4 mmol/L (ref 3.5–5.1)
Sodium: 140 mmol/L (ref 135–145)
Total Bilirubin: 0.3 mg/dL (ref 0.3–1.2)
Total Protein: 7.5 g/dL (ref 6.5–8.1)

## 2021-01-07 LAB — URINALYSIS, ROUTINE W REFLEX MICROSCOPIC
Bilirubin Urine: NEGATIVE
Glucose, UA: NEGATIVE mg/dL
Hgb urine dipstick: NEGATIVE
Ketones, ur: NEGATIVE mg/dL
Leukocytes,Ua: NEGATIVE
Nitrite: NEGATIVE
Protein, ur: NEGATIVE mg/dL
Specific Gravity, Urine: 1.006 (ref 1.005–1.030)
pH: 6 (ref 5.0–8.0)

## 2021-01-07 LAB — CBG MONITORING, ED: Glucose-Capillary: 115 mg/dL — ABNORMAL HIGH (ref 70–99)

## 2021-01-07 LAB — PREGNANCY, URINE: Preg Test, Ur: NEGATIVE

## 2021-01-07 MED ORDER — PREDNISONE 10 MG PO TABS: 40.0000 mg | ORAL_TABLET | Freq: Every day | ORAL | 0 refills | Status: AC

## 2021-01-07 MED ORDER — ERYTHROMYCIN 5 MG/GM OP OINT
TOPICAL_OINTMENT | Freq: Once | OPHTHALMIC | Status: AC
  Filled 2021-01-07: qty 3.5

## 2021-01-07 NOTE — ED Provider Notes (Signed)
[facility] DEPT Provider Note   CSN: [id number] Arrival date & time: 01/07/21  1654     History No chief complaint on file.   Jodi Norman is a 25 y.o. female.  HPI Patient is a 25 year old female with a medical history as noted below.  Patient states that about 4 to 5 days ago she began experiencing URI symptoms.  Reports sore throat, left ear pain, hoarseness, congestion, rhinorrhea, cough, left eye redness.  States that she has a young daughter who is experiencing similar symptoms prior to hers.  She was evaluated in urgent care yesterday and had a negative rapid strep test and currently has a COVID-19 test as well as a strep culture pending.  Also reports muscle spasms in the hands and feet as well as polyuria.  Denies any abdominal pain, dysuria, increase in vaginal discharge.    Past Medical History:  Diagnosis Date   Generalized anxiety disorder 06/24/2020    Patient Active Problem List   Diagnosis Date Noted   Bone lesion 10/27/2020   Daytime somnolence 07/11/2020   Other fatigue Q000111Q   Metabolic syndrome Q000111Q   History of pre-eclampsia  07/11/2020   Obesity (BMI 30-39.9) 07/11/2020   Insomnia 07/08/2020   Intractable migraine without aura and without status migrainosus 07/08/2020   Chest pain 07/08/2020   Generalized anxiety disorder 06/24/2020   Carpal tunnel syndrome, bilateral 05/07/2018   Cesarean delivery delivered 04/28/2018   Right hand paresthesia 06/19/2017    History reviewed. No pertinent surgical history.   OB History     Gravida  1   Para      Term      Preterm      AB      Living         SAB      IAB      Ectopic      Multiple      Live Births              Family History  Problem Relation Age of Onset   Hyperlipidemia Mother    Hypertension Mother    Hyperlipidemia Father    Hypertension Father    Diabetes Father     Social History   Tobacco Use   Smoking status: Never    Smokeless tobacco: Never  Vaping Use   Vaping Use: Never used  Substance Use Topics   Alcohol use: Never   Drug use: Never    Home Medications Prior to Admission medications   Medication Sig Start Date End Date Taking? Authorizing Provider  clonazePAM (KLONOPIN) 0.5 MG tablet Take 1 tablet (0.5 mg total) by mouth 2 (two) times daily as needed for anxiety. 11/17/20   Ann Held, DO  predniSONE (DELTASONE) 10 MG tablet Take 4 tablets (40 mg total) by mouth daily with breakfast for 5 days. 01/07/21 01/12/21 Yes Rayna Sexton, PA-C  benzonatate (TESSALON) 200 MG capsule Take 1 capsule (200 mg total) by mouth 3 (three) times daily as needed for up to 7 days for cough. 01/06/21 01/13/21  Wieters, Hallie C, PA-C  Cetirizine HCl 10 MG CAPS Take 1 capsule (10 mg total) by mouth daily for 10 days. 01/06/21 01/16/21  Wieters, Hallie C, PA-C  hydrOXYzine (ATARAX/VISTARIL) 25 MG tablet Take 1 tablet (25 mg total) by mouth every 6 (six) hours as needed. 06/22/20   Lucrezia Starch, MD  ibuprofen (ADVIL) 800 MG tablet Take 1 tablet (800 mg total) by mouth 3 (  three) times daily. 01/06/21   Wieters, Hallie C, PA-C  neomycin-polymyxin-hydrocortisone (CORTISPORIN) 3.5-10000-1 OTIC suspension Place 4 drops into the left ear 3 (three) times daily for 5 days. 01/06/21 01/11/21  Wieters, Hallie C, PA-C  Vitamin D, Ergocalciferol, (DRISDOL) 1.25 MG (50000 UNIT) CAPS capsule Take 1 capsule (50,000 Units total) by mouth every 7 (seven) days. 11/17/20   Ann Held, DO    Allergies    Patient has no known allergies.  Review of Systems   Review of Systems  Constitutional:  Positive for fatigue.  HENT:  Positive for congestion, ear pain, postnasal drip, rhinorrhea and sore throat.   Eyes:  Positive for redness.  Respiratory:  Positive for cough.   Genitourinary:  Positive for frequency. Negative for dysuria and vaginal discharge.   Physical Exam Updated Vital Signs BP 123/69 (BP Location: Left  Arm)   Pulse (!) 103   Temp 98.5 F (36.9 C) (Oral)   Resp 16   LMP 12/23/2020 (Approximate)   SpO2 100%   Physical Exam Vitals and nursing note reviewed.  Constitutional:      General: She is not in acute distress.    Appearance: Normal appearance. She is normal weight. She is not ill-appearing, toxic-appearing or diaphoretic.  HENT:     Head: Normocephalic and atraumatic.     Right Ear: Tympanic membrane, ear canal and external ear normal. There is no impacted cerumen.     Left Ear: External ear normal.     Ears:     Comments: Right ear, EAC, and TM appears normal.  Left ear appears normal.  EAC appears normal but TM does appear mildly erythematous.  TM has good bony landmarks and cone of light.  No middle ear effusion noted.    Nose: Nose normal.     Mouth/Throat:     Mouth: Mucous membranes are moist.     Pharynx: Oropharynx is clear. No oropharyngeal exudate or posterior oropharyngeal erythema.  Eyes:     General: No scleral icterus.       Right eye: No discharge.        Left eye: No discharge.     Extraocular Movements: Extraocular movements intact.     Conjunctiva/sclera: Conjunctivae normal.     Pupils: Pupils are equal, round, and reactive to light.     Comments: Pupils are equal, round and reactive to light.  Extraocular movements are intact.  Left eye with injected sclera.  No obvious discharge.  Sclera in the right eye is clear.  No discharge.  Cardiovascular:     Rate and Rhythm: Normal rate and regular rhythm.     Pulses: Normal pulses.     Heart sounds: Normal heart sounds. No murmur heard.   No friction rub. No gallop.  Pulmonary:     Effort: Pulmonary effort is normal. No respiratory distress.     Breath sounds: Normal breath sounds. No stridor. No wheezing, rhonchi or rales.  Abdominal:     General: Abdomen is flat.     Palpations: Abdomen is soft.     Tenderness: There is no abdominal tenderness.  Musculoskeletal:        General: Normal range of  motion.     Cervical back: Normal range of motion and neck supple. No tenderness.  Skin:    General: Skin is warm and dry.  Neurological:     General: No focal deficit present.     Mental Status: She is alert and oriented to person, place, and  time.  Psychiatric:        Mood and Affect: Mood normal.        Behavior: Behavior normal.   ED Results / Procedures / Treatments   Labs (all labs ordered are listed, but only abnormal results are displayed) Labs Reviewed  URINALYSIS, ROUTINE W REFLEX MICROSCOPIC - Abnormal; Notable for the following components:      Result Value   Color, Urine STRAW (*)    All other components within normal limits  CBC WITH DIFFERENTIAL/PLATELET - Abnormal; Notable for the following components:   RBC 3.73 (*)    Hemoglobin 10.2 (*)    HCT 31.4 (*)    All other components within normal limits  CBG MONITORING, ED - Abnormal; Notable for the following components:   Glucose-Capillary 115 (*)    All other components within normal limits  COMPREHENSIVE METABOLIC PANEL  PREGNANCY, URINE   EKG None  Radiology No results found.  Procedures Procedures   Medications Ordered in ED Medications  erythromycin ophthalmic ointment ( Left Eye Given 01/07/21 1900)    ED Course  I have reviewed the triage vital signs and the nursing notes.  Pertinent labs & imaging results that were available during my care of the patient were reviewed by me and considered in my medical decision making (see chart for details).    MDM Rules/Calculators/A&P                          Pt is a 25 y.o. female who presents to the emergency department due to symptoms associated with a viral URI.  Appears to also have developing conjunctivitis in the left eye.  Labs: CBC with a hemoglobin of 10.2. CMP within normal limits. UA within normal limits. CBG of 115.  I, Rayna Sexton, PA-C, personally reviewed and evaluated these images and lab results as part of my medical  decision-making.  Patient evaluated at urgent care yesterday as well.  Her strep culture as well as her COVID-19 test are pending.  Urged patient to make sure she checks these results on MyChart.  She was discharged yesterday on Tessalon Perles, Cortisporin, ibuprofen, as well as cetirizine.  Patient with persistent sore throat, cough, and now also notes left eye redness and increased lacrimation as well as muscle cramping.  Patient requested I obtain basic labs and these were reassuring.  No electrolyte abnormalities.  She also notes some polyuria so we checked a CBG as well which was only mildly elevated at 115 and her UA was reassuring.  Will discharge on an additional course of prednisone for her sore throat.  We will also give patient erythromycin ophthalmic ointment.  First dose given in the emergency department.  Feel the patient is stable for discharge at this time and she is agreeable.  Her questions were answered and she was amicable at the time of discharge.  Note: Portions of this report may have been transcribed using voice recognition software. Every effort was made to ensure accuracy; however, inadvertent computerized transcription errors may be present.   Final Clinical Impression(s) / ED Diagnoses Final diagnoses:  Viral URI with cough  Acute conjunctivitis of left eye, unspecified acute conjunctivitis type  Urinary frequency   Rx / DC Orders ED Discharge Orders          Ordered    predniSONE (DELTASONE) 10 MG tablet  Daily with breakfast        01/07/21 1913  Rayna Sexton, PA-C [id number] [id number]    Lianne Cure, DO Q000111Q 1901

## 2021-01-07 NOTE — ED Triage Notes (Signed)
Pt c/o cough, feeling faint, losing voice and body cramps x 2 days. Also notes redness in L eye.

## 2021-01-07 NOTE — Discharge Instructions (Addendum)
I am prescribing you a strong steroid medication called prednisone.  Please only take this as prescribed.  Please take it early in the morning, as this medication can be stimulating and make it difficult to sleep at night.  Please continue using your erythromycin ointment 4 times a day for 7 days.  Please continue to monitor your symptoms closely.  If they worsen, please come back to the emergency department.  Otherwise, please follow-up with your regular doctor.  It was a pleasure to meet you.

## 2021-01-08 LAB — NOVEL CORONAVIRUS, NAA: SARS-CoV-2, NAA: NOT DETECTED

## 2021-01-08 LAB — SARS-COV-2, NAA 2 DAY TAT

## 2021-01-09 LAB — CULTURE, GROUP A STREP (THRC)

## 2021-01-18 ENCOUNTER — Other Ambulatory Visit: Payer: Self-pay

## 2021-01-18 ENCOUNTER — Emergency Department (HOSPITAL_BASED_OUTPATIENT_CLINIC_OR_DEPARTMENT_OTHER)
Admission: EM | Admit: 2021-01-18 | Discharge: 2021-01-18 | Disposition: A | Discharge status: Home / Self Care | Payer: Medicaid Other | Attending: Emergency Medicine | Admitting: Emergency Medicine

## 2021-01-18 ENCOUNTER — Encounter (HOSPITAL_BASED_OUTPATIENT_CLINIC_OR_DEPARTMENT_OTHER): Payer: Self-pay

## 2021-01-18 DIAGNOSIS — Z01812 Encounter for preprocedural laboratory examination: Secondary | ICD-10-CM | POA: Insufficient documentation

## 2021-01-18 DIAGNOSIS — Z0189 Encounter for other specified special examinations: Secondary | ICD-10-CM

## 2021-01-18 NOTE — Discharge Instructions (Signed)
Check MyChart for results.

## 2021-01-18 NOTE — ED Provider Notes (Signed)
[facility] EMERGENCY DEPARTMENT Provider Note   CSN: [id number] Arrival date & time: 01/18/21  1147     History Chief Complaint  Patient presents with   Labs Only    Jodi Norman is a 25 y.o. female.  Presents to ER requesting blood draw for TB test for school.  No prior history of TB, no concern for TB exposure.  She has no medical complaints.  HPI     Past Medical History:  Diagnosis Date   Generalized anxiety disorder 06/24/2020    Patient Active Problem List   Diagnosis Date Noted   Bone lesion 10/27/2020   Daytime somnolence 07/11/2020   Other fatigue Q000111Q   Metabolic syndrome Q000111Q   History of pre-eclampsia  07/11/2020   Obesity (BMI 30-39.9) 07/11/2020   Insomnia 07/08/2020   Intractable migraine without aura and without status migrainosus 07/08/2020   Chest pain 07/08/2020   Generalized anxiety disorder 06/24/2020   Carpal tunnel syndrome, bilateral 05/07/2018   Cesarean delivery delivered 04/28/2018   Right hand paresthesia 06/19/2017    History reviewed. No pertinent surgical history.   OB History     Gravida  1   Para      Term      Preterm      AB      Living         SAB      IAB      Ectopic      Multiple      Live Births              Family History  Problem Relation Age of Onset   Hyperlipidemia Mother    Hypertension Mother    Hyperlipidemia Father    Hypertension Father    Diabetes Father     Social History   Tobacco Use   Smoking status: Never   Smokeless tobacco: Never  Vaping Use   Vaping Use: Never used  Substance Use Topics   Alcohol use: Never   Drug use: Never    Home Medications Prior to Admission medications   Medication Sig Start Date End Date Taking? Authorizing Provider  clonazePAM (KLONOPIN) 0.5 MG tablet Take 1 tablet (0.5 mg total) by mouth 2 (two) times daily as needed for anxiety. 11/17/20   Ann Held, DO  Cetirizine HCl 10 MG CAPS Take 1 capsule (10 mg  total) by mouth daily for 10 days. 01/06/21 01/16/21  Wieters, Hallie C, PA-C  hydrOXYzine (ATARAX/VISTARIL) 25 MG tablet Take 1 tablet (25 mg total) by mouth every 6 (six) hours as needed. 06/22/20   Lucrezia Starch, MD  ibuprofen (ADVIL) 800 MG tablet Take 1 tablet (800 mg total) by mouth 3 (three) times daily. 01/06/21   Wieters, Hallie C, PA-C  Vitamin D, Ergocalciferol, (DRISDOL) 1.25 MG (50000 UNIT) CAPS capsule Take 1 capsule (50,000 Units total) by mouth every 7 (seven) days. 11/17/20   Ann Held, DO    Allergies    Patient has no known allergies.  Review of Systems   Review of Systems  All other systems reviewed and are negative.  Physical Exam Updated Vital Signs BP 122/77 (BP Location: Left Arm)   Pulse 74   Temp 98.5 F (36.9 C) (Oral)   Resp 15   Ht '5\' 7"'$  (1.702 m)   Wt 108.4 kg   LMP 01/10/2021 (Approximate)   SpO2 100%   BMI 37.43 kg/m   Physical Exam Vitals and nursing note reviewed.  Constitutional:      Appearance: Normal appearance.  HENT:     Head: Normocephalic and atraumatic.     Nose: Nose normal.  Eyes:     Pupils: Pupils are equal, round, and reactive to light.  Cardiovascular:     Comments: Warm and well perfused Pulmonary:     Comments: Even and unlabored Musculoskeletal:        General: No deformity or signs of injury.  Skin:    Coloration: Skin is not jaundiced or pale.  Neurological:     Mental Status: She is alert.     Comments: alert  Psychiatric:        Mood and Affect: Mood normal.        Behavior: Behavior normal.    ED Results / Procedures / Treatments   Labs (all labs ordered are listed, but only abnormal results are displayed) Labs Reviewed  QUANTIFERON-TB GOLD PLUS    EKG None  Radiology No results found.  Procedures Procedures   Medications Ordered in ED Medications - No data to display  ED Course  I have reviewed the triage vital signs and the nursing notes.  Pertinent labs & imaging results  that were available during my care of the patient were reviewed by me and considered in my medical decision making (see chart for details).    MDM Rules/Calculators/A&P                           25 year old presents to ER requesting laboratory testing for TB test.  She has no medical complaints.  She appears well and has stable vital signs.  Will send TB test.  Final Clinical Impression(s) / ED Diagnoses Final diagnoses:  Encounter for laboratory test    Rx / DC Orders ED Discharge Orders     None        Lucrezia Starch, MD 01/18/21 1219

## 2021-01-18 NOTE — ED Triage Notes (Signed)
Pt states she needs blood draw for TB screen for school-[facility] Shark, charge RN spoke with lab and state they do have kits-NAD-steady gait

## 2021-01-23 NOTE — ED Notes (Signed)
01/23/2021   [facility] called and stated they were unable to run quantiferon tb due to to much blood in tubes

## 2021-01-24 ENCOUNTER — Ambulatory Visit (INDEPENDENT_AMBULATORY_CARE_PROVIDER_SITE_OTHER): Payer: Medicaid Other

## 2021-01-24 ENCOUNTER — Other Ambulatory Visit: Payer: Self-pay | Admitting: Family Medicine

## 2021-01-24 ENCOUNTER — Other Ambulatory Visit: Payer: Self-pay

## 2021-01-24 DIAGNOSIS — Z111 Encounter for screening for respiratory tuberculosis: Secondary | ICD-10-CM | POA: Diagnosis not present

## 2021-01-24 NOTE — Progress Notes (Signed)
Pt here today for PPD placement. Needs to return to clinic in 48 hours.    Tubersol 0.93m injected into L forearm. Pt tolerated well.   Pt instructed to come back on Thursday 01/26/21 at 3pm for read. Informed if she is late or does not come back she would have to have injection performed again. Pt verbalized understanding.

## 2021-01-26 ENCOUNTER — Other Ambulatory Visit: Payer: Self-pay

## 2021-01-26 ENCOUNTER — Ambulatory Visit: Payer: Medicaid Other

## 2021-01-26 LAB — TB SKIN TEST
Induration: 0 mm
TB Skin Test: NEGATIVE

## 2021-01-26 NOTE — Addendum Note (Signed)
Addended by: Kem Boroughs D on: 01/26/2021 03:41 PM   Modules accepted: Orders

## 2021-02-27 ENCOUNTER — Emergency Department (HOSPITAL_BASED_OUTPATIENT_CLINIC_OR_DEPARTMENT_OTHER)
Admission: EM | Admit: 2021-02-27 | Discharge: 2021-02-27 | Disposition: A | Discharge status: Home / Self Care | Payer: Medicaid Other | Attending: Emergency Medicine | Admitting: Emergency Medicine

## 2021-02-27 ENCOUNTER — Encounter: Payer: Self-pay | Admitting: *Deleted

## 2021-02-27 ENCOUNTER — Encounter (HOSPITAL_BASED_OUTPATIENT_CLINIC_OR_DEPARTMENT_OTHER): Payer: Self-pay | Admitting: Emergency Medicine

## 2021-02-27 ENCOUNTER — Other Ambulatory Visit: Payer: Self-pay

## 2021-02-27 DIAGNOSIS — Z5321 Procedure and treatment not carried out due to patient leaving prior to being seen by health care provider: Secondary | ICD-10-CM | POA: Diagnosis not present

## 2021-02-27 DIAGNOSIS — R42 Dizziness and giddiness: Secondary | ICD-10-CM | POA: Insufficient documentation

## 2021-02-27 NOTE — ED Triage Notes (Signed)
Pt c/o dizziness and feeling faint when standing up today

## 2021-02-28 ENCOUNTER — Emergency Department (HOSPITAL_BASED_OUTPATIENT_CLINIC_OR_DEPARTMENT_OTHER)
Admission: EM | Admit: 2021-02-28 | Discharge: 2021-03-01 | Disposition: A | Discharge status: Home / Self Care | Payer: Medicaid Other | Attending: Emergency Medicine | Admitting: Emergency Medicine

## 2021-02-28 DIAGNOSIS — K0889 Other specified disorders of teeth and supporting structures: Secondary | ICD-10-CM | POA: Diagnosis not present

## 2021-02-28 DIAGNOSIS — R42 Dizziness and giddiness: Secondary | ICD-10-CM | POA: Insufficient documentation

## 2021-02-28 LAB — CBC
HCT: 32 % — ABNORMAL LOW (ref 36.0–46.0)
Hemoglobin: 10.6 g/dL — ABNORMAL LOW (ref 12.0–15.0)
MCH: 27.3 pg (ref 26.0–34.0)
MCHC: 33.1 g/dL (ref 30.0–36.0)
MCV: 82.5 fL (ref 80.0–100.0)
Platelets: 267 10*3/uL (ref 150–400)
RBC: 3.88 MIL/uL (ref 3.87–5.11)
RDW: 14.8 % (ref 11.5–15.5)
WBC: 7.1 10*3/uL (ref 4.0–10.5)
nRBC: 0 % (ref 0.0–0.2)

## 2021-02-28 LAB — URINALYSIS, ROUTINE W REFLEX MICROSCOPIC
Bilirubin Urine: NEGATIVE
Glucose, UA: NEGATIVE mg/dL
Hgb urine dipstick: NEGATIVE
Ketones, ur: NEGATIVE mg/dL
Leukocytes,Ua: NEGATIVE
Nitrite: NEGATIVE
Protein, ur: NEGATIVE mg/dL
Specific Gravity, Urine: 1.025 (ref 1.005–1.030)
pH: 6.5 (ref 5.0–8.0)

## 2021-02-28 LAB — BASIC METABOLIC PANEL
Anion gap: 7 (ref 5–15)
BUN: 11 mg/dL (ref 6–20)
CO2: 25 mmol/L (ref 22–32)
Calcium: 9.1 mg/dL (ref 8.9–10.3)
Chloride: 103 mmol/L (ref 98–111)
Creatinine, Ser: 0.75 mg/dL (ref 0.44–1.00)
GFR, Estimated: >60 mL/min (ref >60)
Glucose, Bld: 100 mg/dL — ABNORMAL HIGH (ref 70–99)
Potassium: 3.8 mmol/L (ref 3.5–5.1)
Sodium: 135 mmol/L (ref 135–145)

## 2021-02-28 LAB — PREGNANCY, URINE: Preg Test, Ur: NEGATIVE

## 2021-02-28 MED ORDER — AMOXICILLIN 500 MG PO CAPS: 500.0000 mg | ORAL_CAPSULE | Freq: Two times a day (BID) | ORAL | 0 refills | Status: DC

## 2021-02-28 MED ORDER — FLUCONAZOLE 150 MG PO TABS: 150.0000 mg | ORAL_TABLET | Freq: Every day | ORAL | 0 refills | Status: DC

## 2021-02-28 MED ORDER — IBUPROFEN 400 MG PO TABS: 400.0000 mg | ORAL_TABLET | Freq: Three times a day (TID) | ORAL | 0 refills | Status: DC | PRN

## 2021-02-28 MED ORDER — MECLIZINE HCL 25 MG PO TABS: 25.0000 mg | ORAL_TABLET | Freq: Two times a day (BID) | ORAL | 0 refills | Status: DC | PRN

## 2021-02-28 MED ORDER — IBUPROFEN 400 MG PO TABS
400.0000 mg | ORAL_TABLET | Freq: Once | ORAL | Status: AC
  Administered 2021-02-28: 400 mg via ORAL
  Filled 2021-02-28: qty 1

## 2021-02-28 NOTE — ED Triage Notes (Signed)
Pt c/o dizziness and feeling faint x 1 week.

## 2021-03-01 MED ORDER — AMOXICILLIN 500 MG PO CAPS
500.0000 mg | ORAL_CAPSULE | Freq: Once | ORAL | Status: AC
  Administered 2021-03-01: 500 mg via ORAL
  Filled 2021-03-01: qty 1

## 2021-03-01 NOTE — ED Provider Notes (Signed)
[facility] EMERGENCY DEPARTMENT Provider Note   CSN: [id number] Arrival date & time: 02/28/21  1853     History Chief Complaint  Patient presents with   Dizziness    Jodi Norman is a 25 y.o. female.  The history is provided by the patient.  Dizziness Quality:  Unable to specify Severity:  Moderate Onset quality:  Gradual Timing:  Intermittent Progression:  Waxing and waning Chronicity:  New Relieved by: rest. Worsened by:  Standing up (walking) Associated symptoms: no chest pain, no headaches, no shortness of breath, no tinnitus, no vomiting and no weakness   Risk factors: no new medications   Patient presents with feeling of dizziness.  She has difficult time describing these symptoms.  She reports it does seem to be worse with standing.  No syncope.  No active chest pain is reported.  She reports that she feels "woozy " No new meds. She does report fighting a dental infection intermittently for the past year.  There is no facial swelling, but it hurts to chew.  She has not been on antibiotics recently.   Reports she has had some headache but not at this time Past Medical History:  Diagnosis Date   Generalized anxiety disorder 06/24/2020    Patient Active Problem List   Diagnosis Date Noted   Bone lesion 10/27/2020   Daytime somnolence 07/11/2020   Other fatigue 70/35/0093   Metabolic syndrome 81/82/9937   History of pre-eclampsia  07/11/2020   Obesity (BMI 30-39.9) 07/11/2020   Insomnia 07/08/2020   Intractable migraine without aura and without status migrainosus 07/08/2020   Chest pain 07/08/2020   Generalized anxiety disorder 06/24/2020   Carpal tunnel syndrome, bilateral 05/07/2018   Cesarean delivery delivered 04/28/2018   Right hand paresthesia 06/19/2017    No past surgical history on file.   OB History     Gravida  1   Para      Term      Preterm      AB      Living         SAB      IAB      Ectopic      Multiple       Live Births              Family History  Problem Relation Age of Onset   Hyperlipidemia Mother    Hypertension Mother    Hyperlipidemia Father    Hypertension Father    Diabetes Father     Social History   Tobacco Use   Smoking status: Never   Smokeless tobacco: Never  Vaping Use   Vaping Use: Never used  Substance Use Topics   Alcohol use: Never   Drug use: Never    Home Medications Prior to Admission medications   Medication Sig Start Date End Date Taking? Authorizing Provider  amoxicillin (AMOXIL) 500 MG capsule Take 1 capsule (500 mg total) by mouth 2 (two) times daily. 02/28/21  Yes Ripley Fraise, MD  clonazePAM (KLONOPIN) 0.5 MG tablet Take 1 tablet (0.5 mg total) by mouth 2 (two) times daily as needed for anxiety. 11/17/20   Ann Held, DO  fluconazole (DIFLUCAN) 150 MG tablet Take 1 tablet (150 mg total) by mouth daily. 02/28/21  Yes Ripley Fraise, MD  ibuprofen (ADVIL) 400 MG tablet Take 1 tablet (400 mg total) by mouth every 8 (eight) hours as needed for moderate pain. 02/28/21  Yes Ripley Fraise, MD  meclizine Johnathan Hausen)  25 MG tablet Take 1 tablet (25 mg total) by mouth 2 (two) times daily as needed for dizziness. 02/28/21  Yes Ripley Fraise, MD  Cetirizine HCl 10 MG CAPS Take 1 capsule (10 mg total) by mouth daily for 10 days. 01/06/21 01/16/21  Wieters, Hallie C, PA-C  Vitamin D, Ergocalciferol, (DRISDOL) 1.25 MG (50000 UNIT) CAPS capsule Take 1 capsule (50,000 Units total) by mouth every 7 (seven) days. 11/17/20   Ann Held, DO    Allergies    Patient has no known allergies.  Review of Systems   Review of Systems  Constitutional:  Negative for fever.  HENT:  Positive for dental problem. Negative for tinnitus.   Eyes:        Brief blurred vision that is improved  Respiratory:  Negative for shortness of breath.   Cardiovascular:  Negative for chest pain.  Gastrointestinal:  Negative for vomiting.  Neurological:  Positive  for dizziness. Negative for syncope, weakness and headaches.  All other systems reviewed and are negative.  Physical Exam Updated Vital Signs BP 134/77 (BP Location: Right Arm)   Pulse 90   Temp 99.5 F (37.5 C) (Oral)   Resp 18   Ht 1.702 m (5\' 7" )   Wt 107.5 kg   LMP 01/30/2021   SpO2 100%   BMI 37.12 kg/m   Physical Exam CONSTITUTIONAL: Well developed/well nourished HEAD: Normocephalic/atraumatic EYES: EOMI/PERRL, no nystagmus, no ptosis ENMT: Mucous membranes moist, no facial swelling, no stridor, no drooling, no obvious gingival abscess NECK: supple no meningeal signs, no bruits CV: S1/S2 noted, no murmurs/rubs/gallops noted LUNGS: Lungs are clear to auscultation bilaterally, no apparent distress ABDOMEN: soft, nontender NEURO:Awake/alert, face symmetric, no arm or leg drift is noted Equal 5/5 strength with shoulder abduction, elbow flex/extension, wrist flex/extension in upper extremities and equal hand grips bilaterally Equal 5/5 strength with hip flexion,knee flex/extension, foot dorsi/plantar flexion Cranial nerves 3/4/5/6/12/03/08/11/12 tested and intact Gait normal without ataxia Sensation to light touch intact in all extremities EXTREMITIES: pulses normal, full ROM SKIN: warm, color normal PSYCH: no abnormalities of mood noted  ED Results / Procedures / Treatments   Labs (all labs ordered are listed, but only abnormal results are displayed) Labs Reviewed  BASIC METABOLIC PANEL - Abnormal; Notable for the following components:      Result Value   Glucose, Bld 100 (*)    All other components within normal limits  CBC - Abnormal; Notable for the following components:   Hemoglobin 10.6 (*)    HCT 32.0 (*)    All other components within normal limits  URINALYSIS, ROUTINE W REFLEX MICROSCOPIC  PREGNANCY, URINE    EKG EKG Interpretation  Date/Time:  Tuesday February 28 2021 19:15:35 EDT Ventricular Rate:  92 PR Interval:  136 QRS Duration: 88 QT  Interval:  340 QTC Calculation: 420 R Axis:   47 Text Interpretation: Normal sinus rhythm Normal ECG Confirmed by Ripley Fraise [phone]) on 02/28/2021 11:23:37 PM  Radiology No results found.  Procedures Procedures   Medications Ordered in ED Medications  ibuprofen (ADVIL) tablet 400 mg (400 mg Oral Given 02/28/21 2358)  amoxicillin (AMOXIL) capsule 500 mg (500 mg Oral Given 03/01/21 0015)    ED Course  I have reviewed the triage vital signs and the nursing notes.  Pertinent labs results that were available during my care of the patient were reviewed by me and considered in my medical decision making (see chart for details).    MDM Rules/Calculators/A&P  Patient presents with feeling woozy and dizzy.  She is unable to describe the symptoms completely, but overall she is well-appearing.  She can ambulate without difficulty.  No signs of orthostatic hypotension. Overall her work-up including EKG and labs of been unremarkable No signs of acute stroke given her exam Will trial use of meclizine as needed for dizziness.  Patient also requests antibiotic for her dental infection, as well as Diflucan in case she gets a yeast infection.  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 Since she has no active headache or focal neurodeficits, will defer imaging at this time Final Clinical Impression(s) / ED Diagnoses Final diagnoses:  Dizziness  Pain, dental    Rx / DC Orders ED Discharge Orders          Ordered    amoxicillin (AMOXIL) 500 MG capsule  2 times daily        02/28/21 2339    fluconazole (DIFLUCAN) 150 MG tablet  Daily        02/28/21 2339    meclizine (ANTIVERT) 25 MG tablet  2 times daily PRN        02/28/21 2339    ibuprofen (ADVIL) 400 MG tablet  Every 8 hours PRN        02/28/21 2342             Ripley Fraise, MD 03/01/21 [phone]

## 2021-03-01 NOTE — Discharge Instructions (Signed)

## 2021-03-06 ENCOUNTER — Ambulatory Visit (INDEPENDENT_AMBULATORY_CARE_PROVIDER_SITE_OTHER): Payer: Medicaid Other | Admitting: Family Medicine

## 2021-03-06 ENCOUNTER — Ambulatory Visit (HOSPITAL_BASED_OUTPATIENT_CLINIC_OR_DEPARTMENT_OTHER)
Admission: RE | Admit: 2021-03-06 | Discharge: 2021-03-06 | Disposition: A | Discharge status: Home / Self Care | Payer: Medicaid Other | Source: Ambulatory Visit | Attending: Family Medicine | Admitting: Family Medicine

## 2021-03-06 ENCOUNTER — Inpatient Hospital Stay: Payer: Medicaid Other | Attending: Hematology & Oncology

## 2021-03-06 ENCOUNTER — Encounter: Payer: Self-pay | Admitting: Family Medicine

## 2021-03-06 ENCOUNTER — Other Ambulatory Visit: Payer: Self-pay

## 2021-03-06 ENCOUNTER — Encounter: Payer: Self-pay | Admitting: *Deleted

## 2021-03-06 ENCOUNTER — Other Ambulatory Visit: Payer: Self-pay | Admitting: *Deleted

## 2021-03-06 VITALS — BP 112/82 | HR 75 | Temp 98.1°F | Resp 18 | Ht 67.0 in | Wt 242.8 lb

## 2021-03-06 DIAGNOSIS — M546 Pain in thoracic spine: Secondary | ICD-10-CM | POA: Insufficient documentation

## 2021-03-06 DIAGNOSIS — Z8739 Personal history of other diseases of the musculoskeletal system and connective tissue: Secondary | ICD-10-CM | POA: Insufficient documentation

## 2021-03-06 DIAGNOSIS — G8929 Other chronic pain: Secondary | ICD-10-CM | POA: Insufficient documentation

## 2021-03-06 DIAGNOSIS — C7951 Secondary malignant neoplasm of bone: Secondary | ICD-10-CM

## 2021-03-06 DIAGNOSIS — M899 Disorder of bone, unspecified: Secondary | ICD-10-CM

## 2021-03-06 DIAGNOSIS — M549 Dorsalgia, unspecified: Secondary | ICD-10-CM | POA: Diagnosis not present

## 2021-03-06 LAB — COMPREHENSIVE METABOLIC PANEL
ALT: 9 U/L (ref 0–44)
AST: 14 U/L — ABNORMAL LOW (ref 15–41)
Albumin: 4.3 g/dL (ref 3.5–5.0)
Alkaline Phosphatase: 90 U/L (ref 38–126)
Anion gap: 7 (ref 5–15)
BUN: 9 mg/dL (ref 6–20)
CO2: 27 mmol/L (ref 22–32)
Calcium: 9.6 mg/dL (ref 8.9–10.3)
Chloride: 104 mmol/L (ref 98–111)
Creatinine, Ser: 0.73 mg/dL (ref 0.44–1.00)
GFR, Estimated: >60 mL/min (ref >60)
Glucose, Bld: 90 mg/dL (ref 70–99)
Potassium: 4.1 mmol/L (ref 3.5–5.1)
Sodium: 138 mmol/L (ref 135–145)
Total Bilirubin: 0.3 mg/dL (ref 0.3–1.2)
Total Protein: 7.3 g/dL (ref 6.5–8.1)

## 2021-03-06 LAB — CBC WITH DIFFERENTIAL (CANCER CENTER ONLY)
Abs Immature Granulocytes: 0.03 10*3/uL (ref 0.00–0.07)
Basophils Absolute: 0 10*3/uL (ref 0.0–0.1)
Basophils Relative: 0 %
Eosinophils Absolute: 0.1 10*3/uL (ref 0.0–0.5)
Eosinophils Relative: 3 %
HCT: 31.5 % — ABNORMAL LOW (ref 36.0–46.0)
Hemoglobin: 10.5 g/dL — ABNORMAL LOW (ref 12.0–15.0)
Immature Granulocytes: 1 %
Lymphocytes Relative: 38 %
Lymphs Abs: 2 10*3/uL (ref 0.7–4.0)
MCH: 27.3 pg (ref 26.0–34.0)
MCHC: 33.3 g/dL (ref 30.0–36.0)
MCV: 81.8 fL (ref 80.0–100.0)
Monocytes Absolute: 0.4 10*3/uL (ref 0.1–1.0)
Monocytes Relative: 7 %
Neutro Abs: 2.7 10*3/uL (ref 1.7–7.7)
Neutrophils Relative %: 51 %
Platelet Count: 256 10*3/uL (ref 150–400)
RBC: 3.85 MIL/uL — ABNORMAL LOW (ref 3.87–5.11)
RDW: 14.3 % (ref 11.5–15.5)
WBC Count: 5.3 10*3/uL (ref 4.0–10.5)
nRBC: 0 % (ref 0.0–0.2)

## 2021-03-06 LAB — LACTATE DEHYDROGENASE: LDH: 179 U/L (ref 98–192)

## 2021-03-06 LAB — SEDIMENTATION RATE: Sed Rate: 100 mm/hr — ABNORMAL HIGH (ref 0–22)

## 2021-03-06 IMAGING — DX DG SCOLIOSIS EVAL COMPLETE SPINE 1V
1 series · 3 of 3 positions shown · non-contrast
Comparison: PET-CT [DATE].

CLINICAL DATA: History of scoliosis and pain.

EXAM:
DG SCOLIOSIS EVAL COMPLETE SPINE 1V

[Series 1: whole body ap · 0.14mm/px · 3 of 3 slices shown]
[im 1/3]
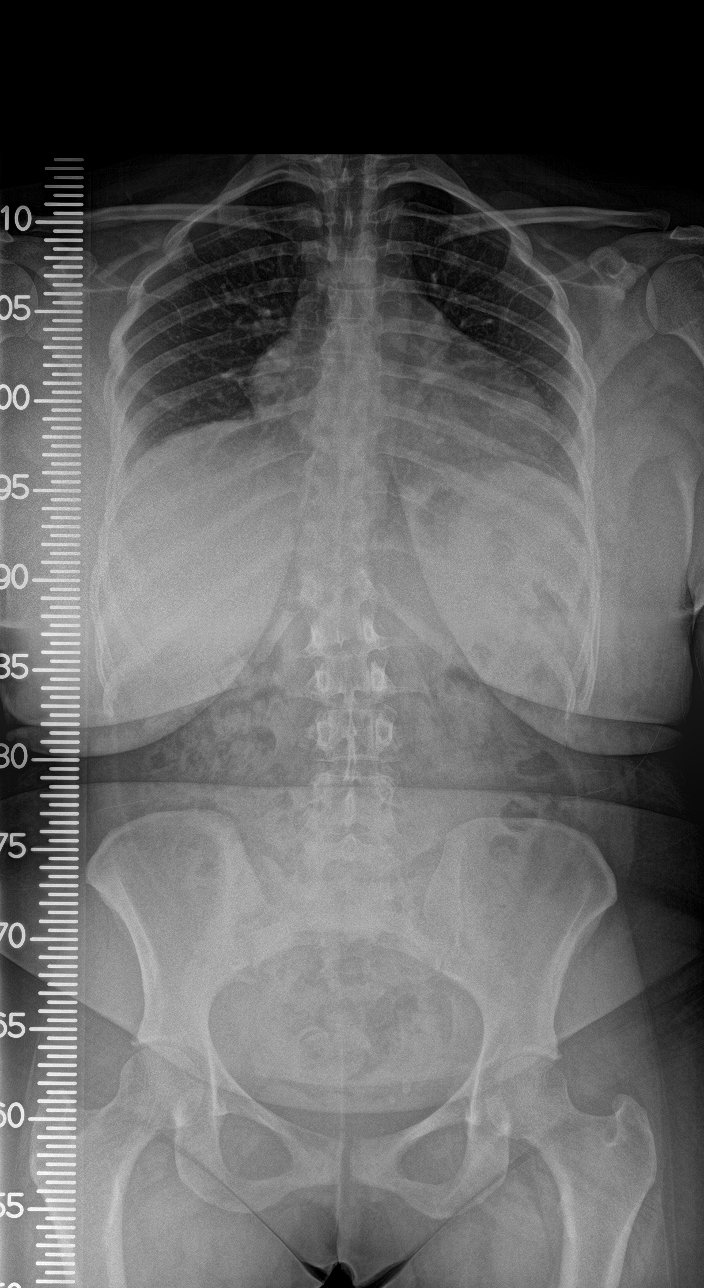
[im 2/3]
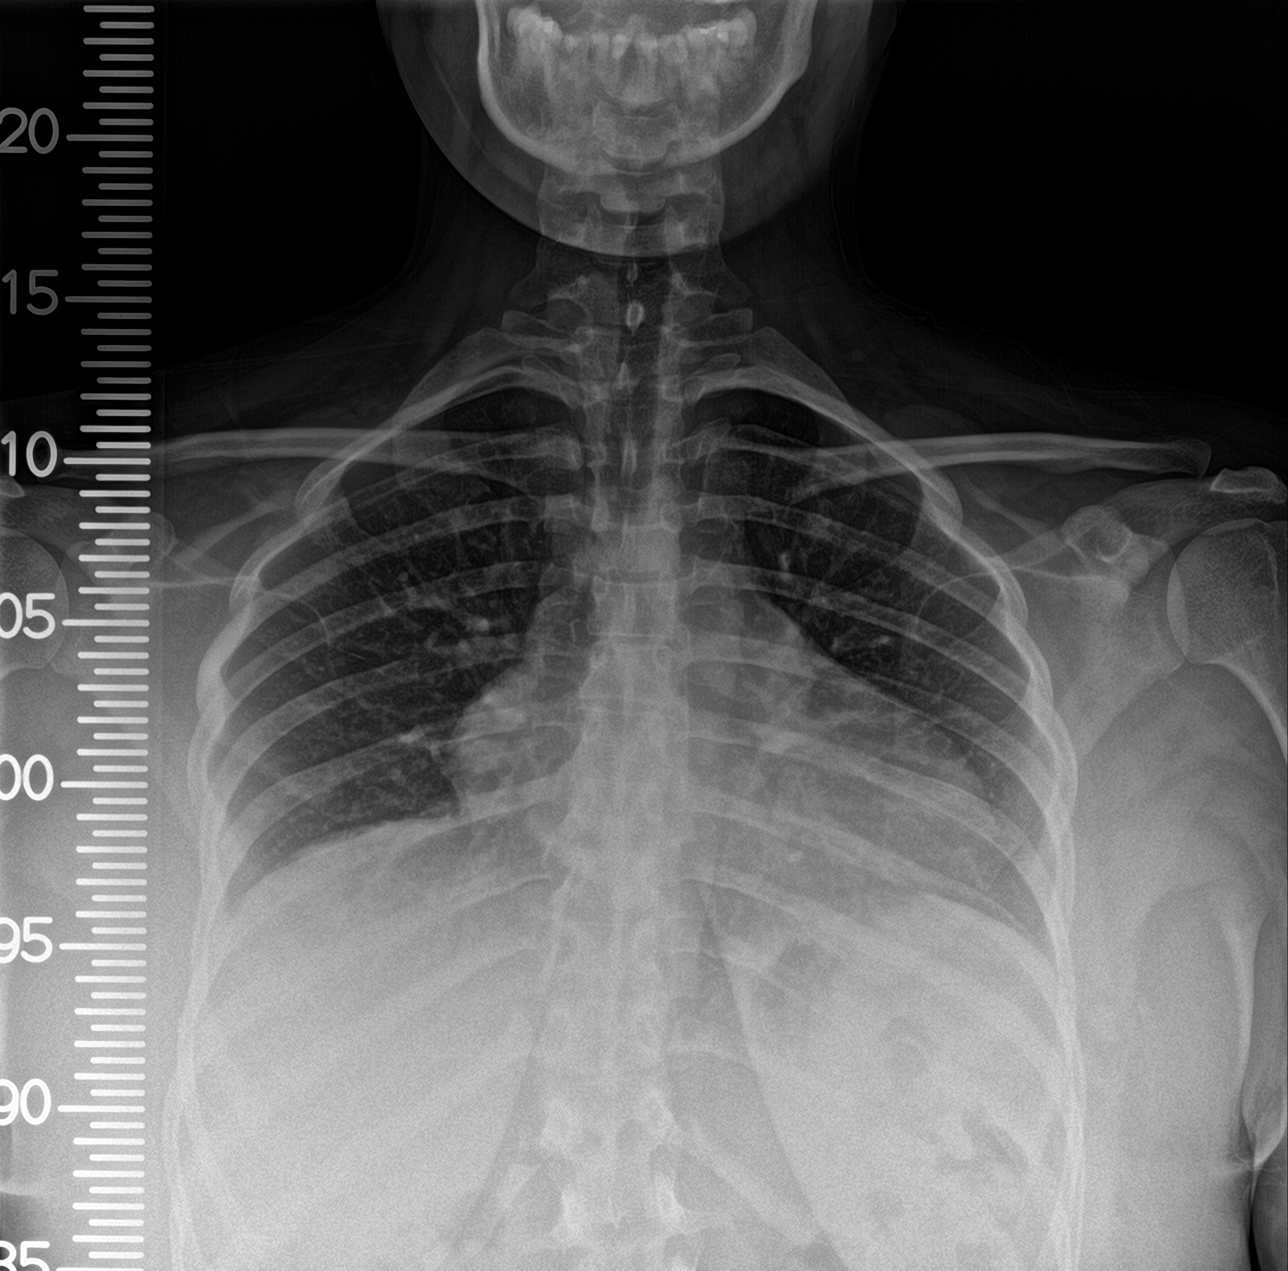
[im 3/3]
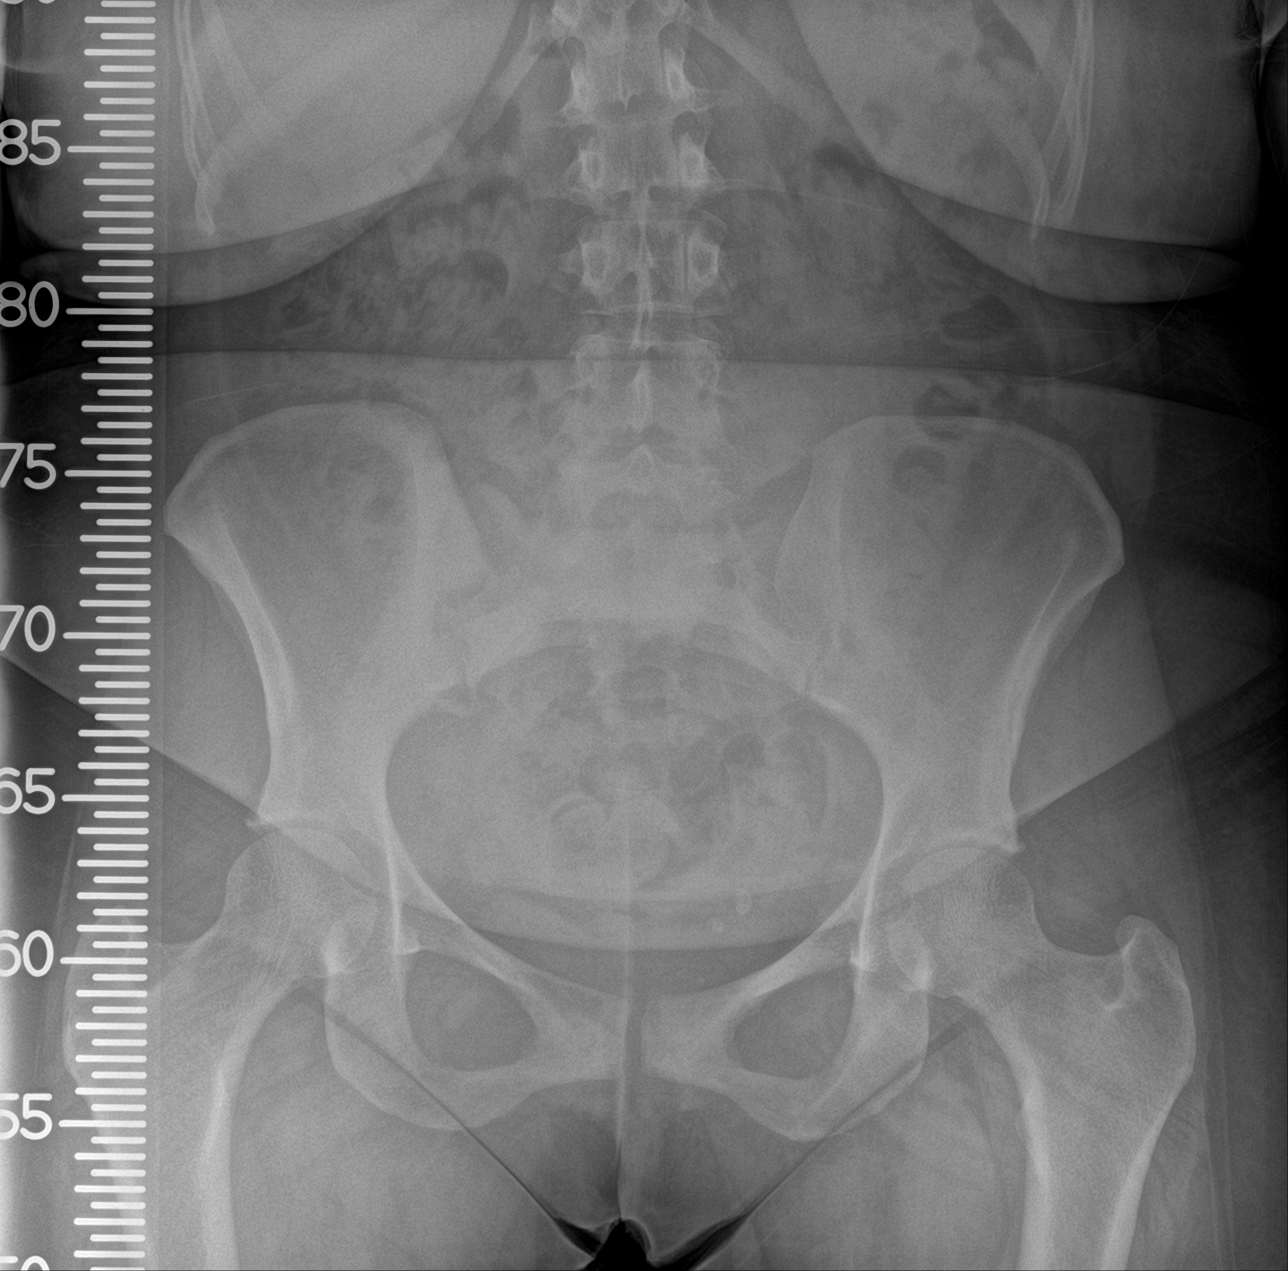

[3 of 3 positions shown; findings below may reference images not displayed]

FINDINGS: Midthoracic spine scoliosis concave left 7 degrees. Mid lumbar spine
scoliosis concave right 5 degrees. Reference is made to prior PET-CT
for discussion of osseous lesions present. These are best identified
by prior PET-CT. No fracture noted. Pelvic calcifications consistent
phleboliths.
IMPRESSION: Midthoracic spine scoliosis concave left 7 degrees. Mid lumbar
scoliosis concave right 5 degrees. Reference is made to prior PET CT
for discussion of osseous lesions present. These are best identified
by prior CT. No fracture noted.

## 2021-03-06 NOTE — Assessment & Plan Note (Signed)
Check xrays  Pt will see a chiropractor  Consider PT vs ortho/ sport med

## 2021-03-06 NOTE — Progress Notes (Signed)
Subjective:   By signing my name below, I, [name], attest that this documentation has been prepared under the direction and in the presence of Ann Held, DO. 03/06/2021    Patient ID: Jodi Norman, female    DOB: September 07, 1995, 25 y.o.   MRN: [id number]  Chief Complaint  Patient presents with   Back Pain    Pt states wanting a referral for her back due to her scoliosis. Pt states no falls or injury recently.      HPI Patient is in today for an office visit.  She reports she has been experiencing lower back pain and cannot stand for a long period of time. She was told she had a mild degree of scoliosis when she was 10. She feels like her spine is curving inward. She feels uncomfortable while driving or sleeping and wanted to buy a brace.   She was recently admitted to the ED for bouts of dizziness on 02/28/2021 but has not had any recent episodes.   Past Medical History:  Diagnosis Date   Generalized anxiety disorder 06/24/2020    No past surgical history on file.  Family History  Problem Relation Age of Onset   Hyperlipidemia Mother    Hypertension Mother    Hyperlipidemia Father    Hypertension Father    Diabetes Father     Social History   Socioeconomic History   Marital status: Single    Spouse name: Not on file   Number of children: Not on file   Years of education: Not on file   Highest education level: Not on file  Occupational History   Not on file  Tobacco Use   Smoking status: Never   Smokeless tobacco: Never  Vaping Use   Vaping Use: Never used  Substance and Sexual Activity   Alcohol use: Never   Drug use: Never   Sexual activity: Not Currently    Birth control/protection: None  Other Topics Concern   Not on file  Social History Narrative   Not on file   Social Determinants of Health   Financial Resource Strain: Not on file  Food Insecurity: Not on file  Transportation Needs: Not on file  Physical Activity: Not on file   Stress: Not on file  Social Connections: Not on file  Intimate Partner Violence: Not on file    Outpatient Medications Prior to Visit  Medication Sig Dispense Refill   amoxicillin (AMOXIL) 500 MG capsule Take 1 capsule (500 mg total) by mouth 2 (two) times daily. 14 capsule 0   clonazePAM (KLONOPIN) 0.5 MG tablet Take 1 tablet (0.5 mg total) by mouth 2 (two) times daily as needed for anxiety. 60 tablet 0   fluconazole (DIFLUCAN) 150 MG tablet Take 1 tablet (150 mg total) by mouth daily. 1 tablet 0   ibuprofen (ADVIL) 400 MG tablet Take 1 tablet (400 mg total) by mouth every 8 (eight) hours as needed for moderate pain. 15 tablet 0   meclizine (ANTIVERT) 25 MG tablet Take 1 tablet (25 mg total) by mouth 2 (two) times daily as needed for dizziness. 20 tablet 0   Vitamin D, Ergocalciferol, (DRISDOL) 1.25 MG (50000 UNIT) CAPS capsule Take 1 capsule (50,000 Units total) by mouth every 7 (seven) days. 12 capsule 0   Cetirizine HCl 10 MG CAPS Take 1 capsule (10 mg total) by mouth daily for 10 days. 10 capsule 0   No facility-administered medications prior to visit.    No Known Allergies  Review of Systems  Constitutional:  Negative for fever.  HENT:  Negative for congestion, ear pain, hearing loss, sinus pain and sore throat.   Eyes:  Negative for blurred vision and pain.  Respiratory:  Negative for cough, sputum production, shortness of breath and wheezing.   Cardiovascular:  Negative for chest pain and palpitations.  Gastrointestinal:  Negative for blood in stool, constipation, diarrhea, nausea and vomiting.  Genitourinary:  Negative for dysuria, frequency, hematuria and urgency.  Musculoskeletal:  Positive for back pain (lower). Negative for falls and myalgias.  Neurological:  Negative for dizziness, sensory change, loss of consciousness, weakness and headaches.  Endo/Heme/Allergies:  Negative for environmental allergies. Does not bruise/bleed easily.  Psychiatric/Behavioral:  Negative  for depression and suicidal ideas. The patient is not nervous/anxious and does not have insomnia.       Objective:    Physical Exam Constitutional:      General: She is not in acute distress.    Appearance: Normal appearance. She is not ill-appearing.  HENT:     Head: Normocephalic and atraumatic.     Right Ear: External ear normal.     Left Ear: External ear normal.  Eyes:     Extraocular Movements: Extraocular movements intact.     Pupils: Pupils are equal, round, and reactive to light.  Cardiovascular:     Rate and Rhythm: Normal rate and regular rhythm.     Pulses: Normal pulses.     Heart sounds: Normal heart sounds. No murmur heard.   No gallop.  Pulmonary:     Effort: Pulmonary effort is normal. No respiratory distress.     Breath sounds: Normal breath sounds. No wheezing, rhonchi or rales.  Abdominal:     General: Bowel sounds are normal. There is no distension.     Palpations: Abdomen is soft. There is no mass.     Tenderness: There is no abdominal tenderness. There is no guarding or rebound.     Hernia: No hernia is present.  Musculoskeletal:        General: Tenderness present. No swelling.     Cervical back: Normal range of motion and neck supple.     Comments: Right shoulder blade slightly elevated   Lymphadenopathy:     Cervical: No cervical adenopathy.  Skin:    General: Skin is warm and dry.  Neurological:     General: No focal deficit present.     Mental Status: She is alert and oriented to person, place, and time.     Motor: No weakness.     Gait: Gait normal.  Psychiatric:        Behavior: Behavior normal.    BP 112/82 (BP Location: Right Arm, Patient Position: Sitting, Cuff Size: Large)   Pulse 75   Temp 98.1 F (36.7 C) (Oral)   Resp 18   Ht 5\' 7"  (1.702 m)   Wt 242 lb 12.8 oz (110.1 kg)   LMP 03/06/2021   SpO2 98%   BMI 38.03 kg/m  Wt Readings from Last 3 Encounters:  03/06/21 242 lb 12.8 oz (110.1 kg)  02/28/21 237 lb (107.5 kg)   02/27/21 237 lb (107.5 kg)    Diabetic Foot Exam - Simple   No data filed    Lab Results  Component Value Date   WBC 5.3 03/06/2021   HGB 10.5 (L) 03/06/2021   HCT 31.5 (L) 03/06/2021   PLT 256 03/06/2021   GLUCOSE 90 03/06/2021   CHOL 140 07/28/2020   TRIG 67.0  07/28/2020   HDL 44.70 07/28/2020   LDLCALC 82 07/28/2020   ALT 9 03/06/2021   AST 14 (L) 03/06/2021   NA 138 03/06/2021   K 4.1 03/06/2021   CL 104 03/06/2021   CREATININE 0.73 03/06/2021   BUN 9 03/06/2021   CO2 27 03/06/2021   TSH 0.66 07/28/2020   INR 1.0 07/22/2020   HGBA1C 5.6 07/11/2020    Lab Results  Component Value Date   TSH 0.66 07/28/2020   Lab Results  Component Value Date   WBC 5.3 03/06/2021   HGB 10.5 (L) 03/06/2021   HCT 31.5 (L) 03/06/2021   MCV 81.8 03/06/2021   PLT 256 03/06/2021   Lab Results  Component Value Date   NA 138 03/06/2021   K 4.1 03/06/2021   CO2 27 03/06/2021   GLUCOSE 90 03/06/2021   BUN 9 03/06/2021   CREATININE 0.73 03/06/2021   BILITOT 0.3 03/06/2021   ALKPHOS 90 03/06/2021   AST 14 (L) 03/06/2021   ALT 9 03/06/2021   PROT 7.3 03/06/2021   ALBUMIN 4.3 03/06/2021   CALCIUM 9.6 03/06/2021   ANIONGAP 7 03/06/2021   GFR 124.74 07/28/2020   Lab Results  Component Value Date   CHOL 140 07/28/2020   Lab Results  Component Value Date   HDL 44.70 07/28/2020   Lab Results  Component Value Date   LDLCALC 82 07/28/2020   Lab Results  Component Value Date   TRIG 67.0 07/28/2020   Lab Results  Component Value Date   CHOLHDL 3 07/28/2020   Lab Results  Component Value Date   HGBA1C 5.6 07/11/2020       Assessment & Plan:   Problem List Items Addressed This Visit       Unprioritized   Chronic midline back pain   Relevant Orders   Ambulatory referral to Chiropractic   History of scoliosis - Primary    Check xrays  Pt will see a chiropractor  Consider PT vs ortho/ sport med      Relevant Orders   DG SCOLIOSIS EVAL COMPLETE SPINE 1  VIEW   Ambulatory referral to Chiropractic    No orders of the defined types were placed in this encounter.   I,[name],acting as a Education administrator for [employer], DO.,have documented all relevant documentation on the behalf of Ann Held, DO,as directed by  Ann Held, DO while in the presence of [place], DO. , personally preformed the services described in this documentation.  All medical record entries made by the scribe were at my direction and in my presence.  I have reviewed the chart and discharge instructions (if applicable) and agree that the record reflects my personal performance and is accurate and complete. 03/06/2021

## 2021-03-06 NOTE — Patient Instructions (Signed)

## 2021-03-06 NOTE — Progress Notes (Signed)
Patient came into the office today for lab work and requested to speak to me.   She states she has had increased lower back pain and pain in joints. She describes some generalized bone pain. She is seeing her PCP today for pain/treatment recommendations. She is worried however based on her previous abnormal scans and wants to know when her next PET scan can be scheduled. Her last one was in May.  Of note, she also mentions that she is unable to make her 10/18 MD visit as she has to work. This appointment cancelled.   Spoke to Dr Marin Olp. He requests that a PET scan be ordered for evaluation of her increase pain, and follow up to previous abnormalities.   Called patient and notified her of the order. It'll have to first be authorized through her insurance, and then centralized scheduling should be calling her.   Oncology Nurse Navigator Documentation  Oncology Nurse Navigator Flowsheets 03/06/2021  Abnormal Finding Date -  Diagnosis Status -  Navigator Follow Up Date: -  Navigator Follow Up Reason: -  Navigation Complete Date: -  Post Navigation: Continue to Follow Patient? 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

## 2021-03-07 ENCOUNTER — Encounter: Payer: Self-pay | Admitting: *Deleted

## 2021-03-07 ENCOUNTER — Ambulatory Visit: Payer: Medicaid Other | Admitting: Family Medicine

## 2021-03-07 ENCOUNTER — Other Ambulatory Visit: Payer: Medicaid Other

## 2021-03-08 ENCOUNTER — Encounter: Payer: Self-pay | Admitting: Family Medicine

## 2021-03-08 ENCOUNTER — Other Ambulatory Visit: Payer: Self-pay

## 2021-03-08 DIAGNOSIS — M419 Scoliosis, unspecified: Secondary | ICD-10-CM

## 2021-03-14 ENCOUNTER — Inpatient Hospital Stay: Payer: Medicaid Other | Admitting: Hematology & Oncology

## 2021-03-16 ENCOUNTER — Ambulatory Visit (INDEPENDENT_AMBULATORY_CARE_PROVIDER_SITE_OTHER): Payer: Medicaid Other | Admitting: Surgery

## 2021-03-16 ENCOUNTER — Encounter: Payer: Self-pay | Admitting: Surgery

## 2021-03-16 ENCOUNTER — Ambulatory Visit (HOSPITAL_COMMUNITY)
Admission: RE | Admit: 2021-03-16 | Discharge: 2021-03-16 | Disposition: A | Discharge status: Home / Self Care | Payer: Medicaid Other | Source: Ambulatory Visit | Attending: Hematology & Oncology | Admitting: Hematology & Oncology

## 2021-03-16 ENCOUNTER — Other Ambulatory Visit: Payer: Self-pay

## 2021-03-16 DIAGNOSIS — Z8739 Personal history of other diseases of the musculoskeletal system and connective tissue: Secondary | ICD-10-CM

## 2021-03-16 DIAGNOSIS — M899 Disorder of bone, unspecified: Secondary | ICD-10-CM | POA: Diagnosis not present

## 2021-03-16 DIAGNOSIS — M546 Pain in thoracic spine: Secondary | ICD-10-CM | POA: Diagnosis not present

## 2021-03-16 DIAGNOSIS — M545 Low back pain, unspecified: Secondary | ICD-10-CM | POA: Diagnosis not present

## 2021-03-16 LAB — GLUCOSE, CAPILLARY: Glucose-Capillary: 106 mg/dL — ABNORMAL HIGH (ref 70–99)

## 2021-03-16 MED ORDER — FLUDEOXYGLUCOSE F - 18 (FDG) INJECTION
12.2000 | Freq: Once | INTRAVENOUS | Status: AC
  Administered 2021-03-16: 12.63 via INTRAVENOUS

## 2021-03-16 NOTE — Progress Notes (Signed)
Office Visit Note   Patient: Jodi Norman           Date of Birth: 06-05-1995           MRN: [id number] Visit Date: 03/16/2021              Requested by: [address] [place], [place] [address] [place],  [place] [address] PCP: Carollee Herter, Alferd Apa, DO   Assessment & Plan: Visit Diagnoses:  1. Back pain of thoracolumbar region   2. Bone lesion (abnormal PET scan)     Plan: The patient's ongoing and worsening thoracolumbar pain and with history of abnormal PET scan showing lesions throughout the spine I will get stat thoracic and lumbar MRI scans.  I will have patient follow-up with Dr. Louanne Skye after completion to discuss results.  She will also follow-up with her oncologist Dr. Marin Olp to discuss results of updated PET scan that was done today.  Patient's primary care provider had recommended going to a chiropractor but I advised her to hold off on doing this until we get the results of her studies back.  At this point I do not want any adjustments or manipulation done on her spine.  Follow-Up Instructions: Return in about 2 weeks (around 03/30/2021) for with dr Louanne Skye to review thoracic and lumbar mri scans (per [facility]).   Orders:  Orders Placed This Encounter  Procedures   MR THORACIC SPINE W WO CONTRAST   MR Lumbar Spine W Wo Contrast   No orders of the defined types were placed in this encounter.     Procedures: No procedures performed   Clinical Data: No additional findings.   Subjective: Chief Complaint  Patient presents with   Lower Back - Pain   Middle Back - Pain    HPI 25 year old black female who is new patient to clinic comes in with complaints of worsening thoracolumbar pain.  Patient states that she has a history of scoliosis since she was a child.  She is had back pain off and on for several years but this has been worse since 2019 after giving birth to her child.  Pain is aggravated with standing, sitting, walking.  Denies lower extremity  radiculopathy.  Denies bowel or bladder incontinence.  Patient has been followed by Dr. Burney Gauze oncologist for abnormal PET scan.  Patient was last seen by Dr. Marin Olp November 15, 2020.  PET scan July 07, 2020 showed:  CLINICAL DATA:  Initial treatment strategy for osseous metastatic disease. No known primary.   EXAM: NUCLEAR MEDICINE PET WHOLE BODY   TECHNIQUE: 10.1 mCi F-18 FDG was injected intravenously. Full-ring PET imaging was performed from the head to foot after the radiotracer. CT data was obtained and used for attenuation correction and anatomic localization.   Fasting blood glucose: 97 mg/dl   COMPARISON:  Chest CT 06/22/2020   FINDINGS: Mediastinal blood pool activity: SUV max 1.83   HEAD/NECK: Diffuse symmetric and fairly marked hypermetabolism noted in the posterior nasopharynx, tonsillar regions and in the submandibular glands. There also numerous borderline neck nodes which are moderately hypermetabolic. SUV max is 6.69. This has the appearance of an acute inflammatory process most likely pharyngitis. COVID Omicron infection can give this appearance on PET-CT. I think it is unlikely this is lymphoma.   Incidental CT findings: none   CHEST: No breast masses, supraclavicular or axillary adenopathy. No mediastinal or hilar mass or adenopathy. No worrisome pulmonary lesions.   Incidental CT findings: none  ABDOMEN/PELVIS: No abnormal hypermetabolic activity within the liver, pancreas, adrenal glands, or spleen. No hypermetabolic lymph nodes in the abdomen or pelvis.   There is a focus of hypermetabolism along the medial aspect of the left ovary. I do not see any definite abnormality on the CT scan. SUV max is 6.38. Pelvic ultrasound may be helpful for further evaluation.   Incidental CT findings: none   SKELETON: Diffuse sclerotic metastatic bone disease as demonstrated on the prior CT scan. Significant disease involving the pelvis with large  areas of sclerosis. SUV max in the left hemipelvis is 4.06.   There are also several scattered small subcutaneous nodules which are weakly hypermetabolic. There is a small lesion involving the anterior abdominal wall at the level of the ASIS which has an SUV max of 9.59.   Incidental CT findings: none   EXTREMITIES: Moderate uptake is noted in the right teres minor muscle which could be related to an injection. Scattered small subcutaneous lesions are noted involving the abdominal wall area. I do not see any other definite extremity lesions.   Incidental CT findings: none   IMPRESSION: 1. 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 No pathologic fracture or spinal canal compromise. 2. No obvious primary neoplastic process is identified. 3. Diffuse hypermetabolism involving the pharynx and tonsils along with hypermetabolic lymph nodes. I think this is most likely an acute inflammatory process possibly COVID. 4. Scattered mildly hypermetabolic subcutaneous nodules, possibly metastatic disease. 5. There is a focus of mild hypermetabolism involving the left ovary. This is much more likely functional than neoplastic. No obvious CT abnormality. Recommend pelvic ultrasound for further evaluation.     Electronically Signed   By: Marijo Sanes M.D.   On: 07/07/2020 10:51  Patient had repeat PET scan done today.  She is scheduled to follow-up with oncologist to discuss results.   Review of Systems No current cardiopulmonary GI GU issues  Objective: Vital Signs: LMP 03/06/2021 (Approximate)   Physical Exam Constitutional:      Appearance: She is obese.  HENT:     Head: Normocephalic.  Pulmonary:     Effort: No respiratory distress.  Musculoskeletal:     Comments: Gait is normal.  Some discomfort with lumbar extension.  Lumbar flexion hands ankles without difficulty.  She has mild to moderate right greater than left thoracolumbar paraspinal tenderness.   Nontender over the SI joints.  Negative logroll bilateral hips.  Negative straight leg raise.  No focal motor deficits.  Neurological:     General: No focal deficit present.     Mental Status: She is alert and oriented to person, place, and time.  Psychiatric:        Mood and Affect: Mood normal.    Ortho Exam  Specialty Comments:  No specialty comments available.  Imaging: No results found.   PMFS History: Patient Active Problem List   Diagnosis Date Noted   History of scoliosis 03/06/2021   Chronic midline back pain 03/06/2021   Bone lesion 10/27/2020   Daytime somnolence 07/11/2020   Other fatigue 16/02/9603   Metabolic syndrome 54/01/8118   History of pre-eclampsia  07/11/2020   Obesity (BMI 30-39.9) 07/11/2020   Insomnia 07/08/2020   Intractable migraine without aura and without status migrainosus 07/08/2020   Chest pain 07/08/2020   Generalized anxiety disorder 06/24/2020   Carpal tunnel syndrome, bilateral 05/07/2018   Cesarean delivery delivered 04/28/2018   Right hand paresthesia 06/19/2017   Past Medical History:  Diagnosis Date   Generalized  anxiety disorder 06/24/2020    Family History  Problem Relation Age of Onset   Hyperlipidemia Mother    Hypertension Mother    Hyperlipidemia Father    Hypertension Father    Diabetes Father     No past surgical history on file. Social History   Occupational History   Not on file  Tobacco Use   Smoking status: Never   Smokeless tobacco: Never  Vaping Use   Vaping Use: Never used  Substance and Sexual Activity   Alcohol use: Never   Drug use: Never   Sexual activity: Not Currently    Birth control/protection: None

## 2021-03-17 ENCOUNTER — Encounter: Payer: Self-pay | Admitting: *Deleted

## 2021-03-17 ENCOUNTER — Telehealth: Payer: Self-pay | Admitting: Surgery

## 2021-03-17 NOTE — Telephone Encounter (Signed)
Patient aware the note is written and is in her MyChart or she can come by the office and pick it up

## 2021-03-17 NOTE — Progress Notes (Signed)
Oncology Nurse Navigator Documentation  Oncology Nurse Navigator Flowsheets 03/17/2021  Abnormal Finding Date -  Diagnosis Status -  Navigator Follow Up Date: -  Navigator Follow Up Reason: -  Navigation Complete Date: -  Post Navigation: Continue to Follow Patient? -  Reason Not Navigating Patient: -  Navigator Location [facility]  Referral Date to RadOnc/MedOnc -  Navigator Encounter Type Scan Review  Telephone -  Patient Visit Type MedOnc  Treatment Phase Other  Barriers/Navigation Needs Anxiety;Coordination of Care;Education;Personal Conflicts  Education -  Interventions None Required  Acuity Level 2-Minimal Needs (1-2 Barriers Identified)  Coordination of Care -  Education Method -  Support Groups/Services Friends and Family  Time Spent with Patient 15

## 2021-03-17 NOTE — Telephone Encounter (Signed)
Patient called asked if she can get a note for her employer stating she was in the office yesterday at an appointment. Patient asked if the note can be emailed to her. Patient asked if she can get the note by Monday.     The email address is [email or web address]     The number to contact patient is [phone]

## 2021-03-18 ENCOUNTER — Other Ambulatory Visit: Payer: Self-pay | Admitting: Hematology & Oncology

## 2021-03-18 DIAGNOSIS — E8881 Metabolic syndrome: Secondary | ICD-10-CM

## 2021-03-20 ENCOUNTER — Telehealth: Payer: Self-pay

## 2021-03-20 ENCOUNTER — Telehealth: Payer: Self-pay | Admitting: *Deleted

## 2021-03-20 ENCOUNTER — Other Ambulatory Visit: Payer: Self-pay

## 2021-03-20 ENCOUNTER — Ambulatory Visit
Admission: RE | Admit: 2021-03-20 | Discharge: 2021-03-20 | Disposition: A | Discharge status: Home / Self Care | Payer: Medicaid Other | Source: Ambulatory Visit | Attending: Surgery | Admitting: Surgery

## 2021-03-20 DIAGNOSIS — M545 Low back pain, unspecified: Secondary | ICD-10-CM

## 2021-03-20 DIAGNOSIS — M546 Pain in thoracic spine: Secondary | ICD-10-CM

## 2021-03-20 DIAGNOSIS — M899 Disorder of bone, unspecified: Secondary | ICD-10-CM

## 2021-03-20 IMAGING — MR MR LUMBAR SPINE WO/W CM
4 of 7 series · 19 of 48 positions shown · IV contrast (20 ml multihance)
Comparison: Prior PET-CT examinations [DATE] and earlier.
Scoliosis radiographs [DATE].

CLINICAL DATA: Back pain of thoracolumbar region. Bone lesion.
Metastatic disease evaluation; worsening back pain. Abnormal PET
scan. Additional history provided: Scoliosis, patient reports back
pain and pressure for 14 years.

EXAM:
MRI LUMBAR SPINE WITHOUT AND WITH CONTRAST
TECHNIQUE: Multiplanar and multiecho pulse sequences of the lumbar spine were
obtained without and with intravenous contrast.
CONTRAST:  20mL MULTIHANCE GADOBENATE DIMEGLUMINE 529 MG/ML IV SOLN

[Series 1: T1 · sagittal · 4.0mm · 0.73mm/px · 4 of 15 slices shown (1 of 2)]
[im 1/15]
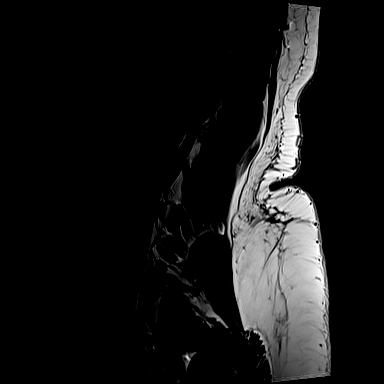
[im 4/15]
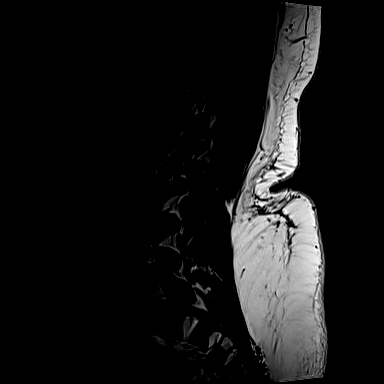
[im 8/15]
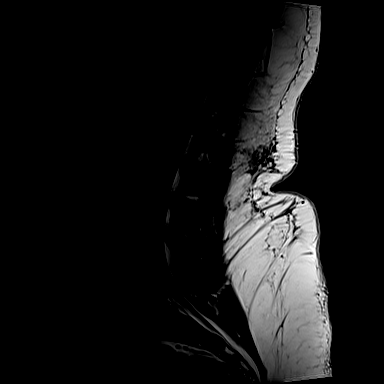
[im 15/15]
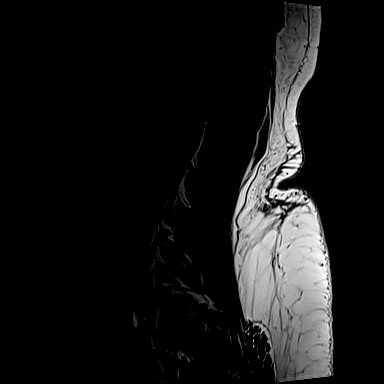

[Series 5: T1 · axial · 4.0mm · 0.28mm/px · z∈[-433,-270]mm · 3 of 38 slices shown (2 of 2)]
[im 5/38]
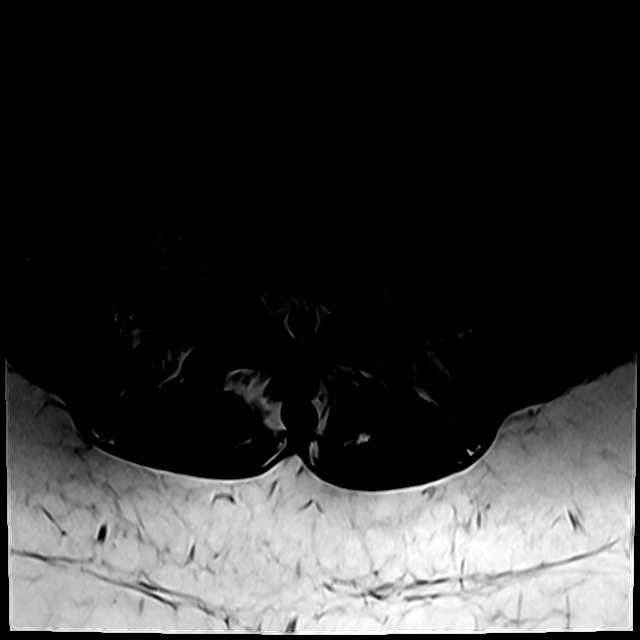
[im 21/38]
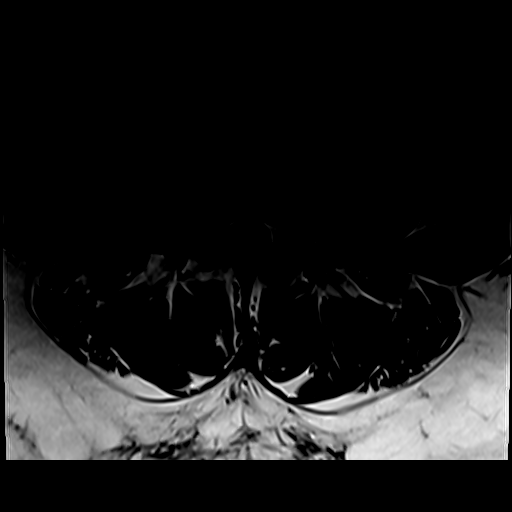
[im 33/38]
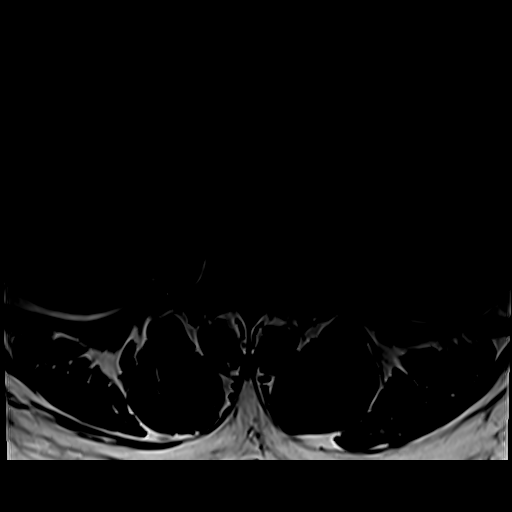

[Series 8: T2 · axial · 4.0mm · 0.28mm/px · z∈[-452,-245]mm · 8 of 38 slices shown (1 of 2)]
[im 1/38]
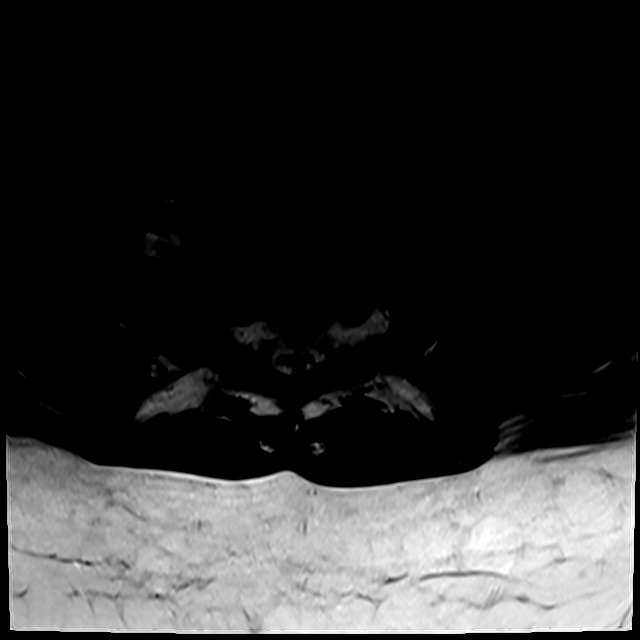
[im 5/38]
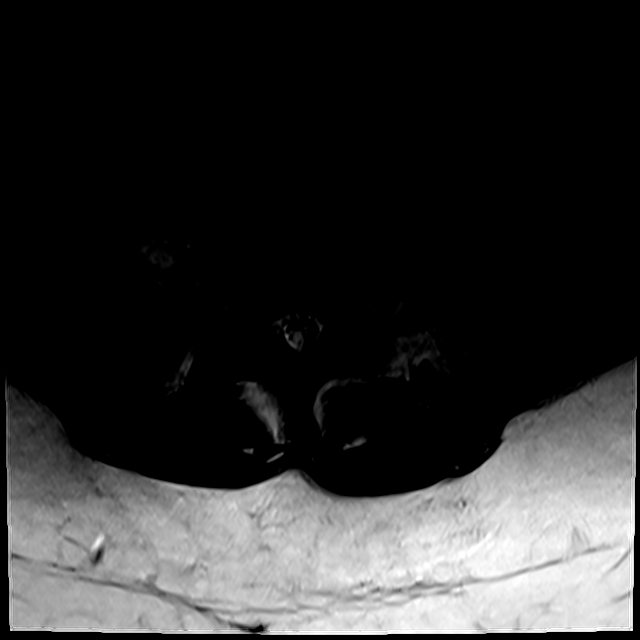
[im 13/38]
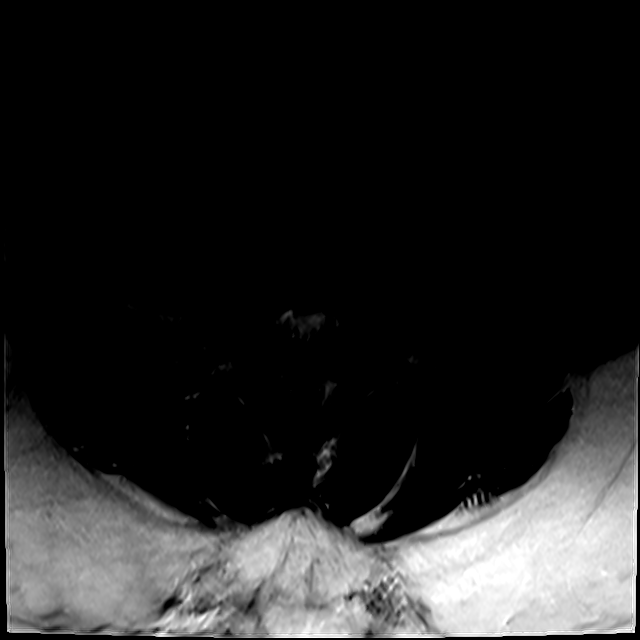
[im 17/38]
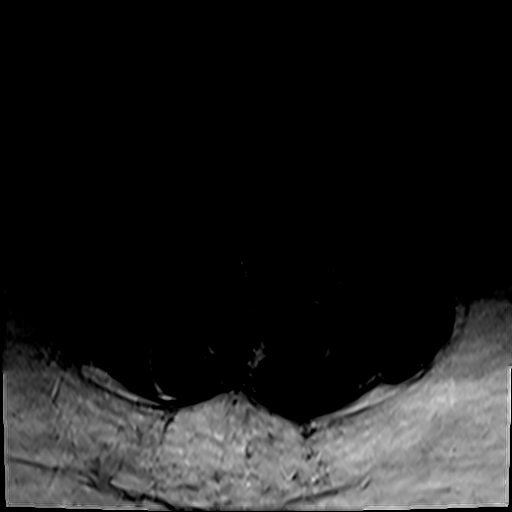
[im 21/38]
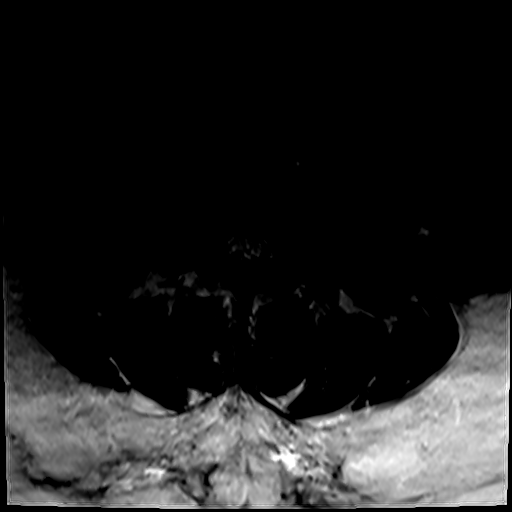
[im 25/38]
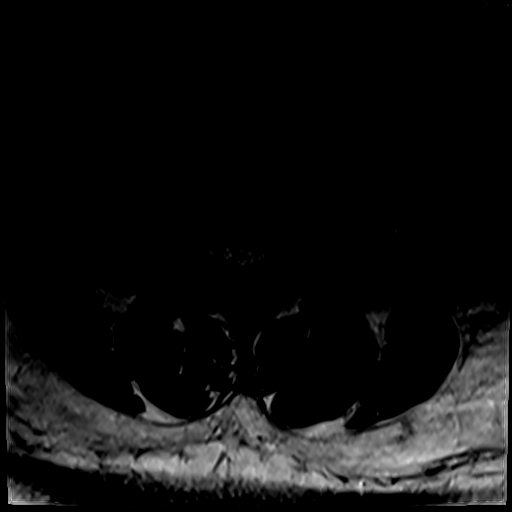
[im 33/38]
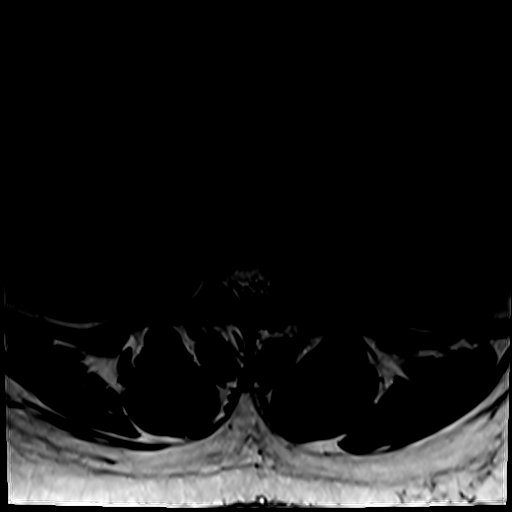
[im 38/38]
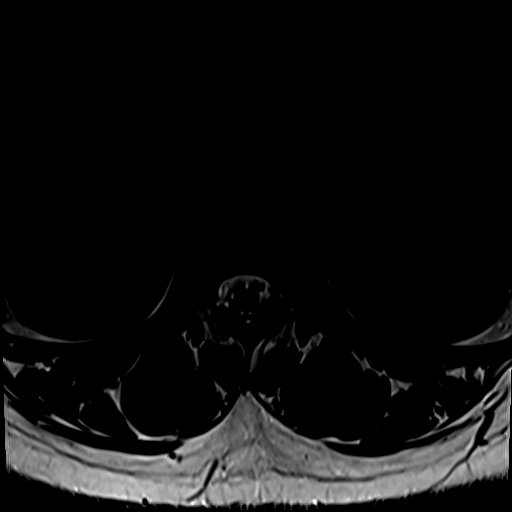

[Series 9: T2 · sagittal · 4.0mm · 0.73mm/px · 4 of 15 slices shown (2 of 2)]
[im 1/15]
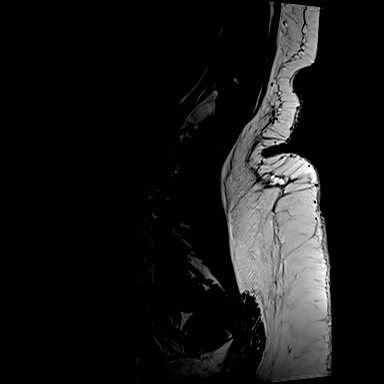
[im 5/15]
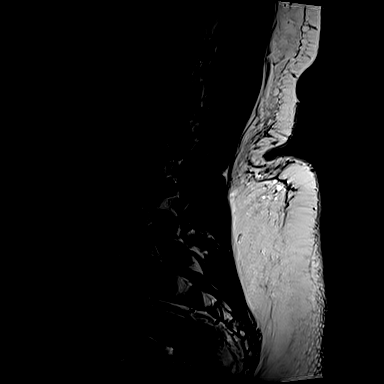
[im 10/15]
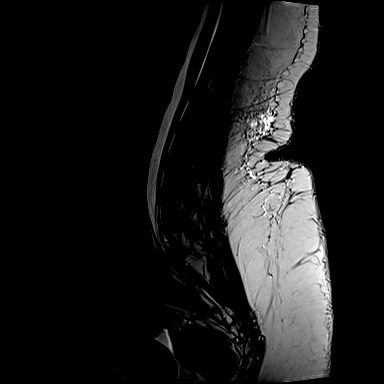
[im 15/15]
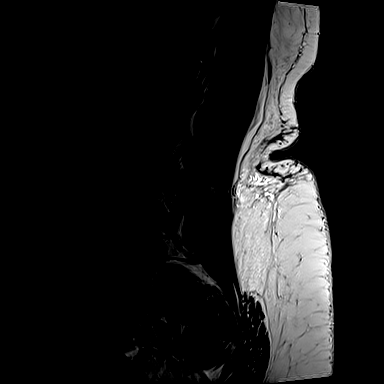

[19 of 48 positions shown; findings below may reference images not displayed]

FINDINGS: Multiple sequences are significantly motion degraded, limiting
evaluation. Most notably, there is severe motion degradation of the
axial T2 TSE sequence.

Segmentation: 5 lumbar vertebrae. The caudal most well-formed
intervertebral disc space is designated L5-S1.

Alignment: Lumbar lumbar levocurvature. No significant
spondylolisthesis.

Vertebrae: Multifocal T1 and T2 hypointense marrow signal
abnormality within the lumbar and sacral spine, as well as
visualized pelvis, corresponding with the sclerotic lesions present
on the recent prior PET-CT of [DATE]. These lesions appear to
demonstrate mild, if any, enhancement (although port fat suppression
on the sagittal T1 weighted postcontrast sequence limits
evaluation).

Conus medullaris and cauda equina: Conus extends to the L2 level. No
signal abnormality within the visualized distal spinal cord. No
appreciable abnormal enhancement of the visualized distal spinal
cord or cauda equina nerve roots.

Paraspinal and other soft tissues: The abdomen and pelvis were
better evaluated on the recent prior PET-CT of [DATE].
Incompletely imaged rounded T2 hyperintense cyst or cystic lesion
within the right pelvis 4.7 cm (for instance as seen on series 10,
image 5).

Disc levels:

Mild disc degeneration at L4-L5. Intervertebral disc height and
hydration are otherwise maintained.

T12-L1: Imaged sagittally. No significant disc herniation or
stenosis.

L1-L2: Minimal facet arthrosis. No significant disc herniation or
stenosis.

L2-L3: Mild facet arthrosis/ligamentum flavum hypertrophy. No
significant disc herniation or stenosis.

L3-L4: Facet arthrosis. No appreciable significant disc herniation,
spinal canal stenosis or neural foraminal narrowing.

L4-L5: Disc bulge. Superimposed broad-based left center/subarticular
disc protrusion. Facet arthrosis. The disc protrusion contributes to
left subarticular narrowing with crowding of the descending left L5
nerve root. Central canal patent. No significant foraminal stenosis.

L5-S1: No appreciable significant disc herniation, spinal canal
stenosis or neural foraminal narrowing.

Impression #7 will be called to the ordering clinician or
representative by the Radiologist Assistant, and communication
documented in the PACS or [REDACTED].
IMPRESSION: Multiple sequences are significantly motion degraded, limiting
evaluation.

Multifocal T1 and T2 hypointense marrow signal abnormality within
the lumbar and sacral spine, as well as visualized pelvis,
corresponding with the sclerotic lesions present on the recent prior
PET-CT of [DATE]. These lesions demonstrate little, if any,
enhancement. While osseous metastatic disease is certainly a
differential consideration, these lesions remain indeterminate in
etiology.

Lumbar spondylosis, as outlined and with findings most notably as
follows.

At L4-L5, there is mild disc degeneration. Disc bulge. Superimposed
broad-based left center/subarticular disc protrusion. Resultant left
subarticular narrowing with some crowding of the descending left L5
nerve root.

No appreciable significant spinal canal stenosis at the remaining
levels. No appreciable foraminal stenosis.

Lumbar levocurvature.

Incompletely imaged right adnexal cyst or cystic lesion measuring at
least 4.7 cm. This is incompletely characterized on the current
examination and a pelvic ultrasound is recommended for further
evaluation.

## 2021-03-20 IMAGING — MR MR THORACIC SPINE WO/W CM
4 of 9 series · 23 of 48 positions shown · IV contrast (multihance)
Comparison: PET-CT [DATE].  CT chest [DATE]

CLINICAL DATA: Metastatic disease evaluation worsening back pain.
abnormal PET scan.

EXAM:
MRI THORACIC WITHOUT AND WITH CONTRAST
TECHNIQUE: Multiplanar and multiecho pulse sequences of the thoracic spine were
obtained without and with intravenous contrast.
CONTRAST:  20mL MULTIHANCE GADOBENATE DIMEGLUMINE 529 MG/ML IV SOLN

[Series 17: T1 · sagittal · 3.0mm · 1.00mm/px · 4 of 16 slices shown (1 of 2)]
[im 1/16]
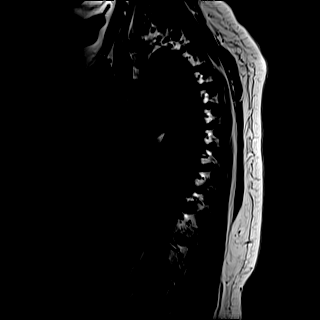
[im 6/16]
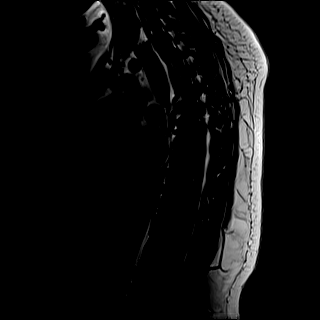
[im 11/16]
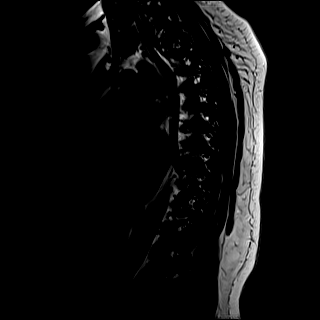
[im 16/16]
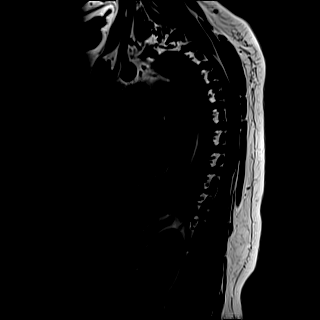

[Series 19: T2 · sagittal · 3.0mm · 1.00mm/px · 3 of 16 slices shown (1 of 2)]
[im 1/16]
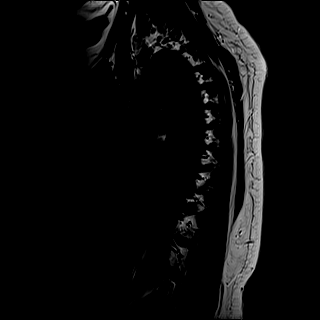
[im 8/16]
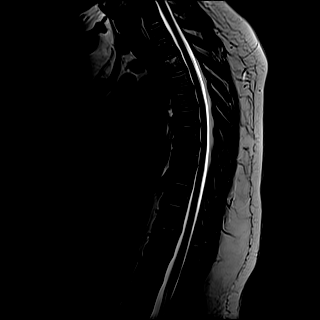
[im 16/16]
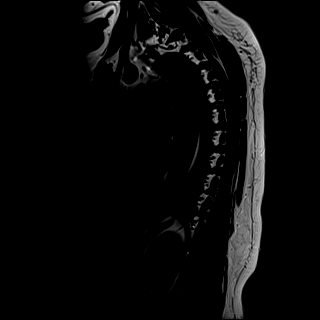

[Series 21: T2 · axial · 4.0mm · 0.28mm/px · z∈[-247,-57]mm · 9 of 43 slices shown (2 of 2)]
[im 1/43]
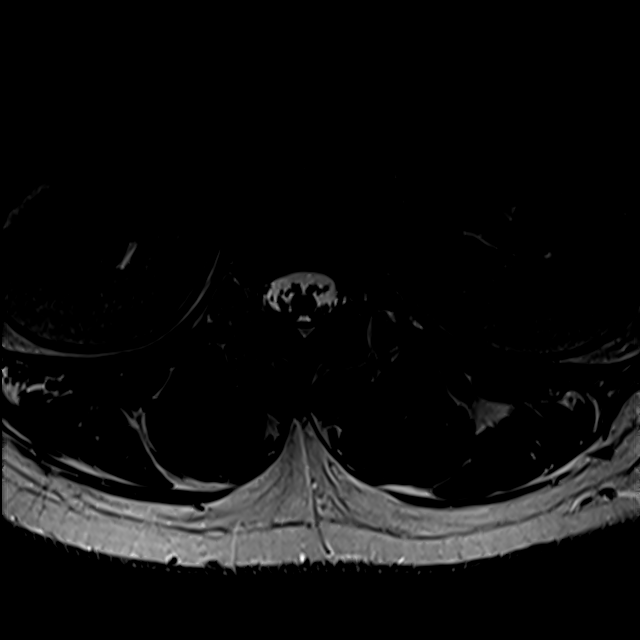
[im 6/43]
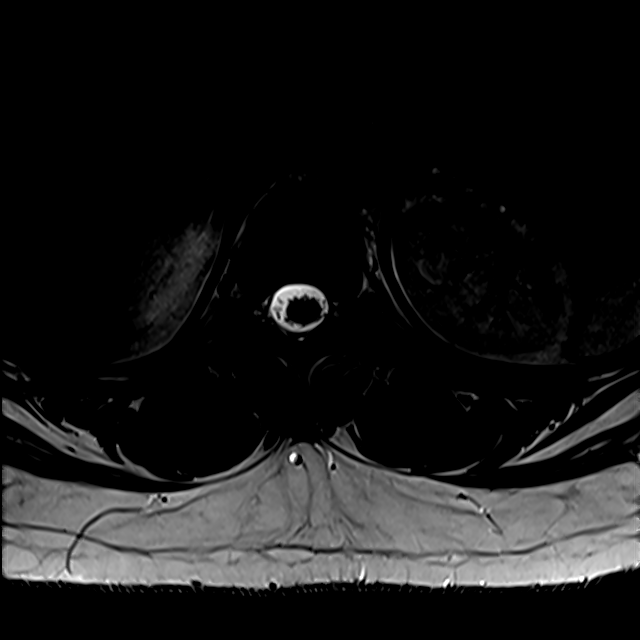
[im 11/43]
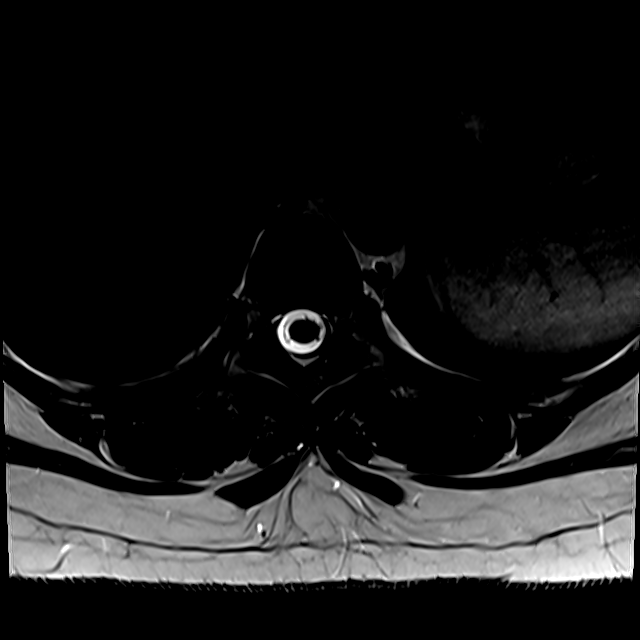
[im 16/43]
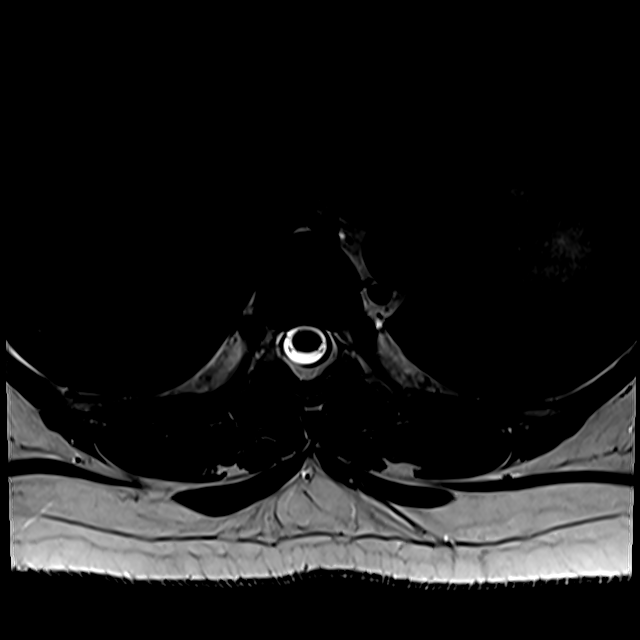
[im 22/43]
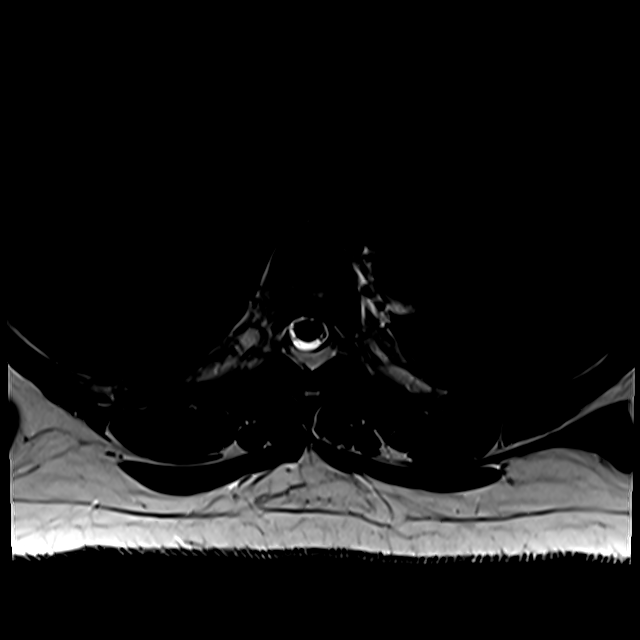
[im 27/43]
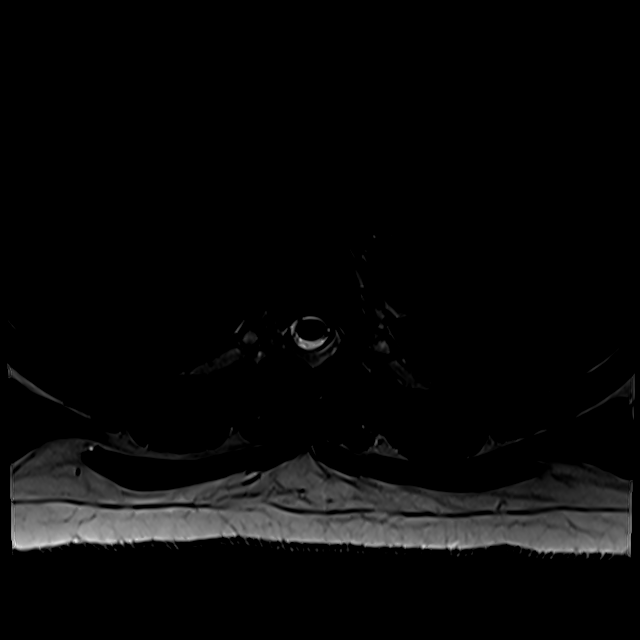
[im 32/43]
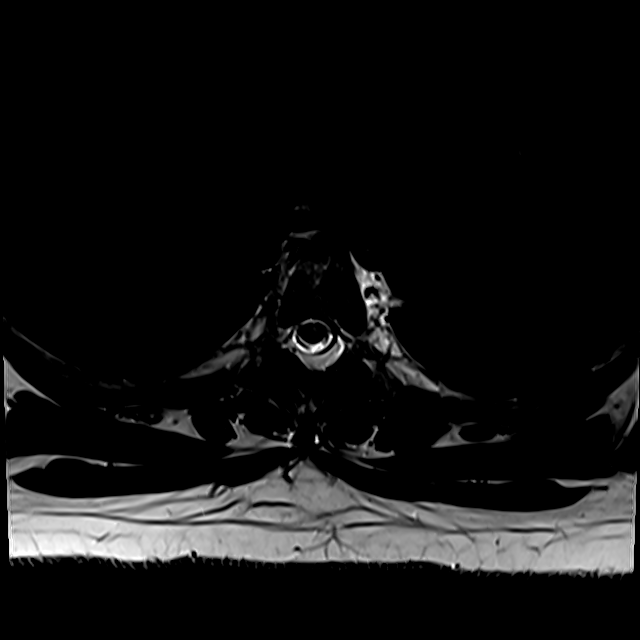
[im 37/43]
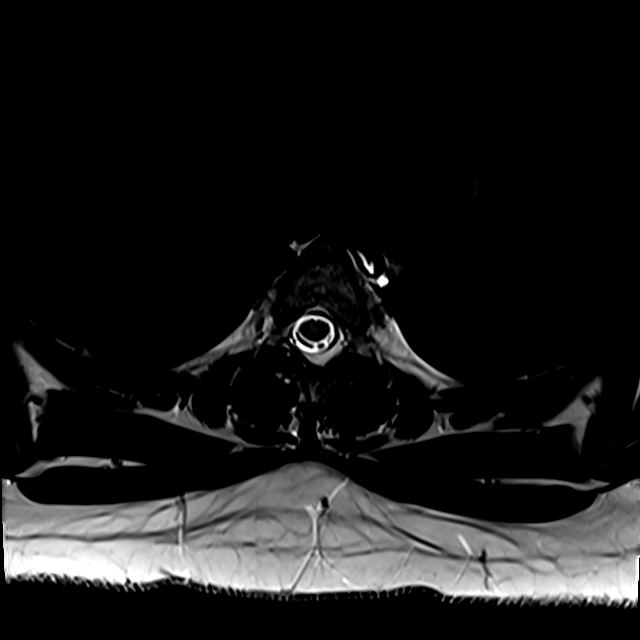
[im 43/43]
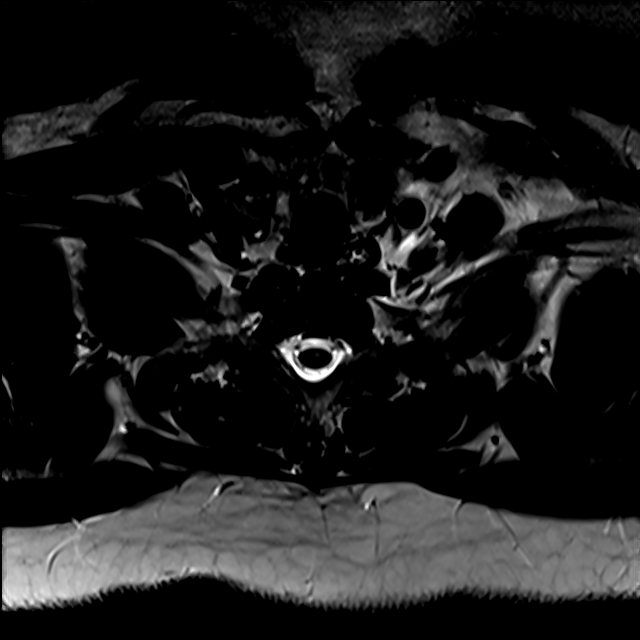

[Series 22: T1 · axial · non-contrast · 4.0mm · 0.56mm/px · z∈[-247,-80]mm · 7 of 43 slices shown (2 of 2)]
[im 1/43]
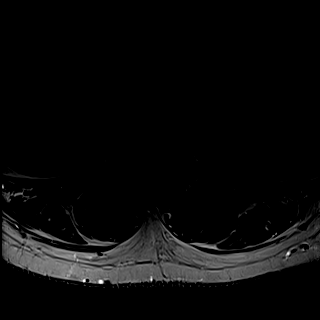
[im 6/43]
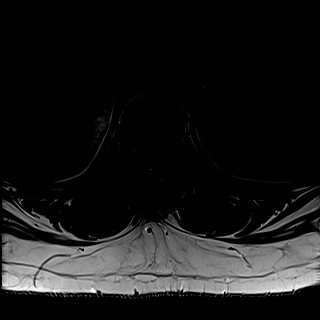
[im 11/43]
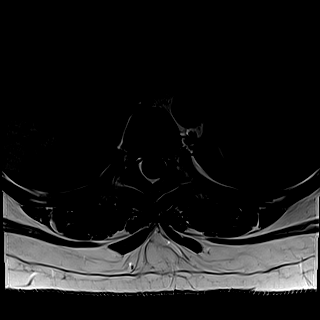
[im 16/43]
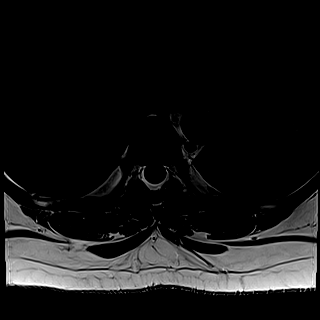
[im 22/43]
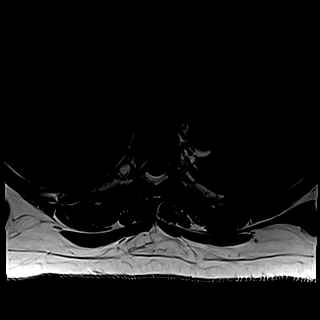
[im 27/43]
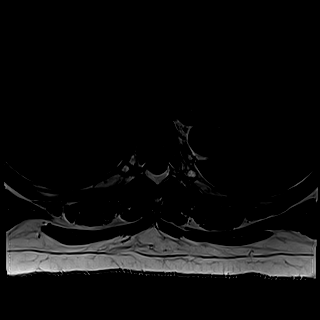
[im 37/43]
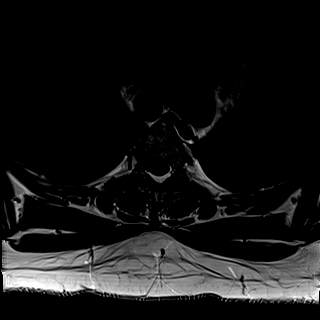

[23 of 48 positions shown; findings below may reference images not displayed]

FINDINGS: Alignment:  No static listhesis.  Normal thoracic kyphosis.

Vertebrae: Multifocal areas of abnormally decreased T1 and T2 bone
marrow signal within the spine, most notably involving the T4, T7,
T9, T12, and L1 vertebrae. On localizer sequence, the C3 and C5
vertebral bodies of the cervical spine are also involved. Lesions
are nonenhancing. No expansile bone lesion. No evidence of a
pathologic fracture. No extraosseous soft tissue component is
visualized. No evidence of discitis.

Cord:  Normal signal and morphology.

Paraspinal and other soft tissues: Partially visualized enlarged
lymph node within the lower right neck measuring 1.3 cm short axis
(series 20, image 3).

Disc levels:

Intervertebral disc heights are preserved. No focal disc protrusion.
Facet joints are unremarkable. No foraminal or canal stenosis at any
level.
IMPRESSION: 1. Multifocal marrow replacing bone lesions throughout the spine,
with involvement of the cervical, thoracic, and visualized lumbar
spines. No expansile bone lesion, extraosseous soft tissue
component, or evidence of a pathologic fracture. Appearance and
distribution is similar to the previous studies without evidence of
interval progression.
2. Lower right cervical lymphadenopathy, as seen on recent PET-CT.

## 2021-03-20 MED ORDER — GADOBENATE DIMEGLUMINE 529 MG/ML IV SOLN
20.0000 mL | Freq: Once | INTRAVENOUS | Status: AC | PRN
  Administered 2021-03-20: 20 mL via INTRAVENOUS

## 2021-03-20 NOTE — Telephone Encounter (Signed)
Volanda Napoleon, MD  P Onc Nurse Hp  I called Ms. Mccamish this morning.  I am talked to her about the PET scan results.  She actually saw the orthopedist yesterday.  She is set up for an MRI of the thoracic and lumbar spine.  The PET scan of the bones certainly did not look any worse.  There appeared to be somewhat less activity.  However, there was some activity in the ovary and endometrium.  There is no activity in a right supraclavicular lymph node.  We will have to get a pelvic ultrasound and a CT of the neck to try to better identify what is going on.   It is certainly is not obvious as to what the problem is.  I just still cannot imagine any malignancy given that is been almost a year since all this started and there really has been no change with respect to her PET scan with respect to her bones.  I still had to believe this is some kind of inflammatory condition.  I do not know if this is sarcoid.  I do not know this is Paget's disease.   I do think that at some point she would probably need another biopsy.   Laurey Arrow

## 2021-03-20 NOTE — Telephone Encounter (Signed)
Please see MRI results for Lumbar and Thoracic Spine.

## 2021-03-21 ENCOUNTER — Ambulatory Visit: Payer: Medicaid Other | Admitting: Family Medicine

## 2021-03-22 ENCOUNTER — Other Ambulatory Visit: Payer: Self-pay

## 2021-03-22 ENCOUNTER — Encounter: Payer: Self-pay | Admitting: Cardiology

## 2021-03-22 ENCOUNTER — Ambulatory Visit (INDEPENDENT_AMBULATORY_CARE_PROVIDER_SITE_OTHER): Payer: Medicaid Other | Admitting: Cardiology

## 2021-03-22 VITALS — BP 120/59 | HR 84 | Ht 67.0 in | Wt 245.6 lb

## 2021-03-22 DIAGNOSIS — R400 Somnolence: Secondary | ICD-10-CM | POA: Diagnosis not present

## 2021-03-22 DIAGNOSIS — E669 Obesity, unspecified: Secondary | ICD-10-CM | POA: Diagnosis not present

## 2021-03-22 DIAGNOSIS — R5383 Other fatigue: Secondary | ICD-10-CM | POA: Diagnosis not present

## 2021-03-22 NOTE — Progress Notes (Addendum)
Cardiology Office Note:    Date:  03/23/2021   ID:  Jodi Norman, DOB [birth year], MRN [id number]  PCP:  Carollee Herter, Alferd Apa, DO  Cardiologist:  Berniece Salines, DO  Electrophysiologist:  None   Referring MD: Carollee Herter, Alferd Apa, *   " I am still fatigue and short of breath"  History of Present Illness:    Jodi Norman is a 25 y.o. female with a hx of anxiety, preeclampsia, obesity, I did see the patient back in February 2021 at that time she reported recently diagnosed bone cancer and was pending biopsy.  During that time she reported she was experiencing shortness of breath sharp pain palpitations as well as daytime somnolence.  At that time I recommended the patient undergo a echocardiogram, wear a monitor as well as get a sleep study.  Unfortunately she did not follow through with the monitor as well as the echocardiogram or the sleep study.  Of note She followed with heme/onc at that time and had a positive  PET scan lesions on her bones.  We actually did do a biopsy of a bone lesion.  This was back in February.  The biopsy showed normocellular marrow.   Her PCP has asked her to be reevaluated.  And at this time the patient is only interested in moving forward with the sleep study.    Past Medical History:  Diagnosis Date   Generalized anxiety disorder 06/24/2020    No past surgical history on file.  Current Medications: Current Meds  Medication Sig   amitriptyline (ELAVIL) 10 MG tablet Take 10 mg by mouth at bedtime as needed.   Cetirizine HCl 10 MG CAPS Take 1 capsule (10 mg total) by mouth daily for 10 days.   clonazePAM (KLONOPIN) 0.5 MG tablet Take 1 tablet (0.5 mg total) by mouth 2 (two) times daily as needed for anxiety.   fluconazole (DIFLUCAN) 150 MG tablet Take 1 tablet (150 mg total) by mouth daily.   ibuprofen (ADVIL) 400 MG tablet Take 1 tablet (400 mg total) by mouth every 8 (eight) hours as needed for moderate pain.   meclizine (ANTIVERT) 25 MG tablet Take 1  tablet (25 mg total) by mouth 2 (two) times daily as needed for dizziness.   Vitamin D, Ergocalciferol, (DRISDOL) 1.25 MG (50000 UNIT) CAPS capsule Take 1 capsule (50,000 Units total) by mouth every 7 (seven) days.     Allergies:   Patient has no known allergies.   Social History   Socioeconomic History   Marital status: Single    Spouse name: Not on file   Number of children: Not on file   Years of education: Not on file   Highest education level: Not on file  Occupational History   Not on file  Tobacco Use   Smoking status: Never   Smokeless tobacco: Never  Vaping Use   Vaping Use: Never used  Substance and Sexual Activity   Alcohol use: Never   Drug use: Never   Sexual activity: Not Currently    Birth control/protection: None  Other Topics Concern   Not on file  Social History Narrative   Not on file   Social Determinants of Health   Financial Resource Strain: Not on file  Food Insecurity: Not on file  Transportation Needs: Not on file  Physical Activity: Not on file  Stress: Not on file  Social Connections: Not on file     Family History: The patient's family history includes Diabetes in her father;  Hyperlipidemia in her father and mother; Hypertension in her father and mother.  ROS:   Review of Systems  Constitution: Negative for decreased appetite, fever and weight gain.  HENT: Negative for congestion, ear discharge, hoarse voice and sore throat.   Eyes: Negative for discharge, redness, vision loss in right eye and visual halos.  Cardiovascular: Negative for chest pain, dyspnea on exertion, leg swelling, orthopnea and palpitations.  Respiratory: Negative for cough, hemoptysis, shortness of breath and snoring.   Endocrine: Negative for heat intolerance and polyphagia.  Hematologic/Lymphatic: Negative for bleeding problem. Does not bruise/bleed easily.  Skin: Negative for flushing, nail changes, rash and suspicious lesions.  Musculoskeletal: Negative for  arthritis, joint pain, muscle cramps, myalgias, neck pain and stiffness.  Gastrointestinal: Negative for abdominal pain, bowel incontinence, diarrhea and excessive appetite.  Genitourinary: Negative for decreased libido, genital sores and incomplete emptying.  Neurological: Negative for brief paralysis, focal weakness, headaches and loss of balance.  Psychiatric/Behavioral: Negative for altered mental status, depression and suicidal ideas.  Allergic/Immunologic: Negative for HIV exposure and persistent infections.    EKGs/Labs/Other Studies Reviewed:    The following studies were reviewed today:   EKG: None today Recent Labs: 07/28/2020: TSH 0.66 03/06/2021: ALT 9; BUN 9; Creatinine, Ser 0.73; Hemoglobin 10.5; Platelet Count 256; Potassium 4.1; Sodium 138  Recent Lipid Panel    Component Value Date/Time   CHOL 140 07/28/2020 0905   TRIG 67.0 07/28/2020 0905   HDL 44.70 07/28/2020 0905   CHOLHDL 3 07/28/2020 0905   VLDL 13.4 07/28/2020 0905   LDLCALC 82 07/28/2020 0905    Physical Exam:    VS:  BP (!) 120/59   Pulse 84   Ht 5\' 7"  (1.702 m)   Wt 245 lb 9.6 oz (111.4 kg)   LMP 03/06/2021 (Approximate)   SpO2 96%   BMI 38.47 kg/m     Wt Readings from Last 3 Encounters:  03/22/21 245 lb 9.6 oz (111.4 kg)  03/06/21 242 lb 12.8 oz (110.1 kg)  02/28/21 237 lb (107.5 kg)     GEN: Well nourished, well developed in no acute distress HEENT: Normal NECK: No JVD; No carotid bruits LYMPHATICS: No lymphadenopathy CARDIAC: S1S2 noted,RRR, no murmurs, rubs, gallops RESPIRATORY:  Clear to auscultation without rales, wheezing or rhonchi  ABDOMEN: Soft, non-tender, non-distended, +bowel sounds, no guarding. EXTREMITIES: No edema, No cyanosis, no clubbing MUSCULOSKELETAL:  No deformity  SKIN: Warm and dry NEUROLOGIC:  Alert and oriented x 3, non-focal PSYCHIATRIC:  Normal affect, good insight  ASSESSMENT:    1. Daytime somnolence   2. Obesity (BMI 30-39.9)   3. Other fatigue     PLAN:    She still is experiencing fatigue and shortness of breath unfortunately she did not get the echocardiogram and at this time she is interested in moving forward with Sleep study.  We will order the sleep study.  Ideally she should really get an echocardiogram.  We talked about weight loss program.  She will think about the medical weight management program.  [place] [facility] know if she is interested.   The patient is in agreement with the above plan. The patient left the office in stable condition.  The patient will follow up as needed   Medication Adjustments/Labs and Tests Ordered: Current medicines are reviewed at length with the patient today.  Concerns regarding medicines are outlined above.  Orders Placed This Encounter  Procedures   Amb Ref to Medical Weight Management   Split night study   No  orders of the defined types were placed in this encounter.   Patient Instructions  Medication Instructions:  Your physician recommends that you continue on your current medications as directed. Please refer to the Current Medication list given to you today.  *If you need a refill on your cardiac medications before your next appointment, please call your pharmacy*   Lab Work: None If you have labs (blood work) drawn today and your tests are completely normal, you will receive your results only by: [place] (if you have MyChart) OR A paper copy in the mail If you have any lab test that is abnormal or we need to change your treatment, we will call you to review the results.   Testing/Procedures: Your physician has recommended that you have a sleep study. This test records several body functions during sleep, including: brain activity, eye movement, oxygen and carbon dioxide blood levels, heart rate and rhythm, breathing rate and rhythm, the flow of air through your mouth and nose, snoring, body muscle movements, and chest and belly movement.     Follow-Up: At  [facility], you and your health needs are our priority.  As part of our continuing mission to provide you with exceptional heart care, we have created designated Provider Care Teams.  These Care Teams include your primary Cardiologist (physician) and Advanced Practice Providers (APPs -  Physician Assistants and Nurse Practitioners) who all work together to provide you with the care you need, when you need it.  We recommend signing up for the patient portal called "MyChart".  Sign up information is provided on this After Visit Summary.  MyChart is used to connect with patients for Virtual Visits (Telemedicine).  Patients are able to view lab/test results, encounter notes, upcoming appointments, etc.  Non-urgent messages can be sent to your provider as well.   To learn more about what you can do with MyChart, go to [email or web address].    Your next appointment:    As needed  The format for your next appointment:   In Person  Provider:   Berniece Salines, DO [address], [place], [place] [address]    Other Instructions     Adopting a Healthy Lifestyle.  Know what a healthy weight is for you (roughly BMI <25) and aim to maintain this   Aim for 7+ servings of fruits and vegetables daily   65-80+ fluid ounces of water or unsweet tea for healthy kidneys   Limit to max 1 drink of alcohol per day; avoid smoking/tobacco   Limit animal fats in diet for cholesterol and heart health - choose grass fed whenever available   Avoid highly processed foods, and foods high in saturated/trans fats   Aim for low stress - take time to unwind and care for your mental health   Aim for 150 min of moderate intensity exercise weekly for heart health, and weights twice weekly for bone health   Aim for 7-9 hours of sleep daily   When it comes to diets, agreement about the perfect plan isnt easy to find, even among the experts. Experts at the [employer] developed an idea  known as the Healthy Eating Plate. Just imagine a plate divided into logical, healthy portions.   The emphasis is on diet quality:   Load up on vegetables and fruits - one-half of your plate: Aim for color and variety, and remember that potatoes dont count.   Go for whole grains - one-quarter of your plate: Whole wheat,  barley, wheat berries, quinoa, oats, brown rice, and foods made with them. If you want pasta, go with whole wheat pasta.   Protein power - one-quarter of your plate: Fish, chicken, beans, and nuts are all healthy, versatile protein sources. Limit red meat.   The diet, however, does go beyond the plate, offering a few other suggestions.   Use healthy plant oils, such as olive, canola, soy, corn, sunflower and peanut. Check the labels, and avoid partially hydrogenated oil, which have unhealthy trans fats.   If youre thirsty, drink water. Coffee and tea are good in moderation, but skip sugary drinks and limit milk and dairy products to one or two daily servings.   The type of carbohydrate in the diet is more important than the amount. Some sources of carbohydrates, such as vegetables, fruits, whole grains, and beans-are healthier than others.   Finally, stay active  Signed, Berniece Salines, DO  03/23/2021 7:30 AM    [place]

## 2021-03-22 NOTE — Progress Notes (Signed)
ep

## 2021-03-22 NOTE — Patient Instructions (Addendum)
Medication Instructions:  Your physician recommends that you continue on your current medications as directed. Please refer to the Current Medication list given to you today.  *If you need a refill on your cardiac medications before your next appointment, please call your pharmacy*   Lab Work: None If you have labs (blood work) drawn today and your tests are completely normal, you will receive your results only by: [place] (if you have MyChart) OR A paper copy in the mail If you have any lab test that is abnormal or we need to change your treatment, we will call you to review the results.   Testing/Procedures: Your physician has recommended that you have a sleep study. This test records several body functions during sleep, including: brain activity, eye movement, oxygen and carbon dioxide blood levels, heart rate and rhythm, breathing rate and rhythm, the flow of air through your mouth and nose, snoring, body muscle movements, and chest and belly movement.     Follow-Up: At [facility], you and your health needs are our priority.  As part of our continuing mission to provide you with exceptional heart care, we have created designated Provider Care Teams.  These Care Teams include your primary Cardiologist (physician) and Advanced Practice Providers (APPs -  Physician Assistants and Nurse Practitioners) who all work together to provide you with the care you need, when you need it.  We recommend signing up for the patient portal called "MyChart".  Sign up information is provided on this After Visit Summary.  MyChart is used to connect with patients for Virtual Visits (Telemedicine).  Patients are able to view lab/test results, encounter notes, upcoming appointments, etc.  Non-urgent messages can be sent to your provider as well.   To learn more about what you can do with MyChart, go to [email or web address].    Your next appointment:    As needed  The format for your next  appointment:   In Person  Provider:   Berniece Salines, DO [address], [place], [place] [address]    Other Instructions

## 2021-03-23 ENCOUNTER — Telehealth: Payer: Self-pay

## 2021-03-23 ENCOUNTER — Ambulatory Visit (HOSPITAL_BASED_OUTPATIENT_CLINIC_OR_DEPARTMENT_OTHER)
Admission: RE | Admit: 2021-03-23 | Discharge: 2021-03-23 | Disposition: A | Discharge status: Home / Self Care | Payer: Medicaid Other | Source: Ambulatory Visit | Attending: Hematology & Oncology | Admitting: Hematology & Oncology

## 2021-03-23 DIAGNOSIS — E8881 Metabolic syndrome: Secondary | ICD-10-CM | POA: Insufficient documentation

## 2021-03-23 IMAGING — US US PELVIS COMPLETE WITH TRANSVAGINAL
1 series · 14 of 25 positions shown · non-contrast
Comparison: PET CT [DATE]

CLINICAL DATA: Abnormality on PET CT

EXAM:
TRANSABDOMINAL AND TRANSVAGINAL ULTRASOUND OF PELVIS
TECHNIQUE: Both transabdominal and transvaginal ultrasound examinations of the
pelvis were performed. Transabdominal technique was performed for
global imaging of the pelvis including uterus, ovaries, adnexal
regions, and pelvic cul-de-sac. It was necessary to proceed with
endovaginal exam following the transabdominal exam to visualize the
uterus endometrium ovaries.

[Series 1: us pelvis complete with transvaginal · 14 of 76 slices shown]
[im 1/76]
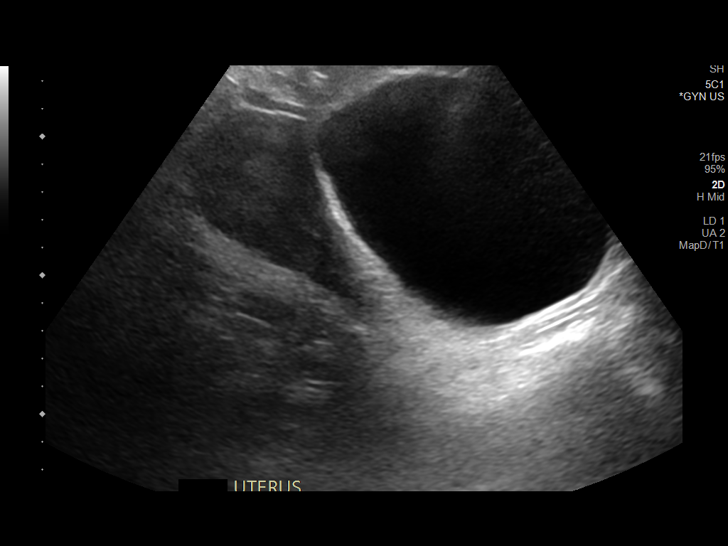
[im 7/76]
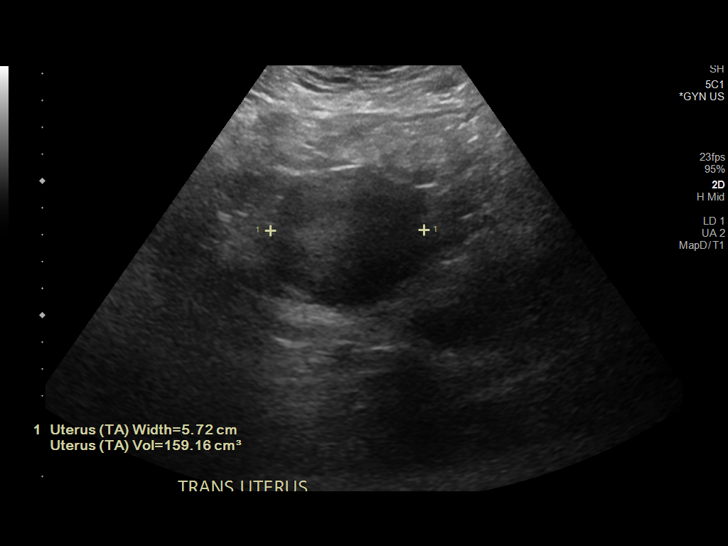
[im 13/76]
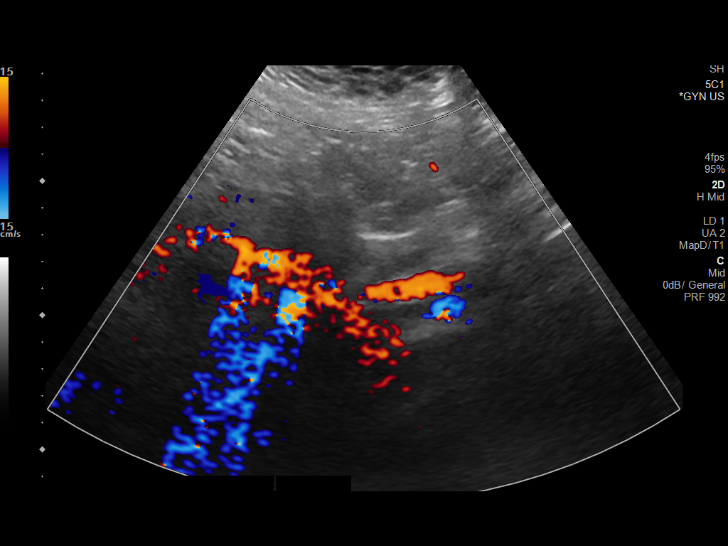
[im 19/76]
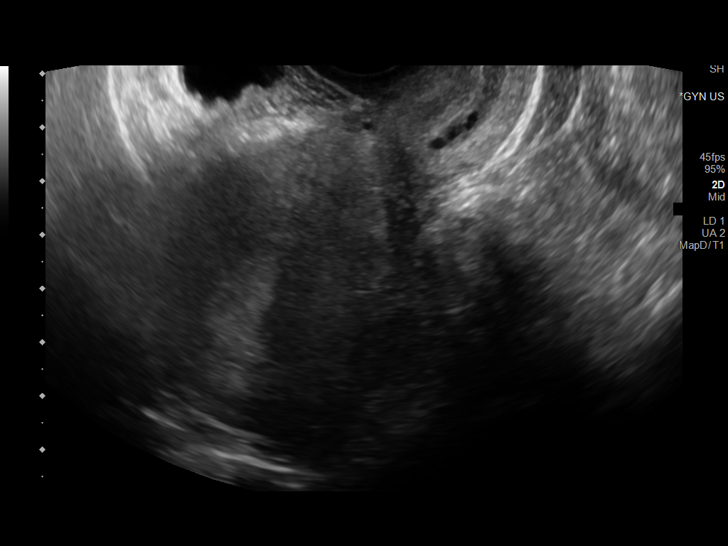
[im 26/76]
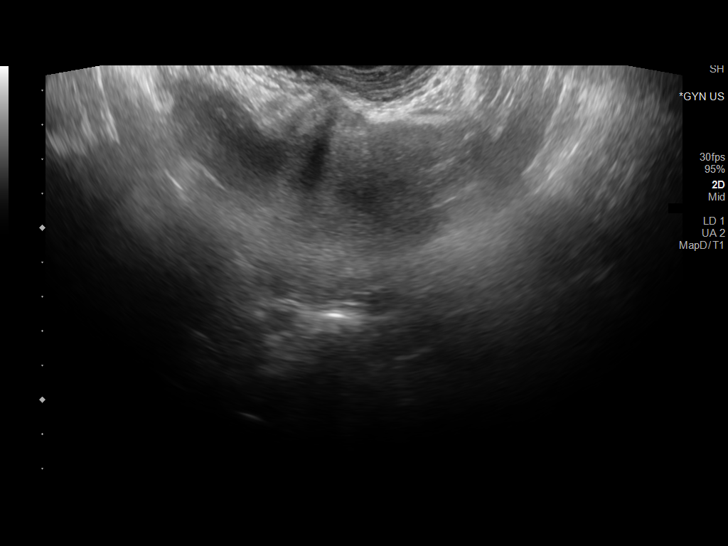
[im 29/76]
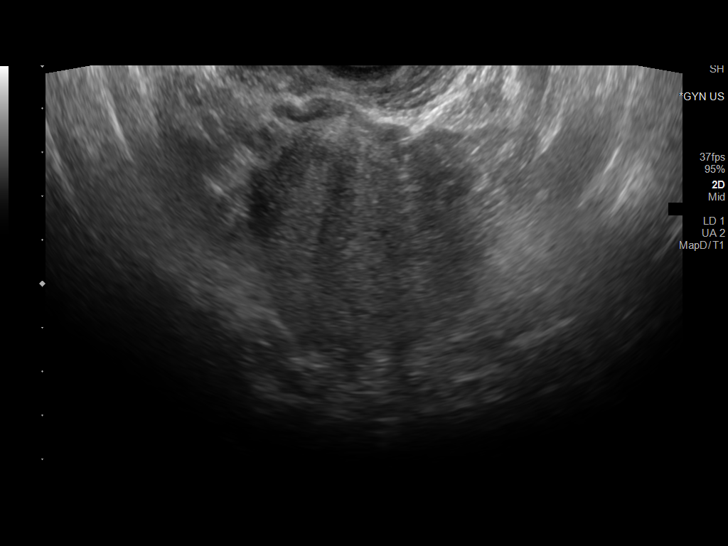
[im 35/76]
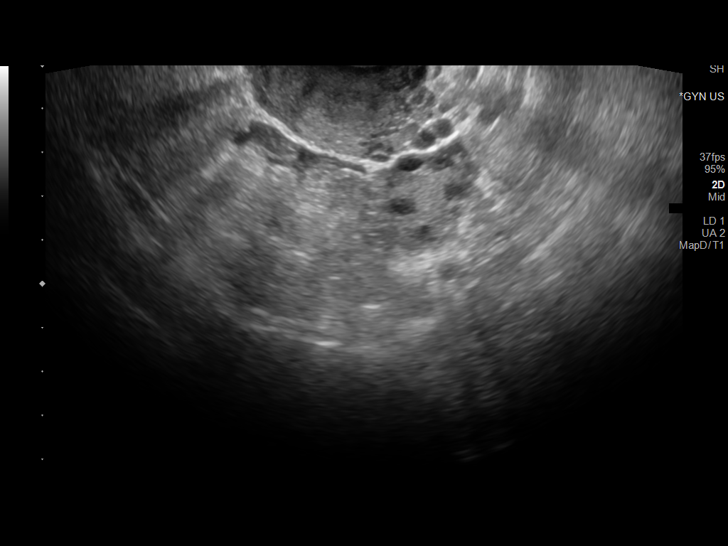
[im 41/76]
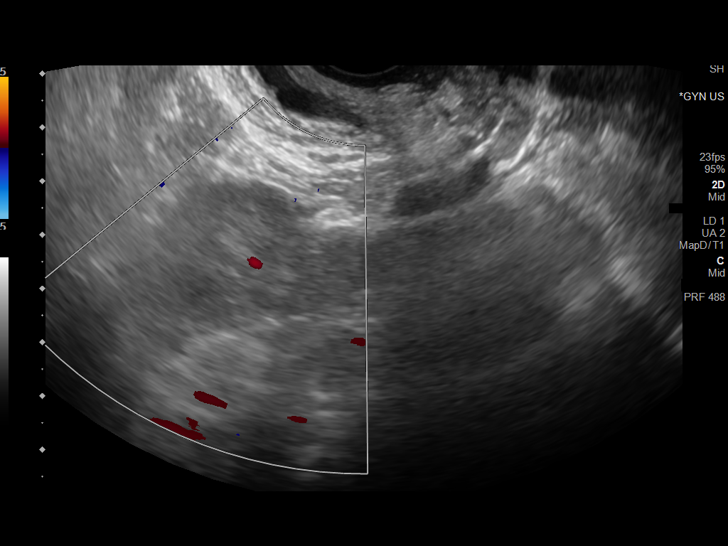
[im 47/76]
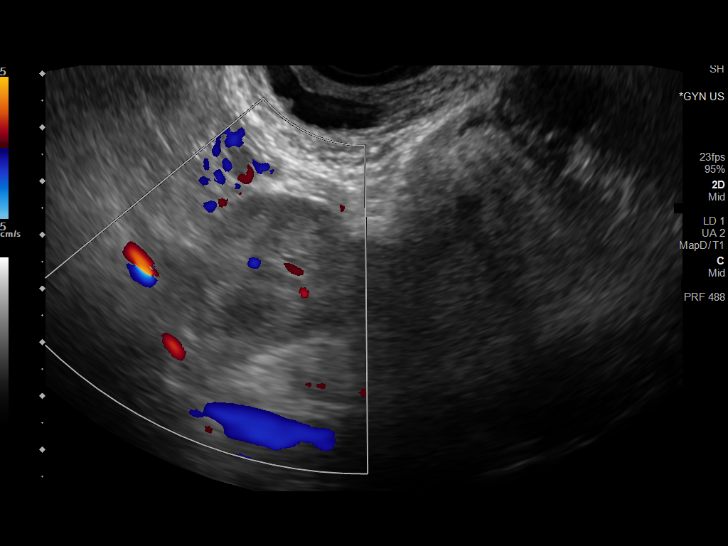
[im 51/76]
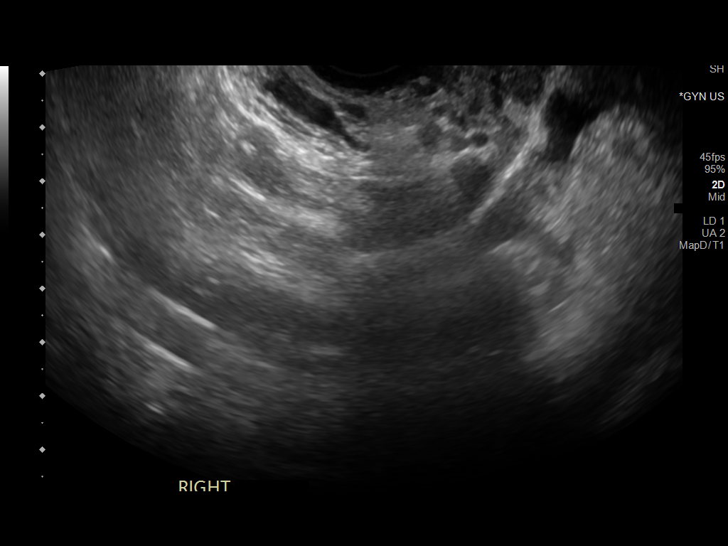
[im 57/76]
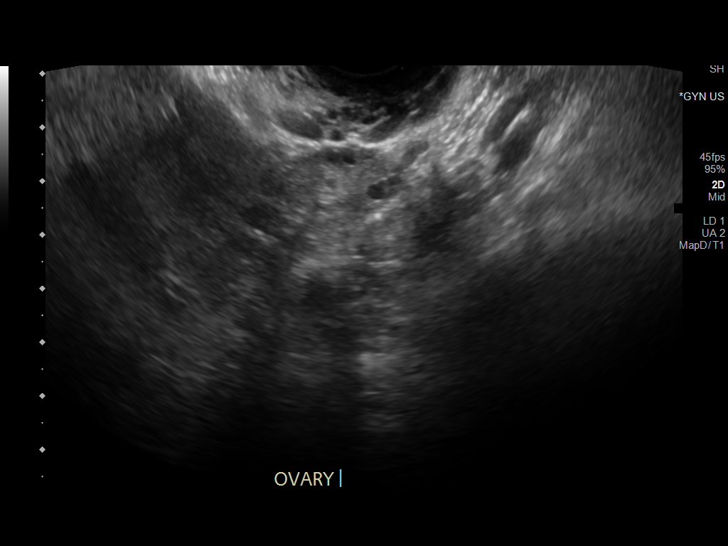
[im 63/76]
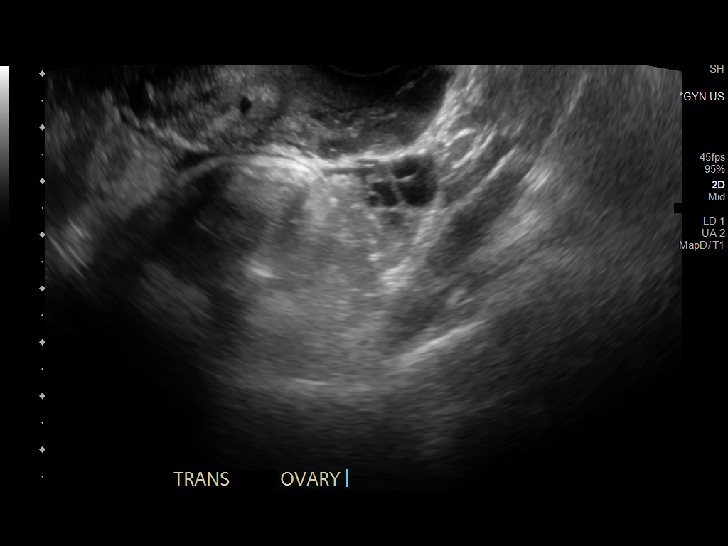
[im 69/76]
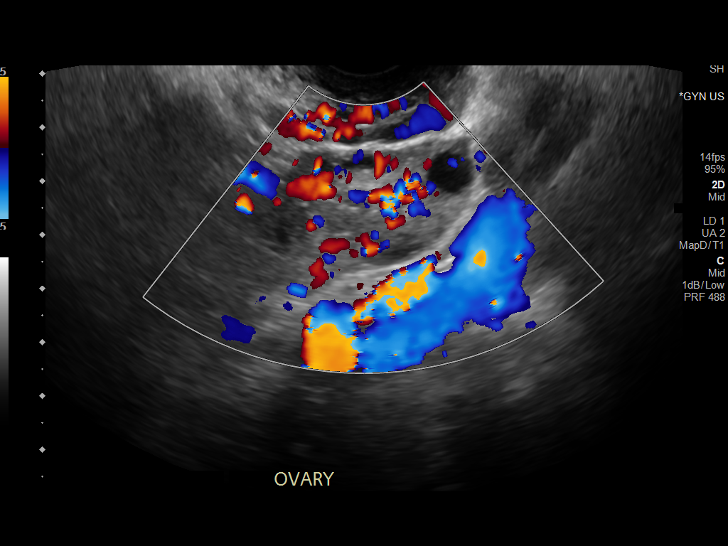
[im 76/76]
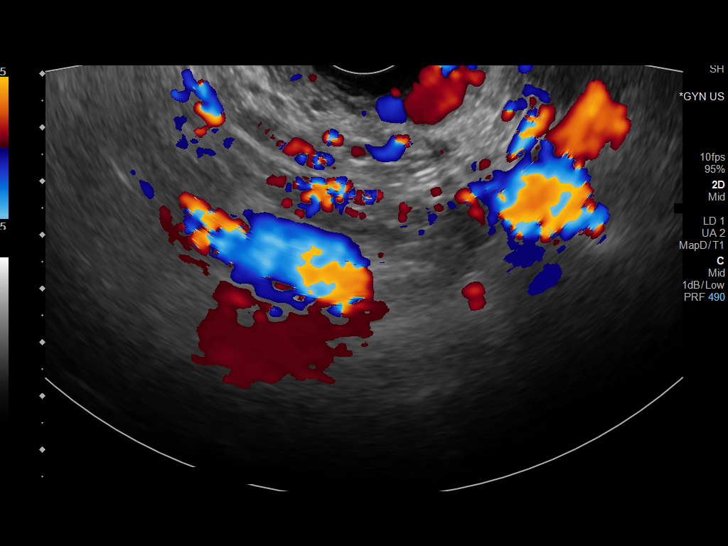

[14 of 25 positions shown; findings below may reference images not displayed]

FINDINGS: Uterus

Measurements: 8.9 x 3.9 x 4.6 cm = volume: 83 mL. No fibroids or
other mass visualized.

Endometrium

Thickness: 9.5 mm.  No focal abnormality visualized.

Right ovary

Measurements: 3 x 3 x 2.8 cm = volume: 13 1 mL. Normal appearance/no
adnexal mass.

Left ovary

Measurements: 4.4 x 2.6 x 2.1 cm = volume: 12.7 mL. Normal
appearance/no adnexal mass.

Other findings

Trace free fluid in the pelvis
IMPRESSION: Trace free fluid in the pelvis.  Otherwise negative examination

## 2021-03-23 IMAGING — CT CT NECK W/ CM
4 series · 15 of 33 positions shown, 18 images · IV contrast (Omnipaque)
Comparison: PET-CT [DATE]

CLINICAL DATA: Lymphadenopathy.  History of malignancy.

EXAM:
CT NECK WITH CONTRAST
TECHNIQUE: Multidetector CT imaging of the neck was performed using the
standard protocol following the bolus administration of intravenous
contrast.
CONTRAST:  75mL OMNIPAQUE IOHEXOL 300 MG/ML  SOLN

[Series 3: axial neck · axial · 0.58mm/px · z∈[+1074,+1236]mm · 5 of 123 slices shown, 7 images]
[im 21/123  soft-tissue]
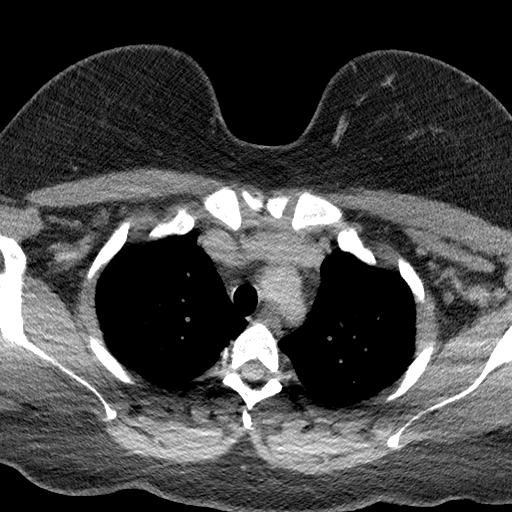
[im 21/123  bone]
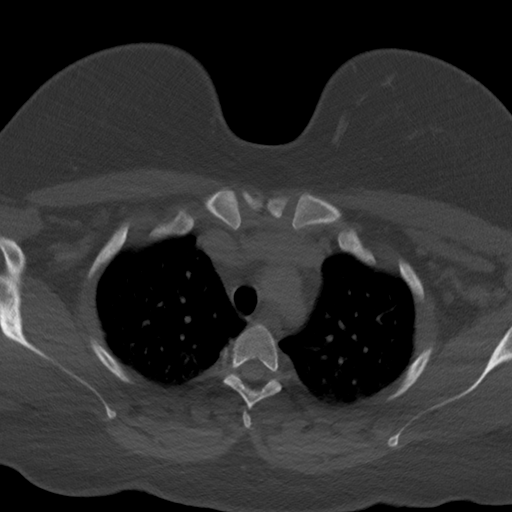
[im 41/123  bone]
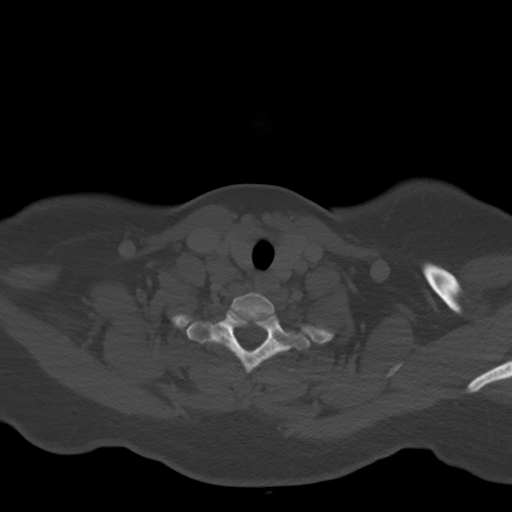
[im 62/123  bone]
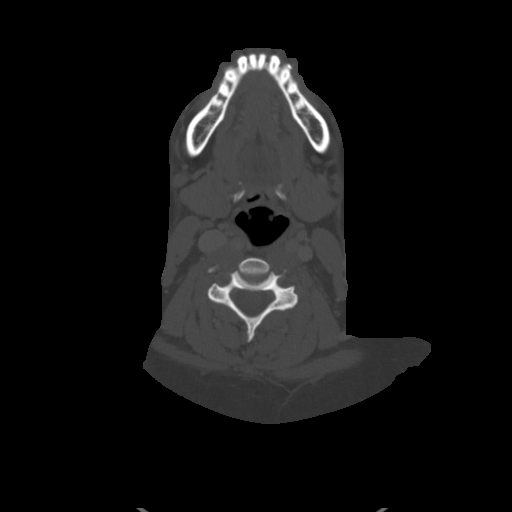
[im 82/123  bone]
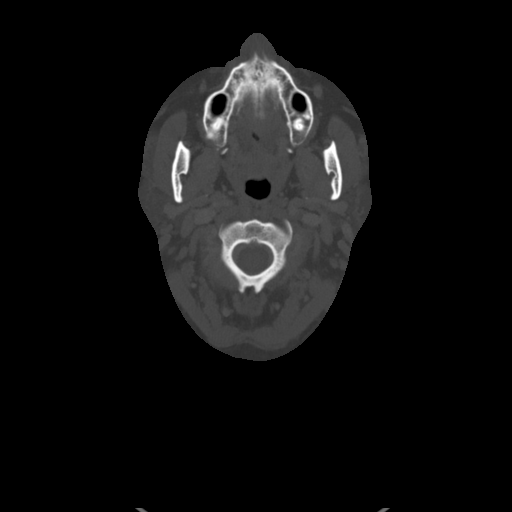
[im 102/123  soft-tissue]
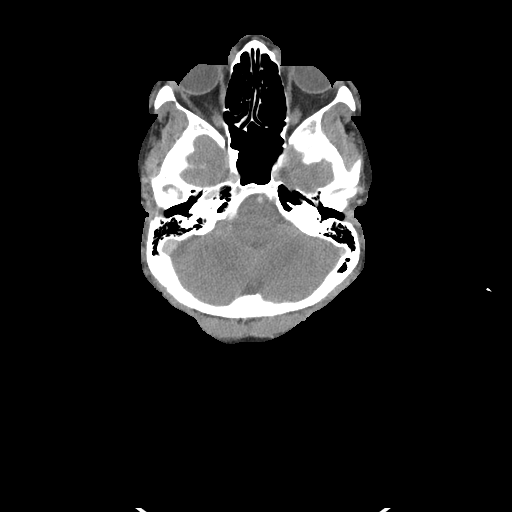
[im 102/123  bone]
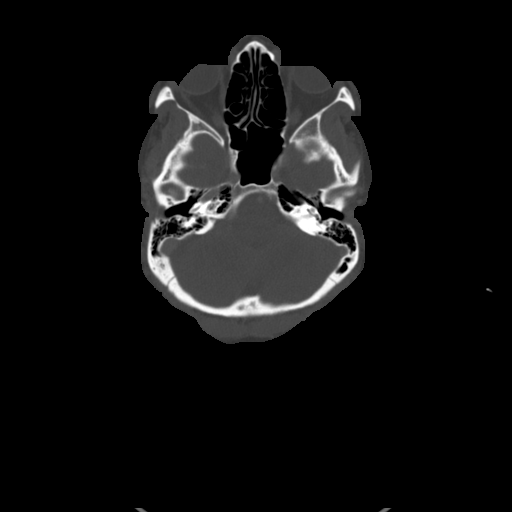

[Series 4: sag neck · sagittal · 0.48mm/px · 5 of 101 slices shown, 6 images]
[im 34/101  bone]
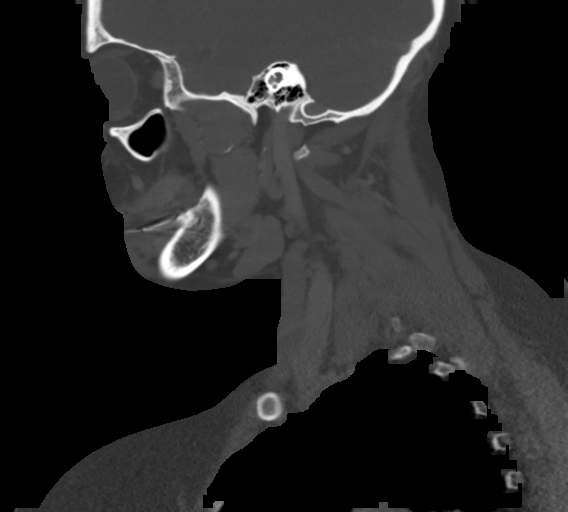
[im 42/101  bone]
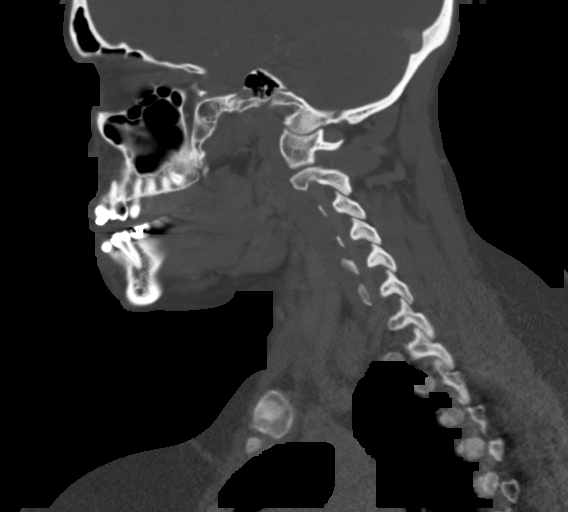
[im 51/101  soft-tissue]
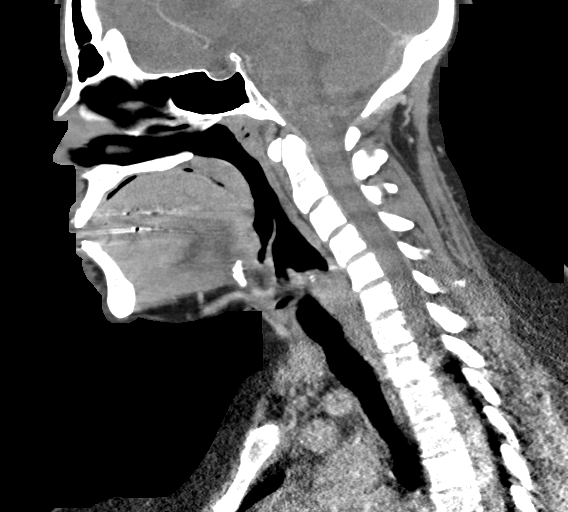
[im 51/101  bone]
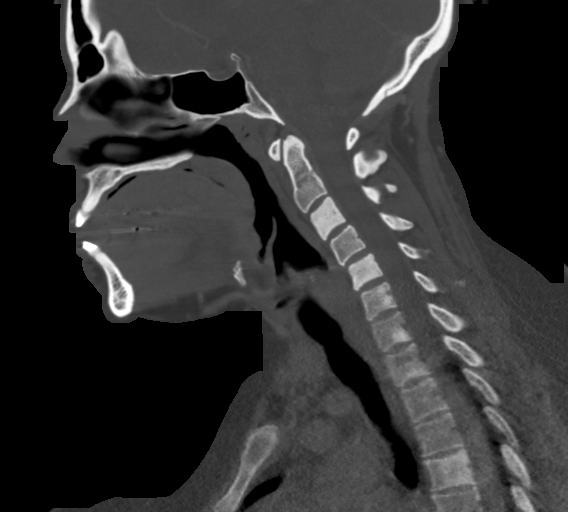
[im 59/101  bone]
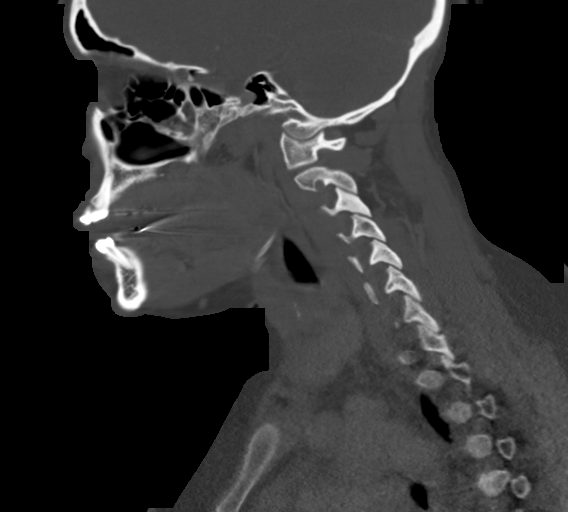
[im 67/101  bone]
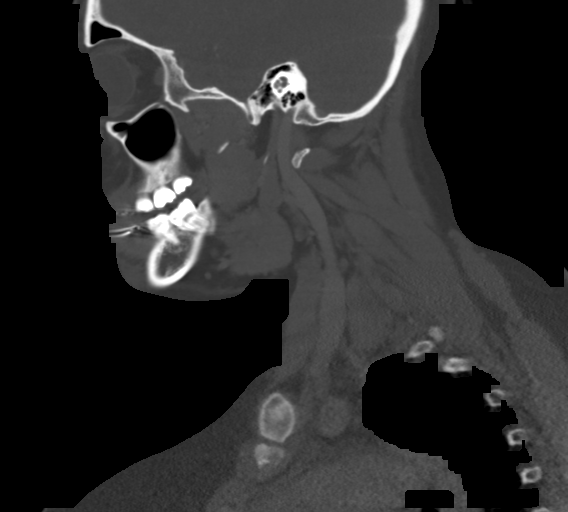

[Series 5: cor neck · coronal · 0.48mm/px · 3 of 109 slices shown]
[im 22/109  bone]
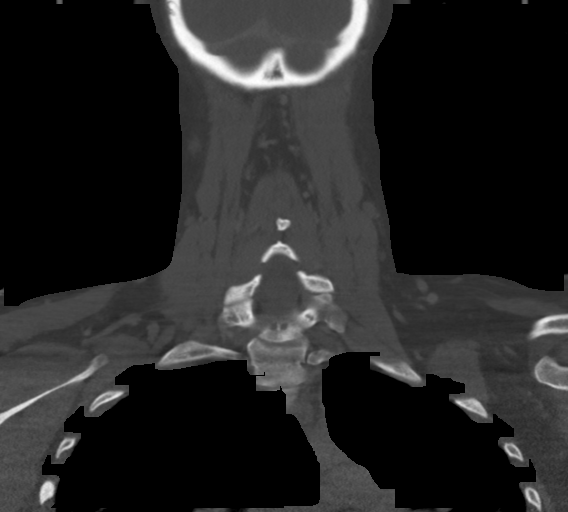
[im 44/109  bone]
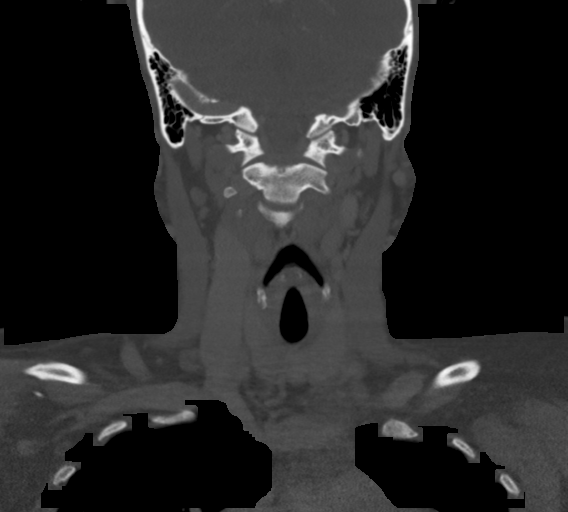
[im 65/109  bone]
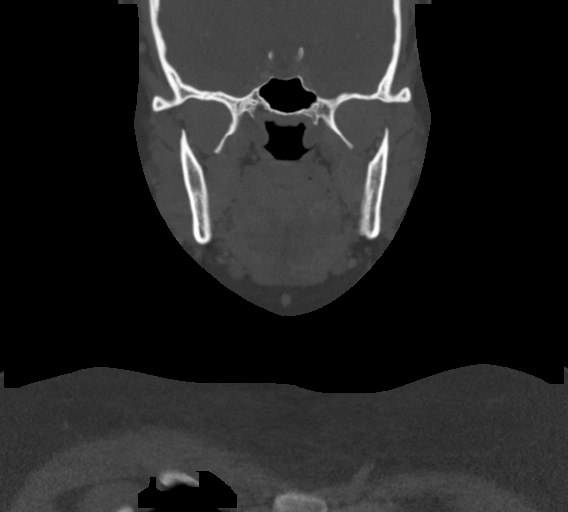

[Series 6: ax oropharynx · axial · 0.39mm/px · z∈[+1075,+1115]mm · 2 of 122 slices shown]
[im 21/122  bone]
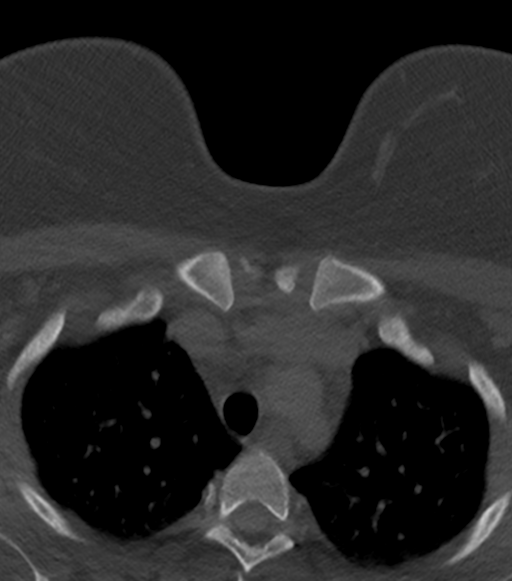
[im 41/122  bone]
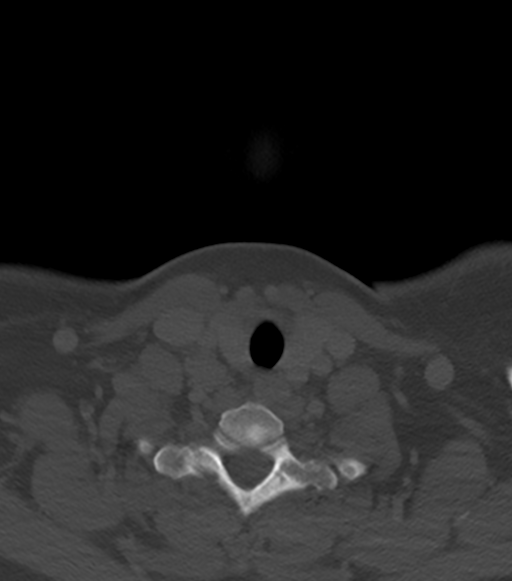

[15 of 33 positions shown; findings below may reference images not displayed]

FINDINGS: Pharynx and larynx: Normal. No mass or swelling.

Salivary glands: No inflammation, mass, or stone.

Thyroid: Negative

Lymph nodes: Right level 2 lymph node 8.5 mm in short axis. Left
level 2 lymph node 10.3 mm short axis. No change from the prior PET.
Right supraclavicular lymph node 13 x 18 mm similar to the prior
PET-CT. No new adenopathy.

Vascular: Normal vascular enhancement.

Limited intracranial: Negative

Visualized orbits: Negative

Mastoids and visualized paranasal sinuses: Negative

Skeleton: Multiple sclerotic bone lesions are present in the
cervical and thoracic spine, likely due to metastatic disease.
Correlate with prior biopsy results. No fracture.

Upper chest: Lung apices clear bilaterally.

Other: None
IMPRESSION: 1. Stable level 2 lymph nodes in neck bilaterally. Stable right
supraclavicular lymph node. No progressive or new adenopathy in the
neck.
2. Multiple sclerotic bone lesions in the cervical and thoracic
spine. Based on the history 0 likely due to metastatic disease.

## 2021-03-23 MED ORDER — IOHEXOL 300 MG/ML  SOLN
75.0000 mL | Freq: Once | INTRAMUSCULAR | Status: AC | PRN
  Administered 2021-03-23: 75 mL via INTRAVENOUS

## 2021-03-23 NOTE — Telephone Encounter (Signed)
Called pt to let her know I have released her note to her MyChart. She states the note is not there. Will consult with a co-worker to clarify where she should be able to see the note at.

## 2021-03-24 ENCOUNTER — Telehealth (INDEPENDENT_AMBULATORY_CARE_PROVIDER_SITE_OTHER): Payer: Medicaid Other | Admitting: Medical

## 2021-03-24 ENCOUNTER — Encounter: Payer: Self-pay | Admitting: *Deleted

## 2021-03-24 ENCOUNTER — Other Ambulatory Visit: Payer: Self-pay

## 2021-03-24 DIAGNOSIS — K219 Gastro-esophageal reflux disease without esophagitis: Secondary | ICD-10-CM

## 2021-03-24 DIAGNOSIS — D649 Anemia, unspecified: Secondary | ICD-10-CM | POA: Diagnosis not present

## 2021-03-24 DIAGNOSIS — E669 Obesity, unspecified: Secondary | ICD-10-CM

## 2021-03-24 DIAGNOSIS — R5383 Other fatigue: Secondary | ICD-10-CM | POA: Diagnosis not present

## 2021-03-24 DIAGNOSIS — E559 Vitamin D deficiency, unspecified: Secondary | ICD-10-CM | POA: Diagnosis not present

## 2021-03-24 NOTE — Patient Instructions (Addendum)
Describes severe GERD symptoms since 2019.  Patient uses only Tums.  Indicates does not want to use any prescription medication.  But does request referral to stomach specialist/gastroenterologist.  Placed referral today and gave patient a GI office contact information.  Describes constant fatigue.  Placed future order CBC, iron level, TSH, T4, B12 and B1.  Also on review saw vitamin D deficiency somatic vitamin D level.  History of obesity and wants to lose weight.  Was referred to weight loss management clinic but apparently could not get scheduled for a visit that fits into her schedule.   Follow-up as regular scheduled with PCP or sooner if needed.

## 2021-03-24 NOTE — Progress Notes (Signed)
   Subjective:    Patient ID: Jodi Norman, female    DOB: Nov 20, 1995, 25 y.o.   MRN: [id number]  HPI  Virtual Visit via Telephone Note  I connected with Daviona Orantes on 03/24/21 at  4:00 PM EDT by telephone and verified that I am speaking with the correct person using two identifiers.  Location: Patient: home Provider: office   I discussed the limitations, risks, security and privacy concerns of performing an evaluation and management service by telephone and the availability of in person appointments. I also discussed with the patient that there may be a patient responsible charge related to this service. The patient expressed understanding and agreed to proceed.   History of Present Illness: Pt states she went to cardiologist and they wanted her to see Gastroenterologist.  Pt sees Dr. Harriet Masson.  Pt state she has severe heart burn since 2019. Symptoms worse with eating but can have without eating. Pt also eats a lot and never is full.   Pt only uses tums. She states not "big on taking pills".  So declines rx.   She was referred to medical weight loss management. But pt states schedule does not fit.  Complains of feeling chronic fatigue.   Observations/Objective: General- no acute distress. Normal speech.  Assessment and Plan:  Patient Instructions  Describes severe GERD symptoms since 2019.  Patient uses only Tums.  Indicates does not want to use any prescription medication.  But does request referral to stomach specialist/gastroenterologist.  Placed referral today and gave patient a GI office contact information.  Describes constant fatigue.  Placed future order CBC, iron level, TSH, T4, B12 and B1.  Also on review saw vitamin D deficiency somatic vitamin D level.  History of obesity and wants to lose weight.  Was referred to weight loss management clinic but apparently could not get scheduled for a visit that fits into her schedule.   Follow-up as regular scheduled with PCP  or sooner if needed.   Time spent with patient today was 20  minutes which consisted of chart review, discussing diagnosis, work up, treatment and documentation.  Follow Up Instructions:    I discussed the assessment and treatment plan with the patient. The patient was provided an opportunity to ask questions and all were answered. The patient agreed with the plan and demonstrated an understanding of the instructions.   The patient was advised to call back or seek an in-person evaluation if the symptoms worsen or if the condition fails to improve as anticipated.     Mackie Pai, PA-C   Review of Systems     Objective:   Physical Exam        Assessment & Plan:

## 2021-03-27 ENCOUNTER — Other Ambulatory Visit (HOSPITAL_BASED_OUTPATIENT_CLINIC_OR_DEPARTMENT_OTHER): Payer: Medicaid Other

## 2021-03-27 ENCOUNTER — Ambulatory Visit (HOSPITAL_BASED_OUTPATIENT_CLINIC_OR_DEPARTMENT_OTHER): Payer: Medicaid Other

## 2021-03-28 ENCOUNTER — Other Ambulatory Visit (INDEPENDENT_AMBULATORY_CARE_PROVIDER_SITE_OTHER): Payer: Medicaid Other

## 2021-03-28 ENCOUNTER — Other Ambulatory Visit: Payer: Self-pay

## 2021-03-28 DIAGNOSIS — D649 Anemia, unspecified: Secondary | ICD-10-CM | POA: Diagnosis not present

## 2021-03-28 DIAGNOSIS — R5383 Other fatigue: Secondary | ICD-10-CM | POA: Diagnosis not present

## 2021-03-28 DIAGNOSIS — E559 Vitamin D deficiency, unspecified: Secondary | ICD-10-CM | POA: Diagnosis not present

## 2021-03-29 ENCOUNTER — Encounter: Payer: Self-pay | Admitting: Specialist

## 2021-03-29 ENCOUNTER — Ambulatory Visit: Payer: Self-pay

## 2021-03-29 ENCOUNTER — Ambulatory Visit (INDEPENDENT_AMBULATORY_CARE_PROVIDER_SITE_OTHER): Payer: Medicaid Other | Admitting: Specialist

## 2021-03-29 VITALS — BP 121/77 | HR 77 | Ht 67.0 in | Wt 246.0 lb

## 2021-03-29 DIAGNOSIS — M5137 Other intervertebral disc degeneration, lumbosacral region: Secondary | ICD-10-CM | POA: Diagnosis not present

## 2021-03-29 DIAGNOSIS — M546 Pain in thoracic spine: Secondary | ICD-10-CM | POA: Diagnosis not present

## 2021-03-29 DIAGNOSIS — M899 Disorder of bone, unspecified: Secondary | ICD-10-CM | POA: Diagnosis not present

## 2021-03-29 DIAGNOSIS — M545 Low back pain, unspecified: Secondary | ICD-10-CM

## 2021-03-29 LAB — CBC WITH DIFFERENTIAL/PLATELET
Basophils Absolute: 0 10*3/uL (ref 0.0–0.1)
Basophils Relative: 0.8 % (ref 0.0–3.0)
Eosinophils Absolute: 0.2 10*3/uL (ref 0.0–0.7)
Eosinophils Relative: 3.6 % (ref 0.0–5.0)
HCT: 32 % — ABNORMAL LOW (ref 36.0–46.0)
Hemoglobin: 10.6 g/dL — ABNORMAL LOW (ref 12.0–15.0)
Lymphocytes Relative: 39.1 % (ref 12.0–46.0)
Lymphs Abs: 2.2 10*3/uL (ref 0.7–4.0)
MCHC: 33.1 g/dL (ref 30.0–36.0)
MCV: 81.9 fl (ref 78.0–100.0)
Monocytes Absolute: 0.5 10*3/uL (ref 0.1–1.0)
Monocytes Relative: 8 % (ref 3.0–12.0)
Neutro Abs: 2.7 10*3/uL (ref 1.4–7.7)
Neutrophils Relative %: 48.5 % (ref 43.0–77.0)
Platelets: 283 10*3/uL (ref 150.0–400.0)
RBC: 3.91 Mil/uL (ref 3.87–5.11)
RDW: 15.1 % (ref 11.5–15.5)
WBC: 5.7 10*3/uL (ref 4.0–10.5)

## 2021-03-29 LAB — T4, FREE: Free T4: 0.76 ng/dL (ref 0.60–1.60)

## 2021-03-29 LAB — IRON: Iron: 39 ug/dL — ABNORMAL LOW (ref 42–145)

## 2021-03-29 LAB — TSH: TSH: 1.83 u[IU]/mL (ref 0.35–5.50)

## 2021-03-29 LAB — VITAMIN B12: Vitamin B-12: 247 pg/mL (ref 211–911)

## 2021-03-29 MED ORDER — TRAMADOL-ACETAMINOPHEN 37.5-325 MG PO TABS: 1.0000 | ORAL_TABLET | Freq: Four times a day (QID) | ORAL | 0 refills | Status: DC | PRN

## 2021-03-29 MED ORDER — IRON (FERROUS SULFATE) 325 (65 FE) MG PO TABS: 325.0000 mg | ORAL_TABLET | Freq: Every day | ORAL | 2 refills | Status: DC

## 2021-03-29 NOTE — Addendum Note (Signed)
Addended by: Anabel Halon on: 03/29/2021 09:51 PM   Modules accepted: Orders

## 2021-03-29 NOTE — Progress Notes (Signed)
Office Visit Note   Patient: Jodi Norman           Date of Birth: 1996/02/18           MRN: [id number] Visit Date: 03/29/2021              Requested by: [address] [place], [place] [address] [place],  [place] [address] PCP: Carollee Herter, Alferd Apa, DO   Assessment & Plan: Visit Diagnoses:  1. Back pain of thoracolumbar region   2. Bone lesion (abnormal PET scan)   3. Disc disease, degenerative, lumbar or lumbosacral     Plan: Avoid frequent bending and stooping  No lifting greater than 10 lbs. May use ice or moist heat for pain. Weight loss is of benefit. Best medication for lumbar disc disease is arthritis medications like motrin, celebrex and naprosyn. Exercise is important to improve your indurance and does allow people to function better inspite of back pain. Tramadol for pain that is not helped with ibuprofen. May have bone lesions due to Vitamin D deficiency, osteitis fibrosa cystica, recommend evaluation of PTH, calcium and 24 hour urine calcium and hydroxyproline. Needs a total body bone scan and bone density testing.   Follow-Up Instructions: Return in about 3 weeks (around 04/19/2021).   Orders:  Orders Placed This Encounter  Procedures   XR Lumb Spine Flex&Ext Only   DG BONE DENSITY (DXA)   NM Bone Scan Whole Body   Calcium, Urine, 24 Hour   PTH, Intact and Calcium   No orders of the defined types were placed in this encounter.     Procedures: No procedures performed   Clinical Data: No additional findings.   Subjective: Chief Complaint  Patient presents with   Spine - Follow-up    MRI Review of Thoracic and Lumbar spines    25 year old female with history of back pain that dates back to when she was a child age [age]. Reports she fell down 6 steps of a two tiered stair and had an MRI done due to pain and has had discomfort. She feels like an old person and is having difficulty washing dishes, carrying laundry and groceries. No  bowel or bladder difficulty. Blood sugars are okay. Could the inflamation be causing pain? She weights 240 and has gained since child delivery. She can walk one mile but it would hurt. It feels like my spine to going in towards my stomach. 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 She did exercise regularly till mid summer, had a physical trainer and [employer] and [employer] and even tried yoga for a week. Sitting bothers her after 2-3 hours and has to take frequent breaks. Her ankle swell with sitting.  Works 8 to 4:30PM. Has been told that she has lesions of the spine and sees Dr. Marin Olp for this. He has not treated her for this. 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 She is wondering if the MRI shows anything different from the PET scans. Saw Benjiman Core PA-C on 03/16/2021.   Review of Systems  Constitutional: Negative.   HENT: Negative.    Eyes: Negative.   Respiratory: Negative.    Cardiovascular: Negative.   Gastrointestinal: Negative.   Endocrine: Negative.   Genitourinary: Negative.   Musculoskeletal: Negative.   Skin: Negative.   Allergic/Immunologic: Negative.   Neurological: Negative.   Hematological: Negative.   Psychiatric/Behavioral: Negative.      Objective: Vital Signs: BP 121/77 (BP Location: Left  Arm, Patient Position: Sitting)   Pulse 77   Ht 5\' 7"  (1.702 m)   Wt 246 lb (111.6 kg)   LMP 03/06/2021 (Approximate)   BMI 38.53 kg/m   Physical Exam Constitutional:      Appearance: She is well-developed.  HENT:     Head: Normocephalic and atraumatic.  Eyes:     Pupils: Pupils are equal, round, and reactive to light.  Pulmonary:     Effort: Pulmonary effort is normal.     Breath sounds: Normal breath sounds.  Abdominal:     General: Bowel sounds are normal.     Palpations: Abdomen is soft.  Musculoskeletal:        General: Normal range of motion.     Cervical back: Normal range of motion and neck supple.  Skin:    General: Skin is warm and dry.  Neurological:      Mental Status: She is alert and oriented to person, place, and time.  Psychiatric:        Behavior: Behavior normal.        Thought Content: Thought content normal.        Judgment: Judgment normal.    Ortho Exam  Specialty Comments:  No specialty comments available.  Imaging: No results found.   PMFS History: Patient Active Problem List   Diagnosis Date Noted   History of scoliosis 03/06/2021   Chronic midline back pain 03/06/2021   Bone lesion 10/27/2020   Daytime somnolence 07/11/2020   Other fatigue 15/72/6203   Metabolic syndrome 55/97/4163   History of pre-eclampsia  07/11/2020   Obesity (BMI 30-39.9) 07/11/2020   Insomnia 07/08/2020   Intractable migraine without aura and without status migrainosus 07/08/2020   Chest pain 07/08/2020   Generalized anxiety disorder 06/24/2020   Carpal tunnel syndrome, bilateral 05/07/2018   Cesarean delivery delivered 04/28/2018   Right hand paresthesia 06/19/2017   Past Medical History:  Diagnosis Date   Generalized anxiety disorder 06/24/2020    Family History  Problem Relation Age of Onset   Hyperlipidemia Mother    Hypertension Mother    Hyperlipidemia Father    Hypertension Father    Diabetes Father     No past surgical history on file. Social History   Occupational History   Not on file  Tobacco Use   Smoking status: Never   Smokeless tobacco: Never  Vaping Use   Vaping Use: Never used  Substance and Sexual Activity   Alcohol use: Never   Drug use: Never   Sexual activity: Not Currently    Birth control/protection: None

## 2021-03-29 NOTE — Patient Instructions (Addendum)
  Plan: Avoid frequent bending and stooping  No lifting greater than 10 lbs. May use ice or moist heat for pain. Weight loss is of benefit. Best medication for lumbar disc disease is arthritis medications like motrin, celebrex and naprosyn. Exercise is important to improve your indurance and does allow people to function better inspite of back pain. Tramadol for pain that is not helped with ibuprofen. May have bone lesions due to Vitamin D deficiency, osteitis fibrosa cystica, recommend evaluation of PTH, calcium and 24 hour urine calcium and hydroxyproline. Needs a total body bone scan and bone density testing.

## 2021-03-30 ENCOUNTER — Telehealth: Payer: Self-pay | Admitting: Family Medicine

## 2021-03-30 NOTE — Telephone Encounter (Signed)
Patient was recommended by Mackie Pai to start B12 shots. She was informed that Nurse visits are from [place] to 11:30am Tuesday to Friday and she stated that she works and cannot come in those dates or times. She would like to know if she could get those shots at another place. Please advice.

## 2021-03-30 NOTE — Telephone Encounter (Signed)
Patient states she got permission from her manager to come in during those times. No longer need advice.

## 2021-04-01 LAB — VITAMIN B1: Vitamin B1 (Thiamine): 13 nmol/L (ref 8–30)

## 2021-04-02 LAB — VITAMIN D 1,25 DIHYDROXY
Vitamin D 1, 25 (OH)2 Total: 92 pg/mL — ABNORMAL HIGH (ref 18–72)
Vitamin D2 1, 25 (OH)2: 57 pg/mL
Vitamin D3 1, 25 (OH)2: 35 pg/mL

## 2021-04-03 ENCOUNTER — Other Ambulatory Visit: Payer: Self-pay

## 2021-04-03 ENCOUNTER — Encounter: Payer: Self-pay | Admitting: Family Medicine

## 2021-04-03 ENCOUNTER — Telehealth: Payer: Self-pay | Admitting: Family Medicine

## 2021-04-03 ENCOUNTER — Ambulatory Visit (INDEPENDENT_AMBULATORY_CARE_PROVIDER_SITE_OTHER): Payer: Medicaid Other

## 2021-04-03 ENCOUNTER — Ambulatory Visit (HOSPITAL_BASED_OUTPATIENT_CLINIC_OR_DEPARTMENT_OTHER)
Admission: RE | Admit: 2021-04-03 | Discharge: 2021-04-03 | Disposition: A | Discharge status: Home / Self Care | Payer: Medicaid Other | Source: Ambulatory Visit | Attending: Specialist | Admitting: Specialist

## 2021-04-03 DIAGNOSIS — M546 Pain in thoracic spine: Secondary | ICD-10-CM | POA: Insufficient documentation

## 2021-04-03 DIAGNOSIS — M899 Disorder of bone, unspecified: Secondary | ICD-10-CM | POA: Insufficient documentation

## 2021-04-03 DIAGNOSIS — M545 Low back pain, unspecified: Secondary | ICD-10-CM | POA: Diagnosis present

## 2021-04-03 DIAGNOSIS — E538 Deficiency of other specified B group vitamins: Secondary | ICD-10-CM | POA: Diagnosis not present

## 2021-04-03 MED ORDER — CYANOCOBALAMIN 1000 MCG/ML IJ SOLN
1000.0000 ug | Freq: Once | INTRAMUSCULAR | Status: AC
  Administered 2021-04-03: 1000 ug via INTRAMUSCULAR

## 2021-04-03 NOTE — Telephone Encounter (Signed)
Pt dropped off document to be filled out by provider (Medical Information and restriction assessment- 5 pages) Pt would like to be called when document ready to pick up pt tel [phone]. Document put at front office tray under providers name.

## 2021-04-03 NOTE — Progress Notes (Addendum)
Pt here for monthly B12 injection per Elise Benne  B12 1079mcg given IM, right deltoid and pt tolerated injection well.  Next B12 injection scheduled for next week  Mackie Pai, PA-C

## 2021-04-04 ENCOUNTER — Ambulatory Visit: Payer: Medicaid Other

## 2021-04-05 ENCOUNTER — Encounter (HOSPITAL_BASED_OUTPATIENT_CLINIC_OR_DEPARTMENT_OTHER): Payer: Self-pay

## 2021-04-05 ENCOUNTER — Emergency Department (HOSPITAL_BASED_OUTPATIENT_CLINIC_OR_DEPARTMENT_OTHER)
Admission: EM | Admit: 2021-04-05 | Discharge: 2021-04-06 | Disposition: A | Discharge status: Home / Self Care | Payer: Medicaid Other | Attending: Emergency Medicine | Admitting: Emergency Medicine

## 2021-04-05 ENCOUNTER — Ambulatory Visit: Payer: Medicaid Other

## 2021-04-05 ENCOUNTER — Other Ambulatory Visit: Payer: Self-pay

## 2021-04-05 DIAGNOSIS — J101 Influenza due to other identified influenza virus with other respiratory manifestations: Secondary | ICD-10-CM | POA: Diagnosis not present

## 2021-04-05 DIAGNOSIS — Z20822 Contact with and (suspected) exposure to covid-19: Secondary | ICD-10-CM | POA: Diagnosis not present

## 2021-04-05 DIAGNOSIS — R509 Fever, unspecified: Secondary | ICD-10-CM | POA: Diagnosis present

## 2021-04-05 LAB — RESP PANEL BY RT-PCR (FLU A&B, COVID) ARPGX2
Influenza A by PCR: POSITIVE — AB
Influenza B by PCR: NEGATIVE
SARS Coronavirus 2 by RT PCR: NEGATIVE

## 2021-04-05 MED ORDER — OSELTAMIVIR PHOSPHATE 75 MG PO CAPS
75.0000 mg | ORAL_CAPSULE | Freq: Once | ORAL | Status: AC
  Administered 2021-04-06: 75 mg via ORAL
  Filled 2021-04-05: qty 1

## 2021-04-05 MED ORDER — ONDANSETRON 4 MG PO TBDP
8.0000 mg | ORAL_TABLET | Freq: Once | ORAL | Status: AC
  Administered 2021-04-05: 8 mg via ORAL
  Filled 2021-04-05: qty 2

## 2021-04-05 MED ORDER — IBUPROFEN 800 MG PO TABS
800.0000 mg | ORAL_TABLET | Freq: Once | ORAL | Status: AC
  Administered 2021-04-05: 800 mg via ORAL
  Filled 2021-04-05: qty 1

## 2021-04-05 NOTE — ED Triage Notes (Signed)
Pt c/o fever x today-URI sx started yesterday-denies cough-NAD-steady gait

## 2021-04-05 NOTE — ED Provider Notes (Signed)
[facility] Provider Note: Georgena Spurling, MD, FACEP  CSN: [id number] MRN: [id number] ARRIVAL: 04/05/21 at 2041 ROOM: [place]  URI   HISTORY OF PRESENT ILLNESS  04/05/21 11:01 PM Jodi Norman is a 25 y.o. female with URI symptoms that started yesterday.  Specifically she has had fever, nasal congestion, sore throat, chills, body aches and headache.  She does not have cough, vomiting or diarrhea but has had nausea.  She rates her body aches as a 7 out of 10.  Her daughter recently tested positive for flu.   Past Medical History:  Diagnosis Date   Generalized anxiety disorder 06/24/2020    History reviewed. No pertinent surgical history.  Family History  Problem Relation Age of Onset   Hyperlipidemia Mother    Hypertension Mother    Hyperlipidemia Father    Hypertension Father    Diabetes Father     Social History   Tobacco Use   Smoking status: Never   Smokeless tobacco: Never  Vaping Use   Vaping Use: Never used  Substance Use Topics   Alcohol use: Never   Drug use: Never    Prior to Admission medications   Medication Sig Start Date End Date Taking? Authorizing Provider  omeprazole (PRILOSEC) 20 MG capsule Take 1 capsule (20 mg total) by mouth daily. 04/06/21  Yes Lusia Greis, MD  ondansetron (ZOFRAN ODT) 8 MG disintegrating tablet Take 1 tablet (8 mg total) by mouth every 8 (eight) hours as needed for nausea or vomiting. 04/06/21  Yes Xcaret Morad, MD  oseltamivir (TAMIFLU) 75 MG capsule Take 1 capsule (75 mg total) by mouth every 12 (twelve) hours. 04/06/21  Yes Kassidee Narciso, MD  amitriptyline (ELAVIL) 10 MG tablet Take 10 mg by mouth at bedtime as needed. 12/28/20   [provider]  Cetirizine HCl 10 MG CAPS Take 1 capsule (10 mg total) by mouth daily for 10 days. 01/06/21 03/22/21  Wieters, Hallie C, PA-C  clonazePAM (KLONOPIN) 0.5 MG tablet Take 1 tablet (0.5 mg total) by mouth 2 (two) times daily as needed for anxiety.  11/17/20   Ann Held, DO  ibuprofen (ADVIL) 400 MG tablet Take 1 tablet (400 mg total) by mouth every 8 (eight) hours as needed for moderate pain. 02/28/21   Ripley Fraise, MD  Iron, Ferrous Sulfate, 325 (65 Fe) MG TABS Take 325 mg by mouth daily. 03/29/21   Saguier, Percell Miller, PA-C  meclizine (ANTIVERT) 25 MG tablet Take 1 tablet (25 mg total) by mouth 2 (two) times daily as needed for dizziness. 02/28/21   Ripley Fraise, MD  traMADol-acetaminophen (ULTRACET) 37.5-325 MG tablet Take 1 tablet by mouth every 6 (six) hours as needed. 03/29/21   Jessy Oto, MD  Vitamin D, Ergocalciferol, (DRISDOL) 1.25 MG (50000 UNIT) CAPS capsule Take 1 capsule (50,000 Units total) by mouth every 7 (seven) days. 11/17/20   Ann Held, DO    Allergies Patient has no known allergies.   REVIEW OF SYSTEMS  Negative except as noted here or in the History of Present Illness.   PHYSICAL EXAMINATION  Initial Vital Signs Blood pressure (!) 114/55, pulse 92, temperature 99.4 F (37.4 C), temperature source Oral, resp. rate 18, height 5\' 7"  (1.702 m), weight 111.6 kg, last menstrual period 03/28/2021, SpO2 100 %.  Examination General: Well-developed, well-nourished female in no acute distress; appearance consistent with age of record HENT: normocephalic; atraumatic; pharynx normal Eyes: pupils equal, round and reactive to light; extraocular  muscles intact Neck: supple Heart: regular rate and rhythm Lungs: clear to auscultation bilaterally Abdomen: soft; nondistended; nontender; bowel sounds present Extremities: No deformity; full range of motion Neurologic: Awake, alert and oriented; motor function intact in all extremities and symmetric; no facial droop Skin: Warm and dry Psychiatric: Normal mood and affect   RESULTS  Summary of this visit's results, reviewed and interpreted by myself:   EKG Interpretation  Date/Time:    Ventricular Rate:    PR Interval:    QRS Duration:   QT  Interval:    QTC Calculation:   R Axis:     Text Interpretation:         Laboratory Studies: Results for orders placed or performed during the hospital encounter of 04/05/21 (from the past 24 hour(s))  Resp Panel by RT-PCR (Flu A&B, Covid) Nasopharyngeal Swab     Status: Abnormal   Collection Time: 04/05/21 10:18 PM   Specimen: Nasopharyngeal Swab; Nasopharyngeal(NP) swabs in vial transport medium  Result Value Ref Range   SARS Coronavirus 2 by RT PCR NEGATIVE NEGATIVE   Influenza A by PCR POSITIVE (A) NEGATIVE   Influenza B by PCR NEGATIVE NEGATIVE   Imaging Studies: No results found.  ED COURSE and MDM  Nursing notes, initial and subsequent vitals signs, including pulse oximetry, reviewed and interpreted by myself.  Vitals:   04/05/21 2054 04/05/21 2055  BP: (!) 114/55   Pulse: 92   Resp: 18   Temp: 99.4 F (37.4 C)   TempSrc: Oral   SpO2: 100%   Weight:  111.6 kg  Height:  5\' 7"  (1.702 m)   Medications  oseltamivir (TAMIFLU) capsule 75 mg (has no administration in time range)  ibuprofen (ADVIL) tablet 800 mg (800 mg Oral Given 04/05/21 2317)  ondansetron (ZOFRAN-ODT) disintegrating tablet 8 mg (8 mg Oral Given 04/05/21 2318)   Patient positive for influenza A.  Will start on Tamiflu.  Patient also requesting nausea and GERD medication.   PROCEDURES  Procedures   ED DIAGNOSES     ICD-10-CM   1. Influenza A  J10.1          Randell Detter, MD 04/06/21 0001

## 2021-04-06 MED ORDER — OMEPRAZOLE 20 MG PO CPDR: 20.0000 mg | DELAYED_RELEASE_CAPSULE | Freq: Every day | ORAL | 0 refills | Status: DC

## 2021-04-06 MED ORDER — ONDANSETRON 8 MG PO TBDP: 8.0000 mg | ORAL_TABLET | Freq: Three times a day (TID) | ORAL | 0 refills | Status: DC | PRN

## 2021-04-06 MED ORDER — OSELTAMIVIR PHOSPHATE 75 MG PO CAPS: 75.0000 mg | ORAL_CAPSULE | Freq: Two times a day (BID) | ORAL | 0 refills | Status: DC

## 2021-04-06 NOTE — Telephone Encounter (Signed)
Received

## 2021-04-07 ENCOUNTER — Other Ambulatory Visit: Payer: Self-pay

## 2021-04-07 ENCOUNTER — Telehealth: Payer: Self-pay | Admitting: Hematology & Oncology

## 2021-04-07 DIAGNOSIS — R590 Localized enlarged lymph nodes: Secondary | ICD-10-CM

## 2021-04-07 NOTE — Telephone Encounter (Signed)
I called Ms. Foresta this afternoon.  First all, I wished her a happy birthday.  Is her 25th birthday.  She has busy getting her make-up done.  I feel bad that I had to interrupt that.  She did have the MRI that was done back on 03/20/2021.  This certainly did not show anything that was definitive for malignancy.  There is no real change in the bony structure.  Dr. Louanne Skye, of Orthopedic Surgery Saw her.  He thought maybe she had vitamin D deficiency.  I had ordered a CT of the neck.  This was done on 03/23/2021.  This showed some lymphadenopathy which was basically stable.  However, there was a right supraclavicular lymph node measuring 13 x 18 mm.  I will know if this might be a lymph node that could be potentially a biopsy.  I told her that we can set up a lymph node biopsy for her.  We can do this as an outpatient.  I told her that the lymph node may or may not show [place] what is going on.  It may be reactive.  I still do not think that we are dealing with a malignancy.  She has had no change in her scans for about 8 months.  I have never seen any kind of malignancy stays stable for this amount of time.  I cannot imagine what she would have that would be malignant.  She is working.  She sounded good.  I just hate that she has this pain.  She agreed to the lymph node biopsy.  I will see about getting this set up.  I want to make sure that it does not interfere with her work which is important for her.  Lattie Haw, MD

## 2021-04-11 ENCOUNTER — Encounter: Payer: Self-pay | Admitting: Family Medicine

## 2021-04-11 ENCOUNTER — Ambulatory Visit: Payer: Medicaid Other | Attending: Family Medicine | Admitting: Physical Therapy

## 2021-04-11 ENCOUNTER — Encounter (HOSPITAL_COMMUNITY): Payer: Self-pay | Admitting: Radiology

## 2021-04-11 ENCOUNTER — Other Ambulatory Visit: Payer: Self-pay

## 2021-04-11 ENCOUNTER — Encounter: Payer: Self-pay | Admitting: Physical Therapy

## 2021-04-11 DIAGNOSIS — M62838 Other muscle spasm: Secondary | ICD-10-CM | POA: Diagnosis present

## 2021-04-11 DIAGNOSIS — M419 Scoliosis, unspecified: Secondary | ICD-10-CM | POA: Diagnosis not present

## 2021-04-11 DIAGNOSIS — G8929 Other chronic pain: Secondary | ICD-10-CM | POA: Insufficient documentation

## 2021-04-11 DIAGNOSIS — M6281 Muscle weakness (generalized): Secondary | ICD-10-CM | POA: Diagnosis present

## 2021-04-11 DIAGNOSIS — M5441 Lumbago with sciatica, right side: Secondary | ICD-10-CM | POA: Insufficient documentation

## 2021-04-11 DIAGNOSIS — R29898 Other symptoms and signs involving the musculoskeletal system: Secondary | ICD-10-CM | POA: Insufficient documentation

## 2021-04-11 DIAGNOSIS — R293 Abnormal posture: Secondary | ICD-10-CM | POA: Insufficient documentation
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
## 2021-04-11 NOTE — Progress Notes (Signed)
Patient Name  Jodi Norman, Jodi Norman Legal Sex  Female DOB  [birth year] SSN  [id number] Address  [address] [place] [address] Phone  [phone] (Home)  [phone] (Mobile)    RE: [place] CORE BIOPSY (LYMPH NODES) Received: Today Sandi Mariscal, MD  Garth Bigness D OK for US guided R Cx LN Bx.   Neck CT - 10/27 - image 50 or 86 (both were hypermetabolic on prior PET from 10/22   Sedation per pt request.   Cathren Harsh        Previous Messages   ----- Message -----  From: Garth Bigness D  Sent: 04/11/2021   3:38 PM EST  To: Ir Procedure Requests  Subject: FW: [place] CORE BIOPSY (LYMPH NODES)               Still need review  ----- Message -----  From: Garth Bigness D  Sent: 04/07/2021   5:17 PM EST  To: Ir Procedure Requests  Subject: [place] CORE BIOPSY (LYMPH NODES)                   Procedure:  [place] CORE BIOPSY (LYMPH NODES)   Reason:  Lymphadenopathy, supraclavicular, Right supraclavicular lymph node that is slightly swollen    She has is undefined bony issue. She has had a PET scan and MRIs of the spine. There is always mention of malignancy. Nothing has changed for 8-9 months. Patient has a slightly enlarged right supraclavicular lymph node. This may be a reactive lymph node. This also may give [place] an idea as to what might be going on with her spine. Hopefully, you will be able to get a core biopsy. Thanks so much for trying to help. I will keep praying hard for you. Pete    History:  CT, MR, NM PET in computer   Provider:  Volanda Napoleon   Provider Contact:  [phone]

## 2021-04-11 NOTE — Patient Instructions (Signed)
Access Code: [email or web address] URL: [email or web address] Date: 04/11/2021 Prepared by: Glenetta Hew  Exercises Supine Posterior Pelvic Tilt - 2 x daily - 7 x weekly - 2 sets - 10 reps Supine Lower Trunk Rotation - 2 x daily - 7 x weekly - 2 sets - 10 reps

## 2021-04-12 ENCOUNTER — Encounter: Payer: Self-pay | Admitting: Nurse Practitioner

## 2021-04-12 ENCOUNTER — Ambulatory Visit (INDEPENDENT_AMBULATORY_CARE_PROVIDER_SITE_OTHER): Payer: Medicaid Other | Admitting: Nurse Practitioner

## 2021-04-12 ENCOUNTER — Ambulatory Visit (INDEPENDENT_AMBULATORY_CARE_PROVIDER_SITE_OTHER): Payer: Medicaid Other

## 2021-04-12 ENCOUNTER — Telehealth: Payer: Self-pay

## 2021-04-12 VITALS — BP 110/74 | HR 91 | Ht 67.0 in | Wt 241.0 lb

## 2021-04-12 DIAGNOSIS — R002 Palpitations: Secondary | ICD-10-CM

## 2021-04-12 DIAGNOSIS — E538 Deficiency of other specified B group vitamins: Secondary | ICD-10-CM

## 2021-04-12 DIAGNOSIS — K219 Gastro-esophageal reflux disease without esophagitis: Secondary | ICD-10-CM | POA: Diagnosis not present

## 2021-04-12 DIAGNOSIS — R0602 Shortness of breath: Secondary | ICD-10-CM | POA: Diagnosis not present

## 2021-04-12 MED ORDER — CYANOCOBALAMIN 1000 MCG/ML IJ SOLN
1000.0000 ug | Freq: Once | INTRAMUSCULAR | Status: AC
  Administered 2021-04-12: 1000 ug via INTRAMUSCULAR

## 2021-04-12 MED ORDER — OMEPRAZOLE 20 MG PO CPDR: 20.0000 mg | DELAYED_RELEASE_CAPSULE | Freq: Two times a day (BID) | ORAL | 1 refills | Status: DC

## 2021-04-12 NOTE — Progress Notes (Addendum)
04/12/2021 Jodi Norman [id number] 1995-06-01   CHIEF COMPLAINT: Heartburn, never feels full after eating   HISTORY OF PRESENT ILLNESS:  Jodi Norman is a 25 year old female with a past medical history of anxiety, pre-eclampsia. + PET scan showed bone lesions (biopsy was normal), possible Paget's disease and GERD. Past C section. She presents to our officet today to discuss scheduling an EGD prior to pursuing gastric sleeve surgery. She complains of having daily heartburn. No dysphagia. She eats "nonstop", never feels full. She is taking Omeprazole 2mg  QD. She reported gaining 100lbs over the past 2 years. She is passing a normal formed brown stool most days with occasional constipation. If she strains to pass a BM she sees bright red blood on the stool. She was recently found to have IDA, started on Ferrous Sulfate. 12 level low normal, B12 injections ordered per PCP.  She tested positive for influenza A 04/05/2021, felt achy with fatigue with improvement.   She was evaluated by cardiologist Dr. Berniece Salines on 03/22/2021 due to having fatigue, palpitation and SOB. An ECHO and sleep study were ordered but not yet done. She stated her palpitations abated after she stopped drinking caffeinated coffee.    CBC Latest Ref Rng & Units 03/28/2021 03/06/2021 02/28/2021  WBC 4.0 - 10.5 K/uL 5.7 5.3 7.1  Hemoglobin 12.0 - 15.0 g/dL 10.6(L) 10.5(L) 10.6(L)  Hematocrit 36.0 - 46.0 % 32.0(L) 31.5(L) 32.0(L)  Platelets 150.0 - 400.0 K/uL 283.0 256 267  Iron 39.  Vitamin B12 247.   CMP Latest Ref Rng & Units 03/06/2021 02/28/2021 01/07/2021  Glucose 70 - 99 mg/dL 90 100(H) 98  BUN 6 - 20 mg/dL 9 11 7   Creatinine 0.44 - 1.00 mg/dL 0.73 0.75 0.65  Sodium 135 - 145 mmol/L 138 135 140  Potassium 3.5 - 5.1 mmol/L 4.1 3.8 4.0  Chloride 98 - 111 mmol/L 104 103 106  CO2 22 - 32 mmol/L 27 25 26   Calcium 8.9 - 10.3 mg/dL 9.6 9.1 8.9  Total Protein 6.5 - 8.1 g/dL 7.3 - 7.5  Total Bilirubin 0.3 - 1.2 mg/dL  0.3 - 0.3  Alkaline Phos 38 - 126 U/L 90 - 99  AST 15 - 41 U/L 14(L) - 19  ALT 0 - 44 U/L 9 - 18     Past Medical History:  Diagnosis Date   Generalized anxiety disorder 06/24/2020   GERD (gastroesophageal reflux disease)    Past Surgical History:  Procedure Laterality Date   CESAREAN SECTION    Liposuction.  She denies any problems with sedation/anesthesia or airway management/intubation during any past procedure or surgery.   Social History: She has one daughter. She is single. Nonsmoker. No alcohol or drug use.   Family History: Mother and father with hypertension and hyperlipidemia. Father also had diabetes. Paternal grandmother and grandfather had some form of cancer, further details unclear. No known family history of esophageal, gastric or colon cancer.   No Known Allergies    Outpatient Encounter Medications as of 04/12/2021  Medication Sig   amitriptyline (ELAVIL) 10 MG tablet Take 10 mg by mouth at bedtime as needed.   clonazePAM (KLONOPIN) 0.5 MG tablet Take 1 tablet (0.5 mg total) by mouth 2 (two) times daily as needed for anxiety.   doxycycline (VIBRAMYCIN) 100 MG capsule Take 100 mg by mouth daily.   Iron, Ferrous Sulfate, 325 (65 Fe) MG TABS Take 325 mg by mouth daily.   omeprazole (PRILOSEC) 20 MG capsule Take 1 capsule (20  mg total) by mouth daily.   ondansetron (ZOFRAN ODT) 8 MG disintegrating tablet Take 1 tablet (8 mg total) by mouth every 8 (eight) hours as needed for nausea or vomiting. (Patient not taking: Reported on 04/11/2021)   traMADol-acetaminophen (ULTRACET) 37.5-325 MG tablet Take 1 tablet by mouth every 6 (six) hours as needed.   Vitamin D, Ergocalciferol, (DRISDOL) 1.25 MG (50000 UNIT) CAPS capsule Take 1 capsule (50,000 Units total) by mouth every 7 (seven) days.   [DISCONTINUED] oseltamivir (TAMIFLU) 75 MG capsule Take 1 capsule (75 mg total) by mouth every 12 (twelve) hours. (Patient not taking: Reported on 04/11/2021)   No facility-administered  encounter medications on file as of 04/12/2021.    REVIEW OF SYSTEMS:  Gen: + Fatigue and night sweats. No weight loss.  CV: Denies chest pain, palpitations or edema. Resp: Denies cough, shortness of breath of hemoptysis.  GI: See HPI.  GU : Increased urine frequency.  GYN: No significant menorrhagia.  MS: Muscle cramps and back pain.  Derm: Denies rash, itchiness, skin lesions or unhealing ulcers. Psych: + Anxiety.  Heme: Denies bruising, bleeding. Neuro:  Denies headaches, dizziness or paresthesias. Endo:  Denies any problems with DM, thyroid or adrenal function.  PHYSICAL EXAM: BP 110/74   Pulse 91   Ht 5\' 7"  (1.702 m)   Wt 241 lb (109.3 kg)   LMP 03/28/2021 (Approximate)   BMI 37.75 kg/m   General: Obese 25 year old female in NAD.  Head: Normocephalic and atraumatic. Eyes:  Sclerae non-icteric, conjunctive pink. Ears: Normal auditory acuity. Mouth: Dentition intact. No ulcers or lesions.  Neck: Supple, no lymphadenopathy or thyromegaly.  Lungs: Clear bilaterally to auscultation without wheezes, crackles or rhonchi. Heart: Regular rate and rhythm. No murmur, rub or gallop appreciated.  Abdomen: Soft, nontender, non distended. No masses. No hepatosplenomegaly. Normoactive bowel sounds x 4 quadrants.  Rectal: Deferred.  Musculoskeletal: Symmetrical with no gross deformities. Skin: Warm and dry. No rash or lesions on visible extremities. Extremities: No edema. Neurological: Alert oriented x 4, no focal deficits.  Psychological:  Alert and cooperative. Normal mood and affect.  ASSESSMENT AND PLAN:  45) 25 year old female with GERD symptoms, considering future gastric sleeve surgery. -Cardiac clearance required prior to scheduling an EGD -Increase Omeprazole 20mg  one po bid -GERD diet handout   2) Constipation with associated rectal bleeding -Benefiber as tolerated -Miralax Q HS PRN -Cardiac clearance required prior to scheduling a diagnostic colonoscopy  3)  Fatigue, SOB and palpitations followed by cardiology. ECHO ordered not yet completed. Sleep study recommended, not yet completed.   4) Positive PET scan lesions on her bones with negative biopsy thought to be Pageti's disease followed by Dr. Marin Olp. PTH pending.  -Repeat PET scan 04/2021 per Dr. Marin Olp   5) Iron deficiency anemia. Hg 10.6. HCT 32.8. MCV 81.9. Iron 39. B12. Recently started on Ferrous Sulfate 325mg  one QD. Borderline low B12 levels. B 12 injections  ordered per PCP.   ADDENDUM: Cardiac clearance received 05/09/2021. Msg from cardiologist Dr. Harriet Masson as follows:  Her echo was normal. She did not have an office visit, but I send a message letting her know she can move forward with her GI procedure.  CC:  Ann Held, *

## 2021-04-12 NOTE — Telephone Encounter (Signed)
Pt made aware. Form placed at the front

## 2021-04-12 NOTE — Telephone Encounter (Signed)
Unfortunately, I cannot make specific statements plans about the patient, she has had shortness of breath and an echocardiogram was recommended and she has had been willing to get  this testing done.  I would recommend she get the echocardiogram which will help rule out dilated cardiomyopathy as a source of her shortness of breath.

## 2021-04-12 NOTE — Progress Notes (Signed)
Pt is here for 2/4 B12 injections. Pt was given B12 injection in left deltoid. Pt tolerated well

## 2021-04-12 NOTE — Telephone Encounter (Signed)
Pt made aware of forms are ready for pick up

## 2021-04-12 NOTE — Telephone Encounter (Signed)
Request for surgical clearance:     Endoscopy Procedure  What type of surgery is being performed?     Colonoscopy/EGD  When is this surgery scheduled?     TBD pending clearance from cardiology  What type of clearance is required Cardiac  Practice name and name of physician performing surgery?      [facility]  What is your office phone and fax number?      Phone- [phone]  [phone]  Anesthesia type (None, local, MAC, general) ?       MAC

## 2021-04-12 NOTE — Patient Instructions (Addendum)
The [facility] providers would like to encourage you to use [facility] to communicate with providers for non-urgent requests or questions.  Due to long hold times on the telephone, sending your provider a message by [facility] may be faster and more efficient way to get a response. Please allow 48 business hours for a response.  Please remember that this is for non-urgent requests/questions.  MEDICATION: We have sent the following medication to your pharmacy for you to pick up at your convenience: Omeprazole 20 MG tablet, take 1 tablet twice a day.  We will contact your cardiologist for clearance before proceeding with a procedure. Follow a GERD diet/recommendations. It was great seeing you today! Thank you for entrusting me with your care and choosing [facility].  Noralyn Pick, CRNP  Gastroesophageal Reflux Disease, Adult Gastroesophageal reflux (GER) happens when acid from the stomach flows up into the tube that connects the mouth and the stomach (esophagus). Normally, food travels down the esophagus and stays in the stomach to be digested. With GER, food and stomach acid sometimes move back up into the esophagus. You may have a disease called gastroesophageal reflux disease (GERD) if the reflux: Happens often. Causes frequent or very bad symptoms. Causes problems such as damage to the esophagus. When this happens, the esophagus becomes sore and swollen. Over time, GERD can make small holes (ulcers) in the lining of the esophagus. What are the causes? This condition is caused by a problem with the muscle between the esophagus and the stomach. When this muscle is weak or not normal, it does not close properly to keep food and acid from coming back up from the stomach. The muscle can be weak because of: Tobacco use. Pregnancy. Having a certain type of hernia (hiatal hernia). Alcohol use. Certain foods and drinks, such as coffee, chocolate, onions, and peppermint. What  increases the risk? Being overweight. Having a disease that affects your connective tissue. Taking NSAIDs, such a ibuprofen. What are the signs or symptoms? Heartburn. Difficult or painful swallowing. The feeling of having a lump in the throat. A bitter taste in the mouth. Bad breath. Having a lot of saliva. Having an upset or bloated stomach. Burping. Chest pain. Different conditions can cause chest pain. Make sure you see your doctor if you have chest pain. Shortness of breath or wheezing. A long-term cough or a cough at night. Wearing away of the surface of teeth (tooth enamel). Weight loss. How is this treated? Making changes to your diet. Taking medicine. Having surgery. Treatment will depend on how bad your symptoms are. Follow these instructions at home: Eating and drinking  Follow a diet as told by your doctor. You may need to avoid foods and drinks such as: Coffee and tea, with or without caffeine. Drinks that contain alcohol. Energy drinks and sports drinks. Bubbly (carbonated) drinks or sodas. Chocolate and cocoa. Peppermint and mint flavorings. Garlic and onions. Horseradish. Spicy and acidic foods. These include peppers, chili powder, curry powder, vinegar, hot sauces, and BBQ sauce. Citrus fruit juices and citrus fruits, such as oranges, lemons, and limes. Tomato-based foods. These include red sauce, chili, salsa, and pizza with red sauce. Fried and fatty foods. These include donuts, french fries, potato chips, and high-fat dressings. High-fat meats. These include hot dogs, rib eye steak, sausage, ham, and bacon. High-fat dairy items, such as whole milk, butter, and cream cheese. Eat small meals often. Avoid eating large meals. Avoid drinking large amounts of liquid with your meals. Avoid eating  meals during the 2-3 hours before bedtime. Avoid lying down right after you eat. Do not exercise right after you eat. Lifestyle  Do not smoke or use any products  that contain nicotine or tobacco. If you need help quitting, ask your doctor. Try to lower your stress. If you need help doing this, ask your doctor. If you are overweight, lose an amount of weight that is healthy for you. Ask your doctor about a safe weight loss goal. General instructions Pay attention to any changes in your symptoms. Take over-the-counter and prescription medicines only as told by your doctor. Do not take aspirin, ibuprofen, or other NSAIDs unless your doctor says it is okay. Wear loose clothes. Do not wear anything tight around your waist. Raise (elevate) the head of your bed about 6 inches (15 cm). You may need to use a wedge to do this. Avoid bending over if this makes your symptoms worse. Keep all follow-up visits. Contact a doctor if: You have new symptoms. You lose weight and you do not know why. You have trouble swallowing or it hurts to swallow. You have wheezing or a cough that keeps happening. You have a hoarse voice. Your symptoms do not get better with treatment. Get help right away if: You have sudden pain in your arms, neck, jaw, teeth, or back. You suddenly feel sweaty, dizzy, or light-headed. You have chest pain or shortness of breath. You vomit and the vomit is green, yellow, or black, or it looks like blood or coffee grounds. You faint. Your poop (stool) is red, bloody, or black. You cannot swallow, drink, or eat. These symptoms may represent a serious problem that is an emergency. Do not wait to see if the symptoms will go away. Get medical help right away. Call your local emergency services (911 in the [place] Do not drive yourself to the hospital. Summary If a person has gastroesophageal reflux disease (GERD), food and stomach acid move back up into the esophagus and cause symptoms or problems such as damage to the esophagus. Treatment will depend on how bad your symptoms are. Follow a diet as told by your doctor. Take all medicines only as told  by your doctor. This information is not intended to replace advice given to you by your health care provider. Make sure you discuss any questions you have with your health care provider. Document Revised: 11/23/2019 Document Reviewed: 11/23/2019 Elsevier Patient Education  [place].

## 2021-04-12 NOTE — Telephone Encounter (Signed)
Forms faxed and copied and sent to scan.

## 2021-04-13 ENCOUNTER — Other Ambulatory Visit: Payer: Self-pay

## 2021-04-14 NOTE — Progress Notes (Signed)
Agree with assessment and plan as outlined. Once she has completed her cardiac workup will proceed with endoscopic evaluation.

## 2021-04-14 NOTE — Telephone Encounter (Signed)
Jodi Norman, I did discuss with the patient at the time of her office visit that she would need to completed her cardiac evaluation in order to obtain an official cardiac clearance prior to proceeding with any endoscopic evaluation. Pls call the patient advise her to contact her cardiologist to schedule an ECHO and follow up appt with Dr. Harriet Masson.  Patient to contact our office after cardiac clearance received. thx

## 2021-04-14 NOTE — Addendum Note (Signed)
Addended by: Orvan July on: 04/14/2021 10:43 AM   Modules accepted: Orders

## 2021-04-18 ENCOUNTER — Ambulatory Visit: Payer: Medicaid Other

## 2021-04-18 ENCOUNTER — Other Ambulatory Visit: Payer: Self-pay

## 2021-04-18 ENCOUNTER — Ambulatory Visit (INDEPENDENT_AMBULATORY_CARE_PROVIDER_SITE_OTHER): Payer: Medicaid Other | Admitting: *Deleted

## 2021-04-18 DIAGNOSIS — E538 Deficiency of other specified B group vitamins: Secondary | ICD-10-CM | POA: Diagnosis not present

## 2021-04-18 DIAGNOSIS — M5441 Lumbago with sciatica, right side: Secondary | ICD-10-CM | POA: Diagnosis not present

## 2021-04-18 DIAGNOSIS — G8929 Other chronic pain: Secondary | ICD-10-CM

## 2021-04-18 DIAGNOSIS — R29898 Other symptoms and signs involving the musculoskeletal system: Secondary | ICD-10-CM

## 2021-04-18 DIAGNOSIS — M6281 Muscle weakness (generalized): Secondary | ICD-10-CM

## 2021-04-18 DIAGNOSIS — M62838 Other muscle spasm: Secondary | ICD-10-CM

## 2021-04-18 DIAGNOSIS — R293 Abnormal posture: Secondary | ICD-10-CM

## 2021-04-18 MED ORDER — CYANOCOBALAMIN 1000 MCG/ML IJ SOLN
1000.0000 ug | Freq: Once | INTRAMUSCULAR | Status: AC
  Administered 2021-04-18: 1000 ug via INTRAMUSCULAR

## 2021-04-18 NOTE — Progress Notes (Addendum)
Pt here for monthly B12 injection per Elise Benne   B12 10100mcg given IM, left deltoid and pt tolerated injection well.   Next B12 injection scheduled for next week  Mackie Pai, PA-C

## 2021-04-18 NOTE — Therapy (Addendum)
PHYSICAL THERAPY DISCHARGE SUMMARY (05/09/21)  Visits from Start of Care: 2  Current functional level related to goals / functional outcomes: Unknown, see treatment note below   Remaining deficits: Unknown, see treatment note below    Education / Equipment: HEP  Plan: Patient goals were not met. Patient is being discharged due to attendance policy.  Patient only returned for 1 visit, then no-showed for 3 visits on 11/29, cancelled 11/30, no showed 12/6 and 12/7.     Rennie Natter, PT, DPT 05/09/2021   [facility] [address]  [place], [place], [address] Phone: [phone]   Fax:  [phone]  Physical Therapy Treatment  Patient Details  Name: Jodi Norman MRN: [id number] Date of Birth: 1995/09/03 Referring Provider (PT): Roma Schanz   Encounter Date: 04/18/2021   PT End of Session - 04/18/21 1821     Visit Number 2    Number of Visits 12    Date for PT Re-Evaluation 05/23/21    Authorization Type [employer] Medicaid    PT Start Time 1703    PT Stop Time 1745    PT Time Calculation (min) 42 min    Activity Tolerance Patient tolerated treatment well    Behavior During Therapy [facility] for tasks assessed/performed             Past Medical History:  Diagnosis Date   Generalized anxiety disorder 06/24/2020   GERD (gastroesophageal reflux disease)     Past Surgical History:  Procedure Laterality Date   CESAREAN SECTION      There were no vitals filed for this visit.   Subjective Assessment - 04/18/21 1704     Subjective Pt reports that she feels like she has no time to do HEP at home becuase she works all day and comes home to a toddler.    Pertinent History unknown possible metastatic disease, C-section, chronic back pain, mild scoliosis.    Diagnostic tests MRI done of thoracic and lumbar spine on 03/20/21-     Multifocal T1 and T2 hypointense marrow signal abnormality within  the  lumbar and sacral spine, as well as visualized pelvis,  corresponding with the sclerotic lesions present on the recent prior  PET-CT of 03/16/2021. These lesions demonstrate little, if any,  enhancement. While osseous metastatic disease is certainly a  differential consideration, these lesions remain indeterminate in  etiology.     Lumbar spondylosis, as outlined and with findings most notably as  follows.     At L4-L5, there is mild disc degeneration. Disc bulge. Superimposed  broad-based left center/subarticular disc protrusion    Patient Stated Goals get rid of my pain    Pain Score 6     Pain Location Back    Pain Orientation Lower    Pain Descriptors / Indicators Aching;Spasm    Pain Type Acute pain                [facility] PT Assessment - 04/18/21 0001       Observation/Other Assessments   Focus on Therapeutic Outcomes (FOTO)  Lumbar: 44%                           [facility] Adult PT Treatment/Exercise - 04/18/21 0001       Exercises   Exercises Lumbar      Lumbar Exercises: Stretches   Hip Flexor Stretch Right;Left;2 reps;20 seconds    Hip Flexor Stretch  Limitations seated    Pelvic Tilt 10 reps    Pelvic Tilt Limitations cues for proper movement    Other Lumbar Stretch Exercise 3 way prayer stretch in sitting 4x10" each      Lumbar Exercises: Aerobic   Recumbent Bike L2x66mn      Lumbar Exercises: Seated   Other Seated Lumbar Exercises ab set with orange Pball 10x3"      Manual Therapy   Manual Therapy Soft tissue mobilization    Soft tissue mobilization STM to B thoracolumbar parspinals                       PT Short Term Goals - 04/18/21 1829       PT SHORT TERM GOAL #1   Title complete FOTO within 2 visits.    Time 2    Period Weeks    Status Achieved   04/18/21   Target Date 04/25/21               PT Long Term Goals - 04/18/21 1829       PT LONG TERM GOAL #1   Title Patient will be independent with progressed HEP to  improve outcomes.    Time 6    Period Weeks    Status On-going      PT LONG TERM GOAL #2   Title Patient will report 75% improvement in severity/incidence of low back pain to improve QOL.    Baseline constant 7/10 all activities    Time 6    Period Weeks    Status On-going      PT LONG TERM GOAL #3   Title Patient will be able to stand for 30 minutes without increased LBP to complete ADLs/care for daughter.    Baseline 15-20 min before pain increases radiating down RLE    Time 6    Period Weeks    Status On-going      PT LONG TERM GOAL #4   Title Pt. will demonstrate improved functional strength by completing 10x STS in less than 15 seconds.    Baseline 5x in 18 seconds with definite fatigue    Time 6    Period Weeks    Status On-going                   Plan - 04/18/21 1822     Clinical Impression Statement Pt demonstrated increased APT at rest. She reported poor compliance with HEP due to her busy schedule, so I showed her seated versions of the exercises that she could do while at work. She needs more abdominal strengthening exercises to help decrease lumbar lordosis. Exercises were limited today due to high pain level. Pain was improved with some soft tissue work post session. She is very tight globally in the parapsinals, could benefit from some DN.    Personal Factors and Comorbidities Comorbidity 3+    Comorbidities history abdominal surgery, scoliosis, unknown metastatic disease, obesity    PT Frequency 2x / week    PT Duration 6 weeks    PT Treatment/Interventions ADLs/Self Care Home Management;Cryotherapy;Electrical Stimulation;Moist Heat;Traction;Ultrasound;Gait training;Functional mobility training;Therapeutic activities;Therapeutic exercise;Stair training;Balance training;Neuromuscular re-education;Manual techniques;Patient/family education;Passive range of motion;Dry needling;Taping;Spinal Manipulations;Joint Manipulations    PT Next Visit Plan review  progress HEP for neutral spine exercises, anterior rotation stretch, manual/modalities PRN    PT Home Exercise Plan Access Code: [email or web address]    Consulted and Agree with Plan of Care Patient  Patient will benefit from skilled therapeutic intervention in order to improve the following deficits and impairments:  Decreased endurance, Decreased mobility, Difficulty walking, Increased muscle spasms, Decreased range of motion, Increased edema, Improper body mechanics, Decreased activity tolerance, Decreased strength, Increased fascial restricitons, Impaired flexibility, Postural dysfunction, Pain  Visit Diagnosis: Chronic midline low back pain with right-sided sciatica  Muscle weakness (generalized)  Abnormal posture  Other muscle spasm  Other symptoms and signs involving the musculoskeletal system     Problem List Patient Active Problem List   Diagnosis Date Noted   History of scoliosis 03/06/2021   Chronic midline back pain 03/06/2021   Bone lesion 10/27/2020   Daytime somnolence 07/11/2020   Other fatigue 91/44/4584   Metabolic syndrome 83/50/7573   History of pre-eclampsia  07/11/2020   Obesity (BMI 30-39.9) 07/11/2020   Insomnia 07/08/2020   Intractable migraine without aura and without status migrainosus 07/08/2020   Chest pain 07/08/2020   Generalized anxiety disorder 06/24/2020   Carpal tunnel syndrome, bilateral 05/07/2018   Cesarean delivery delivered 04/28/2018   Right hand paresthesia 06/19/2017    Artist Pais, PTA 04/18/2021, 6:32 PM  [facility] [address]  [place], [place], [address] Phone: [phone]   Fax:  [phone]  Name: Jodi Norman MRN: [id number] Date of Birth: December 29, 1995

## 2021-04-25 ENCOUNTER — Ambulatory Visit: Payer: Medicaid Other | Admitting: Physical Therapy

## 2021-04-25 ENCOUNTER — Ambulatory Visit: Payer: Medicaid Other

## 2021-04-26 ENCOUNTER — Ambulatory Visit: Payer: Medicaid Other

## 2021-04-26 ENCOUNTER — Other Ambulatory Visit: Payer: Self-pay | Admitting: Radiology

## 2021-04-27 ENCOUNTER — Ambulatory Visit (HOSPITAL_COMMUNITY)
Admission: RE | Admit: 2021-04-27 | Discharge: 2021-04-27 | Disposition: A | Discharge status: Home / Self Care | Payer: Medicaid Other | Source: Ambulatory Visit | Attending: Hematology & Oncology | Admitting: Hematology & Oncology

## 2021-04-27 ENCOUNTER — Encounter (HOSPITAL_COMMUNITY): Payer: Self-pay

## 2021-04-27 ENCOUNTER — Other Ambulatory Visit (HOSPITAL_COMMUNITY): Payer: Medicaid Other

## 2021-04-27 ENCOUNTER — Ambulatory Visit (INDEPENDENT_AMBULATORY_CARE_PROVIDER_SITE_OTHER): Payer: Medicaid Other

## 2021-04-27 ENCOUNTER — Other Ambulatory Visit: Payer: Self-pay

## 2021-04-27 DIAGNOSIS — R5383 Other fatigue: Secondary | ICD-10-CM | POA: Diagnosis not present

## 2021-04-27 DIAGNOSIS — D649 Anemia, unspecified: Secondary | ICD-10-CM | POA: Insufficient documentation

## 2021-04-27 DIAGNOSIS — R0602 Shortness of breath: Secondary | ICD-10-CM

## 2021-04-27 DIAGNOSIS — R590 Localized enlarged lymph nodes: Secondary | ICD-10-CM | POA: Diagnosis present

## 2021-04-27 DIAGNOSIS — R079 Chest pain, unspecified: Secondary | ICD-10-CM | POA: Insufficient documentation

## 2021-04-27 DIAGNOSIS — C7951 Secondary malignant neoplasm of bone: Secondary | ICD-10-CM | POA: Diagnosis not present

## 2021-04-27 HISTORY — PX: UPPER GI ENDOSCOPY: SHX6162

## 2021-04-27 LAB — ECHOCARDIOGRAM COMPLETE
AR max vel: 2.25 cm2
AV Area VTI: 2.22 cm2
AV Area mean vel: 2.15 cm2
AV Mean grad: 5 mmHg
AV Peak grad: 10.1 mmHg
Ao pk vel: 1.59 m/s
Area-P 1/2: 4.06 cm2
Calc EF: 64.6 %
Height: 67 in
S' Lateral: 3.11 cm
Single Plane A2C EF: 61.7 %
Single Plane A4C EF: 66.9 %
Weight: 3680 oz

## 2021-04-27 LAB — CBC
HCT: 30.8 % — ABNORMAL LOW (ref 36.0–46.0)
Hemoglobin: 10 g/dL — ABNORMAL LOW (ref 12.0–15.0)
MCH: 26.9 pg (ref 26.0–34.0)
MCHC: 32.5 g/dL (ref 30.0–36.0)
MCV: 82.8 fL (ref 80.0–100.0)
Platelets: 257 10*3/uL (ref 150–400)
RBC: 3.72 MIL/uL — ABNORMAL LOW (ref 3.87–5.11)
RDW: 14.4 % (ref 11.5–15.5)
WBC: 5.7 10*3/uL (ref 4.0–10.5)
nRBC: 0 % (ref 0.0–0.2)

## 2021-04-27 LAB — PROTIME-INR
INR: 1 (ref 0.8–1.2)
Prothrombin Time: 12.9 seconds (ref 11.4–15.2)

## 2021-04-27 LAB — PREGNANCY, URINE: Preg Test, Ur: NEGATIVE

## 2021-04-27 IMAGING — US US BIOPSY LYMPH NODE
1 series · 13 of 19 positions shown · non-contrast
Comparison: none

INDICATION: 25-year-old woman with FDG avid right supraclavicular lymph node
presents to IR for ultrasound-guided biopsy.

[Series 1: us core biopsy (lymph nodes) · 19 acquisitions, 13 frames shown]
[im 1/19]
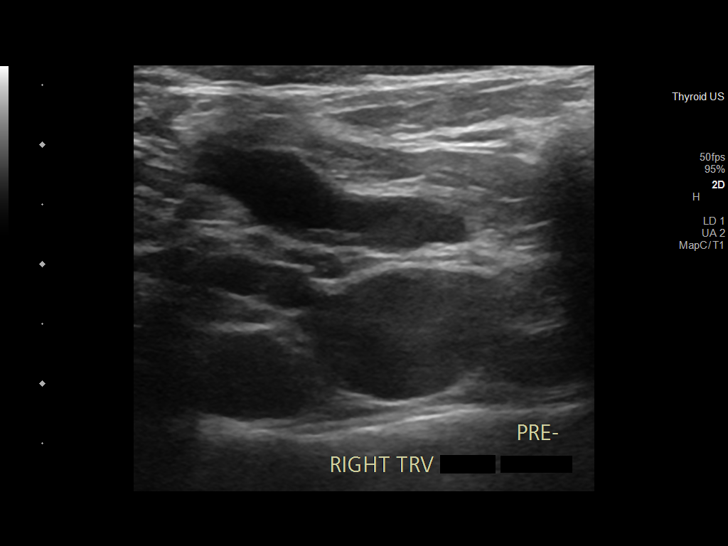
[im 3/19]
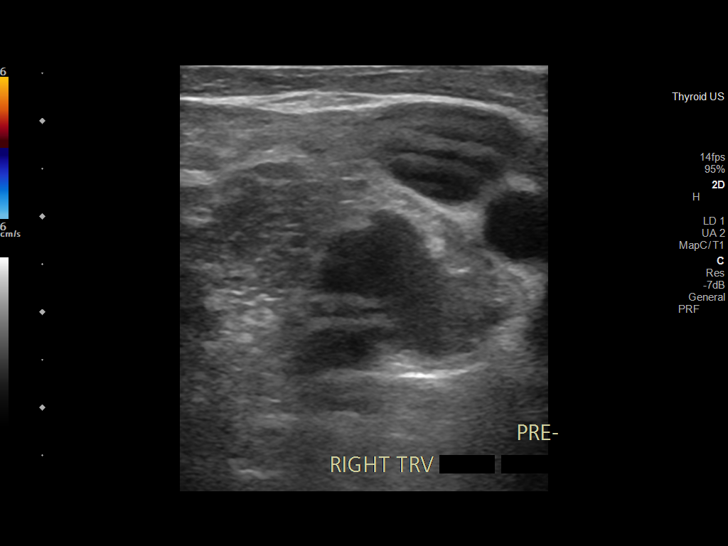
[im 4/19]
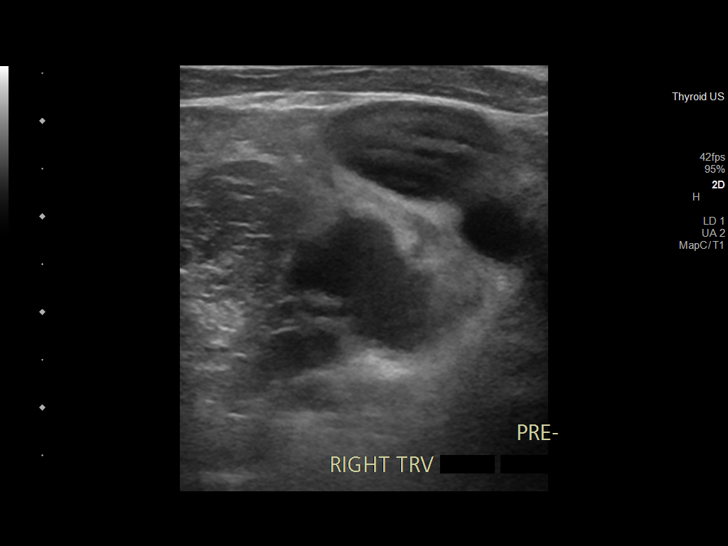
[im 6/19]
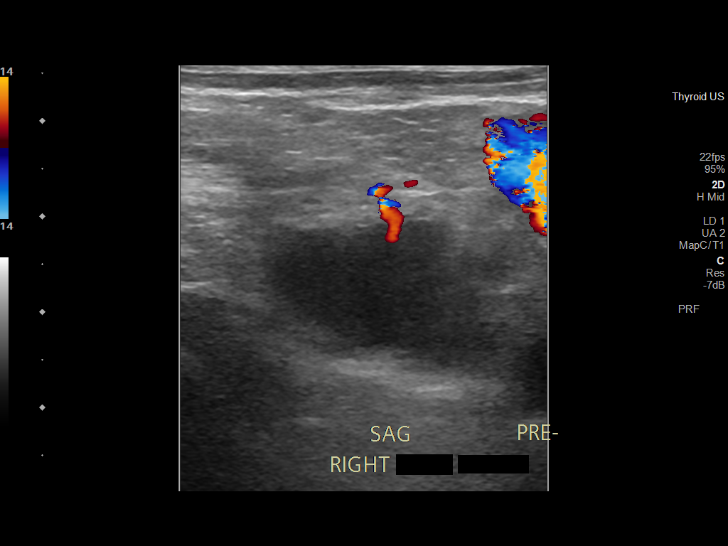
[im 7/19]
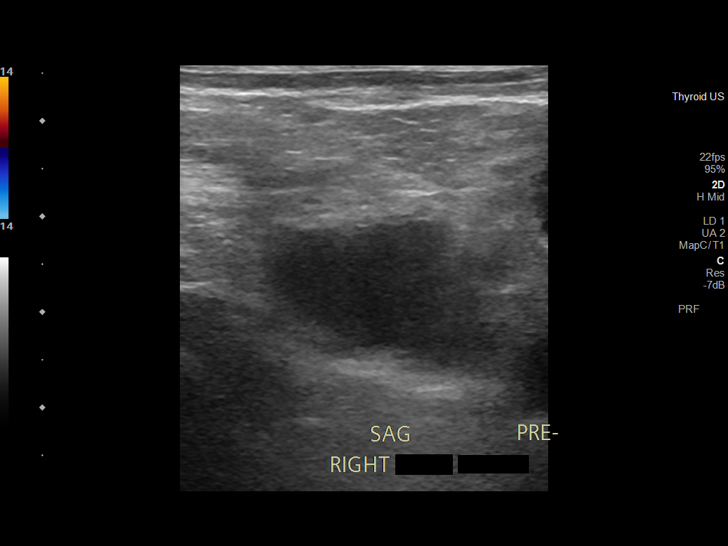
[im 9/19]
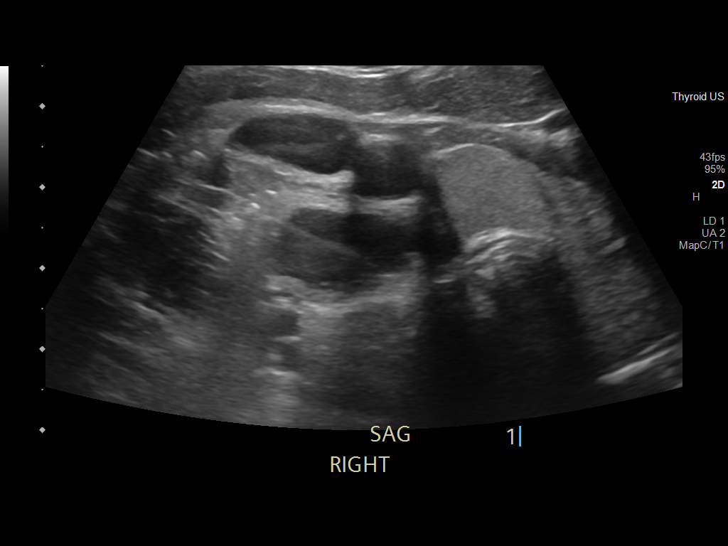
[im 10/19]
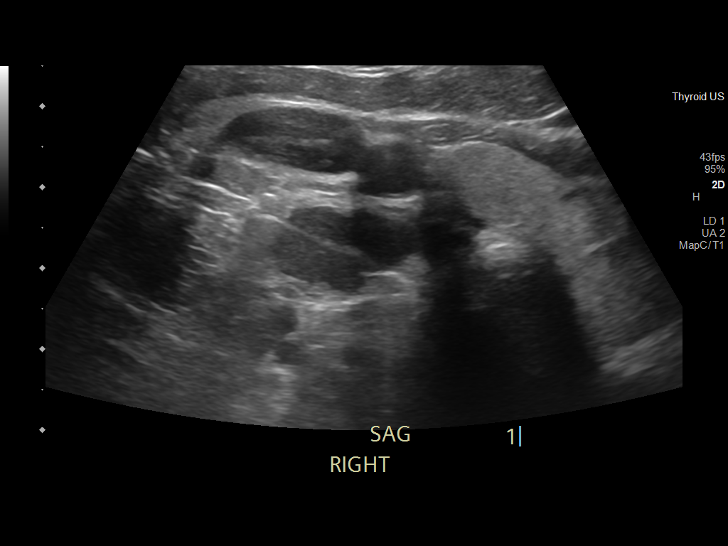
[im 11/19]
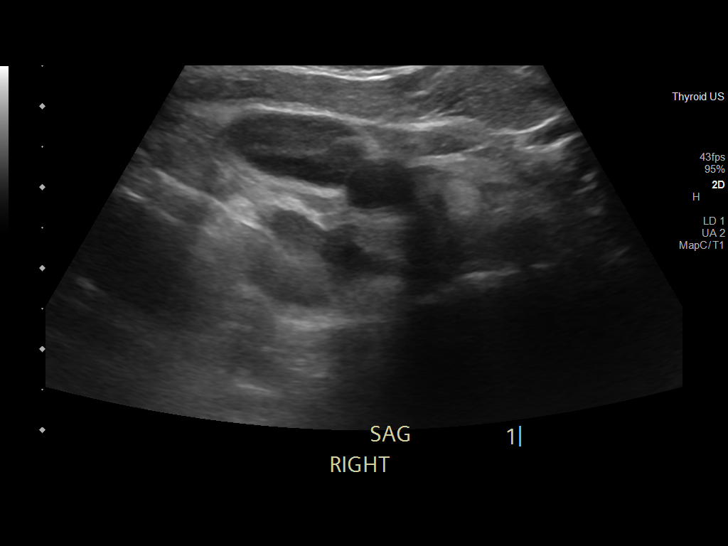
[im 13/19]
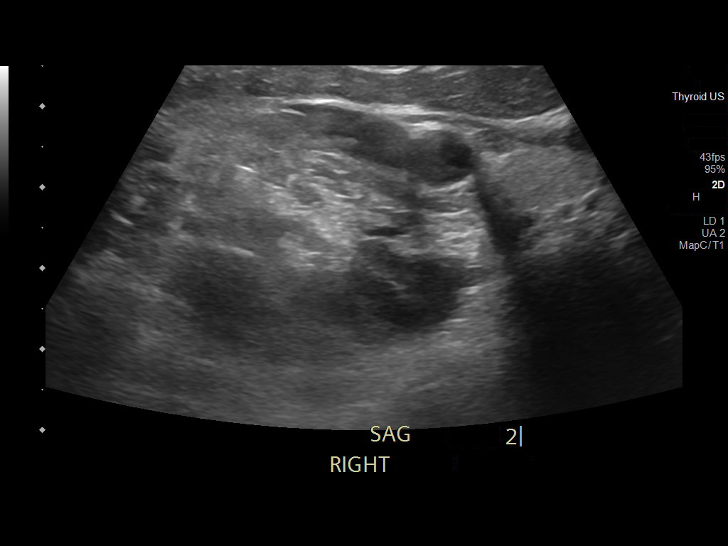
[im 14/19]
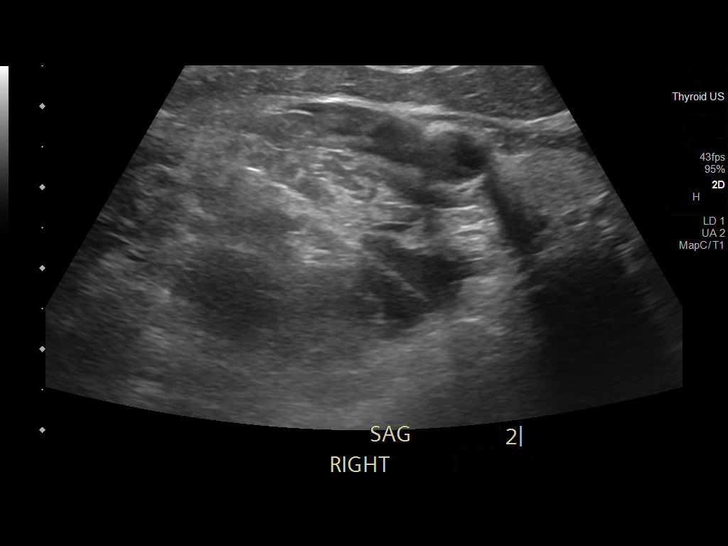
[im 16/19]
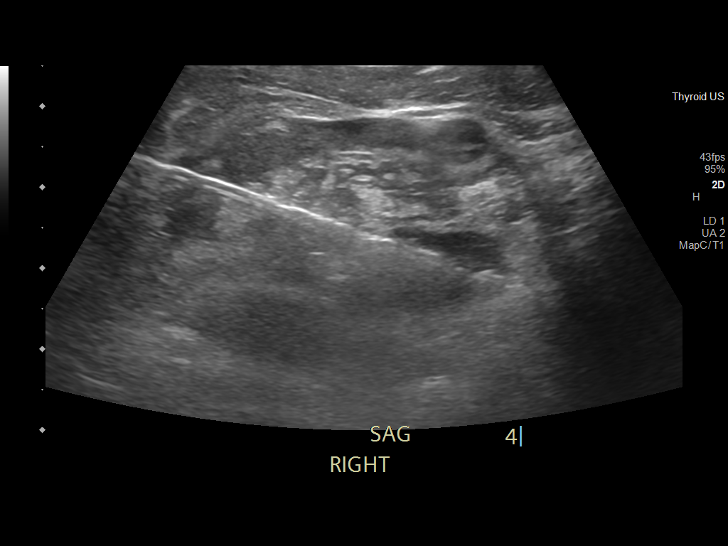
[im 17/19]
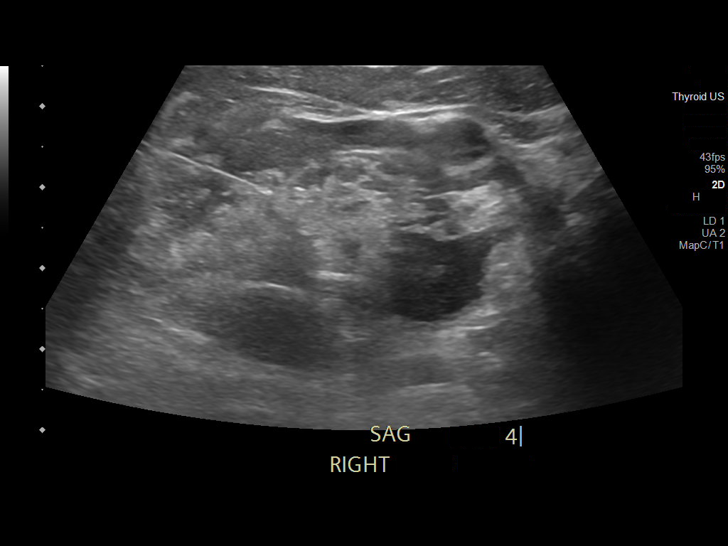
[im 19/19]
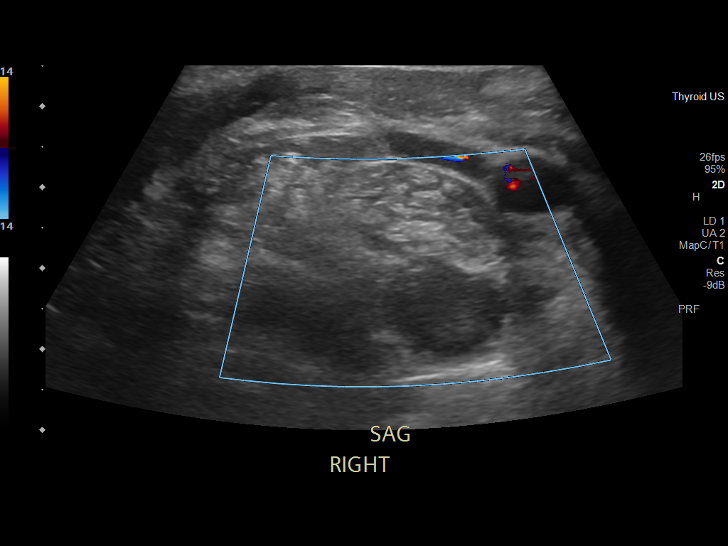

[13 of 19 positions shown; findings below may reference images not displayed]

EXAM:
ULTRASOUND GUIDED CORE NEEDLE BIOPSY OF FDG AVID RIGHT
SUPRACLAVICULAR LYMPH NODE

MEDICATIONS:
None.

ANESTHESIA/SEDATION:
Fentanyl 100 mcg IV; Versed 2 mg IV

Moderate Sedation Time:  11 minutes

The patient was continuously monitored during the procedure by the
interventional radiology nurse under my direct supervision.

PROCEDURE:
The procedure, risks, benefits, and alternatives were explained to
the patient. Questions regarding the procedure were encouraged and
answered. The patient understands and consents to the procedure.

The right neck skin was prepped with chlorhexidine in a sterile
fashion, and a sterile drape was applied covering the operative
field. A sterile gown and sterile gloves were used for the
procedure. Local anesthesia was provided with 1% Lidocaine.

Following local lidocaine administration, four 18 gauge cores were
obtained from the enlarged right supraclavicular lymph node
utilizing continuous ultrasound guidance.

Samples were sent to pathology in sterile saline.

Needle removed and hemostasis achieved with 5 minutes of manual
compression.

Post procedure ultrasound images showed no evidence of significant
hemorrhage.

COMPLICATIONS:
None immediate.
IMPRESSION: Ultrasound-guided core needle biopsy of FDG avid right
supraclavicular lymph node.

## 2021-04-27 MED ORDER — FENTANYL CITRATE (PF) 100 MCG/2ML IJ SOLN
INTRAMUSCULAR | Status: AC | PRN
  Administered 2021-04-27 (×2): 50 ug via INTRAVENOUS

## 2021-04-27 MED ORDER — FENTANYL CITRATE (PF) 100 MCG/2ML IJ SOLN
INTRAMUSCULAR | Status: AC
  Filled 2021-04-27: qty 2

## 2021-04-27 MED ORDER — ACETAMINOPHEN 325 MG PO TABS
650.0000 mg | ORAL_TABLET | Freq: Once | ORAL | Status: AC
  Administered 2021-04-27: 650 mg via ORAL
  Filled 2021-04-27: qty 2

## 2021-04-27 MED ORDER — SODIUM CHLORIDE 0.9 % IV SOLN: INTRAVENOUS | Status: DC

## 2021-04-27 MED ORDER — MIDAZOLAM HCL 2 MG/2ML IJ SOLN
INTRAMUSCULAR | Status: AC
  Filled 2021-04-27: qty 2

## 2021-04-27 MED ORDER — LIDOCAINE HCL (PF) 1 % IJ SOLN
INTRAMUSCULAR | Status: AC
  Filled 2021-04-27: qty 30

## 2021-04-27 MED ORDER — MIDAZOLAM HCL 2 MG/2ML IJ SOLN
INTRAMUSCULAR | Status: AC | PRN
  Administered 2021-04-27 (×2): 1 mg via INTRAVENOUS

## 2021-04-27 NOTE — Procedures (Signed)
Interventional Radiology Procedure Note  Procedure: US guided right supraclavicular lymph node biopsy  Indication: FDG avid enlarged right neck lymph node  Findings: Please refer to procedural dictation for full description.  Complications: None  EBL: < 10 mL  Miachel Roux, MD [phone]

## 2021-04-27 NOTE — H&P (Signed)
Chief Complaint: Patient was seen in consultation today for right supraclavicular node biopsy at the request of Ennever,Peter R  Referring Physician(s): Ennever,Peter R  Supervising Physician: Juliet Rude  Patient Status: [facility] - Out-pt  History of Present Illness: Jodi Norman is a 25 y.o. female   Pt has known of spinal metastasis since 05/2020 Denies pain - but had complained of sob and chest pain CTA discovered osseous sclerotic metastatic disease Work up ensued  MRI 06/2020:  1. 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 No pathologic fracture or spinal canal compromise.  Bone marrow biopsy 07/22/20: BONE MARROW, ASPIRATE, CLOT, CORE:  - Normocellular marrow with rare non-caseating granuloma, see comment.  - Benign appearing lymphoid aggregates.  PERIPHERAL BLOOD:  -  Mild normocytic anemia.   PET 02/2021:  for strategy for cancer of unknown primary with osseous metastatic disease 1. In general the sclerotic bony lesions appear to have less metabolic activity than the normal appearing non sclerotic bone, presumably indicating effective therapy. 2. Various regions of accentuated metabolic activity including the bilateral palatine tonsils, posterior nasopharynx, glottis, distal esophagus, some bowel loops, endometrium, and ovaries. Much of this is likely physiologic given the distribution and bilaterally. Given the new findings along the endometrium and ovaries I would suggest pelvic sonography. Moreover, given the presence of the mildly enlarged and mildly hypermetabolic right supraclavicular lymph node and the low-grade activity in a borderline enlarged left level IIa neck lymph node, assessment of the palatine tonsils and glottis might be warranted.  Scheduled now for Right supraclavicular node biopsy per Dr Marin Olp Pt denies back pain Denies complaints regarding LAN     Past Medical History:  Diagnosis Date   Generalized anxiety  disorder 06/24/2020   GERD (gastroesophageal reflux disease)     Past Surgical History:  Procedure Laterality Date   CESAREAN SECTION      Allergies: Patient has no known allergies.  Medications: Prior to Admission medications   Medication Sig Start Date End Date Taking? Authorizing Provider  clonazePAM (KLONOPIN) 0.5 MG tablet Take 1 tablet (0.5 mg total) by mouth 2 (two) times daily as needed for anxiety. 11/17/20  Yes Roma Schanz R, DO  doxycycline (VIBRAMYCIN) 100 MG capsule Take 100 mg by mouth daily. 04/04/21  Yes [provider]  escitalopram (LEXAPRO) 10 MG tablet Take 10 mg by mouth daily.   Yes [provider]  ibuprofen (ADVIL) 200 MG tablet Take 400-800 mg by mouth every 6 (six) hours as needed for moderate pain, headache or cramping.   Yes [provider]  omeprazole (PRILOSEC) 20 MG capsule Take 1 capsule (20 mg total) by mouth 2 (two) times daily before a meal. 04/12/21  Yes Kennedy-Smith, Patrecia Pour, NP  traMADol-acetaminophen (ULTRACET) 37.5-325 MG tablet Take 1 tablet by mouth every 6 (six) hours as needed. 03/29/21  Yes Jessy Oto, MD  amitriptyline (ELAVIL) 10 MG tablet Take 10 mg by mouth at bedtime as needed for sleep. 12/28/20   [provider]  Iron, Ferrous Sulfate, 325 (65 Fe) MG TABS Take 325 mg by mouth daily. 03/29/21   Saguier, Percell Miller, PA-C  ondansetron (ZOFRAN ODT) 8 MG disintegrating tablet Take 1 tablet (8 mg total) by mouth every 8 (eight) hours as needed for nausea or vomiting. 04/06/21   Molpus, John, MD  Vitamin D, Ergocalciferol, (DRISDOL) 1.25 MG (50000 UNIT) CAPS capsule Take 1 capsule (50,000 Units total) by mouth every 7 (seven) days. 11/17/20   Ann Held, DO  Family History  Problem Relation Age of Onset   Hyperlipidemia Mother    Hypertension Mother    Hyperlipidemia Father    Hypertension Father    Diabetes Father    Colon cancer Neg Hx    Stomach cancer Neg Hx    Esophageal cancer  Neg Hx    Pancreatic cancer Neg Hx     Social History   Socioeconomic History   Marital status: Single    Spouse name: Not on file   Number of children: Not on file   Years of education: Not on file   Highest education level: Not on file  Occupational History   Not on file  Tobacco Use   Smoking status: Never   Smokeless tobacco: Never  Vaping Use   Vaping Use: Never used  Substance and Sexual Activity   Alcohol use: Never   Drug use: Never   Sexual activity: Not Currently    Birth control/protection: None  Other Topics Concern   Not on file  Social History Narrative   Not on file   Social Determinants of Health   Financial Resource Strain: Not on file  Food Insecurity: Not on file  Transportation Needs: Not on file  Physical Activity: Not on file  Stress: Not on file  Social Connections: Not on file    Review of Systems: A 12 point ROS discussed and pertinent positives are indicated in the HPI above.  All other systems are negative.  Review of Systems  Constitutional:  Negative for activity change, fatigue, fever and unexpected weight change.  Respiratory:  Negative for cough and shortness of breath.   Cardiovascular:  Negative for chest pain.  Gastrointestinal:  Negative for abdominal pain.  Musculoskeletal:  Negative for back pain.  Neurological:  Negative for weakness.  Psychiatric/Behavioral:  Negative for behavioral problems and confusion.    Vital Signs: BP 127/80   Pulse 84   Temp 97.9 F (36.6 C) (Oral)   Resp 16   Ht 5' 7"  (1.702 m)   Wt 230 lb (104.3 kg)   LMP 03/29/2021 (Approximate)   SpO2 100%   BMI 36.02 kg/m   Physical Exam Vitals reviewed.  HENT:     Mouth/Throat:     Mouth: Mucous membranes are moist.  Cardiovascular:     Rate and Rhythm: Normal rate and regular rhythm.     Heart sounds: Normal heart sounds.  Pulmonary:     Effort: Pulmonary effort is normal.     Breath sounds: Normal breath sounds.  Abdominal:      Palpations: Abdomen is soft.  Musculoskeletal:        General: Normal range of motion.  Skin:    General: Skin is warm.  Neurological:     Mental Status: She is alert and oriented to person, place, and time.  Psychiatric:        Behavior: Behavior normal.    Imaging: DG BONE DENSITY (DXA)  Result Date: 04/03/2021 EXAM: DUAL X-RAY ABSORPTIOMETRY (DXA) FOR BONE MINERAL DENSITY IMPRESSION: Jessy Oto, Your patient Jodi Norman completed a BMD test on 04/03/2021 using the [place] (analysis version: 16.SP2) manufactured by [employer]. The following summarizes the results of our evaluation. [place] PATIENT: Name: Jodi Norman, Jodi Norman Patient ID: [id number] Birth Date: 15-Apr-1996 Height: 67.0 in. Gender: Female Measured: 04/03/2021 Weight: 243.6 lbs. Indications: African-American, Pre Menopausal, Vitamin D Deficiency Fractures: Treatments: Multivitamin, Vitamin D ASSESSMENT: The BMD measured at Forearm Radius 33% is 0.866 g/cm2 with a Z-score of -  0.1.The Z-score is within the expected range for age. The ISCD recommends using Z-scores for assessment of pre-menopausal women, men between 57 and 49 yoa, and children under 14 yoa. The diagnosis of osteoporosis in these patients should not be made on the basis of densitometric criteria alone. Site         Region     Measured Date Measured Age AM      BMD Z-score Left Forearm Radius 33% 04/03/2021 24.9 -0.1 0.866 g/cm2 AP Spine L1-L4 (L2) 04/03/2021 24.9 0.9 1.277 g/cm2 DualFemur Neck Left 04/03/2021 24.9 0.0 1.035 g/cm2 RECOMMENDATION:All patients should ensure an adequate intake of dietary calcium (1200 mg/d) and vitamin D (800 IU daily) unless contraindicated. FOLLOW-UP: Patients with diagnosis of osteoporosis or at high risk for fracture should have regular bone mineral density tests. For patients eligible for Medicare, routine testing is allowed once every 2 years. The testing frequency can be increased to one year for patients who have rapidly  progressing disease, those who are receiving or discontinuing medical therapy to restore bone mass, or have additional risk factors. I have reviewed this report, and agree with the above findings. [facility] Radiology Electronically Signed   By: Elmer Picker M.D.   On: 04/03/2021 09:27    Labs:  CBC: Recent Labs    02/28/21 1935 03/06/21 0906 03/28/21 1646 04/27/21 0632  WBC 7.1 5.3 5.7 5.7  HGB 10.6* 10.5* 10.6* 10.0*  HCT 32.0* 31.5* 32.0* 30.8*  PLT 267 256 283.0 257    COAGS: Recent Labs    07/22/20 0710 04/27/21 0632  INR 1.0 1.0    BMP: Recent Labs    08/03/20 0916 01/07/21 1817 02/28/21 1935 03/06/21 0906  NA 138 140 135 138  K 3.6 4.0 3.8 4.1  CL 107 106 103 104  CO2 25 26 25 27   GLUCOSE 88 98 100* 90  BUN <5* 7 11 9   CALCIUM 9.2 8.9 9.1 9.6  CREATININE 0.65 0.65 0.75 0.73  GFRNONAA >60 >60 >60 >60    LIVER FUNCTION TESTS: Recent Labs    07/28/20 0905 08/03/20 0916 01/07/21 1817 03/06/21 0906  BILITOT 0.3 0.2* 0.3 0.3  AST 13 13* 19 14*  ALT 9 9 18 9   ALKPHOS 101 95 99 90  PROT 7.8 7.2 7.5 7.3  ALBUMIN 4.3 4.2 3.8 4.3    TUMOR MARKERS: No results for input(s): AFPTM, CEA, CA199, CHROMGRNA in the last 8760 hours.  Assessment and Plan:  Strategy for cancer of unknown primary with osseous metastatic disease PET + 02/2021 R SCLN Scheduled for biopsy of same Risks and benefits of right supraclavicular lymph node biopsy was discussed with the patient and/or patient's family including, but not limited to bleeding, infection, damage to adjacent structures or low yield requiring additional tests.  All of the questions were answered and there is agreement to proceed. Consent signed and in chart.    Thank you for this interesting consult.  I greatly enjoyed meeting Jodi Norman and look forward to participating in their care.  A copy of this report was sent to the requesting provider on this date.  Electronically Signed: Lavonia Drafts,  PA-C 04/27/2021, 7:25 AM   I spent a total of  30 Minutes   in face to face in clinical consultation, greater than 50% of which was counseling/coordinating care for R SCLN bx

## 2021-04-27 NOTE — Progress Notes (Signed)
Patient reports 6/10 pain at surgical puncture site to right neck and states "the numbing has worn off. I have a very low pain tolerance." This RN palpated patient's right neck and did not feel hematoma or any abnormality. No erythema or discharge to surgical site. Patient given 650 tylenol per NP Soyla Dryer order and instructed to alternate tylenol/ibuprofen at home per her home med list. Patient given ice pack prior to discharge and sent home in stable condition. Instructed to call MD if pain is not relieved by tylenol/ibuprofen PRN. All questions answered and work note given to patient.

## 2021-04-28 ENCOUNTER — Other Ambulatory Visit (HOSPITAL_BASED_OUTPATIENT_CLINIC_OR_DEPARTMENT_OTHER): Payer: Medicaid Other

## 2021-05-01 LAB — SURGICAL PATHOLOGY

## 2021-05-02 ENCOUNTER — Ambulatory Visit: Payer: Medicaid Other | Admitting: Family Medicine

## 2021-05-02 ENCOUNTER — Ambulatory Visit: Payer: Medicaid Other | Attending: Family Medicine | Admitting: Physical Therapy

## 2021-05-03 ENCOUNTER — Telehealth: Payer: Self-pay | Admitting: *Deleted

## 2021-05-03 ENCOUNTER — Encounter: Payer: Medicaid Other | Admitting: Physical Therapy

## 2021-05-03 NOTE — Telephone Encounter (Signed)
-----   Message from Lauralee Evener, [place] sent at 05/03/2021 10:17 AM EST -----  ----- Message ----- From: Lauralee Evener, CMA Sent: 05/03/2021   9:47 AM EST To: Lauralee Evener, CMA  Split night sleep study submitted to [facility]. Pending Auth # [id number].

## 2021-05-03 NOTE — Telephone Encounter (Signed)
-----   Message from Orvan July, RN sent at 05/01/2021  7:38 AM EST ----- Needs precert for sleep study.  Thank you,    [facility]

## 2021-05-03 NOTE — Telephone Encounter (Signed)
Prior Authorization for split night sleep study sent to [facility] via web portal. Tracking Number [id number].

## 2021-05-09 ENCOUNTER — Encounter: Payer: Medicaid Other | Admitting: Physical Therapy

## 2021-05-09 NOTE — Telephone Encounter (Signed)
Her echo was normal. She did not have an office visit, but I send a message letting her know she can move forward with her GI procedure

## 2021-05-09 NOTE — Telephone Encounter (Signed)
Hi Dr. Harriet Masson,  Patient states she has her ECHO and follow up with you 04/27/21 and cleared to proceed. I am unable to locate the notes in Epic. Please advise if she is cleared. Thank you!

## 2021-05-10 ENCOUNTER — Telehealth: Payer: Self-pay | Admitting: Nurse Practitioner

## 2021-05-10 NOTE — Telephone Encounter (Signed)
Patient called today very upset because no one has reached out to her about scheduling an EGD.  She got cardiac clearance on 12/2 and says that as of yet, no one has contacted her.  Please give patient a call.  Thank you.

## 2021-05-11 ENCOUNTER — Encounter: Payer: Medicaid Other | Admitting: Physical Therapy

## 2021-05-11 NOTE — Telephone Encounter (Signed)
Melissa I think you were waiting for a message from [facility]. Re-route that message to Dr Havery Moros so we can get this patient taken care of. Thanks

## 2021-05-11 NOTE — Telephone Encounter (Signed)
Dr. Havery Moros,  Jaclyn Shaggy had wanted the patient to be seen by cardiology and have and ECHO. Per Cardiology, she did not have an office visit but ECHO was normal. Please advise if she should still see cardiology or if clear to proceed in [place] or at the hospital.

## 2021-05-11 NOTE — Telephone Encounter (Signed)
Okay to schedule at the [facility] - she needs both EGD and colonoscopy for iron deficiency anemia, B12 deficiency, and her symptoms. Thanks

## 2021-05-12 ENCOUNTER — Other Ambulatory Visit: Payer: Self-pay

## 2021-05-12 DIAGNOSIS — K219 Gastro-esophageal reflux disease without esophagitis: Secondary | ICD-10-CM

## 2021-05-12 DIAGNOSIS — G4700 Insomnia, unspecified: Secondary | ICD-10-CM

## 2021-05-12 DIAGNOSIS — R5383 Other fatigue: Secondary | ICD-10-CM

## 2021-05-12 DIAGNOSIS — D509 Iron deficiency anemia, unspecified: Secondary | ICD-10-CM

## 2021-05-12 DIAGNOSIS — E538 Deficiency of other specified B group vitamins: Secondary | ICD-10-CM

## 2021-05-12 DIAGNOSIS — K625 Hemorrhage of anus and rectum: Secondary | ICD-10-CM

## 2021-05-12 DIAGNOSIS — R400 Somnolence: Secondary | ICD-10-CM

## 2021-05-12 DIAGNOSIS — K5900 Constipation, unspecified: Secondary | ICD-10-CM

## 2021-05-12 MED ORDER — SUPREP BOWEL PREP KIT 17.5-3.13-1.6 GM/177ML PO SOLN: 1.0000 | ORAL | 0 refills | Status: DC

## 2021-05-12 NOTE — Telephone Encounter (Signed)
Spoke to patient has scheduled for a double in Feb with Dr.Armbruster.  While speaking to the patient, she explained she just got a new job that starts in January. Her current insurance ends May 28 2021 and will not have her new insurance until her 3 month probationary period ends at her new job so she will be uninsured. Patient also can not take time off during her probation period.  Patient is wondering if another provider would be willing to do the double this month. I let patient know I would need to obtain permission from the doctors and look to see if there were any openings and if we could accommodate. As of right now the doctors who have an open double this month are:  Dr. Ardis Hughs 12/23 Dr. Tarri Glenn 12/27   Please advise if we can get her in before end of the year. Thank you for all your help.

## 2021-05-12 NOTE — Telephone Encounter (Signed)
Thank you Dr. Ardis Hughs, I have her scheduled with you 12/23.  I will send in the prep and contact patient with instructions.

## 2021-05-12 NOTE — Telephone Encounter (Signed)
Received a denial from [facility] for in lab split night slep study. No co morbidities. Will notify ordering provider she can resubmit for HST or file appeal by calling [phone].

## 2021-05-12 NOTE — Telephone Encounter (Signed)
Spoke to patient and let her know to pick up the prep today or tomorrow to ensure that there are no issues getting in. I went over the instructions with her, she expressed understanding and agreement. I let her know all instructions have been sent to her on MyChart as well for her to review. Patient expressed appreciation and was very happy we could get her in and I wished her good luck on her new job next month.

## 2021-05-12 NOTE — Telephone Encounter (Signed)
Home sleep study order placed

## 2021-05-12 NOTE — Progress Notes (Signed)
Home sleep study order placed

## 2021-05-16 ENCOUNTER — Encounter: Payer: Medicaid Other | Admitting: Physical Therapy

## 2021-05-17 NOTE — Telephone Encounter (Signed)
Patient is already set up for 12/23.

## 2021-05-18 ENCOUNTER — Encounter: Payer: Self-pay | Admitting: Certified Registered Nurse Anesthetist

## 2021-05-19 ENCOUNTER — Encounter: Payer: Self-pay | Admitting: Gastroenterology

## 2021-05-19 ENCOUNTER — Ambulatory Visit (AMBULATORY_SURGERY_CENTER): Payer: Medicaid Other | Admitting: Gastroenterology

## 2021-05-19 VITALS — BP 137/93 | HR 84 | Temp 97.5°F | Resp 12 | Ht 67.0 in | Wt 241.0 lb

## 2021-05-19 DIAGNOSIS — Z8719 Personal history of other diseases of the digestive system: Secondary | ICD-10-CM

## 2021-05-19 DIAGNOSIS — K5909 Other constipation: Secondary | ICD-10-CM

## 2021-05-19 DIAGNOSIS — K2100 Gastro-esophageal reflux disease with esophagitis, without bleeding: Secondary | ICD-10-CM

## 2021-05-19 DIAGNOSIS — D509 Iron deficiency anemia, unspecified: Secondary | ICD-10-CM

## 2021-05-19 DIAGNOSIS — K625 Hemorrhage of anus and rectum: Secondary | ICD-10-CM

## 2021-05-19 DIAGNOSIS — K449 Diaphragmatic hernia without obstruction or gangrene: Secondary | ICD-10-CM | POA: Diagnosis not present

## 2021-05-19 MED ORDER — SODIUM CHLORIDE 0.9 % IV SOLN: 500.0000 mL | Freq: Once | INTRAVENOUS | Status: DC

## 2021-05-19 MED ORDER — OMEPRAZOLE 40 MG PO CPDR: 40.0000 mg | DELAYED_RELEASE_CAPSULE | Freq: Two times a day (BID) | ORAL | 3 refills | Status: DC

## 2021-05-19 NOTE — Patient Instructions (Addendum)
Information on hiatal hernias and esophagitis given to you today. Resume previous diet and medications.  Start Omeprazole 40 mg twice a day - once at breakfast and once at dinner.      YOU HAD AN ENDOSCOPIC PROCEDURE TODAY AT [facility]:   Refer to the procedure report that was given to you for any specific questions about what was found during the examination.  If the procedure report does not answer your questions, please call your gastroenterologist to clarify.  If you requested that your care partner not be given the details of your procedure findings, then the procedure report has been included in a sealed envelope for you to review at your convenience later.  YOU SHOULD EXPECT: Some feelings of bloating in the abdomen. Passage of more gas than usual.  Walking can help get rid of the air that was put into your GI tract during the procedure and reduce the bloating. If you had a lower endoscopy (such as a colonoscopy or flexible sigmoidoscopy) you may notice spotting of blood in your stool or on the toilet paper. If you underwent a bowel prep for your procedure, you may not have a normal bowel movement for a few days.  Please Note:  You might notice some irritation and congestion in your nose or some drainage.  This is from the oxygen used during your procedure.  There is no need for concern and it should clear up in a day or so.  SYMPTOMS TO REPORT IMMEDIATELY:  Following lower endoscopy (colonoscopy or flexible sigmoidoscopy):  Excessive amounts of blood in the stool  Significant tenderness or worsening of abdominal pains  Swelling of the abdomen that is new, acute  Fever of 100F or higher  Following upper endoscopy (EGD)  Vomiting of blood or coffee ground material  New chest pain or pain under the shoulder blades  Painful or persistently difficult swallowing  New shortness of breath  Fever of 100F or higher  Black, tarry-looking stools  For urgent or emergent  issues, a gastroenterologist can be reached at any hour by calling [phone]. Do not use MyChart messaging for urgent concerns.    DIET:  We do recommend a small meal at first, but then you may proceed to your regular diet.  Drink plenty of fluids but you should avoid alcoholic beverages for 24 hours.  ACTIVITY:  You should plan to take it easy for the rest of today and you should NOT DRIVE or use heavy machinery until tomorrow (because of the sedation medicines used during the test).    FOLLOW UP: Our staff will call the number listed on your records 48-72 hours following your procedure to check on you and address any questions or concerns that you may have regarding the information given to you following your procedure. If we do not reach you, we will leave a message.  We will attempt to reach you two times.  During this call, we will ask if you have developed any symptoms of COVID 19. If you develop any symptoms (ie: fever, flu-like symptoms, shortness of breath, cough etc.) before then, please call [phone].  If you test positive for Covid 19 in the 2 weeks post procedure, please call and report this information to [place].    If any biopsies were taken you will be contacted by phone or by letter within the next 1-3 weeks.  Please call us at [phone] if you have not heard about the biopsies in 3 weeks.  SIGNATURES/CONFIDENTIALITY: You and/or your care partner have signed paperwork which will be entered into your electronic medical record.  These signatures attest to the fact that that the information above on your After Visit Summary has been reviewed and is understood.  Full responsibility of the confidentiality of this discharge information lies with you and/or your care-partner.

## 2021-05-19 NOTE — Op Note (Signed)
[facility] Patient Name: Jodi Norman Procedure Date: 05/19/2021 2:32 PM MRN: [id number] Endoscopist: Milus Banister , MD Age: 25 Referring MD:  Date of Birth: [birth year] Gender: Female Account #: [id number] Procedure:                Upper GI endoscopy Indications:              Iron deficiency anemia, Heartburn Procedure:                Pre-Anesthesia Assessment:                           - Prior to the procedure, a History and Physical                            was performed, and patient medications and                            allergies were reviewed. The patient's tolerance of                            previous anesthesia was also reviewed. The risks                            and benefits of the procedure and the sedation                            options and risks were discussed with the patient.                            All questions were answered, and informed consent                            was obtained. Prior Anticoagulants: The patient has                            taken no previous anticoagulant or antiplatelet                            agents. ASA Grade Assessment: III - A patient with                            severe systemic disease. After reviewing the risks                            and benefits, the patient was deemed in                            satisfactory condition to undergo the procedure.                           After obtaining informed consent, the endoscope was                            passed under direct vision. Throughout the  procedure, the patient's blood pressure, pulse, and                            oxygen saturations were monitored continuously. The                            GIF [id number] #[id number] was introduced through the                            mouth, and advanced to the second part of duodenum.                            The upper GI endoscopy was accomplished without                             difficulty. The patient tolerated the procedure                            well. Scope In: Scope Out: Findings:                 LA Grade B (one or more mucosal breaks greater than                            5 mm, not extending between the tops of two mucosal                            folds) esophagitis was found in the lower third of                            the esophagus.                           A small hiatal hernia was present.                           The exam was otherwise without abnormality. Complications:            No immediate complications. Estimated blood loss:                            None. Estimated Blood Loss:     Estimated blood loss: none. Impression:               - Acid, reflux related esophagitis.                           - Small hiatal hernia.                           - The examination was otherwise normal. Recommendation:           - Patient has a contact number available for                            emergencies. The signs and symptoms of potential  delayed complications were discussed with the                            patient. Return to normal activities tomorrow.                            Written discharge instructions were provided to the                            patient.                           - Resume previous diet.                           - Continue present medications. New prescription                            called in today: omeprazole 40mg  pills, take one                            pill shortly before breakfast and dinner meals,                            disp 3 months, 3 refills. Losing weight will                            probably help your GERD as well. Milus Banister, MD 05/19/2021 3:21:47 PM This report has been signed electronically.

## 2021-05-19 NOTE — Op Note (Signed)
[facility] Patient Name: Jodi Norman Procedure Date: 05/19/2021 2:32 PM MRN: [id number] Endoscopist: Milus Banister , MD Age: 25 Referring MD:  Date of Birth: 29-Dec-1995 Gender: Female Account #: [id number] Procedure:                Colonoscopy Indications:              Iron deficiency anemia, minor intermittent rectal                            bleeding Medicines:                Monitored Anesthesia Care Procedure:                Pre-Anesthesia Assessment:                           - Prior to the procedure, a History and Physical                            was performed, and patient medications and                            allergies were reviewed. The patient's tolerance of                            previous anesthesia was also reviewed. The risks                            and benefits of the procedure and the sedation                            options and risks were discussed with the patient.                            All questions were answered, and informed consent                            was obtained. Prior Anticoagulants: The patient has                            taken no previous anticoagulant or antiplatelet                            agents. ASA Grade Assessment: III - A patient with                            severe systemic disease. After reviewing the risks                            and benefits, the patient was deemed in                            satisfactory condition to undergo the procedure.  After obtaining informed consent, the colonoscope                            was passed under direct vision. Throughout the                            procedure, the patient's blood pressure, pulse, and                            oxygen saturations were monitored continuously. The                            Olympus CF-HQ190L [id number]) Colonoscope was                            introduced through the anus and advanced to the the                             cecum, identified by appendiceal orifice and                            ileocecal valve. The colonoscopy was performed                            without difficulty. The patient tolerated the                            procedure well. The quality of the bowel                            preparation was good. The ileocecal valve,                            appendiceal orifice, and rectum were photographed. Scope In: 3:00:37 PM Scope Out: 3:10:40 PM Scope Withdrawal Time: 0 hours 7 minutes 13 seconds  Total Procedure Duration: 0 hours 10 minutes 3 seconds  Findings:                 The entire examined colon appeared normal on direct                            and retroflexion views. Complications:            No immediate complications. Estimated blood loss:                            None. Estimated Blood Loss:     Estimated blood loss: none. Impression:               - The entire examined colon is normal on direct and                            retroflexion views.                           - No polyps or cancers.                           -  Minor rectal bleeding is likely from small                            intermittent hemorrhoids. Recommendation:           - EGD now for chronic GERD, IDA workup                           - Colonoscopy for colon cancer screening at age 68 Milus Banister, MD 05/19/2021 3:13:43 PM This report has been signed electronically.

## 2021-05-19 NOTE — Progress Notes (Signed)
Check-in-am  V/s-cw

## 2021-05-19 NOTE — Progress Notes (Signed)
1456 Robinul 0.1 mg IV given due large amount of secretions upon assessment.  MD made aware, vss  

## 2021-05-19 NOTE — Progress Notes (Signed)
HPI: This is a woman with IDA, minor rectal bleeding, getting bariatric surgery in near future.  Seen by Jaclyn Shaggy, staffed by Dr. Havery Moros.  Needed to have her colonoscopy and EGD in 2022 because of insurance reasons   ROS: complete GI ROS as described in HPI, all other review negative.  Constitutional:  No unintentional weight loss   Past Medical History:  Diagnosis Date   Generalized anxiety disorder 06/24/2020   GERD (gastroesophageal reflux disease)     Past Surgical History:  Procedure Laterality Date   CESAREAN SECTION      Current Outpatient Medications  Medication Sig Dispense Refill   amitriptyline (ELAVIL) 10 MG tablet Take 10 mg by mouth at bedtime as needed for sleep.     clonazePAM (KLONOPIN) 0.5 MG tablet Take 1 tablet (0.5 mg total) by mouth 2 (two) times daily as needed for anxiety. 60 tablet 0   doxycycline (VIBRAMYCIN) 100 MG capsule Take 100 mg by mouth daily.     escitalopram (LEXAPRO) 10 MG tablet Take 10 mg by mouth daily.     ibuprofen (ADVIL) 200 MG tablet Take 400-800 mg by mouth every 6 (six) hours as needed for moderate pain, headache or cramping.     Iron, Ferrous Sulfate, 325 (65 Fe) MG TABS Take 325 mg by mouth daily. 30 tablet 2   omeprazole (PRILOSEC) 20 MG capsule Take 1 capsule (20 mg total) by mouth 2 (two) times daily before a meal. 60 capsule 1   ondansetron (ZOFRAN ODT) 8 MG disintegrating tablet Take 1 tablet (8 mg total) by mouth every 8 (eight) hours as needed for nausea or vomiting. 10 tablet 0   SUPREP BOWEL PREP KIT 17.5-3.13-1.6 GM/177ML SOLN Take 1 kit by mouth as directed. For colonoscopy prep 354 mL 0   traMADol-acetaminophen (ULTRACET) 37.5-325 MG tablet Take 1 tablet by mouth every 6 (six) hours as needed. 30 tablet 0   Vitamin D, Ergocalciferol, (DRISDOL) 1.25 MG (50000 UNIT) CAPS capsule Take 1 capsule (50,000 Units total) by mouth every 7 (seven) days. 12 capsule 0   Current Facility-Administered Medications  Medication Dose  Route Frequency Provider Last Rate Last Admin   0.9 %  sodium chloride infusion  500 mL Intravenous Once Milus Banister, MD        Allergies as of 05/19/2021   (No Known Allergies)    Family History  Problem Relation Age of Onset   Hyperlipidemia Mother    Hypertension Mother    Hyperlipidemia Father    Hypertension Father    Diabetes Father    Colon cancer Neg Hx    Stomach cancer Neg Hx    Esophageal cancer Neg Hx    Pancreatic cancer Neg Hx     Social History   Socioeconomic History   Marital status: Single    Spouse name: Not on file   Number of children: Not on file   Years of education: Not on file   Highest education level: Not on file  Occupational History   Not on file  Tobacco Use   Smoking status: Never   Smokeless tobacco: Never  Vaping Use   Vaping Use: Never used  Substance and Sexual Activity   Alcohol use: Never   Drug use: Never   Sexual activity: Not Currently    Birth control/protection: None  Other Topics Concern   Not on file  Social History Narrative   Not on file   Social Determinants of Health   Financial Resource Strain: Not on  file  Food Insecurity: Not on file  Transportation Needs: Not on file  Physical Activity: Not on file  Stress: Not on file  Social Connections: Not on file  Intimate Partner Violence: Not on file     Physical Exam:  Constitutional: generally well-appearing Psychiatric: alert and oriented x3 Lungs: CTA bilaterally Heart: no MCR  Assessment and plan: 25 y.o. female with IDA, minor rectal bleeding, getting bariatric surgery in near future.  Seen by Jaclyn Shaggy, staffed by Dr. Havery Moros.  Needed to have her colonoscopy and EGD in 2022 because of insurance reasons  Colonoscoy and egd today  Care is appropriate for the ambulatory setting.  Owens Loffler, MD [facility] 05/19/2021, 2:39 PM

## 2021-05-23 ENCOUNTER — Telehealth: Payer: Self-pay | Admitting: Gastroenterology

## 2021-05-23 ENCOUNTER — Ambulatory Visit: Payer: Medicaid Other | Admitting: Physical Therapy

## 2021-05-23 NOTE — Telephone Encounter (Signed)
Pt called in asking for her results from her procedure on 12/23. Pt was informed that the results could take up to 2-3 weeks to hear back, and that we would give her a call once MD has reviewed it. Pt asked for a work note to excuse her for her absence on procedure day. Letter sent to pt via MyChart. No further questions.

## 2021-05-23 NOTE — Telephone Encounter (Signed)
Patient calling requesting results from procedure, please advise.

## 2021-05-24 ENCOUNTER — Telehealth: Payer: Self-pay | Admitting: *Deleted

## 2021-05-24 NOTE — Telephone Encounter (Signed)
°  Follow up Call-  Call back number 05/19/2021  Post procedure Call Back phone  # [phone]  Permission to leave phone message Yes  Some recent data might be hidden     Patient questions:  Do you have a fever, pain , or abdominal swelling? No. Pain Score  0 *  Have you tolerated food without any problems? Yes.    Have you been able to return to your normal activities? Yes.    Do you have any questions about your discharge instructions: Diet   No. Medications  No. Follow up visit  No.  Do you have questions or concerns about your Care? No.  Actions: * If pain score is 4 or above: No action needed, pain <4.

## 2021-05-25 ENCOUNTER — Ambulatory Visit (INDEPENDENT_AMBULATORY_CARE_PROVIDER_SITE_OTHER): Payer: Medicaid Other

## 2021-05-25 DIAGNOSIS — E538 Deficiency of other specified B group vitamins: Secondary | ICD-10-CM

## 2021-05-25 MED ORDER — CYANOCOBALAMIN 1000 MCG/ML IJ SOLN
1000.0000 ug | Freq: Once | INTRAMUSCULAR | Status: AC
Start: 2021-05-25 — End: 2021-05-25
  Administered 2021-05-25: 08:00:00 1000 ug via INTRAMUSCULAR

## 2021-05-25 NOTE — Progress Notes (Signed)
Pt here for 4/4 weekly B12 injection per Elise Benne   B12 1076mcg given IM, left deltoid and pt tolerated injection well.   Next B12 injection scheduled for 06/27/21

## 2021-05-28 HISTORY — PX: MOUTH SURGERY: SHX715

## 2021-05-31 ENCOUNTER — Telehealth: Payer: Self-pay | Admitting: Gastroenterology

## 2021-05-31 NOTE — Telephone Encounter (Signed)
The pt has been advised that no biopsy was taken during her procedures.  She wanted to know what the next steps are in getting her gastric sleeve surgery.  I advised her to call her PCP and make them aware that she had her endo colon and see where they are in the referral process for the surgery.

## 2021-05-31 NOTE — Telephone Encounter (Signed)
Patient called back and is still waiting to hear the results back from her EGD and Colon done in December.  She needs to know so she can schedule her gastric sleeve surgery.

## 2021-06-05 ENCOUNTER — Encounter: Payer: Self-pay | Admitting: Family Medicine

## 2021-06-05 ENCOUNTER — Telehealth (INDEPENDENT_AMBULATORY_CARE_PROVIDER_SITE_OTHER): Payer: Medicaid Other | Admitting: Family Medicine

## 2021-06-05 NOTE — Progress Notes (Signed)
MyChart Video Visit    Virtual Visit via Video Note   This visit type was conducted due to national recommendations for restrictions regarding the COVID-19 Pandemic (e.g. social distancing) in an effort to limit this patient's exposure and mitigate transmission in our community. This patient is at least at moderate risk for complications without adequate follow up. This format is felt to be most appropriate for this patient at this time. Physical exam was limited by quality of the video and audio technology used for the visit. Jodi Grew. was able to get the patient set up on a video visit.  Patient location: Home Patient and provider in visit Provider location: Office  I discussed the limitations of evaluation and management by telemedicine and the availability of in person appointments. The patient expressed understanding and agreed to proceed.  Visit Date: 06/05/2021  Today's healthcare provider: Ann Held, DO     Subjective:    Patient ID: Jodi Norman, female    DOB: [birth year], 26 y.o.   MRN: [id number]  Chief Complaint  Patient presents with   Referral    HPI Patient is in today for a video visit.  At her last visit, she was referred to a gastroenterologist for a colonoscopy and an endoscopy. She would like a referral to a bariatric surgeon for a weight-loss surgery.  She also reports that her left breast has been feeling tender and she would like to come in to have her breast examined.  Past Medical History:  Diagnosis Date   Generalized anxiety disorder 06/24/2020   GERD (gastroesophageal reflux disease)     Past Surgical History:  Procedure Laterality Date   CESAREAN SECTION      Family History  Problem Relation Age of Onset   Hyperlipidemia Mother    Hypertension Mother    Hyperlipidemia Father    Hypertension Father    Diabetes Father    Colon cancer Neg Hx    Stomach cancer Neg Hx    Esophageal cancer Neg Hx    Pancreatic cancer Neg  Hx    Colon polyps Neg Hx    Rectal cancer Neg Hx     Social History   Socioeconomic History   Marital status: Single    Spouse name: Not on file   Number of children: Not on file   Years of education: Not on file   Highest education level: Not on file  Occupational History   Not on file  Tobacco Use   Smoking status: Never   Smokeless tobacco: Never  Vaping Use   Vaping Use: Never used  Substance and Sexual Activity   Alcohol use: Never   Drug use: Never   Sexual activity: Not Currently    Birth control/protection: None  Other Topics Concern   Not on file  Social History Narrative   Not on file   Social Determinants of Health   Financial Resource Strain: Not on file  Food Insecurity: Not on file  Transportation Needs: Not on file  Physical Activity: Not on file  Stress: Not on file  Social Connections: Not on file  Intimate Partner Violence: Not on file    Outpatient Medications Prior to Visit  Medication Sig Dispense Refill   amitriptyline (ELAVIL) 10 MG tablet Take 10 mg by mouth at bedtime as needed for sleep.     clonazePAM (KLONOPIN) 0.5 MG tablet Take 1 tablet (0.5 mg total) by mouth 2 (two) times daily as needed for anxiety. 60 tablet  0   doxycycline (VIBRAMYCIN) 100 MG capsule Take 100 mg by mouth daily.     escitalopram (LEXAPRO) 10 MG tablet Take 10 mg by mouth daily.     ibuprofen (ADVIL) 200 MG tablet Take 400-800 mg by mouth every 6 (six) hours as needed for moderate pain, headache or cramping.     omeprazole (PRILOSEC) 40 MG capsule Take 1 capsule (40 mg total) by mouth 2 (two) times daily. 90 capsule 3   traMADol-acetaminophen (ULTRACET) 37.5-325 MG tablet Take 1 tablet by mouth every 6 (six) hours as needed. 30 tablet 0   Iron, Ferrous Sulfate, 325 (65 Fe) MG TABS Take 325 mg by mouth daily. (Patient not taking: Reported on 05/19/2021) 30 tablet 2   ondansetron (ZOFRAN ODT) 8 MG disintegrating tablet Take 1 tablet (8 mg total) by mouth every 8  (eight) hours as needed for nausea or vomiting. (Patient not taking: Reported on 05/19/2021) 10 tablet 0   Vitamin D, Ergocalciferol, (DRISDOL) 1.25 MG (50000 UNIT) CAPS capsule Take 1 capsule (50,000 Units total) by mouth every 7 (seven) days. (Patient not taking: Reported on 05/19/2021) 12 capsule 0   No facility-administered medications prior to visit.    No Known Allergies  Review of Systems  Constitutional:  Negative for fever.  HENT:  Negative for congestion, ear pain, hearing loss, sinus pain and sore throat.   Eyes:  Negative for blurred vision and pain.  Respiratory:  Negative for cough, sputum production, shortness of breath and wheezing.   Cardiovascular:  Negative for chest pain and palpitations.  Gastrointestinal:  Negative for blood in stool, constipation, diarrhea, nausea and vomiting.  Genitourinary:  Negative for dysuria, frequency, hematuria and urgency.  Musculoskeletal:  Negative for back pain, falls and myalgias.  Neurological:  Negative for dizziness, sensory change, loss of consciousness, weakness and headaches.  Endo/Heme/Allergies:  Negative for environmental allergies. Does not bruise/bleed easily.  Psychiatric/Behavioral:  Negative for depression and suicidal ideas. The patient is not nervous/anxious and does not have insomnia.       Objective:    Physical Exam Constitutional:      Appearance: Normal appearance.  Neurological:     Mental Status: She is alert and oriented to person, place, and time.  Psychiatric:        Mood and Affect: Mood normal.        Behavior: Behavior normal.        Judgment: Judgment normal.    There were no vitals taken for this visit. Wt Readings from Last 3 Encounters:  05/19/21 241 lb (109.3 kg)  04/27/21 230 lb (104.3 kg)  04/12/21 241 lb (109.3 kg)    Diabetic Foot Exam - Simple   No data filed    Lab Results  Component Value Date   WBC 5.7 04/27/2021   HGB 10.0 (L) 04/27/2021   HCT 30.8 (L) 04/27/2021   PLT  257 04/27/2021   GLUCOSE 90 03/06/2021   CHOL 140 07/28/2020   TRIG 67.0 07/28/2020   HDL 44.70 07/28/2020   LDLCALC 82 07/28/2020   ALT 9 03/06/2021   AST 14 (L) 03/06/2021   NA 138 03/06/2021   K 4.1 03/06/2021   CL 104 03/06/2021   CREATININE 0.73 03/06/2021   BUN 9 03/06/2021   CO2 27 03/06/2021   TSH 1.83 03/28/2021   INR 1.0 04/27/2021   HGBA1C 5.6 07/11/2020    Lab Results  Component Value Date   TSH 1.83 03/28/2021   Lab Results  Component Value Date  WBC 5.7 04/27/2021   HGB 10.0 (L) 04/27/2021   HCT 30.8 (L) 04/27/2021   MCV 82.8 04/27/2021   PLT 257 04/27/2021   Lab Results  Component Value Date   NA 138 03/06/2021   K 4.1 03/06/2021   CO2 27 03/06/2021   GLUCOSE 90 03/06/2021   BUN 9 03/06/2021   CREATININE 0.73 03/06/2021   BILITOT 0.3 03/06/2021   ALKPHOS 90 03/06/2021   AST 14 (L) 03/06/2021   ALT 9 03/06/2021   PROT 7.3 03/06/2021   ALBUMIN 4.3 03/06/2021   CALCIUM 9.6 03/06/2021   ANIONGAP 7 03/06/2021   GFR 124.74 07/28/2020   Lab Results  Component Value Date   CHOL 140 07/28/2020   Lab Results  Component Value Date   HDL 44.70 07/28/2020   Lab Results  Component Value Date   LDLCALC 82 07/28/2020   Lab Results  Component Value Date   TRIG 67.0 07/28/2020   Lab Results  Component Value Date   CHOLHDL 3 07/28/2020   Lab Results  Component Value Date   HGBA1C 5.6 07/11/2020       Assessment & Plan:   Problem List Items Addressed This Visit       Unprioritized   Morbid obesity ([place]) - Primary    Pt is requesting a referral for bariatric surgery       Relevant Orders   Ambulatory referral to General Surgery    No orders of the defined types were placed in this encounter.   I discussed the assessment and treatment plan with the patient. The patient was provided an opportunity to ask questions and all were answered. The patient agreed with the plan and demonstrated an understanding of the instructions.   The  patient was advised to call back or seek an in-person evaluation if the symptoms worsen or if the condition fails to improve as anticipated.  I provided 20 minutes of face-to-face time during this encounter.   I,[name],acting as a Education administrator for [employer], DO.,have documented all relevant documentation on the behalf of Ann Held, DO,as directed by  Ann Held, DO while in the presence of [place], DO, have reviewed all documentation for this visit. The documentation on 06/05/21 for the exam, diagnosis, procedures, and orders are all accurate and complete.    Ann Held, DO [place] [facility] [employer] [facility] [phone] (phone) [phone] (fax)  [place]

## 2021-06-05 NOTE — Assessment & Plan Note (Signed)
Pt is requesting a referral for bariatric surgery

## 2021-06-06 ENCOUNTER — Telehealth: Payer: Self-pay | Admitting: Cardiology

## 2021-06-06 NOTE — Telephone Encounter (Signed)
Called patient to let her know after consulting with Dr. Harriet Masson we will not reorder the sleep study due to her cancelling the first time. She can have her PCP order that same test. She states "Well the first time it was cancelled I was dealing with cancer and I was not up to doing it. Me and the nurse had that understanding. I was seen again and I still have not had my sleep study scheduled. I am calling in regards to that." Pt made aware I will reach out to the sleep study staff to inquire about scheduling her sleep study. Pt made aware she will receive a phone call in regards to this and she should have her voicemail set up to receive a message if needed. Notes on split night sleep study states "authorization is pending 05/10/21". Home sleep study placed 05/12/2021

## 2021-06-06 NOTE — Telephone Encounter (Signed)
Patient is calling back because she states she ready to set up her sleep study. Please advise

## 2021-06-12 ENCOUNTER — Ambulatory Visit (INDEPENDENT_AMBULATORY_CARE_PROVIDER_SITE_OTHER): Payer: Medicaid Other | Admitting: Family Medicine

## 2021-06-12 ENCOUNTER — Encounter: Payer: Self-pay | Admitting: Family Medicine

## 2021-06-12 VITALS — BP 108/68 | HR 104 | Temp 98.7°F | Resp 18 | Ht 67.0 in | Wt 251.4 lb

## 2021-06-12 DIAGNOSIS — N644 Mastodynia: Secondary | ICD-10-CM | POA: Diagnosis not present

## 2021-06-12 NOTE — Progress Notes (Signed)
Established Patient Office Visit  Subjective:  Patient ID: Jodi Norman, female    DOB: [birth year]  Age: 26 y.o. MRN: [id number]  CC:  Chief Complaint  Patient presents with   Breast Pain    X3 weeks, left breast, Pt states no redness, swelling, or discharge from the nipple.     HPI Jodi Norman presents for breast pain L breast.  No masses, no redness no nipple d/c.    Past Medical History:  Diagnosis Date   Generalized anxiety disorder 06/24/2020   GERD (gastroesophageal reflux disease)     Past Surgical History:  Procedure Laterality Date   CESAREAN SECTION      Family History  Problem Relation Age of Onset   Hyperlipidemia Mother    Hypertension Mother    Hyperlipidemia Father    Hypertension Father    Diabetes Father    Colon cancer Neg Hx    Stomach cancer Neg Hx    Esophageal cancer Neg Hx    Pancreatic cancer Neg Hx    Colon polyps Neg Hx    Rectal cancer Neg Hx     Social History   Socioeconomic History   Marital status: Single    Spouse name: Not on file   Number of children: Not on file   Years of education: Not on file   Highest education level: Not on file  Occupational History   Not on file  Tobacco Use   Smoking status: Never   Smokeless tobacco: Never  Vaping Use   Vaping Use: Never used  Substance and Sexual Activity   Alcohol use: Never   Drug use: Never   Sexual activity: Not Currently    Birth control/protection: None  Other Topics Concern   Not on file  Social History Narrative   Not on file   Social Determinants of Health   Financial Resource Strain: Not on file  Food Insecurity: Not on file  Transportation Needs: Not on file  Physical Activity: Not on file  Stress: Not on file  Social Connections: Not on file  Intimate Partner Violence: Not on file    Outpatient Medications Prior to Visit  Medication Sig Dispense Refill   amitriptyline (ELAVIL) 10 MG tablet Take 10 mg by mouth at bedtime as needed for sleep.      clonazePAM (KLONOPIN) 0.5 MG tablet Take 1 tablet (0.5 mg total) by mouth 2 (two) times daily as needed for anxiety. 60 tablet 0   doxycycline (VIBRAMYCIN) 100 MG capsule Take 100 mg by mouth daily.     escitalopram (LEXAPRO) 10 MG tablet Take 10 mg by mouth daily.     ibuprofen (ADVIL) 200 MG tablet Take 400-800 mg by mouth every 6 (six) hours as needed for moderate pain, headache or cramping.     omeprazole (PRILOSEC) 40 MG capsule Take 1 capsule (40 mg total) by mouth 2 (two) times daily. 90 capsule 3   traMADol-acetaminophen (ULTRACET) 37.5-325 MG tablet Take 1 tablet by mouth every 6 (six) hours as needed. 30 tablet 0   No facility-administered medications prior to visit.    No Known Allergies  ROS Review of Systems  Constitutional:  Negative for fever.  HENT:  Negative for congestion.   Respiratory:  Negative for shortness of breath.   Cardiovascular:  Negative for chest pain, palpitations and leg swelling.  Gastrointestinal:  Negative for abdominal pain, blood in stool and nausea.  Genitourinary:  Negative for dysuria and frequency.  Skin:  Negative for rash.  Allergic/Immunologic: Negative for environmental allergies.  Neurological:  Negative for dizziness and headaches.  Psychiatric/Behavioral:  The patient is not nervous/anxious.      Objective:    Physical Exam Vitals and nursing note reviewed.  Constitutional:      Appearance: She is well-developed.  HENT:     Head: Normocephalic and atraumatic.  Eyes:     Conjunctiva/sclera: Conjunctivae normal.  Neck:     Thyroid: No thyromegaly.     Vascular: No carotid bruit or JVD.  Cardiovascular:     Rate and Rhythm: Normal rate and regular rhythm.     Heart sounds: Normal heart sounds. No murmur heard. Pulmonary:     Effort: Pulmonary effort is normal. No respiratory distress.     Breath sounds: Normal breath sounds. No wheezing or rales.  Chest:     Chest wall: No tenderness.  Breasts:    Right: No swelling,  bleeding, inverted nipple, mass, nipple discharge, skin change or tenderness.     Left: Tenderness present. No swelling, bleeding, inverted nipple, mass, nipple discharge or skin change.     Comments: Tenderness L lat breast  No errythema No d/c  No masses palpated  Musculoskeletal:     Cervical back: Normal range of motion and neck supple.  Neurological:     Mental Status: She is alert and oriented to person, place, and time.    BP 108/68 (BP Location: Left Arm, Patient Position: Sitting, Cuff Size: Large)    Pulse (!) 104    Temp 98.7 F (37.1 C) (Oral)    Resp 18    Ht 5\' 7"  (1.702 m)    Wt 251 lb 6.4 oz (114 kg)    SpO2 98%    BMI 39.37 kg/m  Wt Readings from Last 3 Encounters:  06/12/21 251 lb 6.4 oz (114 kg)  05/19/21 241 lb (109.3 kg)  04/27/21 230 lb (104.3 kg)     Health Maintenance Due  Topic Date Due   COVID-19 Vaccine (1) Never done   Pneumococcal Vaccine 67-33 Years old (1 - PCV) Never done   HPV VACCINES (1 - 2-dose series) Never done   HIV Screening  Never done   PAP-Cervical Cytology Screening  Never done   PAP SMEAR-Modifier  Never done   INFLUENZA VACCINE  12/26/2020       Topic Date Due   HPV VACCINES (1 - 2-dose series) Never done    Lab Results  Component Value Date   TSH 1.83 03/28/2021   Lab Results  Component Value Date   WBC 5.7 04/27/2021   HGB 10.0 (L) 04/27/2021   HCT 30.8 (L) 04/27/2021   MCV 82.8 04/27/2021   PLT 257 04/27/2021   Lab Results  Component Value Date   NA 138 03/06/2021   K 4.1 03/06/2021   CO2 27 03/06/2021   GLUCOSE 90 03/06/2021   BUN 9 03/06/2021   CREATININE 0.73 03/06/2021   BILITOT 0.3 03/06/2021   ALKPHOS 90 03/06/2021   AST 14 (L) 03/06/2021   ALT 9 03/06/2021   PROT 7.3 03/06/2021   ALBUMIN 4.3 03/06/2021   CALCIUM 9.6 03/06/2021   ANIONGAP 7 03/06/2021   GFR 124.74 07/28/2020   Lab Results  Component Value Date   CHOL 140 07/28/2020   Lab Results  Component Value Date   HDL 44.70 07/28/2020    Lab Results  Component Value Date   LDLCALC 82 07/28/2020   Lab Results  Component Value Date   TRIG 67.0 07/28/2020  Lab Results  Component Value Date   CHOLHDL 3 07/28/2020   Lab Results  Component Value Date   HGBA1C 5.6 07/11/2020      Assessment & Plan:   Problem List Items Addressed This Visit   None Visit Diagnoses     Breast pain, left    -  Primary   Relevant Orders   MM Digital Diagnostic Bilat   US BREAST LTD UNI LEFT INC AXILLA       No orders of the defined types were placed in this encounter.   Follow-up: Return if symptoms worsen or fail to improve.    Ann Held, DO

## 2021-06-12 NOTE — Patient Instructions (Signed)
Breast Tenderness Breast tenderness is a common problem for women of all ages, but may also occur in men. Breast tenderness may range from mild discomfort to severe pain. In women, the pain usually comes and goes with the menstrual cycle, but it can also be constant. Breast tenderness has many possible causes, including hormone changes, infections, and taking certain medicines. You may have tests, such as a mammogram or an ultrasound, to check for any unusual findings. Having breast tenderness usually does not mean that you have breast cancer. Follow these instructions at home: Managing pain and discomfort  If directed, put ice to the painful area. To do this: Put ice in a plastic bag. Place a towel between your skin and the bag. Leave the ice on for 20 minutes, 2-3 times a day. Wear a supportive bra, especially during exercise. You may also want to wear a supportive bra while sleeping if your breasts are very tender. Medicines Take over-the-counter and prescription medicines only as told by your health care provider. If the cause of your pain is infection, you may be prescribed an antibiotic medicine. If you were prescribed an antibiotic, take it as told by your health care provider. Do not stop taking the antibiotic even if you start to feel better. Eating and drinking Your health care provider may recommend that you lessen the amount of fat in your diet. You can do this by: Limiting fried foods. Cooking foods using methods such as baking, boiling, grilling, and broiling. Decrease the amount of caffeine in your diet. Instead, drink more water and choose caffeine-free drinks. General instructions  Keep a log of the days and times when your breasts are most tender. Ask your health care provider how to do breast exams at home. This will help you notice if you have an unusual growth or lump. Keep all follow-up visits as told by your health care provider. This is important. Contact a health care  provider if: Any part of your breast is hard, red, and hot to the touch. This may be a sign of infection. You are a woman and: Not breastfeeding and you have fluid, especially blood or pus, coming out of your nipples. Have a new or painful lump in your breast that remains after your menstrual period ends. You have a fever. Your pain does not improve or it gets worse. Your pain is interfering with your daily activities. Summary Breast tenderness may range from mild discomfort to severe pain. Breast tenderness has many possible causes, including hormone changes, infections, and taking certain medicines. It can be treated with ice, wearing a supportive bra, and medicines. Make changes to your diet if told to by your health care provider. This information is not intended to replace advice given to you by your health care provider. Make sure you discuss any questions you have with your health care provider. Document Revised: 10/06/2018 Document Reviewed: 10/06/2018 Elsevier Patient Education  2022 [employer]  

## 2021-06-13 ENCOUNTER — Ambulatory Visit: Payer: Medicaid Other | Admitting: Family Medicine

## 2021-06-13 ENCOUNTER — Encounter: Payer: Self-pay | Admitting: Cardiology

## 2021-06-14 ENCOUNTER — Telehealth: Payer: Self-pay | Admitting: Family Medicine

## 2021-06-14 NOTE — Telephone Encounter (Signed)
Patient states the place she was referred for breast pain does not have availability until February and she cannot wait that long. She was advised to send referral to [facility] mammography. Please advice.

## 2021-06-14 NOTE — Telephone Encounter (Signed)
Noted  

## 2021-06-19 ENCOUNTER — Other Ambulatory Visit: Payer: Self-pay | Admitting: Medical

## 2021-06-20 ENCOUNTER — Other Ambulatory Visit: Payer: Self-pay

## 2021-06-20 ENCOUNTER — Ambulatory Visit
Admission: RE | Admit: 2021-06-20 | Discharge: 2021-06-20 | Disposition: A | Discharge status: Home / Self Care | Payer: Medicaid Other | Source: Ambulatory Visit | Attending: Family Medicine | Admitting: Family Medicine

## 2021-06-20 DIAGNOSIS — N644 Mastodynia: Secondary | ICD-10-CM

## 2021-06-25 ENCOUNTER — Other Ambulatory Visit: Payer: Self-pay | Admitting: Medical

## 2021-06-27 ENCOUNTER — Ambulatory Visit: Payer: Medicaid Other

## 2021-06-29 ENCOUNTER — Encounter: Payer: Medicaid Other | Admitting: Gastroenterology

## 2021-07-04 ENCOUNTER — Other Ambulatory Visit: Payer: Self-pay

## 2021-07-04 ENCOUNTER — Emergency Department (HOSPITAL_BASED_OUTPATIENT_CLINIC_OR_DEPARTMENT_OTHER)
Admission: EM | Admit: 2021-07-04 | Discharge: 2021-07-04 | Disposition: A | Discharge status: Home / Self Care | Payer: No Typology Code available for payment source | Attending: Emergency Medicine | Admitting: Emergency Medicine

## 2021-07-04 ENCOUNTER — Encounter (HOSPITAL_BASED_OUTPATIENT_CLINIC_OR_DEPARTMENT_OTHER): Payer: Self-pay | Admitting: Urology

## 2021-07-04 DIAGNOSIS — E119 Type 2 diabetes mellitus without complications: Secondary | ICD-10-CM | POA: Diagnosis not present

## 2021-07-04 DIAGNOSIS — R42 Dizziness and giddiness: Secondary | ICD-10-CM | POA: Diagnosis present

## 2021-07-04 LAB — CBC WITH DIFFERENTIAL/PLATELET
Abs Immature Granulocytes: 0.02 10*3/uL (ref 0.00–0.07)
Basophils Absolute: 0 10*3/uL (ref 0.0–0.1)
Basophils Relative: 0 %
Eosinophils Absolute: 0.1 10*3/uL (ref 0.0–0.5)
Eosinophils Relative: 2 %
HCT: 31.9 % — ABNORMAL LOW (ref 36.0–46.0)
Hemoglobin: 10.4 g/dL — ABNORMAL LOW (ref 12.0–15.0)
Immature Granulocytes: 0 %
Lymphocytes Relative: 45 %
Lymphs Abs: 3.1 10*3/uL (ref 0.7–4.0)
MCH: 26.5 pg (ref 26.0–34.0)
MCHC: 32.6 g/dL (ref 30.0–36.0)
MCV: 81.4 fL (ref 80.0–100.0)
Monocytes Absolute: 0.6 10*3/uL (ref 0.1–1.0)
Monocytes Relative: 8 %
Neutro Abs: 3.1 10*3/uL (ref 1.7–7.7)
Neutrophils Relative %: 45 %
Platelets: 345 10*3/uL (ref 150–400)
RBC: 3.92 MIL/uL (ref 3.87–5.11)
RDW: 14.7 % (ref 11.5–15.5)
WBC: 6.9 10*3/uL (ref 4.0–10.5)
nRBC: 0 % (ref 0.0–0.2)

## 2021-07-04 LAB — URINALYSIS, ROUTINE W REFLEX MICROSCOPIC
Bilirubin Urine: NEGATIVE
Glucose, UA: NEGATIVE mg/dL
Hgb urine dipstick: NEGATIVE
Ketones, ur: NEGATIVE mg/dL
Leukocytes,Ua: NEGATIVE
Nitrite: NEGATIVE
Protein, ur: NEGATIVE mg/dL
Specific Gravity, Urine: 1.015 (ref 1.005–1.030)
pH: 6.5 (ref 5.0–8.0)

## 2021-07-04 LAB — BASIC METABOLIC PANEL
Anion gap: 9 (ref 5–15)
BUN: 9 mg/dL (ref 6–20)
CO2: 23 mmol/L (ref 22–32)
Calcium: 9.2 mg/dL (ref 8.9–10.3)
Chloride: 101 mmol/L (ref 98–111)
Creatinine, Ser: 0.62 mg/dL (ref 0.44–1.00)
GFR, Estimated: >60 mL/min (ref >60)
Glucose, Bld: 99 mg/dL (ref 70–99)
Potassium: 3.6 mmol/L (ref 3.5–5.1)
Sodium: 133 mmol/L — ABNORMAL LOW (ref 135–145)

## 2021-07-04 NOTE — ED Triage Notes (Signed)
Pt states 3 dizzy spells today States dry mouth x 1 month  Denies any new pain  States nausea earlier but not now  NAD at this time

## 2021-07-04 NOTE — Discharge Instructions (Signed)
Is unclear what caused your isolated episodes of lightheadedness, dizziness earlier.  As you have had no additional episodes for the entirety of your work-up, and your lab work is reassuring I recommend following up with your primary care, continuing to monitor self for worsening symptoms or return of the symptoms.  As we discussed you are mildly anemic, you may want to increase your dietary iron intake.  It was a pleasure taking care of you today, do not hesitate to return if you have any questions or concerns.

## 2021-07-04 NOTE — ED Notes (Addendum)
Pt states she became dizzy at work today x 3 , pt sits at a desk all day, complains of some nausea

## 2021-07-04 NOTE — ED Notes (Signed)
Pt. Reports she has had 3 dizzy spells today and felt she needed to be seen and her family Dr. Clayton Lefort booked up.

## 2021-07-04 NOTE — ED Provider Notes (Signed)
[facility] Provider Note   CSN: [id number] Arrival date & time: 07/04/21  1541     History  Chief Complaint  Patient presents with   Dizziness    Jodi Norman is a 26 y.o. female with past medical history significant for anxiety, history of migraines, insomnia who presents with complaints of 3 dizzy spells while at work today.  Patient reports she was sitting at her desk when she had sudden sensation of room spinning lasted for about 2 minutes.  Patient reports that she did have some unsteadiness on her feet when she tried to stand up.  Patient reports that she felt normal for about 30 minutes, before having another one of the spells.  Second spell lasted for about as long, before repeating a third time about an hour later.  Patient has had no spells since the arrival.  Patient does have some concern that she may be having early diabetes versus obstructive sleep apnea, she reports that she is being evaluated for obstructive sleep apnea with a sleep study in the coming month.  Patient reports family history of diabetes, and father reports that his symptoms were similar to what she experienced today when his condition started.  Patient reports that she has had consistent dry mouth, frequent urination, increased thirst despite drinking much more water.  Patient also reports that she has "horrible eating habits".  Patient denies any chest pain, shortness of breath, fever, chills, feeling of heart racing, recent abdominal pain, diarrhea.  Patient reports that she just finished her menstrual cycle, has had a history of anemia in the past.  Patient also reports that she just had wisdom tooth removal, has been on penicillin for the last 2 weeks.   Dizziness     Home Medications Prior to Admission medications   Medication Sig Start Date End Date Taking? Authorizing Provider  amitriptyline (ELAVIL) 10 MG tablet Take 10 mg by mouth at bedtime as needed for sleep. 12/28/20    [provider]  clonazePAM (KLONOPIN) 0.5 MG tablet Take 1 tablet (0.5 mg total) by mouth 2 (two) times daily as needed for anxiety. 11/17/20   Ann Held, DO  doxycycline (VIBRAMYCIN) 100 MG capsule Take 100 mg by mouth daily. 04/04/21   [provider]  escitalopram (LEXAPRO) 10 MG tablet Take 10 mg by mouth daily.    [provider]  ibuprofen (ADVIL) 200 MG tablet Take 400-800 mg by mouth every 6 (six) hours as needed for moderate pain, headache or cramping.    [provider]  omeprazole (PRILOSEC) 40 MG capsule Take 1 capsule (40 mg total) by mouth 2 (two) times daily. 05/19/21   Milus Banister, MD  traMADol-acetaminophen (ULTRACET) 37.5-325 MG tablet Take 1 tablet by mouth every 6 (six) hours as needed. 03/29/21   Jessy Oto, MD      Allergies    Patient has no known allergies.    Review of Systems   Review of Systems  Endocrine: Positive for polydipsia.  Neurological:  Positive for dizziness.  All other systems reviewed and are negative.  Physical Exam Updated Vital Signs BP 122/62    Pulse 92    Temp 98.7 F (37.1 C) (Oral)    Resp (!) 22    Ht 5\' 7"  (1.702 m)    Wt 113.4 kg    LMP 06/21/2021 (Approximate)    SpO2 100%    BMI 39.16 kg/m  Physical Exam Vitals and nursing note  reviewed.  Constitutional:      General: She is not in acute distress.    Appearance: Normal appearance.  HENT:     Head: Normocephalic and atraumatic.  Eyes:     General:        Right eye: No discharge.        Left eye: No discharge.  Cardiovascular:     Rate and Rhythm: Normal rate and regular rhythm.     Heart sounds: No murmur heard.   No friction rub. No gallop.  Pulmonary:     Effort: Pulmonary effort is normal.     Breath sounds: Normal breath sounds.  Abdominal:     General: Bowel sounds are normal.     Palpations: Abdomen is soft.  Skin:    General: Skin is warm and dry.     Capillary Refill: Capillary refill takes less than 2  seconds.  Neurological:     Mental Status: She is alert and oriented to person, place, and time.     Comments: Cranial nerves II through XII grossly intact.  Intact finger-nose, intact heel-to-shin.  Romberg negative, gait normal.  Alert and oriented x3.  Moves all 4 limbs spontaneously, normal coordination.  No pronator drift.  Intact strength 5 out of 5 bilateral upper and lower extremities.    Psychiatric:        Mood and Affect: Mood normal.        Behavior: Behavior normal.    ED Results / Procedures / Treatments   Labs (all labs ordered are listed, but only abnormal results are displayed) Labs Reviewed  CBC WITH DIFFERENTIAL/PLATELET - Abnormal; Notable for the following components:      Result Value   Hemoglobin 10.4 (*)    HCT 31.9 (*)    All other components within normal limits  BASIC METABOLIC PANEL - Abnormal; Notable for the following components:   Sodium 133 (*)    All other components within normal limits  URINALYSIS, ROUTINE W REFLEX MICROSCOPIC    EKG EKG Interpretation  Date/Time:  Tuesday July 04 2021 15:55:08 EST Ventricular Rate:  84 PR Interval:  128 QRS Duration: 86 QT Interval:  356 QTC Calculation: 420 R Axis:   42 Text Interpretation: Normal sinus rhythm Normal ECG When compared with ECG of 28-Feb-2021 19:15, No significant change since last tracing Confirmed by Aletta Edouard [phone]) on 07/04/2021 4:18:39 PM  Radiology No results found.  Procedures Procedures    Medications Ordered in ED Medications - No data to display  ED Course/ Medical Decision Making/ A&P                           Medical Decision Making Amount and/or Complexity of Data Reviewed Labs: ordered.   The patient with a history of anxiety, obesity, insomnia who presents with concern of dry mouth, increased thirst, with 3 isolated episodes of dizzy spells today.  Emergent differential diagnosis includes stroke, BPPV, labyrinthitis, Mnire's disease, with patient's  other symptoms also considered anemia, diabetes, or other metabolic abnormality.  This is not an exhaustive differential.  This patient was maintained on cardiac monitor for the duration of her stay.  Her initial EKG showed an underlying rhythm of normal sinus rhythm, these findings were independently interpreted by my attending Dr. Melina Copa who agrees with this interpretation.  I ordered and independently reviewed lab work which significant for BMP with very mild hyponatremia, sodium 133.  Unremarkable urinalysis.  CBC with mild anemia,  hemoglobin 10.4, this is similar to patient's baseline.  Discussed unclear etiology of her current symptoms, she did not have any positional symptoms of dizziness during my evaluation suggest BPPV, or other peripheral ENT lesion.  She has no focal neurologic deficit.  No evidence of cardiac arrhythmia, history of cardiac arrhythmia.  No family history of sudden cardiac death.  No evidence of delta waves, or other dysrhythmias on her EKG or cardiac monitoring today.  She has had no episodes of dizziness since her arrival.  Could be related to fatigue, dehydration, anxiety versus other.  Encouraged follow-up with PCP, return if symptoms worsen or return. Patient discharged in stable condition at this time, return precautions given. Final Clinical Impression(s) / ED Diagnoses Final diagnoses:  Dizziness    Rx / DC Orders ED Discharge Orders     None         Dorien Chihuahua 07/04/21 1909    Hayden Rasmussen, MD 07/05/21 [phone]

## 2021-07-12 ENCOUNTER — Other Ambulatory Visit: Payer: Medicaid Other

## 2021-07-14 ENCOUNTER — Encounter (HOSPITAL_BASED_OUTPATIENT_CLINIC_OR_DEPARTMENT_OTHER): Payer: Medicaid Other | Admitting: Cardiovascular Disease

## 2021-07-17 ENCOUNTER — Encounter: Payer: Self-pay | Admitting: Family Medicine

## 2021-07-17 ENCOUNTER — Ambulatory Visit (INDEPENDENT_AMBULATORY_CARE_PROVIDER_SITE_OTHER): Payer: No Typology Code available for payment source | Admitting: Family Medicine

## 2021-07-17 ENCOUNTER — Ambulatory Visit (HOSPITAL_BASED_OUTPATIENT_CLINIC_OR_DEPARTMENT_OTHER): Payer: Medicaid Other | Admitting: Cardiovascular Disease

## 2021-07-17 VITALS — BP 110/60 | HR 114 | Temp 98.7°F | Resp 18 | Ht 67.0 in | Wt 250.4 lb

## 2021-07-17 DIAGNOSIS — E538 Deficiency of other specified B group vitamins: Secondary | ICD-10-CM | POA: Diagnosis not present

## 2021-07-17 DIAGNOSIS — R42 Dizziness and giddiness: Secondary | ICD-10-CM

## 2021-07-17 DIAGNOSIS — G43019 Migraine without aura, intractable, without status migrainosus: Secondary | ICD-10-CM

## 2021-07-17 DIAGNOSIS — F411 Generalized anxiety disorder: Secondary | ICD-10-CM

## 2021-07-17 DIAGNOSIS — E669 Obesity, unspecified: Secondary | ICD-10-CM | POA: Diagnosis not present

## 2021-07-17 DIAGNOSIS — K219 Gastro-esophageal reflux disease without esophagitis: Secondary | ICD-10-CM

## 2021-07-17 MED ORDER — CYANOCOBALAMIN 1000 MCG/ML IJ SOLN
1000.0000 ug | INTRAMUSCULAR | Status: DC
  Administered 2021-07-17: 1000 ug via INTRAMUSCULAR

## 2021-07-17 NOTE — Assessment & Plan Note (Signed)
-  cont omeprazole

## 2021-07-17 NOTE — Assessment & Plan Note (Signed)
Stable

## 2021-07-17 NOTE — Patient Instructions (Signed)
Obesity, Adult Obesity is the condition of having too much total body fat. Being overweight or obese means that your weight is greater than what is considered healthy for your body size. Obesity is determined by a measurement called BMI (body mass index). BMI is an estimate of body fat and is calculated from height and weight. For adults, a BMI of 30 or higher is considered obese. Obesity can lead to other health concerns and major illnesses, including: Stroke. Coronary artery disease (CAD). Type 2 diabetes. Some types of cancer, including cancers of the colon, breast, uterus, and gallbladder. High blood pressure (hypertension). High cholesterol. Gallbladder stones. Obesity can also contribute to: Osteoarthritis. Sleep apnea. Infertility problems. What are the causes? Common causes of this condition include: Eating daily meals that are high in calories, sugar, and fat. Drinking high amounts of sugar-sweetened beverages, such as soft drinks. Being born with genes that may make you more likely to become obese. Having a medical condition that causes obesity, including: Hypothyroidism. Polycystic ovarian syndrome (PCOS). Binge-eating disorder. Cushing syndrome. Taking certain medicines, such as steroids, antidepressants, and seizure medicines. Not being physically active (sedentary lifestyle). Not getting enough sleep. What increases the risk? The following factors may make you more likely to develop this condition: Having a family history of obesity. Living in an area with limited access to: [place], recreation centers, or sidewalks. Healthy food choices, such as grocery stores and farmers' markets. What are the signs or symptoms? The main sign of this condition is having too much body fat. How is this diagnosed? This condition is diagnosed based on: Your BMI. If you are an adult with a BMI of 30 or higher, you are considered obese. Your waist circumference. This measures the  distance around your waistline. Your skinfold thickness. Your health care provider may gently pinch a fold of your skin and measure it. You may have other tests to check for underlying conditions. How is this treated? Treatment for this condition often includes changing your lifestyle. Treatment may include some or all of the following: Dietary changes. This may include developing a healthy meal plan. Regular physical activity. This may include activity that causes your heart to beat faster (aerobic exercise) and strength training. Work with your health care provider to design an exercise program that works for you. Medicine to help you lose weight if you are unable to lose one pound a week after six weeks of healthy eating and more physical activity. Treating conditions that cause the obesity (underlying conditions). Surgery. Surgical options may include gastric banding and gastric bypass. Surgery may be done if: Other treatments have not helped to improve your condition. You have a BMI of 40 or higher. You have life-threatening health problems related to obesity. Follow these instructions at home: Eating and drinking  Follow recommendations from your health care provider about what you eat and drink. Your health care provider may advise you to: Limit fast food, sweets, and processed snack foods. Choose low-fat options, such as low-fat milk instead of whole milk. Eat five or more servings of fruits or vegetables every day. Choose healthy foods when you eat out. Keep low-fat snacks available. Limit sugary drinks, such as soda, fruit juice, sweetened iced tea, and flavored milk. Drink enough water to keep your urine pale yellow. Do not follow a fad diet. Fad diets can be unhealthy and even dangerous. Other healthful choices include: Eat at home more often. This gives you more control over what you eat. Learn to read food labels.  This will help you understand how much food is considered one  serving. Learn what a healthy serving size is. Physical activity Exercise regularly, as told by your health care provider. Most adults should get up to 150 minutes of moderate-intensity exercise every week. Ask your health care provider what types of exercise are safe for you and how often you should exercise. Warm up and stretch before being active. Cool down and stretch after being active. Rest between periods of activity. Lifestyle Work with your health care provider and a dietitian to set a weight-loss goal that is healthy and reasonable for you. Limit your screen time. Find ways to reward yourself that do not involve food. Do not drink alcohol if: Your health care provider tells you not to drink. You are pregnant, may be pregnant, or are planning to become pregnant. If you drink alcohol: Limit how much you have to: 0-1 drink a day for women. 0-2 drinks a day for men. Know how much alcohol is in your drink. In the [place] one drink equals one 12 oz bottle of beer (355 mL), one 5 oz glass of wine (148 mL), or one 1 oz glass of hard liquor (44 mL). General instructions Keep a weight-loss journal to keep track of the food you eat and how much exercise you get. Take over-the-counter and prescription medicines only as told by your health care provider. Take vitamins and supplements only as told by your health care provider. Consider joining a support group. Your health care provider may be able to recommend a support group. Pay attention to your mental health as obesity can lead to depression or self esteem issues. Keep all follow-up visits. This is important. Contact a health care provider if: You are unable to meet your weight-loss goal after six weeks of dietary and lifestyle changes. You have trouble breathing. Summary Obesity is the condition of having too much total body fat. Being overweight or obese means that your weight is greater than what is considered healthy for your body  size. Work with your health care provider and a dietitian to set a weight-loss goal that is healthy and reasonable for you. Exercise regularly, as told by your health care provider. Ask your health care provider what types of exercise are safe for you and how often you should exercise. This information is not intended to replace advice given to you by your health care provider. Make sure you discuss any questions you have with your health care provider. Document Revised: 12/20/2020 Document Reviewed: 12/20/2020 Elsevier Patient Education  [place].

## 2021-07-17 NOTE — Assessment & Plan Note (Signed)
Stable On elavil

## 2021-07-17 NOTE — Progress Notes (Signed)
Subjective:   By signing my name below, I, Jodi Norman, attest that this documentation has been prepared under the direction and in the presence of Jodi Held, DO. 07/17/2021    Patient ID: Jodi Norman, female    DOB: 11-Mar-1996, 26 y.o.   MRN: [id number]  Chief Complaint  Patient presents with   Weight Loss    HPI Patient is in today for an office visit  She is requiring paperwork to be filled for bariatric surgery.  She was in the ED on 02/07 complaining of dizziness at work after 3 episodes. Lab work showed a history of anemia. She is not consistently taking her iron pills.   She is not consistently taking 40 mg omeprazole to manage her acid reflux and notes she has cut out foods that could trigger the reflux    She has tried [employer]. Peyton Bottoms, behaviour modification and premier liquid shakes .   Past Medical History:  Diagnosis Date   Generalized anxiety disorder 06/24/2020   GERD (gastroesophageal reflux disease)     Past Surgical History:  Procedure Laterality Date   CESAREAN SECTION      Family History  Problem Relation Age of Onset   Hyperlipidemia Mother    Hypertension Mother    Hyperlipidemia Father    Hypertension Father    Diabetes Father    Colon cancer Neg Hx    Stomach cancer Neg Hx    Esophageal cancer Neg Hx    Pancreatic cancer Neg Hx    Colon polyps Neg Hx    Rectal cancer Neg Hx     Social History   Socioeconomic History   Marital status: Single    Spouse name: Not on file   Number of children: Not on file   Years of education: Not on file   Highest education level: Not on file  Occupational History   Not on file  Tobacco Use   Smoking status: Never   Smokeless tobacco: Never  Vaping Use   Vaping Use: Never used  Substance and Sexual Activity   Alcohol use: Never   Drug use: Never   Sexual activity: Not Currently    Birth control/protection: None  Other Topics Concern   Not on file  Social History Narrative    Not on file   Social Determinants of Health   Financial Resource Strain: Not on file  Food Insecurity: Not on file  Transportation Needs: Not on file  Physical Activity: Not on file  Stress: Not on file  Social Connections: Not on file  Intimate Partner Violence: Not on file    Outpatient Medications Prior to Visit  Medication Sig Dispense Refill   amitriptyline (ELAVIL) 10 MG tablet Take 10 mg by mouth at bedtime as needed for sleep.     clonazePAM (KLONOPIN) 0.5 MG tablet Take 1 tablet (0.5 mg total) by mouth 2 (two) times daily as needed for anxiety. 60 tablet 0   escitalopram (LEXAPRO) 10 MG tablet Take 10 mg by mouth daily.     ibuprofen (ADVIL) 200 MG tablet Take 400-800 mg by mouth every 6 (six) hours as needed for moderate pain, headache or cramping.     omeprazole (PRILOSEC) 40 MG capsule Take 1 capsule (40 mg total) by mouth 2 (two) times daily. 90 capsule 3   traMADol-acetaminophen (ULTRACET) 37.5-325 MG tablet Take 1 tablet by mouth every 6 (six) hours as needed. 30 tablet 0   doxycycline (VIBRAMYCIN) 100 MG capsule Take 100 mg  by mouth daily.     No facility-administered medications prior to visit.    No Known Allergies  Review of Systems  Constitutional:  Negative for fever.  HENT:  Negative for congestion, ear pain, hearing loss, sinus pain and sore throat.   Eyes:  Negative for blurred vision and pain.  Respiratory:  Negative for cough, sputum production, shortness of breath and wheezing.   Cardiovascular:  Negative for chest pain and palpitations.  Gastrointestinal:  Negative for blood in stool, constipation, diarrhea, nausea and vomiting.  Genitourinary:  Negative for dysuria, frequency, hematuria and urgency.  Musculoskeletal:  Negative for back pain, falls and myalgias.  Neurological:  Negative for dizziness, sensory change, loss of consciousness, weakness and headaches.  Endo/Heme/Allergies:  Negative for environmental allergies. Does not bruise/bleed  easily.  Psychiatric/Behavioral:  Negative for depression and suicidal ideas. The patient is not nervous/anxious and does not have insomnia.       Objective:    Physical Exam Vitals and nursing note reviewed.  Constitutional:      General: She is not in acute distress.    Appearance: Normal appearance. She is obese. She is not ill-appearing.  HENT:     Head: Normocephalic and atraumatic.     Right Ear: External ear normal.     Left Ear: External ear normal.  Eyes:     Extraocular Movements: Extraocular movements intact.     Pupils: Pupils are equal, round, and reactive to light.  Cardiovascular:     Rate and Rhythm: Normal rate and regular rhythm.     Pulses: Normal pulses.     Heart sounds: Normal heart sounds. No murmur heard.   No gallop.  Pulmonary:     Effort: Pulmonary effort is normal. No respiratory distress.     Breath sounds: Normal breath sounds. No wheezing, rhonchi or rales.  Abdominal:     General: Bowel sounds are normal. There is no distension.     Palpations: Abdomen is soft. There is no mass.     Tenderness: There is no abdominal tenderness. There is no guarding or rebound.     Hernia: No hernia is present.  Musculoskeletal:     Cervical back: Normal range of motion and neck supple.  Lymphadenopathy:     Cervical: No cervical adenopathy.  Skin:    General: Skin is warm and dry.  Neurological:     Mental Status: She is alert and oriented to person, place, and time.  Psychiatric:        Behavior: Behavior normal.    BP 110/60 (BP Location: Left Arm, Patient Position: Sitting, Cuff Size: Large)    Pulse (!) 114    Temp 98.7 F (37.1 C) (Oral)    Resp 18    Ht 5\' 7"  (1.702 m)    Wt 250 lb 6.4 oz (113.6 kg)    LMP 06/21/2021 (Approximate)    SpO2 97%    BMI 39.22 kg/m  Wt Readings from Last 3 Encounters:  07/17/21 250 lb 6.4 oz (113.6 kg)  07/04/21 250 lb (113.4 kg)  06/12/21 251 lb 6.4 oz (114 kg)    Diabetic Foot Exam - Simple   No data filed     Lab Results  Component Value Date   WBC 6.9 07/04/2021   HGB 10.4 (L) 07/04/2021   HCT 31.9 (L) 07/04/2021   PLT 345 07/04/2021   GLUCOSE 99 07/04/2021   CHOL 140 07/28/2020   TRIG 67.0 07/28/2020   HDL 44.70 07/28/2020  LDLCALC 82 07/28/2020   ALT 9 03/06/2021   AST 14 (L) 03/06/2021   NA 133 (L) 07/04/2021   K 3.6 07/04/2021   CL 101 07/04/2021   CREATININE 0.62 07/04/2021   BUN 9 07/04/2021   CO2 23 07/04/2021   TSH 1.83 03/28/2021   INR 1.0 04/27/2021   HGBA1C 5.6 07/11/2020    Lab Results  Component Value Date   TSH 1.83 03/28/2021   Lab Results  Component Value Date   WBC 6.9 07/04/2021   HGB 10.4 (L) 07/04/2021   HCT 31.9 (L) 07/04/2021   MCV 81.4 07/04/2021   PLT 345 07/04/2021   Lab Results  Component Value Date   NA 133 (L) 07/04/2021   K 3.6 07/04/2021   CO2 23 07/04/2021   GLUCOSE 99 07/04/2021   BUN 9 07/04/2021   CREATININE 0.62 07/04/2021   BILITOT 0.3 03/06/2021   ALKPHOS 90 03/06/2021   AST 14 (L) 03/06/2021   ALT 9 03/06/2021   PROT 7.3 03/06/2021   ALBUMIN 4.3 03/06/2021   CALCIUM 9.2 07/04/2021   ANIONGAP 9 07/04/2021   GFR 124.74 07/28/2020   Lab Results  Component Value Date   CHOL 140 07/28/2020   Lab Results  Component Value Date   HDL 44.70 07/28/2020   Lab Results  Component Value Date   LDLCALC 82 07/28/2020   Lab Results  Component Value Date   TRIG 67.0 07/28/2020   Lab Results  Component Value Date   CHOLHDL 3 07/28/2020   Lab Results  Component Value Date   HGBA1C 5.6 07/11/2020       Assessment & Plan:   Problem List Items Addressed This Visit       Unprioritized   Generalized anxiety disorder    Stable       GERD (gastroesophageal reflux disease) - Primary    con't omeprazole       Intractable migraine without aura and without status migrainosus    Stable On elavil       Obesity (BMI 30-39.9)    Paperwork filled out for bariatric surgery       Other Visit Diagnoses      Dizziness       Relevant Orders   CBC with Differential/Platelet   Vitamin B12   VITAMIN D 25 Hydroxy (Vit-D Deficiency, Fractures)   TSH   Ferritin   B12 deficiency       Relevant Medications   cyanocobalamin ((VITAMIN B-12)) injection 1,000 mcg        Meds ordered this encounter  Medications   cyanocobalamin ((VITAMIN B-12)) injection 1,000 mcg    I,Jodi Norman,acting as a scribe for [employer], DO.,have documented all relevant documentation on the behalf of Jodi Held, DO,as directed by  Jodi Held, DO while in the presence of [place], DO. , personally preformed the services described in this documentation.  All medical record entries made by the scribe were at my direction and in my presence.  I have reviewed the chart and discharge instructions (if applicable) and agree that the record reflects my personal performance and is accurate and complete. 07/17/2021

## 2021-07-17 NOTE — Assessment & Plan Note (Signed)
Paperwork filled out for bariatric surgery

## 2021-07-18 ENCOUNTER — Telehealth: Payer: Self-pay

## 2021-07-18 ENCOUNTER — Other Ambulatory Visit: Payer: Self-pay

## 2021-07-18 ENCOUNTER — Ambulatory Visit (HOSPITAL_BASED_OUTPATIENT_CLINIC_OR_DEPARTMENT_OTHER): Payer: Medicaid Other | Attending: Cardiology | Admitting: Cardiovascular Disease

## 2021-07-18 DIAGNOSIS — R5383 Other fatigue: Secondary | ICD-10-CM

## 2021-07-18 DIAGNOSIS — G4700 Insomnia, unspecified: Secondary | ICD-10-CM

## 2021-07-18 DIAGNOSIS — R400 Somnolence: Secondary | ICD-10-CM

## 2021-07-18 LAB — CBC WITH DIFFERENTIAL/PLATELET
Basophils Absolute: 0.1 10*3/uL (ref 0.0–0.1)
Basophils Relative: 0.9 % (ref 0.0–3.0)
Eosinophils Absolute: 0.1 10*3/uL (ref 0.0–0.7)
Eosinophils Relative: 1.8 % (ref 0.0–5.0)
HCT: 33.5 % — ABNORMAL LOW (ref 36.0–46.0)
Hemoglobin: 11.1 g/dL — ABNORMAL LOW (ref 12.0–15.0)
Lymphocytes Relative: 33.1 % (ref 12.0–46.0)
Lymphs Abs: 2.2 10*3/uL (ref 0.7–4.0)
MCHC: 33 g/dL (ref 30.0–36.0)
MCV: 79 fl (ref 78.0–100.0)
Monocytes Absolute: 0.6 10*3/uL (ref 0.1–1.0)
Monocytes Relative: 8.6 % (ref 3.0–12.0)
Neutro Abs: 3.8 10*3/uL (ref 1.4–7.7)
Neutrophils Relative %: 55.6 % (ref 43.0–77.0)
Platelets: 382 10*3/uL (ref 150.0–400.0)
RBC: 4.24 Mil/uL (ref 3.87–5.11)
RDW: 15.7 % — ABNORMAL HIGH (ref 11.5–15.5)
WBC: 6.8 10*3/uL (ref 4.0–10.5)

## 2021-07-18 LAB — FERRITIN: Ferritin: 8.5 ng/mL — ABNORMAL LOW (ref 10.0–291.0)

## 2021-07-18 LAB — VITAMIN B12: Vitamin B-12: >1504 pg/mL — ABNORMAL HIGH (ref 211–911)

## 2021-07-18 LAB — VITAMIN D 25 HYDROXY (VIT D DEFICIENCY, FRACTURES): VITD: 18.63 ng/mL — ABNORMAL LOW (ref 30.00–100.00)

## 2021-07-18 LAB — TSH: TSH: 1.6 u[IU]/mL (ref 0.35–5.50)

## 2021-07-18 NOTE — Telephone Encounter (Signed)
Form and letter completed and faxed to [facility]

## 2021-07-19 ENCOUNTER — Other Ambulatory Visit: Payer: Self-pay | Admitting: Cardiology

## 2021-07-19 ENCOUNTER — Telehealth: Payer: Self-pay | Admitting: Cardiology

## 2021-07-19 ENCOUNTER — Telehealth: Payer: Self-pay | Admitting: *Deleted

## 2021-07-19 DIAGNOSIS — E669 Obesity, unspecified: Secondary | ICD-10-CM

## 2021-07-19 DIAGNOSIS — R400 Somnolence: Secondary | ICD-10-CM

## 2021-07-19 DIAGNOSIS — G4700 Insomnia, unspecified: Secondary | ICD-10-CM

## 2021-07-19 DIAGNOSIS — R5383 Other fatigue: Secondary | ICD-10-CM

## 2021-07-19 NOTE — Telephone Encounter (Signed)
Received a call from th patient requesting to change her HST to a in lab sleep study. She was informed that a in lab was denied by her insurance in December due to no comorbidities. She continues to state that she was approved and she does not know why her insurance approved it last year but will not now. After going back and forth with her and reviewing her chart, which only shows HST being scheduled she requested to speak directly with Dr Harriet Masson. I told her that I would be happy to give Dr Harriet Masson a message to call her.

## 2021-07-19 NOTE — Telephone Encounter (Signed)
I got a request for call back for the patient.  I was able to call back.  She had a question regarding her sleep study.  She is currently approved for home sleep study and requesting an in lab study.  Explained to the patient that based on clinical picture without significant core mobilities the home sleep study is not unreasonable.  I explained to her that this was the basis of the approval from the insurance company for home sleep study.  I did tell the patient I will be best to do the home sleep study and there can be further steps if the study is inconclusive or does suggest sleep apnea in lab study could be performed.  The patient does not appear to be satisfied with this plan of action and she states that if she was approved last year for sleep study why not this year.  Last year the patient was also approved for home sleep study not in lab study.  I did explain to the patient that if she was not happy with this plan she could also further discuss this with her primary care provider for a different plan of action if needed.

## 2021-07-27 ENCOUNTER — Other Ambulatory Visit: Payer: Self-pay

## 2021-07-27 MED ORDER — VITAMIN D (ERGOCALCIFEROL) 1.25 MG (50000 UNIT) PO CAPS: 50000.0000 [IU] | ORAL_CAPSULE | ORAL | 1 refills | Status: DC

## 2021-08-08 ENCOUNTER — Encounter: Payer: Self-pay | Admitting: Family Medicine

## 2021-08-08 ENCOUNTER — Ambulatory Visit (INDEPENDENT_AMBULATORY_CARE_PROVIDER_SITE_OTHER): Payer: No Typology Code available for payment source | Admitting: Family Medicine

## 2021-08-08 VITALS — BP 110/60 | HR 95 | Temp 98.8°F | Ht 67.0 in | Wt 253.8 lb

## 2021-08-08 DIAGNOSIS — G8929 Other chronic pain: Secondary | ICD-10-CM

## 2021-08-08 DIAGNOSIS — R42 Dizziness and giddiness: Secondary | ICD-10-CM

## 2021-08-08 DIAGNOSIS — M25562 Pain in left knee: Secondary | ICD-10-CM | POA: Diagnosis not present

## 2021-08-08 DIAGNOSIS — R200 Anesthesia of skin: Secondary | ICD-10-CM

## 2021-08-08 DIAGNOSIS — Z0000 Encounter for general adult medical examination without abnormal findings: Secondary | ICD-10-CM

## 2021-08-08 NOTE — Progress Notes (Signed)
Subjective:  ?  ? Jodi Norman is a 26 y.o. female and is here for a comprehensive physical exam. The patient reports problems - numbness in lip/ pain and dizziness---- pt had 6 teeth pulled and then she started with these symptoms but dentist said pulling the teeth did not cause it.  He advised she see a neurologist.   . ? ?Social History  ? ?Socioeconomic History  ? Marital status: Single  ?  Spouse name: Not on file  ? Number of children: Not on file  ? Years of education: Not on file  ? Highest education level: Not on file  ?Occupational History  ? Not on file  ?Tobacco Use  ? Smoking status: Never  ? Smokeless tobacco: Never  ?Vaping Use  ? Vaping Use: Never used  ?Substance and Sexual Activity  ? Alcohol use: Never  ? Drug use: Never  ? Sexual activity: Not Currently  ?  Birth control/protection: None  ?Other Topics Concern  ? Not on file  ?Social History Narrative  ? Not on file  ? ?Social Determinants of Health  ? ?Financial Resource Strain: Not on file  ?Food Insecurity: Not on file  ?Transportation Needs: Not on file  ?Physical Activity: Not on file  ?Stress: Not on file  ?Social Connections: Not on file  ?Intimate Partner Violence: Not on file  ? ?Health Maintenance  ?Topic Date Due  ? COVID-19 Vaccine (1) Never done  ? HPV VACCINES (1 - 2-dose series) Never done  ? HIV Screening  Never done  ? PAP-Cervical Cytology Screening  Never done  ? PAP SMEAR-Modifier  Never done  ? INFLUENZA VACCINE  12/26/2020  ? TETANUS/TDAP  03/03/2028  ? Hepatitis C Screening  Completed  ? ? ?The following portions of the patient's history were reviewed and updated as appropriate: She  has a past medical history of Generalized anxiety disorder (06/24/2020) and GERD (gastroesophageal reflux disease). ?She does not have any pertinent problems on file. ?She  has a past surgical history that includes Cesarean section. ?Her family history includes Diabetes in her father; Hyperlipidemia in her father and mother; Hypertension in  her father and mother. ?She  reports that she has never smoked. She has never used smokeless tobacco. She reports that she does not drink alcohol and does not use drugs. ?She has a current medication list which includes the following prescription(s): clonazepam, ibuprofen, omeprazole, vitamin d (ergocalciferol), amitriptyline, escitalopram, and tramadol-acetaminophen, and the following Facility-Administered Medications: cyanocobalamin. ?Current Outpatient Medications on File Prior to Visit  ?Medication Sig Dispense Refill  ? clonazePAM (KLONOPIN) 0.5 MG tablet Take 1 tablet (0.5 mg total) by mouth 2 (two) times daily as needed for anxiety. 60 tablet 0  ? ibuprofen (ADVIL) 200 MG tablet Take 400-800 mg by mouth every 6 (six) hours as needed for moderate pain, headache or cramping.    ? omeprazole (PRILOSEC) 40 MG capsule Take 1 capsule (40 mg total) by mouth 2 (two) times daily. 90 capsule 3  ? Vitamin D, Ergocalciferol, (DRISDOL) 1.25 MG (50000 UNIT) CAPS capsule Take 1 capsule (50,000 Units total) by mouth every 7 (seven) days. 12 capsule 1  ? amitriptyline (ELAVIL) 10 MG tablet Take 10 mg by mouth at bedtime as needed for sleep. (Patient not taking: Reported on 08/08/2021)    ? escitalopram (LEXAPRO) 10 MG tablet Take 10 mg by mouth daily. (Patient not taking: Reported on 08/08/2021)    ? traMADol-acetaminophen (ULTRACET) 37.5-325 MG tablet Take 1 tablet by mouth every  6 (six) hours as needed. (Patient not taking: Reported on 08/08/2021) 30 tablet 0  ? ?Current Facility-Administered Medications on File Prior to Visit  ?Medication Dose Route Frequency Provider Last Rate Last Admin  ? cyanocobalamin ((VITAMIN B-12)) injection 1,000 mcg  1,000 mcg Intramuscular Q30 days Carollee Herter, Kendrick Fries R, DO   1,000 mcg at 07/17/21 1444  ? ?She has No Known Allergies.. ? ?Review of Systems ?Review of Systems  ?Constitutional: Negative for activity change, appetite change and fatigue.  ?HENT: Negative for hearing loss, congestion,  tinnitus and ear discharge.  dentist q55m?Eyes: Negative for visual disturbance (see optho q1y -- vision corrected to 20/20 with glasses).  ?Respiratory: Negative for cough, chest tightness and shortness of breath.   ?Cardiovascular: Negative for chest pain, palpitations and leg swelling.  ?Gastrointestinal: Negative for abdominal pain, diarrhea, constipation and abdominal distention.  ?Genitourinary: Negative for urgency, frequency, decreased urine volume and difficulty urinating.  ?Musculoskeletal: Negative for back pain, arthralgias and gait problem.  ?Skin: Negative for color change, pallor and rash.  ?Neurological: Negative for dizziness, light-headedness, numbness and headaches.  ?Hematological: Negative for adenopathy. Does not bruise/bleed easily.  ?Psychiatric/Behavioral: Negative for suicidal ideas, confusion, sleep disturbance, self-injury, dysphoric mood, decreased concentration and agitation.  ? ? ? ? ?Objective:  ? ? BP 110/60   Pulse 95   Temp 98.8 ?F (37.1 ?C) (Oral)   Ht '5\' 7"'$  (1.702 m)   Wt 253 lb 12.8 oz (115.1 kg)   LMP 07/11/2021   SpO2 98%   BMI 39.75 kg/m?  ?General appearance: alert, cooperative, appears stated age, and no distress ?Head: Normocephalic, without obvious abnormality, atraumatic ?Eyes: conjunctivae/corneas clear. PERRL, EOM's intact. Fundi benign. ?Ears: normal TM's and external ear canals both ears ?Nose: Nares normal. Septum midline. Mucosa normal. No drainage or sinus tenderness. ?Throat: lips, mucosa, and tongue normal; teeth and gums normal ?Neck: no adenopathy, no carotid bruit, no JVD, supple, symmetrical, trachea midline, and thyroid not enlarged, symmetric, no tenderness/mass/nodules ?Back: symmetric, no curvature. ROM normal. No CVA tenderness. ?Lungs: clear to auscultation bilaterally ?Heart: regular rate and rhythm, S1, S2 normal, no murmur, click, rub or gallop ?Abdomen: soft, non-tender; bowel sounds normal; no masses,  no organomegaly ?Extremities:  extremities normal, atraumatic, no cyanosis or edema ?Pulses: 2+ and symmetric ?Skin: Skin color, texture, turgor normal. No rashes or lesions ?Lymph nodes: Cervical, supraclavicular, and axillary nodes normal. ?Neurologic: Alert and oriented X 3, normal strength and tone. Normal symmetric reflexes. Normal coordination and gait  ?  ?Assessment:  ? ? Healthy female exam.    ?  ?Plan:  ? ? Ghm utd ?Check labs  ?See After Visit Summary for Counseling Recommendations  ? ?1. Dizziness ?Per dentist  ?- Ambulatory referral to Neurology ? ?2. Numbness of lip ? ?- Ambulatory referral to Neurology ? ? ? ?3 Acute pain of left knee ?Voltaren gel otc  ?- DG Knee Complete 4 Views Left; Future ? ?5. Preventative health care ?See above  ?- Lipid panel ?- Comprehensive metabolic panel ? ? ?

## 2021-08-08 NOTE — Patient Instructions (Signed)
Preventive Care 61-26 Years Old, Female ?Preventive care refers to lifestyle choices and visits with your health care provider that can promote health and wellness. Preventive care visits are also called wellness exams. ?What can I expect for my preventive care visit? ?Counseling ?During your preventive care visit, your health care provider may ask about your: ?Medical history, including: ?Past medical problems. ?Family medical history. ?Pregnancy history. ?Current health, including: ?Menstrual cycle. ?Method of birth control. ?Emotional well-being. ?Home life and relationship well-being. ?Sexual activity and sexual health. ?Lifestyle, including: ?Alcohol, nicotine or tobacco, and drug use. ?Access to firearms. ?Diet, exercise, and sleep habits. ?Work and work Statistician. ?Sunscreen use. ?Safety issues such as seatbelt and bike helmet use. ?Physical exam ?Your health care provider may check your: ?Height and weight. These may be used to calculate your BMI (body mass index). BMI is a measurement that tells if you are at a healthy weight. ?Waist circumference. This measures the distance around your waistline. This measurement also tells if you are at a healthy weight and may help predict your risk of certain diseases, such as type 2 diabetes and high blood pressure. ?Heart rate and blood pressure. ?Body temperature. ?Skin for abnormal spots. ?What immunizations do I need? ?Vaccines are usually given at various ages, according to a schedule. Your health care provider will recommend vaccines for you based on your age, medical history, and lifestyle or other factors, such as travel or where you work. ?What tests do I need? ?Screening ?Your health care provider may recommend screening tests for certain conditions. This may include: ?Pelvic exam and Pap test. ?Lipid and cholesterol levels. ?Diabetes screening. This is done by checking your blood sugar (glucose) after you have not eaten for a while (fasting). ?Hepatitis B  test. ?Hepatitis C test. ?HIV (human immunodeficiency virus) test. ?STI (sexually transmitted infection) testing, if you are at risk. ?BRCA-related cancer screening. This may be done if you have a family history of breast, ovarian, tubal, or peritoneal cancers. ?Talk with your health care provider about your test results, treatment options, and if necessary, the need for more tests. ?Follow these instructions at home: ?Eating and drinking ? ?Eat a healthy diet that includes fresh fruits and vegetables, whole grains, lean protein, and low-fat dairy products. ?Take vitamin and mineral supplements as recommended by your health care provider. ?Do not drink alcohol if: ?Your health care provider tells you not to drink. ?You are pregnant, may be pregnant, or are planning to become pregnant. ?If you drink alcohol: ?Limit how much you have to 0-1 drink a day. ?Know how much alcohol is in your drink. In the [place] one drink equals one 12 oz bottle of beer (355 mL), one 5 oz glass of wine (148 mL), or one 1? oz glass of hard liquor (44 mL). ?Lifestyle ?Brush your teeth every morning and night with fluoride toothpaste. Floss one time each day. ?Exercise for at least 30 minutes 5 or more days each week. ?Do not use any products that contain nicotine or tobacco. These products include cigarettes, chewing tobacco, and vaping devices, such as e-cigarettes. If you need help quitting, ask your health care provider. ?Do not use drugs. ?If you are sexually active, practice safe sex. Use a condom or other form of protection to prevent STIs. ?If you do not wish to become pregnant, use a form of birth control. If you plan to become pregnant, see your health care provider for a prepregnancy visit. ?Find healthy ways to manage stress, such as: ?Meditation, yoga,  or listening to music. ?Journaling. ?Talking to a trusted person. ?Spending time with friends and family. ?Minimize exposure to UV radiation to reduce your risk of skin  cancer. ?Safety ?Always wear your seat belt while driving or riding in a vehicle. ?Do not drive: ?If you have been drinking alcohol. Do not ride with someone who has been drinking. ?If you have been using any mind-altering substances or drugs. ?While texting. ?When you are tired or distracted. ?Wear a helmet and other protective equipment during sports activities. ?If you have firearms in your house, make sure you follow all gun safety procedures. ?Seek help if you have been physically or sexually abused. ?What's next? ?Go to your health care provider once a year for an annual wellness visit. ?Ask your health care provider how often you should have your eyes and teeth checked. ?Stay up to date on all vaccines. ?This information is not intended to replace advice given to you by your health care provider. Make sure you discuss any questions you have with your health care provider. ?Document Revised: 11/09/2020 Document Reviewed: 11/09/2020 ?Elsevier Patient Education ? [place]. ? ?

## 2021-08-09 LAB — LIPID PANEL
Cholesterol: 135 mg/dL (ref 0–200)
HDL: 32.1 mg/dL — ABNORMAL LOW (ref >39.00)
LDL Cholesterol: 73 mg/dL (ref 0–99)
NonHDL: 102.6
Total CHOL/HDL Ratio: 4
Triglycerides: 148 mg/dL (ref 0.0–149.0)
VLDL: 29.6 mg/dL (ref 0.0–40.0)

## 2021-08-09 LAB — COMPREHENSIVE METABOLIC PANEL
ALT: 9 U/L (ref 0–35)
AST: 14 U/L (ref 0–37)
Albumin: 4.1 g/dL (ref 3.5–5.2)
Alkaline Phosphatase: 95 U/L (ref 39–117)
BUN: 8 mg/dL (ref 6–23)
CO2: 26 mEq/L (ref 19–32)
Calcium: 9.2 mg/dL (ref 8.4–10.5)
Chloride: 104 mEq/L (ref 96–112)
Creatinine, Ser: 0.64 mg/dL (ref 0.40–1.20)
GFR: 122.9 mL/min (ref >60.00)
Glucose, Bld: 95 mg/dL (ref 70–99)
Potassium: 3.8 mEq/L (ref 3.5–5.1)
Sodium: 136 mEq/L (ref 135–145)
Total Bilirubin: 0.2 mg/dL (ref 0.2–1.2)
Total Protein: 7.6 g/dL (ref 6.0–8.3)

## 2021-08-10 ENCOUNTER — Telehealth: Payer: Self-pay

## 2021-08-10 ENCOUNTER — Other Ambulatory Visit: Payer: Self-pay | Admitting: Family Medicine

## 2021-08-10 NOTE — Telephone Encounter (Signed)
Pt called- she was seen for her cpx on 08/08/21- she forgot to mention that her cardiologist requested PCP order a sleep study, they have previously tried but her insurance requires order to be from PCP. She may be reached at [phone] for further questions ?

## 2021-08-11 ENCOUNTER — Encounter: Payer: Self-pay | Admitting: Neurology

## 2021-08-11 NOTE — Telephone Encounter (Signed)
Pt called to follow up on sleep study request.  I advised pt request has been forwarded to PCP and will be handled as soon as PCP has time to address her messages. ?

## 2021-08-15 ENCOUNTER — Other Ambulatory Visit (HOSPITAL_COMMUNITY): Payer: Self-pay | Admitting: General Surgery

## 2021-08-15 ENCOUNTER — Telehealth: Payer: Self-pay

## 2021-08-15 DIAGNOSIS — E786 Lipoprotein deficiency: Secondary | ICD-10-CM

## 2021-08-15 DIAGNOSIS — D509 Iron deficiency anemia, unspecified: Secondary | ICD-10-CM

## 2021-08-15 DIAGNOSIS — K2100 Gastro-esophageal reflux disease with esophagitis, without bleeding: Secondary | ICD-10-CM

## 2021-08-15 DIAGNOSIS — Z0289 Encounter for other administrative examinations: Secondary | ICD-10-CM

## 2021-08-15 DIAGNOSIS — E559 Vitamin D deficiency, unspecified: Secondary | ICD-10-CM

## 2021-08-15 DIAGNOSIS — K449 Diaphragmatic hernia without obstruction or gangrene: Secondary | ICD-10-CM

## 2021-08-15 NOTE — Telephone Encounter (Signed)
? ? ?  Name: Jodi Norman  ?DOB: 09-19-1995  ?MRN: [id number] ? ?Primary Cardiologist: Berniece Salines, DO ? ? ?Preoperative team, please contact this patient and set up a phone call appointment for further preoperative risk assessment. Please obtain consent and complete medication review. Thank you for your help. ? ? ?Ledora Bottcher, PA-C ?08/15/2021, 8:32 AM ?[phone] ?[name] ?[address] ?[place],  [address] ? ? ?

## 2021-08-15 NOTE — Telephone Encounter (Signed)
? ?  Pre-operative Risk Assessment  ?  ?Patient Name: Jodi Norman  ?DOB: 01/06/1996 ?MRN: [id number]  ? ?  ? ?Request for Surgical Clearance   ? ?Procedure:   BARIATRIC GASTRIC BYPASS ? ?Date of Surgery:  Clearance TBD                              ?   ?Surgeon:  DR ERIC WILSON ?Surgeon's Group or Practice Name:  [facility]  ATTN: Lindwood Coke, RN ?Phone number:  [phone] ?Fax number:  [phone] ?  ?Type of Clearance Requested:   ?- Medical  ?  ?Type of Anesthesia:  General  ?  ?Additional requests/questions:   ?  ? ? ?

## 2021-08-16 ENCOUNTER — Telehealth: Payer: Self-pay | Admitting: *Deleted

## 2021-08-16 ENCOUNTER — Encounter (HOSPITAL_BASED_OUTPATIENT_CLINIC_OR_DEPARTMENT_OTHER): Payer: Self-pay

## 2021-08-16 ENCOUNTER — Other Ambulatory Visit (HOSPITAL_COMMUNITY): Payer: No Typology Code available for payment source

## 2021-08-16 ENCOUNTER — Ambulatory Visit (INDEPENDENT_AMBULATORY_CARE_PROVIDER_SITE_OTHER): Payer: No Typology Code available for payment source | Admitting: Family Medicine

## 2021-08-16 ENCOUNTER — Inpatient Hospital Stay (HOSPITAL_BASED_OUTPATIENT_CLINIC_OR_DEPARTMENT_OTHER): Admission: RE | Admit: 2021-08-16 | Payer: No Typology Code available for payment source | Source: Ambulatory Visit

## 2021-08-16 NOTE — Telephone Encounter (Signed)
PT SCHEDULED FOR TELE PRE OP APPT 08/18/21 @ 1 PM. PT STATES SHE WILL BE AT LUNCH AND NEEDS FOR [place] TO TRY TO PLEASE CALL HER A FEW MINUTES BEFORE 1 PM AS HER LUNCH IS OVER A T1:30.  ? ?MED REC AND CONSENT ARE DONE ? ?

## 2021-08-16 NOTE — Telephone Encounter (Signed)
MED REC AND CONSENT DONE ? ?  ?Patient Consent for Virtual Visit  ? ? ?   ? ?Jodi Norman has provided verbal consent on 08/16/2021 for a virtual visit (video or telephone). ? ? ?CONSENT FOR VIRTUAL VISIT FOR:  Jodi Norman  ?By participating in this virtual visit I agree to the following: ? ?I hereby voluntarily request, consent and authorize [facility] and its employed or contracted physicians, physician assistants, nurse practitioners or other licensed health care professionals (the Practitioner), to provide me with telemedicine health care services (the ?Services") as deemed necessary by the treating Practitioner. I acknowledge and consent to receive the Services by the Practitioner via telemedicine. I understand that the telemedicine visit will involve communicating with the Practitioner through live audiovisual communication technology and the disclosure of certain medical information by electronic transmission. I acknowledge that I have been given the opportunity to request an in-person assessment or other available alternative prior to the telemedicine visit and am voluntarily participating in the telemedicine visit. ? ?I understand that I have the right to withhold or withdraw my consent to the use of telemedicine in the course of my care at any time, without affecting my right to future care or treatment, and that the Practitioner or I may terminate the telemedicine visit at any time. I understand that I have the right to inspect all information obtained and/or recorded in the course of the telemedicine visit and may receive copies of available information for a reasonable fee.  I understand that some of the potential risks of receiving the Services via telemedicine include:  ?Delay or interruption in medical evaluation due to technological equipment failure or disruption; ?Information transmitted may not be sufficient (e.g. poor resolution of images) to allow for appropriate medical decision making  by the Practitioner; and/or  ?In rare instances, security protocols could fail, causing a breach of personal health information. ? ?Furthermore, I acknowledge that it is my responsibility to provide information about my medical history, conditions and care that is complete and accurate to the best of my ability. I acknowledge that Practitioner's advice, recommendations, and/or decision may be based on factors not within their control, such as incomplete or inaccurate data provided by me or distortions of diagnostic images or specimens that may result from electronic transmissions. I understand that the practice of medicine is not an exact science and that Practitioner makes no warranties or guarantees regarding treatment outcomes. I acknowledge that a copy of this consent can be made available to me via my patient portal ([facility]), or I can request a printed copy by calling the office of [facility].   ? ?I understand that my insurance will be billed for this visit.  ? ?I have read or had this consent read to me. ?I understand the contents of this consent, which adequately explains the benefits and risks of the Services being provided via telemedicine.  ?I have been provided ample opportunity to ask questions regarding this consent and the Services and have had my questions answered to my satisfaction. ?I give my informed consent for the services to be provided through the use of telemedicine in my medical care ? ? ? ?

## 2021-08-17 ENCOUNTER — Encounter: Payer: Self-pay | Admitting: Family Medicine

## 2021-08-17 ENCOUNTER — Telehealth (INDEPENDENT_AMBULATORY_CARE_PROVIDER_SITE_OTHER): Payer: No Typology Code available for payment source | Admitting: Family Medicine

## 2021-08-17 DIAGNOSIS — F988 Other specified behavioral and emotional disorders with onset usually occurring in childhood and adolescence: Secondary | ICD-10-CM | POA: Diagnosis not present

## 2021-08-17 DIAGNOSIS — D508 Other iron deficiency anemias: Secondary | ICD-10-CM | POA: Diagnosis not present

## 2021-08-17 DIAGNOSIS — D509 Iron deficiency anemia, unspecified: Secondary | ICD-10-CM | POA: Insufficient documentation

## 2021-08-17 DIAGNOSIS — R0683 Snoring: Secondary | ICD-10-CM | POA: Diagnosis not present

## 2021-08-17 NOTE — Assessment & Plan Note (Signed)
Pt has not been taken her iron regularly  ?She will start  ?F/u 1 month  ?

## 2021-08-17 NOTE — Progress Notes (Signed)
MyChart Video Visit    Virtual Visit via Video Note   This visit type was conducted due to national recommendations for restrictions regarding the COVID-19 Pandemic (e.g. social distancing) in an effort to limit this patient's exposure and mitigate transmission in our community. This patient is at least at moderate risk for complications without adequate follow up. This format is felt to be most appropriate for this patient at this time. Physical exam was limited by quality of the video and audio technology used for the visit. [name] was able to get the patient set up on a video visit.  Patient location: Home Patient and provider in visit Provider location: Office  I discussed the limitations of evaluation and management by telemedicine and the availability of in person appointments. The patient expressed understanding and agreed to proceed.  Visit Date: 08/17/2021  Today's healthcare provider: Donato Schultz, DO     Subjective:    Patient ID: Jodi Norman, female    DOB: Nov 11, 1995, 26 y.o.   MRN: [id number]  No chief complaint on file.   HPI Patient is in today for a video visit.   She complains of losing focus at work easily. She finds simple tasks difficult to complete. She also finds herself feeling fatigued while completing routine tasks. She thinks she may have ADHD. She reports her mother thinks she may have had ADHD when she was a child. She reports her middle school recommended she gets evaluated for ADHD but she was opposed to it at the time. She has tried making an appointment with a psychologist but found they are not taking in any new patients. She is interested in setting up an appointment with a specialist to get tested.   She reports her hematologist recommended she receive iron infusions. She takes iron supplements but reports not being consistent in taking them.  Lab Results  Component Value Date   WBC 6.8 07/17/2021   HGB 11.1 (L)  07/17/2021   HCT 33.5 (L) 07/17/2021   MCV 79.0 07/17/2021   PLT 382.0 07/17/2021   Her cardiologist and hematologist recommended she gets a sleep study due to potential sleep apnea. She reports waking up several times at night and feeling thirsty. Her cardiologist though she may have it due to her neck size and her legs being swollen during their appointments. Her mother also has a history of sleep apnea. Her cardiologist tried to set up an appointment but had difficulties setting it up. She has had a prior appointment with a sleep study specialist for an at home evaluation but did not complete it due to the increased price and greater chance of failure.    Past Medical History:  Diagnosis Date   Generalized anxiety disorder 06/24/2020   GERD (gastroesophageal reflux disease)     Past Surgical History:  Procedure Laterality Date   CESAREAN SECTION      Family History  Problem Relation Age of Onset   Hyperlipidemia Mother    Hypertension Mother    Hyperlipidemia Father    Hypertension Father    Diabetes Father    Colon cancer Neg Hx    Stomach cancer Neg Hx    Esophageal cancer Neg Hx    Pancreatic cancer Neg Hx    Colon polyps Neg Hx    Rectal cancer Neg Hx     Social History   Socioeconomic History   Marital status: Single    Spouse name: Not on file   Number  of children: Not on file   Years of education: Not on file   Highest education level: Not on file  Occupational History   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[employer]  Tobacco Use   Smoking status: Never   Smokeless tobacco: Never  Vaping Use   Vaping Use: Never used  Substance and Sexual Activity   Alcohol use: Never   Drug use: Never   Sexual activity: Not Currently    Birth control/protection: None  Other Topics Concern   Not on file  Social History Narrative   Not on file   Social Determinants of Health   Financial Resource Strain: Not on file  Food Insecurity: Not on file   Transportation Needs: Not on file  Physical Activity: Not on file  Stress: Not on file  Social Connections: Not on file  Intimate Partner Violence: Not on file    Outpatient Medications Prior to Visit  Medication Sig Dispense Refill   amitriptyline (ELAVIL) 10 MG tablet Take 10 mg by mouth at bedtime as needed for sleep. (Patient not taking: Reported on 08/08/2021)     clonazePAM (KLONOPIN) 0.5 MG tablet Take 1 tablet (0.5 mg total) by mouth 2 (two) times daily as needed for anxiety. (Patient not taking: Reported on 08/16/2021) 60 tablet 0   escitalopram (LEXAPRO) 10 MG tablet Take 10 mg by mouth daily. (Patient not taking: Reported on 08/08/2021)     ibuprofen (ADVIL) 200 MG tablet Take 400-800 mg by mouth every 6 (six) hours as needed for moderate pain, headache or cramping.     omeprazole (PRILOSEC) 40 MG capsule Take 1 capsule (40 mg total) by mouth 2 (two) times daily. 90 capsule 3   traMADol-acetaminophen (ULTRACET) 37.5-325 MG tablet Take 1 tablet by mouth every 6 (six) hours as needed. (Patient not taking: Reported on 08/08/2021) 30 tablet 0   Vitamin D, Ergocalciferol, (DRISDOL) 1.25 MG (50000 UNIT) CAPS capsule Take 1 capsule (50,000 Units total) by mouth every 7 (seven) days. 12 capsule 1   Facility-Administered Medications Prior to Visit  Medication Dose Route Frequency Provider Last Rate Last Admin   cyanocobalamin ((VITAMIN B-12)) injection 1,000 mcg  1,000 mcg Intramuscular Q30 days Zola Button, Myrene Buddy R, DO   1,000 mcg at 07/17/21 1444    No Known Allergies  Review of Systems  Psychiatric/Behavioral:         (+)weak focus (+)increase mental fatigue during routine tasks      Objective:    Physical Exam Vitals and nursing note reviewed.  Pulmonary:     Effort: Pulmonary effort is normal.    There were no vitals taken for this visit. Wt Readings from Last 3 Encounters:  08/08/21 253 lb 12.8 oz (115.1 kg)  07/17/21 250 lb 6.4 oz (113.6 kg)  07/04/21 250 lb (113.4  kg)    Diabetic Foot Exam - Simple   No data filed    Lab Results  Component Value Date   WBC 6.8 07/17/2021   HGB 11.1 (L) 07/17/2021   HCT 33.5 (L) 07/17/2021   PLT 382.0 07/17/2021   GLUCOSE 95 08/08/2021   CHOL 135 08/08/2021   TRIG 148.0 08/08/2021   HDL 32.10 (L) 08/08/2021   LDLCALC 73 08/08/2021   ALT 9 08/08/2021   AST 14 08/08/2021   NA 136 08/08/2021   K 3.8 08/08/2021   CL 104 08/08/2021   CREATININE 0.64 08/08/2021   BUN 8 08/08/2021   CO2 26 08/08/2021   TSH 1.60  07/17/2021   INR 1.0 04/27/2021   HGBA1C 5.6 07/11/2020    Lab Results  Component Value Date   TSH 1.60 07/17/2021   Lab Results  Component Value Date   WBC 6.8 07/17/2021   HGB 11.1 (L) 07/17/2021   HCT 33.5 (L) 07/17/2021   MCV 79.0 07/17/2021   PLT 382.0 07/17/2021   Lab Results  Component Value Date   NA 136 08/08/2021   K 3.8 08/08/2021   CO2 26 08/08/2021   GLUCOSE 95 08/08/2021   BUN 8 08/08/2021   CREATININE 0.64 08/08/2021   BILITOT 0.2 08/08/2021   ALKPHOS 95 08/08/2021   AST 14 08/08/2021   ALT 9 08/08/2021   PROT 7.6 08/08/2021   ALBUMIN 4.1 08/08/2021   CALCIUM 9.2 08/08/2021   ANIONGAP 9 07/04/2021   GFR 122.90 08/08/2021   Lab Results  Component Value Date   CHOL 135 08/08/2021   Lab Results  Component Value Date   HDL 32.10 (L) 08/08/2021   Lab Results  Component Value Date   LDLCALC 73 08/08/2021   Lab Results  Component Value Date   TRIG 148.0 08/08/2021   Lab Results  Component Value Date   CHOLHDL 4 08/08/2021   Lab Results  Component Value Date   HGBA1C 5.6 07/11/2020       Assessment & Plan:   Problem List Items Addressed This Visit   None Visit Diagnoses     Attention deficit disorder (ADD) without hyperactivity    -  Primary   Relevant Orders   Ambulatory referral to Psychology   Snoring       Relevant Orders   Ambulatory referral to Pulmonology        No orders of the defined types were placed in this  encounter.   I discussed the assessment and treatment plan with the patient. The patient was provided an opportunity to ask questions and all were answered. The patient agreed with the plan and demonstrated an understanding of the instructions.   The patient was advised to call back or seek an in-person evaluation if the symptoms worsen or if the condition fails to improve as anticipated.  I,Shehryar Baig,acting as a Neurosurgeon for [employer], DO.,have documented all relevant documentation on the behalf of Donato Schultz, DO,as directed by  Donato Schultz, DO while in the presence of Donato Schultz, DO.  I provided 20 minutes of face-to-face time during this encounter.   Donato Schultz, DO [facility] [employer] [phone] (phone) [phone] (fax)  [facility]

## 2021-08-18 ENCOUNTER — Other Ambulatory Visit: Payer: Self-pay

## 2021-08-18 ENCOUNTER — Ambulatory Visit (INDEPENDENT_AMBULATORY_CARE_PROVIDER_SITE_OTHER): Payer: No Typology Code available for payment source | Admitting: General Practice

## 2021-08-18 DIAGNOSIS — Z01810 Encounter for preprocedural cardiovascular examination: Secondary | ICD-10-CM

## 2021-08-18 NOTE — Progress Notes (Signed)
? ?Virtual Visit via Telephone Note  ? ?This visit type was conducted due to national recommendations for restrictions regarding the COVID-19 Pandemic (e.g. social distancing) in an effort to limit this patient's exposure and mitigate transmission in our community.  Due to her co-morbid illnesses, this patient is at least at moderate risk for complications without adequate follow up.  This format is felt to be most appropriate for this patient at this time.  The patient did not have access to video technology/had technical difficulties with video requiring transitioning to audio format only (telephone).  All issues noted in this document were discussed and addressed.  No physical exam could be performed with this format.  Please refer to the patient's chart for her  consent to telehealth for [facility]. ?Evaluation Performed:  Preoperative cardiovascular risk assessment ? ?This visit type was conducted due to national recommendations for restrictions regarding the COVID-19 Pandemic (e.g. social distancing).  This format is felt to be most appropriate for this patient at this time.  All issues noted in this document were discussed and addressed.  No physical exam was performed (except for noted visual exam findings with Video Visits).  Please refer to the patient's chart (MyChart message for video visits and phone note for telephone visits) for the patient's consent to telehealth for [facility]. ?_____________  ? ?Date:  08/18/2021  ? ?Patient ID:  Jodi Norman, DOB 1995/09/16, MRN [id number] ?Patient Location:  ?Home ?Provider location:   ?Office ? ?Primary Care Provider:  Carollee Herter, Alferd Apa, DO ?Primary Cardiologist:  Berniece Salines, DO ? ?Chief Complaint  ?  ?26 y.o. y/o female with a h/o daytime somnolence, obesity, fatigue, who is pending bariatric gastric bypass surgery, and presents today for telephonic preoperative cardiovascular risk assessment. ? ?Past Medical History  ?  ?Past Medical History:   ?Diagnosis Date  ? Generalized anxiety disorder 06/24/2020  ? GERD (gastroesophageal reflux disease)   ? ?Past Surgical History:  ?Procedure Laterality Date  ? CESAREAN SECTION    ? ? ?Allergies ? ?No Known Allergies ? ?History of Present Illness  ?  ?Jodi Norman is a 26 y.o. female who presents via Engineer, civil (consulting) for a telehealth visit today.  Pt was last seen in cardiology clinic on 03/22/21, by Dr. Harriet Masson.  At that time Jodi Norman was experiencing fatigue.  An echocardiogram was ordered and showed normal LV function with normal diastolic parameters and no significant valvular abnormalities.  she is now pending bariatric gastric bypass surgery.  Since his last visit, she remained stable from a cardiac standpoint. ? ?Today she denies chest pain, shortness of breath, lower extremity edema, fatigue, palpitations, melena, hematuria, hemoptysis, diaphoresis, weakness, presyncope, syncope, orthopnea, and PND. ? ? ? ?Home Medications  ?  ?Prior to Admission medications   ?Medication Sig Start Date End Date Taking? Authorizing Provider  ?ibuprofen (ADVIL) 200 MG tablet Take 400-800 mg by mouth every 6 (six) hours as needed for moderate pain, headache or cramping.    [provider]  ?omeprazole (PRILOSEC) 40 MG capsule Take 1 capsule (40 mg total) by mouth 2 (two) times daily. 05/19/21   Milus Banister, MD  ?Vitamin D, Ergocalciferol, (DRISDOL) 1.25 MG (50000 UNIT) CAPS capsule Take 1 capsule (50,000 Units total) by mouth every 7 (seven) days. 07/27/21   Ann Held, DO  ? ? ?Physical Exam  ?  ?Vital Signs:  Jodi Norman does not have vital signs available for review today. ? ?Given telephonic  nature of communication, physical exam is limited. ?AAOx3. NAD. Normal affect.  Speech and respirations are unlabored. ? ?Accessory Clinical Findings  ?  ?None ? ?Assessment & Plan  ?  ?1.  Preoperative Cardiovascular Risk Assessment: ? ?  ? ?Primary Cardiologist: Berniece Salines, DO ? ?Chart  reviewed as part of pre-operative protocol coverage. Given past medical history and time since last visit, based on ACC/AHA guidelines, Jodi Norman would be at acceptable risk for the planned procedure without further cardiovascular testing.  ? ?Patient was advised that if she develops new symptoms prior to surgery to contact our office to arrange a follow-up appointment.  He verbalized understanding. ? ? ?Her RCRI is a class I risk, 0.4% risk of major cardiac event.  She is Norman to complete greater than 4 METS of physical activity. ? ? ?COVID-19 Education: ?The signs and symptoms of COVID-19 were discussed with the patient and how to seek care for testing (follow up with PCP or arrange E-visit).  The importance of social distancing was discussed today. ? ?Patient Risk:   ?After full review of this patient's history and clinical status, I feel that he is at least moderate risk for cardiac complications at this time, thus necessitating a telehealth visit sooner than our first available in office visit. ? ?Time:   ?Today, I have spent 5 minutes with the patient with telehealth technology discussing medical history, symptoms, and management plan.  I spent greater than 10 minutes reviewing patient's past medical history and medications prior to phone visit. ? ? ?Deberah Pelton, NP ? ?08/18/2021, 10:29 AM ? ?

## 2021-08-21 ENCOUNTER — Ambulatory Visit (HOSPITAL_BASED_OUTPATIENT_CLINIC_OR_DEPARTMENT_OTHER)
Admission: RE | Admit: 2021-08-21 | Discharge: 2021-08-21 | Disposition: A | Discharge status: Home / Self Care | Payer: No Typology Code available for payment source | Source: Ambulatory Visit | Attending: General Surgery | Admitting: General Surgery

## 2021-08-21 ENCOUNTER — Other Ambulatory Visit: Payer: Self-pay

## 2021-08-21 ENCOUNTER — Ambulatory Visit: Payer: No Typology Code available for payment source | Admitting: Neurology

## 2021-08-21 ENCOUNTER — Ambulatory Visit (HOSPITAL_BASED_OUTPATIENT_CLINIC_OR_DEPARTMENT_OTHER)
Admission: RE | Admit: 2021-08-21 | Discharge: 2021-08-21 | Disposition: A | Discharge status: Home / Self Care | Payer: No Typology Code available for payment source | Source: Ambulatory Visit | Attending: Family Medicine | Admitting: Family Medicine

## 2021-08-21 DIAGNOSIS — D509 Iron deficiency anemia, unspecified: Secondary | ICD-10-CM

## 2021-08-21 DIAGNOSIS — E559 Vitamin D deficiency, unspecified: Secondary | ICD-10-CM | POA: Diagnosis present

## 2021-08-21 DIAGNOSIS — K449 Diaphragmatic hernia without obstruction or gangrene: Secondary | ICD-10-CM | POA: Diagnosis present

## 2021-08-21 DIAGNOSIS — E786 Lipoprotein deficiency: Secondary | ICD-10-CM

## 2021-08-21 DIAGNOSIS — K2100 Gastro-esophageal reflux disease with esophagitis, without bleeding: Secondary | ICD-10-CM | POA: Diagnosis present

## 2021-08-21 DIAGNOSIS — M25562 Pain in left knee: Secondary | ICD-10-CM | POA: Diagnosis present

## 2021-08-21 IMAGING — DX DG CHEST 2V
2 series · 2 of 2 positions shown · non-contrast
Comparison: [DATE]

CLINICAL DATA: Preop evaluation for upcoming esophageal surgery

EXAM:
CHEST - 2 VIEW

[chest pa]
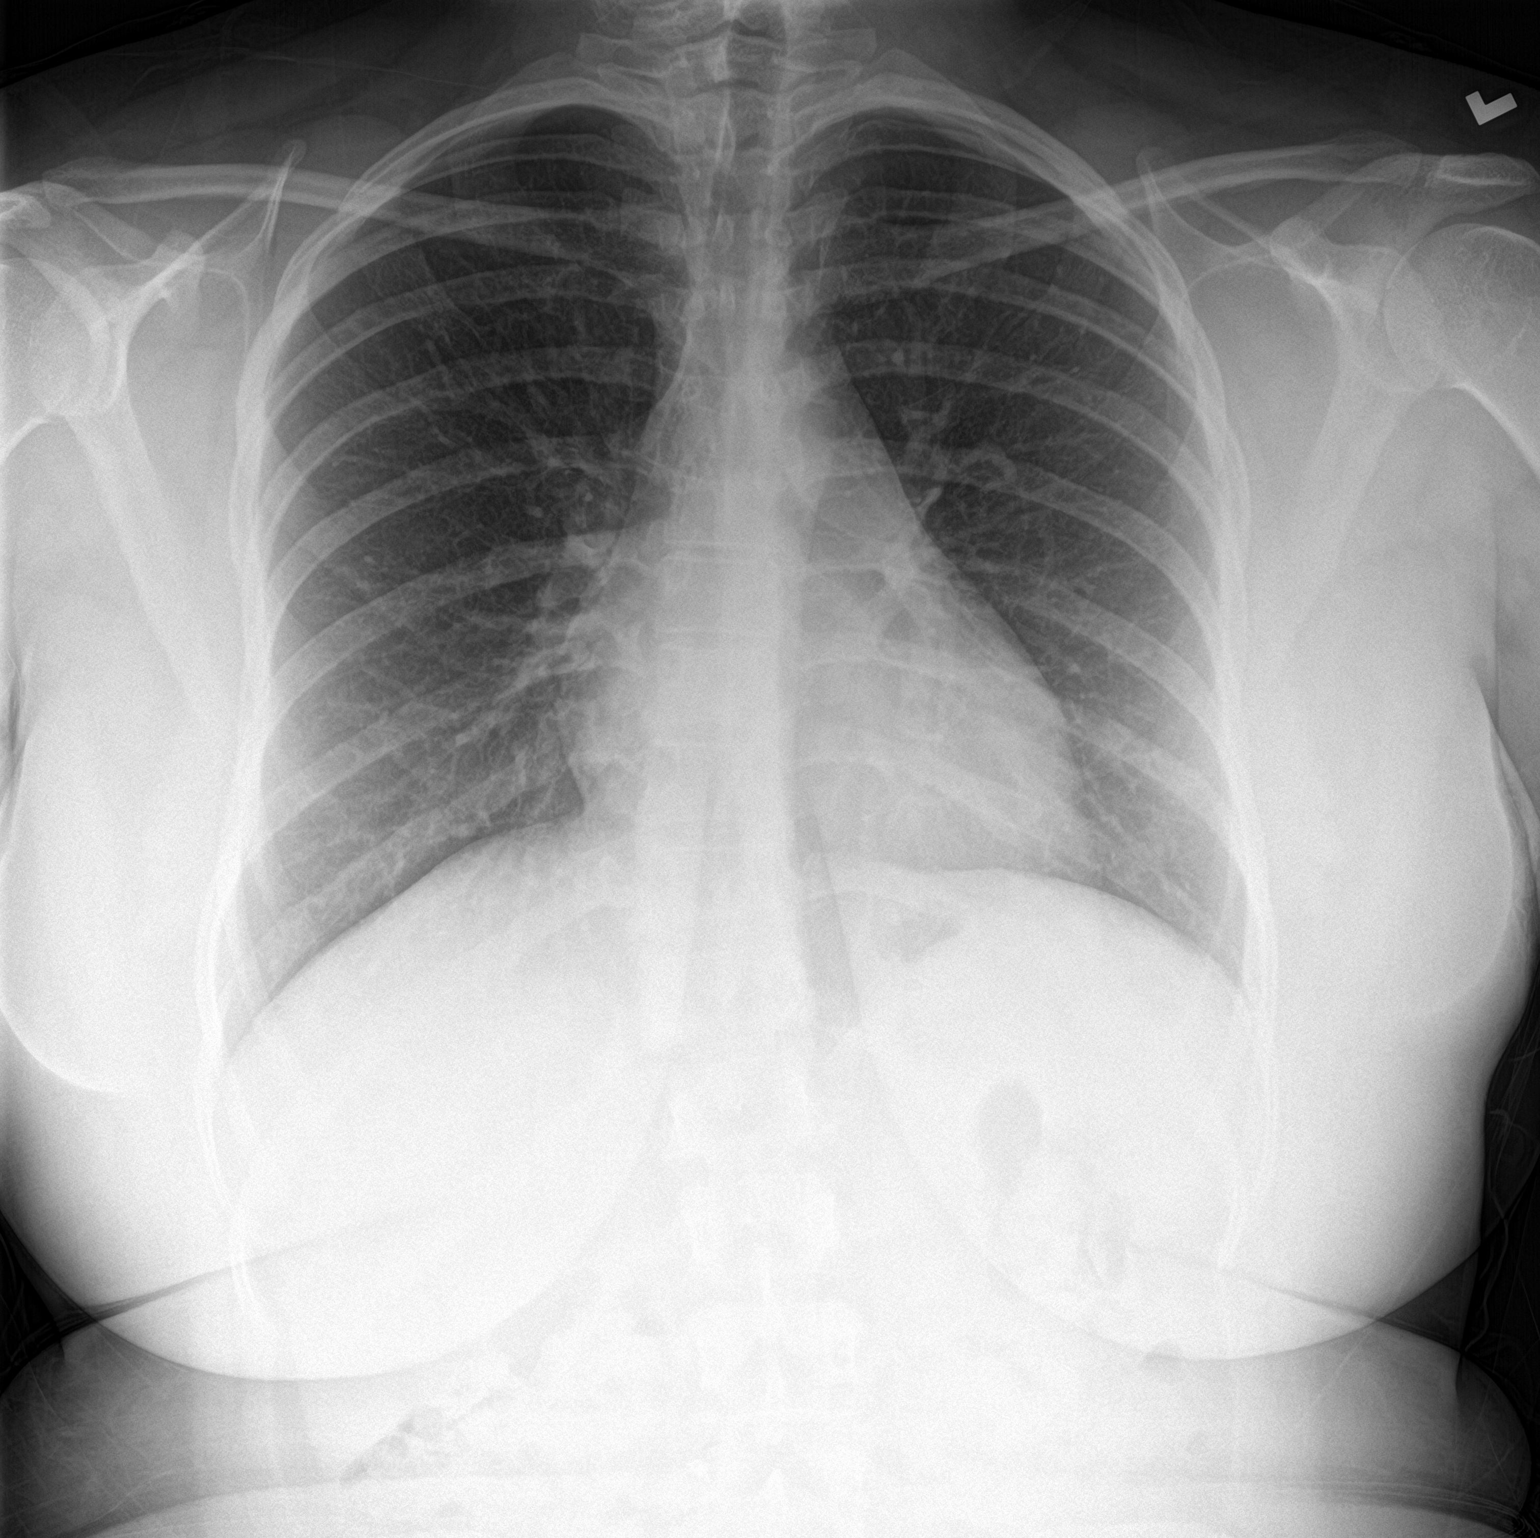

[chest lat]
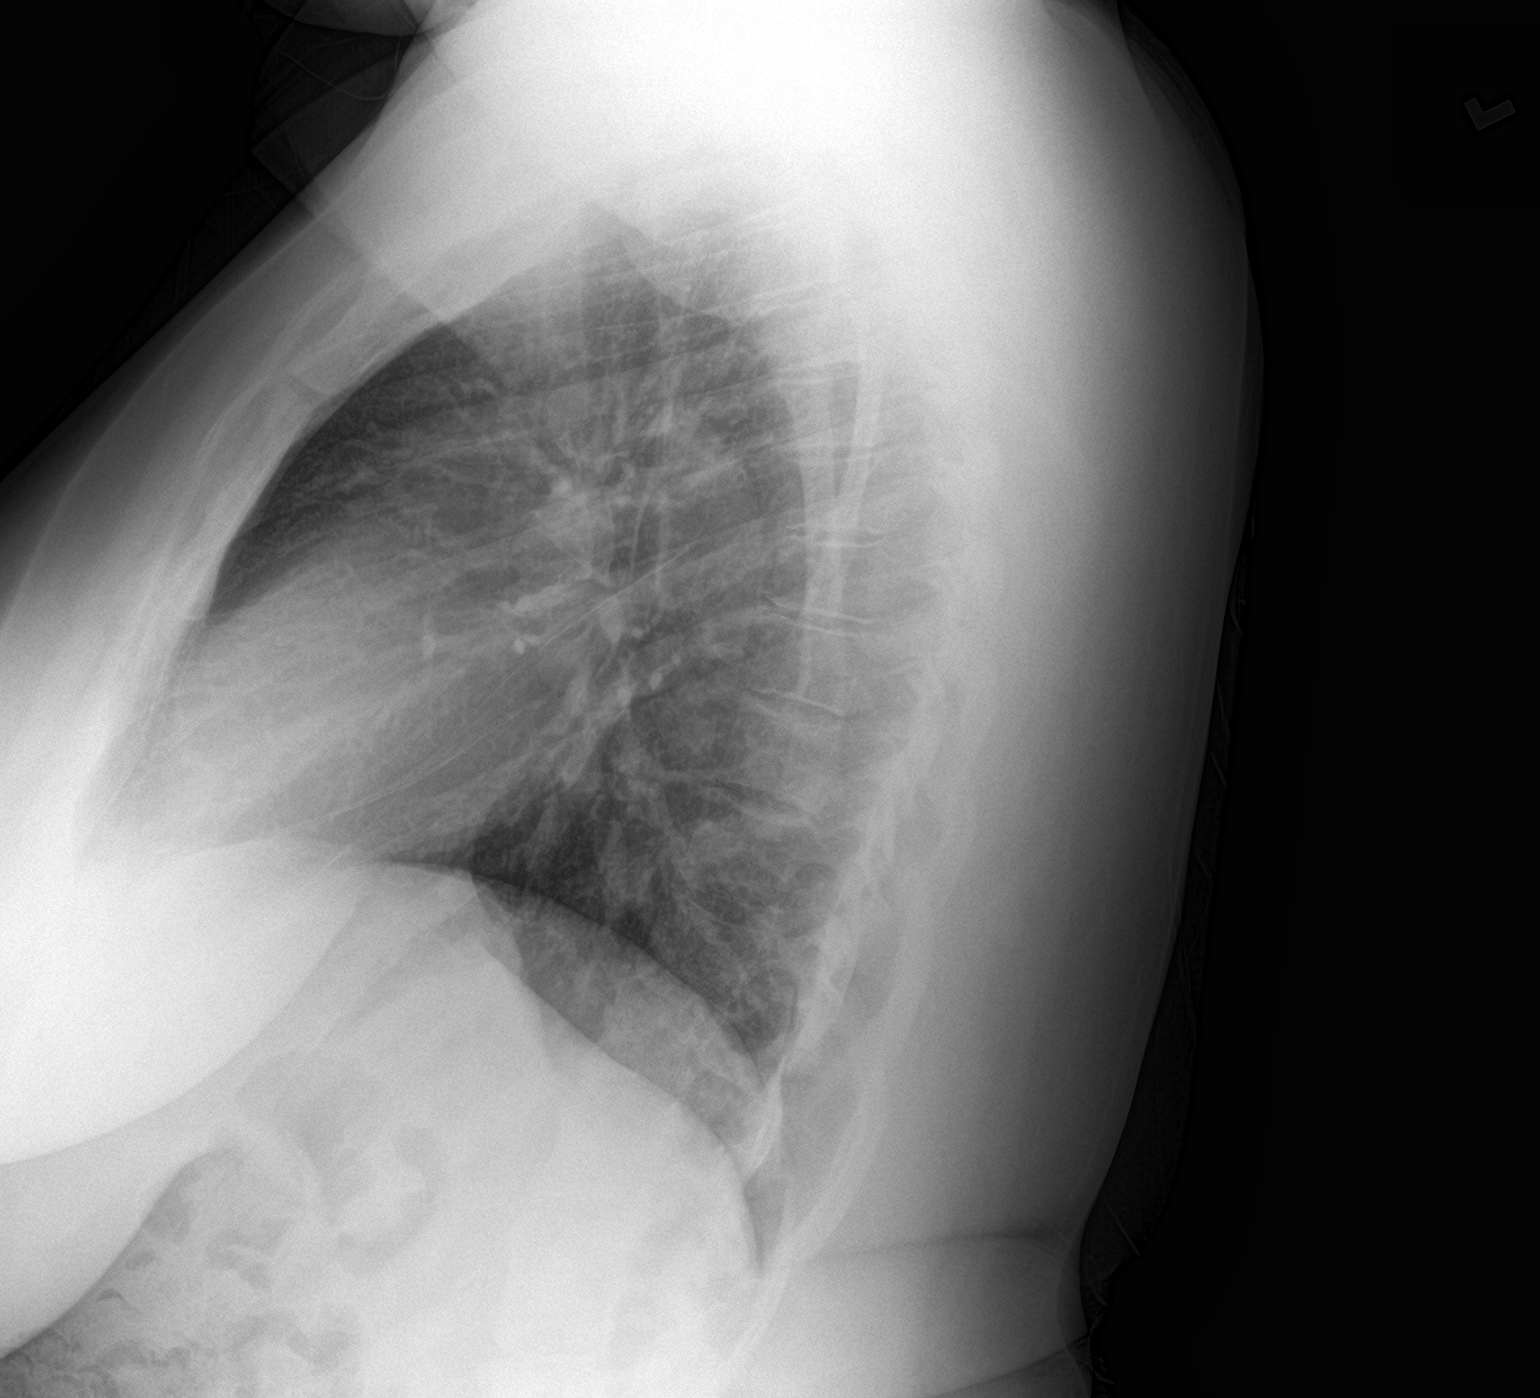

[2 of 2 positions shown; findings below may reference images not displayed]

FINDINGS: Cardiac shadow is within normal limits. Lungs are clear. No bony
abnormality is noted.
IMPRESSION: No acute abnormality noted.

## 2021-08-29 ENCOUNTER — Ambulatory Visit: Payer: Medicaid Other | Admitting: Neurology

## 2021-08-29 ENCOUNTER — Encounter: Payer: Self-pay | Admitting: Neurology

## 2021-08-29 VITALS — BP 121/77 | HR 86 | Ht 66.0 in | Wt 253.2 lb

## 2021-08-29 DIAGNOSIS — R42 Dizziness and giddiness: Secondary | ICD-10-CM

## 2021-08-29 DIAGNOSIS — G500 Trigeminal neuralgia: Secondary | ICD-10-CM | POA: Diagnosis not present

## 2021-08-29 MED ORDER — GABAPENTIN 100 MG PO CAPS: ORAL_CAPSULE | ORAL | 6 refills | Status: DC

## 2021-08-29 NOTE — Progress Notes (Signed)
? ?NEUROLOGY CONSULTATION NOTE ? ?Jodi Norman ?MRN: [id number] ?DOB: July 02, 1995 ? ?Referring provider: Dr. Roma Schanz ?Primary care provider: Dr. Roma Schanz ? ?Reason for consult:  dizziness, lip numbness ? ?Dear Dr Etter Sjogren: ? ?Thank you for your kind referral of Jodi Norman for consultation of the above symptoms. Although her history is well known to you, please allow me to reiterate it for the purpose of our medical record. She is alone in the office today. Records and images were personally reviewed where available. ? ? ?HISTORY OF PRESENT ILLNESS: ?This is a 26 year old right-handed woman with a history of non-specific bony lesions, GERD, anxiety, presenting for evaluation of right facial pain. She reports that she had an endoscopy in December and after the procedure there was pain and swelling on the right side of her face. The swelling subsided after 1-2 weeks, but the pain has continued more than 3 months later. EPIC notes were reviewed, she has been evaluated in her PCP office 3 times since the endoscopy with no mention of facial pain. Follow-up telephone call 3 days after the procedure indicated 0/10 pain score. She reported the numbness/pain on her most recent appointment on 08/08/21. She states that the pain is on her right upper lip and cheek. It is fine when she does not touch it, but when she touches her face or pulls up her lip, it is tender to touch, she feels discomfort with pins and needles, almost like an electrical shock. In February the whole right side of her face was tingling for a day, with burning under her skin when she touched her forehead or cheek. This lasted a little over 24 hours and has not recurred. The pain is worse when food hits the top of her lip or when she smiles. She takes Ibuprofen some days. Left side of face and her extremities are not affected. The dizziness is separate from the pain. She was in the ER on 07/04/21 after 3 dizzy spells at work. She was  sitting at her desk when she had a sudden sensation of room spinning for 2 minutes. She felt unsteady when she tried to stand. Her eyes got so blurry for 10-15 minutes, she was able to speak and comprehend. Exam was normal, BP 122/62, EKG NSR. She notes that when she is on her period, she feels faint "out of nowhere." She has headaches during her period and triggered by certain foods. She has been told she has migraines with associated nausea, photo/phonophobia. She denies any head injuries or recent cold symptoms. No family history of similar symptoms.  ? ?Laboratory Data: ?Lab Results  ?Component Value Date  ? ESRSEDRATE 100 (H) 03/06/2021  ? ? ? ?PAST MEDICAL HISTORY: ?Past Medical History:  ?Diagnosis Date  ? Generalized anxiety disorder 06/24/2020  ? GERD (gastroesophageal reflux disease)   ? ? ?PAST SURGICAL HISTORY: ?Past Surgical History:  ?Procedure Laterality Date  ? CESAREAN SECTION    ? MOUTH SURGERY  05/2021  ? UPPER GI ENDOSCOPY  04/27/2021  ? ? ?MEDICATIONS: ?Current Outpatient Medications on File Prior to Visit  ?Medication Sig Dispense Refill  ? ferrous sulfate 324 MG TBEC Take 324 mg by mouth.    ? ibuprofen (ADVIL) 200 MG tablet Take 400-800 mg by mouth every 6 (six) hours as needed for moderate pain, headache or cramping.    ? omeprazole (PRILOSEC) 40 MG capsule Take 1 capsule (40 mg total) by mouth 2 (two) times daily. 90 capsule 3  ?  Vitamin D, Ergocalciferol, (DRISDOL) 1.25 MG (50000 UNIT) CAPS capsule Take 1 capsule (50,000 Units total) by mouth every 7 (seven) days. 12 capsule 1  ? ?Current Facility-Administered Medications on File Prior to Visit  ?Medication Dose Route Frequency Provider Last Rate Last Admin  ? cyanocobalamin ((VITAMIN B-12)) injection 1,000 mcg  1,000 mcg Intramuscular Q30 days Carollee Herter, Kendrick Fries R, DO   1,000 mcg at 07/17/21 1444  ? ? ?ALLERGIES: ?No Known Allergies ? ?FAMILY HISTORY: ?Family History  ?Problem Relation Age of Onset  ? Hyperlipidemia Mother   ?  Hypertension Mother   ? Hyperlipidemia Father   ? Hypertension Father   ? Diabetes Father   ? Colon cancer Neg Hx   ? Stomach cancer Neg Hx   ? Esophageal cancer Neg Hx   ? Pancreatic cancer Neg Hx   ? Colon polyps Neg Hx   ? Rectal cancer Neg Hx   ? ? ?SOCIAL HISTORY: ?Social History  ? ?Socioeconomic History  ? Marital status: Single  ?  Spouse name: Not on file  ? Number of children: Not on file  ? Years of education: Not on file  ? Highest education level: Not on file  ?Occupational History  ? Occupation: human resources  ?  Employer: [employer]  ?Tobacco Use  ? Smoking status: Never  ? Smokeless tobacco: Never  ?Vaping Use  ? Vaping Use: Never used  ?Substance and Sexual Activity  ? Alcohol use: Never  ? Drug use: Never  ? Sexual activity: Not Currently  ?  Birth control/protection: None  ?Other Topics Concern  ? Not on file  ?Social History Narrative  ? Right handed  ? ?Social Determinants of Health  ? ?Financial Resource Strain: Not on file  ?Food Insecurity: Not on file  ?Transportation Needs: Not on file  ?Physical Activity: Not on file  ?Stress: Not on file  ?Social Connections: Not on file  ?Intimate Partner Violence: Not on file  ? ? ? ?PHYSICAL EXAM: ?Vitals:  ? 08/29/21 1250  ?BP: 121/77  ?Pulse: 86  ?SpO2: 96%  ? ?General: No acute distress ?Head:  Normocephalic/atraumatic ?Skin/Extremities: No rash, no edema ?Neurological Exam: ?Mental status: alert and oriented to person, place, and time, no dysarthria or aphasia, Fund of knowledge is appropriate.  Recent and remote memory are intact.  Attention and concentration are normal.     ?Cranial nerves: ?CN I: not tested ?CN II: pupils equal, round and reactive to light, visual fields intact ?CN III, IV, VI:  full range of motion, no nystagmus, no ptosis ?CN V: facial sensation decreased to pin and cold on right V2 ?CN VII: upper and lower face symmetric ?CN VIII: hearing intact to conversation ?CN XI: sternocleidomastoid and trapezius muscles  intact ?CN XII: tongue midline ?Bulk & Tone: normal, no fasciculations. ?Motor: 5/5 throughout with no pronator drift. ?Sensation: intact to light touch, cold, pin, vibration sense.  No extinction to double simultaneous stimulation.  Romberg test negative ?Deep Tendon Reflexes: +2 throughout ?Cerebellar: no incoordination on finger to nose testing ?Gait: narrow-based and steady, able to tandem walk adequately. ?Tremor: none ? ? ?IMPRESSION: ?This is a 26 year old right-handed woman with a history of non-specific bony lesions, GERD, anxiety, presenting for evaluation of right facial pain suggestive of trigeminal neuralgia. I discussed with her that symptoms are unlikely due to her endoscopy. She has seen her oral surgeon who also ruled out dental cause. MRI brain/face with and without contrast will be ordered to assess for  underlying structural abnormality. We discussed symptomatic treatment with low dose gabapentin '100mg'$ , we may uptitrate as tolerated. Dizziness that occurred in 06/2021 suggestive of vertigo, it has not recurred, consider vestibular therapy for recurrence. Follow-up in 3 months, call for any changes.  ? ?Thank you for allowing me to participate in the care of this patient. Please do not hesitate to call for any questions or concerns. ? ? ?Ellouise Newer, M.D. ? ?CC: Dr. Cheri Rous ? ? ?

## 2021-08-29 NOTE — Patient Instructions (Signed)
Schedule MRI brain with and without contrast ? ?2. Start Gabapentin '100mg'$  at night and see how you feel ? ?3. Follow-up in 3 months, call for any changes ?

## 2021-08-30 ENCOUNTER — Ambulatory Visit (INDEPENDENT_AMBULATORY_CARE_PROVIDER_SITE_OTHER): Payer: No Typology Code available for payment source | Admitting: Family Medicine

## 2021-09-11 ENCOUNTER — Ambulatory Visit (INDEPENDENT_AMBULATORY_CARE_PROVIDER_SITE_OTHER): Payer: No Typology Code available for payment source | Admitting: Licensed Clinical Social Worker

## 2021-09-11 DIAGNOSIS — F419 Anxiety disorder, unspecified: Secondary | ICD-10-CM | POA: Diagnosis not present

## 2021-09-12 NOTE — Progress Notes (Signed)
Comprehensive Clinical Assessment (CCA) Note ? ?09/12/2021 ?Jodi Norman ?[id number] ? ?Chief Complaint:  ?Chief Complaint  ?Patient presents with  ? Obesity  ? ?Visit Diagnosis: Anxiety disorder, unspecified type   ? ? ?CCA Biopsychosocial ?Intake/Chief Complaint:  Bariatric ? ?Current Symptoms/Problems: Anxiety: health anxieties, feels panic from out of no where at times, worries about health, joint pain, had a cancer scare: had lesions on her bones but things come back clear ? ? ?Patient Reported Schizophrenia/Schizoaffective Diagnosis in Past: No ? ? ?Strengths: Funny, great mother, good listener, family oriented ? ?Preferences: prefers to be surrounded by love/family, prefers peace, doesn't prefer "drama," doesn't prefer negativity ? ?Abilities: fast learner, played sports in the past ? ? ?Type of Services Patient Feels are Needed: Bariatric ? ? ?Initial Clinical Notes/Concerns: Symptoms of anxiety started when she had a cancer scare, and thoughts of not being there for child, symptoms occur several days a week, symptoms are moderate. Lyposuction 2020, C-section-2019, recovered well from those procedures ? ? ?Mental Health Symptoms ?Depression:   ?None ?  ?Duration of Depressive symptoms: No data recorded  ?Mania:   ?None ?  ?Anxiety:    ?Worrying; Tension; Fatigue ?  ?Psychosis:   ?None ?  ?Duration of Psychotic symptoms: No data recorded  ?Trauma:   ?None ?  ?Obsessions:   ?None ?  ?Compulsions:   ?None ?  ?Inattention:   ?None ?  ?Hyperactivity/Impulsivity:   ?None ?  ?Oppositional/Defiant Behaviors:   ?None ?  ?Emotional Irregularity:   ?None ?  ?Other Mood/Personality Symptoms:   ?N/A ?  ? ?Mental Status Exam ?Appearance and self-care  ?Stature:   ?Average ?  ?Weight:   ?Obese ?  ?Clothing:   ?Casual ?  ?Grooming:   ?Well-groomed ?  ?Cosmetic use:   ?Age appropriate ?  ?Posture/gait:   ?Normal ?  ?Motor activity:   ?Not Remarkable ?  ?Sensorium  ?Attention:   ?Normal ?  ?Concentration:   ?Normal ?   ?Orientation:   ?X5 ?  ?Recall/memory:   ?Normal ?  ?Affect and Mood  ?Affect:   ?Appropriate ?  ?Mood:   ?Euthymic ?  ?Relating  ?Eye contact:   ?Normal ?  ?Facial expression:   ?Responsive ?  ?Attitude toward examiner:   ?Cooperative ?  ?Thought and Language  ?Speech flow:  ?Clear and Coherent ?  ?Thought content:   ?Appropriate to Mood and Circumstances ?  ?Preoccupation:   ?None ?  ?Hallucinations:   ?None ?  ?Organization:  No data recorded  ?Executive Functions  ?Fund of Knowledge:   ?Average ?  ?Intelligence:   ?Average ?  ?Abstraction:   ?Normal ?  ?Judgement:   ?Fair ?  ?Reality Testing:   ?Adequate ?  ?Insight:   ?Fair ?  ?Decision Making:   ?Normal ?  ?Social Functioning  ?Social Maturity:   ?Responsible ?  ?Social Judgement:   ?Normal ?  ?Stress  ?Stressors:   ?Illness ?  ?Coping Ability:   ?Deficient supports ?  ?Skill Deficits:   ?None ?  ?Supports:   ?Family ?  ? ? ?Religion: ?Religion/Spirituality ?Are You A Religious Person?: Yes ?What is Your Religious Affiliation?: Unknown ?How Might This Affect Treatment?: No impact ? ?Leisure/Recreation: ?Leisure / Recreation ?Do You Have Hobbies?: Yes ?Leisure and Hobbies: dance, music, spend time with family and friends, spend time with daughter ? ?Exercise/Diet: ?Exercise/Diet ?Do You Exercise?: Yes ?What Type of Exercise Do You Do?: Run/Walk ?How Many Times a Week Do You Exercise?: 1-3 times  a week ?Have You Gained or Lost A Significant Amount of Weight in the Past Six Months?: No ?Do You Follow a Special Diet?: Yes ?Type of Diet: Keto diet, portion control ?Do You Have Any Trouble Sleeping?: Yes ?Explanation of Sleeping Difficulties: 6 hours, difficulty falling asleep due to phone ? ? ?CCA Employment/Education ?Employment/Work Situation: ?Employment / Work Situation ?Employment Situation: Employed ?Where is Patient Currently Employed?: [employer] ?How Long has Patient Been Employed?: 3 years ?Are You Satisfied With Your Job?: Yes ?Do You Work More Than One Job?:  No ?Work Stressors: None ?Patient's Job has Been Impacted by Current Illness: No ?What is the Longest Time Patient has Held a Job?: 3 years ?Where was the Patient Employed at that Time?: [employer] ?Has Patient ever Been in the Military?: No ? ?Education: ?Education ?Is Patient Currently Attending School?: Yes ?School Currently Attending: [employer]-starting back in [place] ?Last Grade Completed: 12 ?Name of High School: [employer] ?Did You Graduate From [employer]?: Yes ?Did You Attend College?:  ([employer] certificate) ?Did You Attend Graduate School?: No ?Did You Have Any Special Interests In School?: Gym, Math ?Did You Have An Individualized Education Program (IIEP): No ?Did You Have Any Difficulty At School?: No ?Patient's Education Has Been Impacted by Current Illness: No ? ? ?CCA Family/Childhood History ?Family and Relationship History: ?Family history ?Marital status: Single ?Are you sexually active?: No ?What is your sexual orientation?: Heterosexual ?Has your sexual activity been affected by drugs, alcohol, medication, or emotional stress?: None ?Does patient have children?: Yes ?How many children?: 1 ?How is patient's relationship with their children?: Daughter: great ? ?Childhood History:  ?Childhood History ?By whom was/is the patient raised?: Both parents ?Additional childhood history information: Both parents in the home. Patient describes childhood as "fun.Marland KitchenMarland KitchenI want to back." ?Description of patient's relationship with caregiver when they were a child: Mother: ok, Father: close ?Patient's description of current relationship with people who raised him/her: Mother: close, Father: close ?How were you disciplined when you got in trouble as a child/adolescent?: Time outs, things taken away, grounded ?Does patient have siblings?: Yes ?Number of Siblings: 2 ?Description of patient's current relationship with siblings: Brother, Sister: close to sister, ok with brother ?Did patient suffer any  verbal/emotional/physical/sexual abuse as a child?: No ?Has patient ever been sexually abused/assaulted/raped as an adolescent or adult?: No ?Was the patient ever a victim of a crime or a disaster?: No ?Witnessed domestic violence?: No ?Has patient been affected by domestic violence as an adult?: No (Relationship with daughter's father was unhealthy but not abusive) ? ?Child/Adolescent Assessment: ?  ? ? ?CCA Substance Use ?Alcohol/Drug Use: ?Alcohol / Drug Use ?Pain Medications: See patient MAR ?Prescriptions: See patient MAR ?Over the Counter: See patient MAR ?History of alcohol / drug use?: No history of alcohol / drug abuse ?  ?  ?  ?  ?   ?  ?  ?  ?  ?  ? ? ? ?ASAM's:  Six Dimensions of Multidimensional Assessment ? ?Dimension 1:  Acute Intoxication and/or Withdrawal Potential:   ?Dimension 1:  Description of individual's past and current experiences of substance use and withdrawal: None  ?Dimension 2:  Biomedical Conditions and Complications:   ?Dimension 2:  Description of patient's biomedical conditions and  complications: None  ?Dimension 3:  Emotional, Behavioral, or Cognitive Conditions and Complications:  Dimension 3:  Description of emotional, behavioral, or cognitive conditions and complications: None  ?Dimension 4:  Readiness to Change:  Dimension 4:  Description of Readiness to  Change criteria: None  ?Dimension 5:  Relapse, Continued use, or Continued Problem Potential:  Dimension 5:  Relapse, continued use, or continued problem potential critiera description: None  ?Dimension 6:  Recovery/Living Environment:  Dimension 6:  Recovery/Iiving environment criteria description: None  ?ASAM Severity Score: ASAM's Severity Rating Score: 0  ?ASAM Recommended Level of Treatment:    ? ?Substance use Disorder (SUD) ?  ? ?Recommendations for Services/Supports/Treatments: ?Recommendations for Services/Supports/Treatments ?Recommendations For Services/Supports/Treatments: Other (Comment) (Bariatric  Surgery) ? ?DSM5 Diagnoses: ?Patient Active Problem List  ? Diagnosis Date Noted  ? Iron deficiency anemia 08/17/2021  ? GERD (gastroesophageal reflux disease)   ? Morbid obesity ([place]) 06/05/2021  ? History of scoliosis 03/06/2021  ? Chronic

## 2021-09-13 ENCOUNTER — Ambulatory Visit: Payer: No Typology Code available for payment source | Admitting: Neurology

## 2021-09-15 ENCOUNTER — Other Ambulatory Visit: Payer: Self-pay | Admitting: Medical

## 2021-09-16 ENCOUNTER — Emergency Department (HOSPITAL_BASED_OUTPATIENT_CLINIC_OR_DEPARTMENT_OTHER)
Admission: EM | Admit: 2021-09-16 | Discharge: 2021-09-16 | Disposition: A | Discharge status: Home / Self Care | Payer: No Typology Code available for payment source | Attending: Emergency Medicine | Admitting: Emergency Medicine

## 2021-09-16 ENCOUNTER — Encounter (HOSPITAL_BASED_OUTPATIENT_CLINIC_OR_DEPARTMENT_OTHER): Payer: Self-pay | Admitting: Emergency Medicine

## 2021-09-16 ENCOUNTER — Other Ambulatory Visit: Payer: Self-pay

## 2021-09-16 ENCOUNTER — Emergency Department (HOSPITAL_BASED_OUTPATIENT_CLINIC_OR_DEPARTMENT_OTHER): Payer: No Typology Code available for payment source

## 2021-09-16 DIAGNOSIS — Z20822 Contact with and (suspected) exposure to covid-19: Secondary | ICD-10-CM | POA: Diagnosis not present

## 2021-09-16 DIAGNOSIS — J069 Acute upper respiratory infection, unspecified: Secondary | ICD-10-CM | POA: Insufficient documentation

## 2021-09-16 DIAGNOSIS — R059 Cough, unspecified: Secondary | ICD-10-CM | POA: Diagnosis present

## 2021-09-16 LAB — RESP PANEL BY RT-PCR (FLU A&B, COVID) ARPGX2
Influenza A by PCR: NEGATIVE
Influenza B by PCR: NEGATIVE
SARS Coronavirus 2 by RT PCR: NEGATIVE

## 2021-09-16 LAB — GROUP A STREP BY PCR: Group A Strep by PCR: NOT DETECTED

## 2021-09-16 IMAGING — DX DG CHEST 2V
2 series · 2 of 2 positions shown · non-contrast
Comparison: [DATE]

CLINICAL DATA: Cough

EXAM:
CHEST - 2 VIEW

[chest lat]
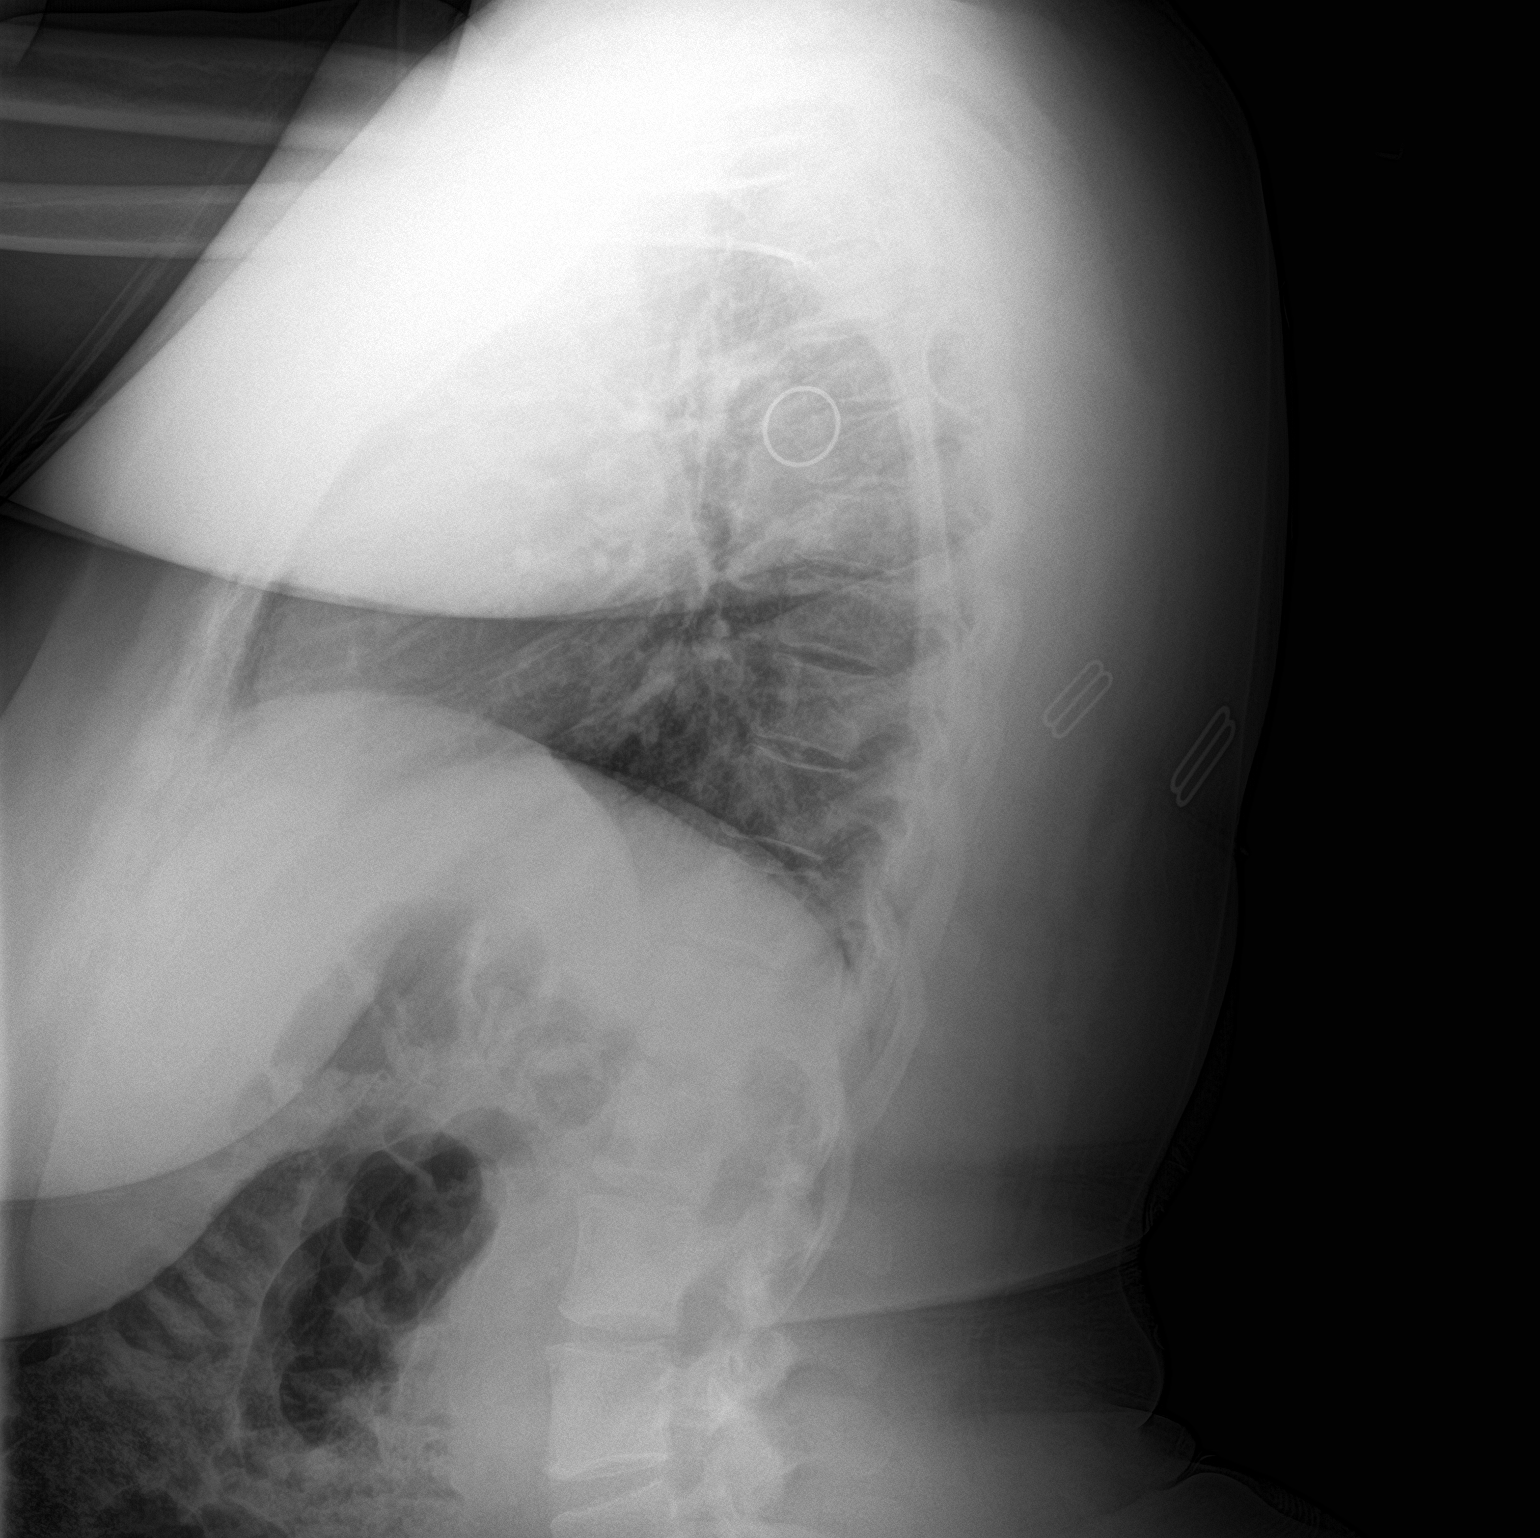

[chest pa]
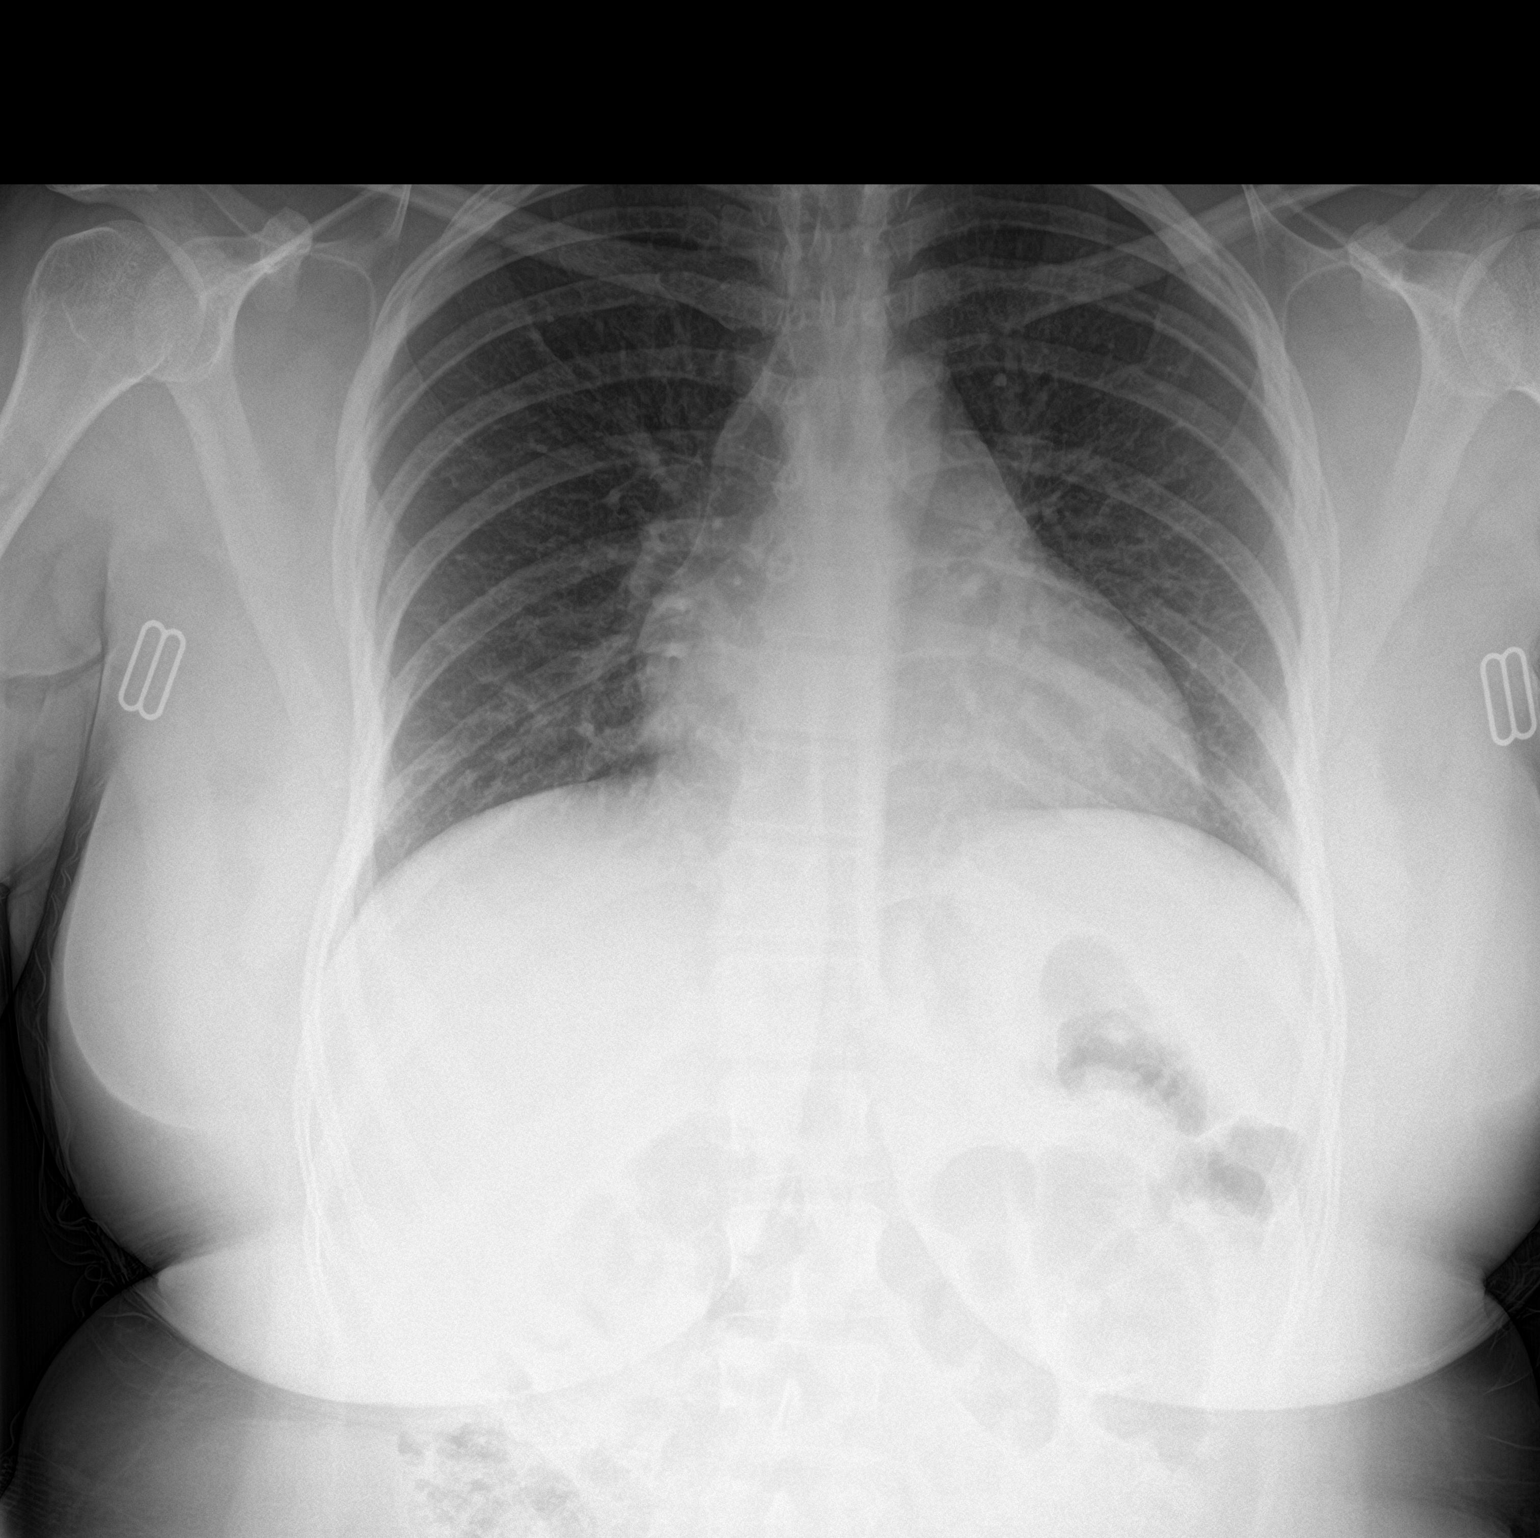

[2 of 2 positions shown; findings below may reference images not displayed]

FINDINGS: The heart size and mediastinal contours are within normal limits. No
focal airspace consolidation, pleural effusion, or pneumothorax.
IMPRESSION: No active cardiopulmonary disease.

## 2021-09-16 MED ORDER — BENZONATATE 100 MG PO CAPS: 100.0000 mg | ORAL_CAPSULE | Freq: Three times a day (TID) | ORAL | 0 refills | Status: DC

## 2021-09-16 MED ORDER — ONDANSETRON 4 MG PO TBDP
4.0000 mg | ORAL_TABLET | Freq: Once | ORAL | Status: AC
Start: 2021-09-16 — End: 2021-09-16
  Administered 2021-09-16: 4 mg via ORAL
  Filled 2021-09-16: qty 1

## 2021-09-16 MED ORDER — ACETAMINOPHEN 500 MG PO TABS
1000.0000 mg | ORAL_TABLET | Freq: Once | ORAL | Status: AC
  Administered 2021-09-16: 1000 mg via ORAL
  Filled 2021-09-16: qty 2

## 2021-09-16 MED ORDER — DEXAMETHASONE SODIUM PHOSPHATE 10 MG/ML IJ SOLN
10.0000 mg | Freq: Once | INTRAMUSCULAR | Status: AC
Start: 2021-09-16 — End: 2021-09-16
  Administered 2021-09-16: 10 mg via INTRAMUSCULAR
  Filled 2021-09-16: qty 1

## 2021-09-16 MED ORDER — ONDANSETRON 8 MG PO TBDP: 8.0000 mg | ORAL_TABLET | Freq: Three times a day (TID) | ORAL | 0 refills | Status: DC | PRN

## 2021-09-16 NOTE — ED Provider Notes (Signed)
?[facility] ?Provider Note ? ? ?CSN: [id number] ?Arrival date & time: 09/16/21  1359 ? ?  ? ?History ? ?Chief Complaint  ?Patient presents with  ? Cough  ? ? ?Jodi Norman is a 26 y.o. female. ? ? ?Cough ? ?Patient with medical history notable for GERD presents today due to multiple viral symptoms x3 days.  She has been having headache, cough, sore throat, fevers, chills for the last 3 days, also having associated nausea with one episode of emesis and some episodes of diarrhea.  Her daughter is at home with similar symptoms, states she has been trying some penicillin she had leftover from a dental procedure with no improvement of her symptoms. ? ?Home Medications ?Prior to Admission medications   ?Medication Sig Start Date End Date Taking? Authorizing Provider  ?benzonatate (TESSALON) 100 MG capsule Take 1 capsule (100 mg total) by mouth every 8 (eight) hours. 09/16/21  Yes Sherrill Raring, PA-C  ?ondansetron (ZOFRAN-ODT) 8 MG disintegrating tablet Take 1 tablet (8 mg total) by mouth every 8 (eight) hours as needed for nausea or vomiting. 09/16/21  Yes Sherrill Raring, PA-C  ?ferrous sulfate 324 MG TBEC Take 324 mg by mouth.    [provider]  ?ferrous sulfate 325 (65 FE) MG tablet TAKE 325 MG BY MOUTH DAILY. 09/15/21   Saguier, Percell Miller, PA-C  ?gabapentin (NEURONTIN) 100 MG capsule Take 1 capsule every night 08/29/21   Cameron Sprang, MD  ?ibuprofen (ADVIL) 200 MG tablet Take 400-800 mg by mouth every 6 (six) hours as needed for moderate pain, headache or cramping.    [provider]  ?omeprazole (PRILOSEC) 40 MG capsule Take 1 capsule (40 mg total) by mouth 2 (two) times daily. 05/19/21   Milus Banister, MD  ?Vitamin D, Ergocalciferol, (DRISDOL) 1.25 MG (50000 UNIT) CAPS capsule Take 1 capsule (50,000 Units total) by mouth every 7 (seven) days. 07/27/21   Ann Held, DO  ?   ? ?Allergies    ?Patient has no known allergies.   ? ?Review of Systems   ?Review of Systems   ?Respiratory:  Positive for cough.   ? ?Physical Exam ?Updated Vital Signs ?BP 106/63 (BP Location: Right Arm)   Pulse (!) 105   Temp 98.3 ?F (36.8 ?C) (Oral)   Resp 18   Ht '5\' 6"'$  (1.676 m)   Wt 113.4 kg   LMP 08/26/2021   SpO2 100%   BMI 40.35 kg/m?  ?Physical Exam ?Vitals and nursing note reviewed. Exam conducted with a chaperone present.  ?Constitutional:   ?   Appearance: Normal appearance.  ?HENT:  ?   Head: Normocephalic and atraumatic.  ?   Mouth/Throat:  ?   Pharynx: Posterior oropharyngeal erythema present.  ?   Comments: Uvula is midline, no sublingual tenderness ?Eyes:  ?   General: No scleral icterus.    ?   Right eye: No discharge.     ?   Left eye: No discharge.  ?   Extraocular Movements: Extraocular movements intact.  ?   Pupils: Pupils are equal, round, and reactive to light.  ?Cardiovascular:  ?   Rate and Rhythm: Normal rate and regular rhythm.  ?   Pulses: Normal pulses.  ?   Heart sounds: Normal heart sounds. No murmur heard. ?  No friction rub. No gallop.  ?Pulmonary:  ?   Effort: Pulmonary effort is normal. No respiratory distress.  ?   Comments: Diminished lung sounds bilaterally ?Abdominal:  ?  General: Abdomen is flat. Bowel sounds are normal. There is no distension.  ?   Palpations: Abdomen is soft.  ?   Tenderness: There is no abdominal tenderness.  ?Skin: ?   General: Skin is warm and dry.  ?   Coloration: Skin is not jaundiced.  ?Neurological:  ?   Mental Status: She is alert. Mental status is at baseline.  ?   Coordination: Coordination normal.  ? ? ?ED Results / Procedures / Treatments   ?Labs ?(all labs ordered are listed, but only abnormal results are displayed) ?Labs Reviewed  ?RESP PANEL BY RT-PCR (FLU A&B, COVID) [id number]  ?GROUP A STREP BY PCR  ? ? ?EKG ?None ? ?Radiology ?DG Chest 2 View ? ?Result Date: 09/16/2021 ?CLINICAL DATA:  Cough EXAM: CHEST - 2 VIEW COMPARISON:  08/21/2021 FINDINGS: The heart size and mediastinal contours are within normal limits. No focal  airspace consolidation, pleural effusion, or pneumothorax. IMPRESSION: No active cardiopulmonary disease. Electronically Signed   By: Davina Poke D.O.   On: 09/16/2021 15:22   ? ?Procedures ?Procedures  ? ? ?Medications Ordered in ED ?Medications  ?dexamethasone (DECADRON) injection 10 mg (has no administration in time range)  ?ondansetron (ZOFRAN-ODT) disintegrating tablet 4 mg (4 mg Oral Given 09/16/21 1433)  ?acetaminophen (TYLENOL) tablet 1,000 mg (1,000 mg Oral Given 09/16/21 1457)  ? ? ?ED Course/ Medical Decision Making/ A&P ?  ?                        ?Medical Decision Making ?Amount and/or Complexity of Data Reviewed ?Radiology: ordered. ? ?Risk ?OTC drugs. ?Prescription drug management. ? ? ?Patient presents with viral symptoms.  Differential diagnosis includes but is not limited to strep, viral process, pneumonia, bronchitis.   ? ?On intake she is febrile at 100.8 with a mild tachycardia of 106.  I suspect this is likely secondary to the fever, Tylenol was ordered by myself. ? ?On exam she does have diminished breath sounds bilaterally which could be secondary to effort.  There is no tachypnea, hypoxia or signs of respiratory distress.  However, I did order, review and interpret the x-ray which did not show any acute process such as PNA. ? ?Strep test, viral panel were ordered which are negative for strep, COVID, flu.  ? ?Suspect viral process.  I ordered her a shot of Decadron to help with the sore throat.  Discharged on Zofran and Tessalon Perles as needed for cough.  Discharged in stable condition. ? ? ? ? ? ? ? ?Final Clinical Impression(s) / ED Diagnoses ?Final diagnoses:  ?Viral URI with cough  ? ? ?Rx / DC Orders ?ED Discharge Orders   ? ?      Ordered  ?  benzonatate (TESSALON) 100 MG capsule  Every 8 hours       ? 09/16/21 1537  ?  ondansetron (ZOFRAN-ODT) 8 MG disintegrating tablet  Every 8 hours PRN       ? 09/16/21 1537  ? ?  ?  ? ?  ? ? ?  ?Sherrill Raring, PA-C ?09/16/21 1542 ? ?   ?Gareth Morgan, MD ?09/18/21 0003 ? ?

## 2021-09-16 NOTE — ED Notes (Signed)
Pt ambulatory to waiting room. Pt verbalized understanding of discharge instructions.   

## 2021-09-16 NOTE — Discharge Instructions (Addendum)
Chest x-ray is clear does not show any signs of pneumonia, additionally you are negative for COVID, flu and strep.   Take the cough medicine every 8 hours as needed, he can also take the nausea medicine every 8 hours as needed dissolves under your tongue.  Continue taking Tylenol Motrin for the fever, I suspect this is a viral process and will pass on its own in the next 2 days.  Work note provided below. ?

## 2021-09-16 NOTE — ED Triage Notes (Signed)
Pt arrives pov, to triage in wheelchair, c/o HA, cough, sore throat chills, n/v/d x 3 days. Endorses 650 mg tylenol and 600 mg ibuprofen x 3 hrs pta.Daughter was sick with similar symptoms ?

## 2021-09-17 ENCOUNTER — Other Ambulatory Visit: Payer: Self-pay | Admitting: Family Medicine

## 2021-09-17 DIAGNOSIS — G4700 Insomnia, unspecified: Secondary | ICD-10-CM

## 2021-09-17 DIAGNOSIS — F419 Anxiety disorder, unspecified: Secondary | ICD-10-CM

## 2021-09-19 ENCOUNTER — Other Ambulatory Visit: Payer: Self-pay

## 2021-09-19 ENCOUNTER — Encounter: Payer: Self-pay | Admitting: Family Medicine

## 2021-09-19 MED ORDER — IBUPROFEN 800 MG PO TABS: 800.0000 mg | ORAL_TABLET | Freq: Four times a day (QID) | ORAL | 0 refills | Status: DC | PRN

## 2021-09-20 ENCOUNTER — Ambulatory Visit (INDEPENDENT_AMBULATORY_CARE_PROVIDER_SITE_OTHER): Payer: No Typology Code available for payment source | Admitting: Medical

## 2021-09-20 ENCOUNTER — Encounter: Payer: Self-pay | Admitting: Medical

## 2021-09-20 VITALS — BP 110/70 | HR 95 | Temp 98.6°F | Resp 18 | Ht 66.0 in | Wt 253.0 lb

## 2021-09-20 DIAGNOSIS — J40 Bronchitis, not specified as acute or chronic: Secondary | ICD-10-CM

## 2021-09-20 MED ORDER — AZITHROMYCIN 250 MG PO TABS: ORAL_TABLET | ORAL | 0 refills | Status: AC

## 2021-09-20 MED ORDER — HYDROCODONE BIT-HOMATROP MBR 5-1.5 MG/5ML PO SOLN: 5.0000 mL | Freq: Three times a day (TID) | ORAL | 0 refills | Status: DC | PRN

## 2021-09-20 MED ORDER — PREDNISONE 10 MG (21) PO TBPK: ORAL_TABLET | ORAL | 0 refills | Status: DC

## 2021-09-20 NOTE — Patient Instructions (Addendum)
Severe cough during the exam.Probable bronchitis(symptoms persist despite ED tx).  Negative work up in ED. Particularly no covid and cxr did not show pneumonia at that time. However symptoms persist.  ? ?Will rx tapered prednisone for 6 days, hycodan cough med and azithromycin antibiotic. ? ?If symptoms persist despite the above then recommend getting chest xray again. ? ?Follow up 7 days or sooner if needed. ?

## 2021-09-20 NOTE — Progress Notes (Signed)
? ?Subjective:  ? ? Patient ID: Jodi Norman, female    DOB: February 23, 1996, 26 y.o.   MRN: [id number] ? ?HPI ?Pt has been sick since last Wednesday.  ? ?Pt seen in ED on 09-16-2021. ? ?Cough ?  ?Patient with medical history notable for GERD presents today due to multiple viral symptoms x3 days.  She has been having headache, cough, sore throat, fevers, chills for the last 3 days, also having associated nausea with one episode of emesis and some episodes of diarrhea.  Her daughter is at home with similar symptoms, states she has been trying some penicillin she had leftover from a dental procedure with no improvement of her symptoms. ? ?A/P ? ?ED Course/ Medical Decision Making/ A&P ?                        ?Medical Decision Making ?Amount and/or Complexity of Data Reviewed ?Radiology: ordered. ?  ?Risk ?OTC drugs. ?Prescription drug management. ?  ?  ?Patient presents with viral symptoms.  Differential diagnosis includes but is not limited to strep, viral process, pneumonia, bronchitis.   ?  ?On intake she is febrile at 100.8 with a mild tachycardia of 106.  I suspect this is likely secondary to the fever, Tylenol was ordered by myself. ? ?On exam she does have diminished breath sounds bilaterally which could be secondary to effort.  There is no tachypnea, hypoxia or signs of respiratory distress.  However, I did order, review and interpret the x-ray which did not show any acute process such as PNA. ? ?Strep test, viral panel were ordered which are negative for strep, COVID, flu.  ?  ?Suspect viral process.  I ordered her a shot of Decadron to help with the sore throat.  Discharged on Zofran and Tessalon  ? ? ?Pt states chills stopped on Sunday.  ? ?Pt denies any sneezing, itching eyes or runny.  ? ?Pt states no wheezing. No prior use of inhalers. No asthma as child. ? ?She is coughing constantly at night. ? ? ?Pt feels like she may have mucus in chest. ? ? ?Review of Systems  ?Constitutional:  Negative for chills,  fatigue and fever.  ?HENT:  Negative for congestion, drooling and ear pain.   ?Respiratory:  Negative for cough, chest tightness, shortness of breath and wheezing.   ?Cardiovascular:  Negative for chest pain and palpitations.  ?Gastrointestinal:  Negative for abdominal pain.  ?Musculoskeletal:  Negative for back pain and neck pain.  ?Psychiatric/Behavioral:  Negative for behavioral problems, confusion and suicidal ideas. The patient is not nervous/anxious.   ? ? ?Past Medical History:  ?Diagnosis Date  ? Generalized anxiety disorder 06/24/2020  ? GERD (gastroesophageal reflux disease)   ? ?  ?Social History  ? ?Socioeconomic History  ? Marital status: Single  ?  Spouse name: Not on file  ? Number of children: Not on file  ? Years of education: Not on file  ? Highest education level: Not on file  ?Occupational History  ? Occupation: human resources  ?  Employer: [employer]  ?Tobacco Use  ? Smoking status: Never  ? Smokeless tobacco: Never  ?Vaping Use  ? Vaping Use: Never used  ?Substance and Sexual Activity  ? Alcohol use: Never  ? Drug use: Never  ? Sexual activity: Not Currently  ?  Birth control/protection: None  ?Other Topics Concern  ? Not on file  ?Social History Narrative  ? Right handed  ? ?Social  Determinants of Health  ? ?Financial Resource Strain: Not on file  ?Food Insecurity: Not on file  ?Transportation Needs: Not on file  ?Physical Activity: Not on file  ?Stress: Not on file  ?Social Connections: Not on file  ?Intimate Partner Violence: Not on file  ? ? ?Past Surgical History:  ?Procedure Laterality Date  ? CESAREAN SECTION    ? MOUTH SURGERY  05/2021  ? UPPER GI ENDOSCOPY  04/27/2021  ? ? ?Family History  ?Problem Relation Age of Onset  ? Hyperlipidemia Mother   ? Hypertension Mother   ? Hyperlipidemia Father   ? Hypertension Father   ? Diabetes Father   ? Colon cancer Neg Hx   ? Stomach cancer Neg Hx   ? Esophageal cancer Neg Hx   ? Pancreatic cancer Neg Hx   ? Colon polyps Neg Hx   ? Rectal  cancer Neg Hx   ? ? ?No Known Allergies ? ?Current Outpatient Medications on File Prior to Visit  ?Medication Sig Dispense Refill  ? benzonatate (TESSALON) 100 MG capsule Take 1 capsule (100 mg total) by mouth every 8 (eight) hours. 21 capsule 0  ? ferrous sulfate 324 MG TBEC Take 324 mg by mouth.    ? ferrous sulfate 325 (65 FE) MG tablet TAKE 325 MG BY MOUTH DAILY. 90 tablet 0  ? gabapentin (NEURONTIN) 100 MG capsule Take 1 capsule every night 30 capsule 6  ? ibuprofen (ADVIL) 800 MG tablet Take 1 tablet (800 mg total) by mouth every 6 (six) hours as needed for moderate pain, headache or cramping. 30 tablet 0  ? omeprazole (PRILOSEC) 40 MG capsule Take 1 capsule (40 mg total) by mouth 2 (two) times daily. 90 capsule 3  ? ondansetron (ZOFRAN-ODT) 8 MG disintegrating tablet Take 1 tablet (8 mg total) by mouth every 8 (eight) hours as needed for nausea or vomiting. 20 tablet 0  ? Vitamin D, Ergocalciferol, (DRISDOL) 1.25 MG (50000 UNIT) CAPS capsule Take 1 capsule (50,000 Units total) by mouth every 7 (seven) days. 12 capsule 1  ? ?Current Facility-Administered Medications on File Prior to Visit  ?Medication Dose Route Frequency Provider Last Rate Last Admin  ? cyanocobalamin ((VITAMIN B-12)) injection 1,000 mcg  1,000 mcg Intramuscular Q30 days Carollee Herter, Kendrick Fries R, DO   1,000 mcg at 07/17/21 1444  ? ? ?BP 96/68   Pulse 95   Temp 98.6 ?F (37 ?C)   Resp 18   Ht '5\' 6"'$  (1.676 m)   Wt 253 lb (114.8 kg)   LMP 08/26/2021   SpO2 100%   BMI 40.84 kg/m?  ? 110/70 ? ?   ?Objective:  ? Physical Exam ? ?General- No acute distress. Pleasant patient. ?Neck- Full range of motion, no jvd ?Lungs- Clear, even and unlabored. ?Heart- regular rate and rhythm. ?Neurologic- CNII- XII grossly intact.  ?Heent- no sinus pressure.  ?Back- no cva tenderness.  ?Lower ext no pedal edema. Negative homans signs.  ? ? ?   ?Assessment & Plan:  ? ?Patient Instructions  ?Severe cough during the exam.Probable bronchitis.  Negative work up in  ED. Particularly no covid and cxr did not show pneumonia at that time. However symptoms persist.  ? ?Will rx tapered prednisone for 6 days, hycodan cough med and azithromycin antibiotic. ? ?If symptoms persist despite the above then recommend getting chest xray again. ? ?Follow up 7 days or sooner if needed.  ? ?Mackie Pai, PA-C  ?

## 2021-09-21 ENCOUNTER — Encounter: Payer: No Typology Code available for payment source | Attending: General Surgery | Admitting: Skilled Nursing Facility1

## 2021-09-21 ENCOUNTER — Encounter: Payer: Self-pay | Admitting: Skilled Nursing Facility1

## 2021-09-21 DIAGNOSIS — E786 Lipoprotein deficiency: Secondary | ICD-10-CM | POA: Insufficient documentation

## 2021-09-21 DIAGNOSIS — E559 Vitamin D deficiency, unspecified: Secondary | ICD-10-CM | POA: Insufficient documentation

## 2021-09-21 DIAGNOSIS — K449 Diaphragmatic hernia without obstruction or gangrene: Secondary | ICD-10-CM | POA: Insufficient documentation

## 2021-09-21 DIAGNOSIS — K2100 Gastro-esophageal reflux disease with esophagitis, without bleeding: Secondary | ICD-10-CM | POA: Insufficient documentation

## 2021-09-21 DIAGNOSIS — E669 Obesity, unspecified: Secondary | ICD-10-CM

## 2021-09-21 DIAGNOSIS — Z6841 Body Mass Index (BMI) 40.0 and over, adult: Secondary | ICD-10-CM | POA: Diagnosis not present

## 2021-09-21 DIAGNOSIS — D509 Iron deficiency anemia, unspecified: Secondary | ICD-10-CM | POA: Insufficient documentation

## 2021-09-21 NOTE — Progress Notes (Addendum)
Nutrition Assessment for Bariatric Surgery ?Medical Nutrition Therapy ?Appt Start Time: 4:40    End Time: 5:39 ? ?Patient was seen on 09/21/2021 for Pre-Operative Nutrition Assessment. Letter of approval faxed to [facility] bariatric surgery program coordinator on 09/21/2021.  ? ?Referral stated Supervised Weight Loss (SWL) visits needed: 6 ? ?Planned surgery: RYGB ?Pt expectation of surgery: to lose weight ?Pt expectation of dietitian: none stated ? ?  ?NUTRITION ASSESSMENT ?  ?Anthropometrics  ?Start weight at [facility]: 258.9 lbs (date: 09/21/2021)  ?Height: 66 in ?BMI: 41.79 kg/m2   ?  ?Clinical  ?Medical hx: GERD, anxiety ?Medications: vitamin D, iron, B12, omeprazole  ?Labs: HDL 32.10, Ferritin 8.5, vitamin 18.63, hemoglobin 11.1, HCT 33.5, RDW 15.7 ?Notable signs/symptoms: headaches, fatigue, joint pain, back pain, low energy, knee pain ?Any previous deficiencies? YES: b12, vitamin D ? ?Micronutrient Nutrition Focused Physical Exam: ?Hair: No issues observed ?Eyes: No issues observed ?Mouth: No issues observed ?Neck: No issues observed ?Nails: No issues observed ?Skin: No issues observed ? ?Lifestyle & Dietary Hx ? ?Pt states her bones are covered in lesions which are spreading stating she is a medical mystery not knowing if it is cancer or not.   ?Pt states she walks her dogs for about 60 minutes 7 days a week.  ?Pt states she gets heartburn so she does not eat too close to bedtime.  ? ?24-Hr Dietary Recall ?First Meal: jimmy dean bowl ?Snack: popcorn ?Second Meal: chicken or [place] + salad or eaten out cava or chipotle ?Snack: granola bar and celery and hummus ?Third Meal: [place] franks and beans ?Snack:  ?Beverages: juice, water, carbonated water ?  ?Estimated Energy Needs ?Calories: 1500 ? ? ?NUTRITION DIAGNOSIS  ?Overweight/obesity ([place]-3.3) related to past poor dietary habits and physical inactivity as evidenced by patient w/ planned RYGB surgery following dietary guidelines for continued weight  loss. ?  ? ?NUTRITION INTERVENTION  ?Nutrition counseling (C-1) and education (E-2) to facilitate bariatric surgery goals. ? ?Educated pt on micronutrient deficiencies post surgery and strategies to mitigate that risk ?  ?Pre-Op Goals Reviewed with the Patient ?Track food and beverage intake (pen and paper, MyFitness Pal, Baritastic app, etc.) ?Make healthy food choices while monitoring portion sizes ?Consume 3 meals per day or try to eat every 3-5 hours ?Avoid concentrated sugars and fried foods ?Keep sugar & fat in the single digits per serving on food labels ?Practice CHEWING your food (aim for applesauce consistency) ?Practice not drinking 15 minutes before, during, and 30 minutes after each meal and snack ?Avoid all carbonated beverages (ex: soda, sparkling beverages)  ?Limit caffeinated beverages (ex: coffee, tea, energy drinks) ?Avoid all sugar-sweetened beverages (ex: regular soda, sports drinks)  ?Avoid alcohol  ?Aim for 64-100 ounces of FLUID daily (with at least half of fluid intake being plain water)  ?Aim for at least 60-80 grams of PROTEIN daily ?Look for a liquid protein source that contains ?15 g protein and ?5 g carbohydrate (ex: shakes, drinks, shots) ?Make a list of non-food related activities ?Physical activity is an important part of a healthy lifestyle so keep it moving! The goal is to reach 150 minutes of exercise per week, including cardiovascular and weight baring activity. ?Work on identifying time pockets throughout the week for yourself  ?Try the recumbent bike in your gym ? ?*Goals that are bolded indicate the pt would like to start working towards these ? ?Handouts Provided Include  ?Bariatric Surgery handouts (Nutrition Visits, Pre-Op Goals, Protein Shakes, Vitamins & Minerals) ? ?Learning Style &  Readiness for Change ?Teaching method utilized: Visual & Auditory  ?Demonstrated degree of understanding via: Teach Back  ?Readiness Level: pre contemplative  ?Barriers to learning/adherence  to lifestyle change: single mother and work schedule ? ?RD's Notes for Next Visit ?Assess pts adherence to chosen goals  ?  ?  ?MONITORING & EVALUATION ?Dietary intake, weekly physical activity, body weight, and pre-op goals reached at next nutrition visit.  ?  ?Next Steps  ?Patient is to follow up at [facility] for Pre-Op Class >2 weeks before surgery for further nutrition education.  ?

## 2021-09-22 ENCOUNTER — Inpatient Hospital Stay: Admission: RE | Admit: 2021-09-22 | Payer: No Typology Code available for payment source | Source: Ambulatory Visit

## 2021-09-22 ENCOUNTER — Other Ambulatory Visit: Payer: No Typology Code available for payment source

## 2021-10-03 ENCOUNTER — Encounter: Payer: Self-pay | Admitting: Skilled Nursing Facility1

## 2021-10-03 ENCOUNTER — Encounter: Payer: No Typology Code available for payment source | Attending: General Surgery | Admitting: Skilled Nursing Facility1

## 2021-10-03 ENCOUNTER — Encounter: Payer: Self-pay | Admitting: Pulmonary Disease

## 2021-10-03 ENCOUNTER — Ambulatory Visit (INDEPENDENT_AMBULATORY_CARE_PROVIDER_SITE_OTHER): Payer: No Typology Code available for payment source | Admitting: Pulmonary Disease

## 2021-10-03 ENCOUNTER — Telehealth: Payer: Self-pay | Admitting: Pulmonary Disease

## 2021-10-03 DIAGNOSIS — E669 Obesity, unspecified: Secondary | ICD-10-CM | POA: Diagnosis present

## 2021-10-03 DIAGNOSIS — R400 Somnolence: Secondary | ICD-10-CM | POA: Diagnosis not present

## 2021-10-03 DIAGNOSIS — F5104 Psychophysiologic insomnia: Secondary | ICD-10-CM | POA: Diagnosis not present

## 2021-10-03 NOTE — Progress Notes (Signed)
? ?Subjective:  ? ? Patient ID: Jodi Norman, female    DOB: 06-06-1995, 26 y.o.   MRN: [id number] ? ?HPI ? ?26 yo never smoker presents for evaluation of sleep disordered breathing ? ?She had a previous consultation 10/2020  for evaluation of bone lesions with suspicion of sarcoid. ?She presented in January 2022 with 2 weeks of sweats, loss of appetite, shortness of breath with sore throat.  She tested positive for COVID.  Chest x-ray was normal CT angiogram was performed which showed sclerotic bone lesions in vertebra, pelvis, clavicular heads and ribs.  PET scan confirmed hypermetabolism in these areas and subcutaneous nodules and in the pharynx and tonsils likely related to COVID.  She underwent bone marrow biopsy of her pelvic lesion which did not show any evidence of cancer but showed a rare noncaseating granuloma. ?>> Refer to dermatology for skin biopsy ? ?Chief Complaint  ?Patient presents with  ? Consult  ?  Family history. Very tired during the day, excessive thirst, dry mouth, insomnia, frequent urination.  ? ?She now presents for evaluation of sleep disordered breathing.  She has been seen by cardiology and PCP and both advised a sleep study.  When she saw dermatology she had no significant skin lesions to biopsy.  She has seen by neurology for trigeminal neuralgia.  She was also evaluated by oncology in the past. ?She reports symptoms of insomnia, dry mouth, excessive thirst and frequent nocturia even if she does not drink enough fluids. ?She reports onset of insomnia about 3 to 4 years ago, she has a 62-year-old daughter who sleeps with her. ?Epworth sleepiness score is 12 and she reports some sleepiness in the daytime while sitting and reading or inactive in a public place or as a passenger in a car. ? ?She gets into bed around 9 PM but does not fall asleep until 2 AM, she is generally on her phone, her mind is very active, she used to be a prone sleeper but currently sleeps on her back, reports at  least 3 nocturnal awakenings due to nocturia and is out of bed by 5 AM to let her dog out with tiredness, headache and dryness of mouth. ?She has gained 70 pounds over the last 2 years. ?There is no history suggestive of cataplexy, sleep paralysis or parasomnias ? ?I note that home sleep test was ordered by cardiology but she feels that she cannot sleep at home since she is disturbed frequently by her 26-year-old daughter and that a home sleep test would not be accurate ? ?No bed partner history is available, she lives by herself ?  ?Significant tests/ events reviewed ?  ?CTA chest 05/2020 sclerotic changes involving the body of ?the sternum as well as sclerotic changes of T4 and T9 vertebra. Additional smaller sclerotic changes involving T7 and L1, clavicular heads and ribs.  ?Mildly enlarged lymph node in the lower neck to the right of the ?trachea ?  ?Pelvic bone biopsy 06/2020 rare noncaseating granuloma ?  ?PET 06/2020 diffuse sclerotic disease including pelvis, hypermetabolic.  Hypermetabolic, pharynx and tonsils, mildly hypermetabolic subcu nodules ?  ?PET scan 09/2020 no change in bone lesions , neck lymphadenopathy is resolved, hypermetabolic lesions in left and right cheek ? ?ACE level normal ?ANA 180 homogeneous pattern ? ? ? ?Past Medical History:  ?Diagnosis Date  ? Generalized anxiety disorder 06/24/2020  ? GERD (gastroesophageal reflux disease)   ? ? ?Past Surgical History:  ?Procedure Laterality Date  ? CESAREAN SECTION    ?  MOUTH SURGERY  05/2021  ? UPPER GI ENDOSCOPY  04/27/2021  ? ? ?No Known Allergies ? ? ?Social History  ? ?Socioeconomic History  ? Marital status: Single  ?  Spouse name: Not on file  ? Number of children: Not on file  ? Years of education: Not on file  ? Highest education level: Not on file  ?Occupational History  ? Occupation: human resources  ?  Employer: [employer]  ?Tobacco Use  ? Smoking status: Never  ? Smokeless tobacco: Never  ?Vaping Use  ? Vaping Use: Never used   ?Substance and Sexual Activity  ? Alcohol use: Never  ? Drug use: Never  ? Sexual activity: Not Currently  ?  Birth control/protection: None  ?Other Topics Concern  ? Not on file  ?Social History Narrative  ? Right handed  ? ?Social Determinants of Health  ? ?Financial Resource Strain: Not on file  ?Food Insecurity: Not on file  ?Transportation Needs: Not on file  ?Physical Activity: Not on file  ?Stress: Not on file  ?Social Connections: Not on file  ?Intimate Partner Violence: Not on file  ? ? ?Family History  ?Problem Relation Age of Onset  ? Hyperlipidemia Mother   ? Hypertension Mother   ? Hyperlipidemia Father   ? Hypertension Father   ? Diabetes Father   ? Colon cancer Neg Hx   ? Stomach cancer Neg Hx   ? Esophageal cancer Neg Hx   ? Pancreatic cancer Neg Hx   ? Colon polyps Neg Hx   ? Rectal cancer Neg Hx   ? ? ? ?Review of Systems ?Shortness of breath with activity ?Acid heartburn ?Weight gain ?Headaches ?Nasal congestion ?Anxiety ?Joint stiffness ? ?Constitutional: negative for anorexia, fevers and sweats  ?Eyes: negative for irritation, redness and visual disturbance  ?Ears, nose, mouth, throat, and face: negative for earaches, epistaxis,  and sore throat  ?Respiratory: negative for cough, sputum and wheezing  ?Cardiovascular: negative for chest pain, dyspnea, lower extremity edema, orthopnea, palpitations and syncope  ?Gastrointestinal: negative for abdominal pain, constipation, diarrhea, melena, nausea and vomiting  ?Genitourinary:negative for dysuria, frequency and hematuria  ?Hematologic/lymphatic: negative for bleeding, easy bruising and lymphadenopathy  ?Musculoskeletal:negative for arthralgias, muscle weakness  ?Neurological: negative for coordination problems, gait problems, headaches and weakness  ?Endocrine: negative for diabetic symptoms including polydipsia and weight loss ? ?   ?Objective:  ? Physical Exam ? ?Gen. Pleasant, obese, in no distress, normal affect ?ENT - no pallor,icterus, no  post nasal drip, class 2-3 airway ?Neck: No JVD, no thyromegaly, no carotid bruits ?Lungs: no use of accessory muscles, no dullness to percussion, decreased without rales or rhonchi  ?Cardiovascular: Rhythm regular, heart sounds  normal, no murmurs or gallops, no peripheral edema ?Abdomen: soft and non-tender, no hepatosplenomegaly, BS normal. ?Musculoskeletal: No deformities, no cyanosis or clubbing ?Neuro:  alert, non focal, no tremors ? ? ? ?   ?Assessment & Plan:  ? ? ?

## 2021-10-03 NOTE — Patient Instructions (Signed)
? ?  Rules of sleep hygiene were discussed  ?- light exercise ?-avoid caffeinated beverages ?- no more than 20 mins staying awake in bed, if not asleep, get out of bed & reading or light music ?- No TV or computer games at bedtime. ?-Bedtime at 11 , wake up at 5 ? ?X Split night study ?

## 2021-10-03 NOTE — Assessment & Plan Note (Signed)
Rules of sleep hygiene were discussed  ?- light exercise ?-avoid caffeinated beverages ?- no more than 20 mins staying awake in bed, if not asleep, get out of bed & reading or light music ?- No TV or computer games at bedtime. ?-Bedtime at 11 , wake up at 5 ?

## 2021-10-03 NOTE — Progress Notes (Signed)
Supervised Weight Loss Visit ?Bariatric Nutrition Education ? ?1 out of 6 [facility] Appointments  ? ?Planned surgery: RYGB ? ?  ?NUTRITION ASSESSMENT ?  ?Anthropometrics  ?Start weight at [facility]: 258.9 lbs (date: 09/21/2021)  ?Weight: 264 pounds ?BMI: 42.68 kg/m2   ?  ?Clinical  ?Medical hx: GERD, anxiety ?Medications: vitamin D, iron, B12, omeprazole; biotin, iron, fenugreek, collegan, fish oil, foic acid, vimtain C, vimtain D ?Labs: HDL 32.10, Ferritin 8.5, vitamin 18.63, hemoglobin 11.1, HCT 33.5, RDW 15.7, calcium ?Notable signs/symptoms: headaches, fatigue, joint pain, back pain, low energy, knee pain ?Any previous deficiencies? YES: b12, vitamin D ? ?Lifestyle & Dietary Hx ? ?Pt states her bones are covered in lesions which are spreading stating she is a medical mystery not knowing if it is cancer or not stating she has let this go and is no longer something she is concerned with and feels her doctors have done all the screening they could do.   ? ? ?Estimated daily fluid intake:  oz ?Supplements: vitamin D, iron, B12,  biotin, iron, fenugreek, collegan, fish oil, foic acid, vimtain C, vimtain D ?Current average weekly physical activity: ADL's ? ?24-Hr Dietary Recall ?First Meal: jimmy dean bowl or 1 1/2 oatmeal packets + almond ?Snack: popcorn ?Second Meal 2-3: chicken or [place] + Cesar salad or eaten out cava or chipotle ?Snack: granola bar and celery and hummus or cucumber + ranch seasoning + lime or tomato ad cucumber in rice vinegar   ?Third Mea 6-7pm: popcorn or granola or yogurt ?Snack:  ?Beverages: juice, water, carbonated water ? ?Estimated Energy Needs ?Calories: 1500 ? ? ?NUTRITION DIAGNOSIS  ?Overweight/obesity ([place]-3.3) related to past poor dietary habits and physical inactivity as evidenced by patient w/ planned RYGB surgery following dietary guidelines for continued weight loss. ? ? ?NUTRITION INTERVENTION  ?Nutrition counseling (C-1) and education (E-2) to facilitate bariatric surgery goals. ? ?Pre-Op  Goals Progress & New Goals ? ? ?Handouts Provided Include  ? ? ?Learning Style & Readiness for Change ?Teaching method utilized: Visual & Auditory  ?Demonstrated degree of understanding via: Teach Back  ?Readiness Level: contemplative ?Barriers to learning/adherence to lifestyle change: single mother ? ?RD's Notes for next Visit  ?Assess adherence to chosen goals ? ? ?MONITORING & EVALUATION ?Dietary intake, weekly physical activity, body weight, and pre-op goals in 1 month.  ? ?Next Steps  ?Patient is to return to [facility] in 1 month ?

## 2021-10-03 NOTE — Assessment & Plan Note (Signed)
Given excessive daytime somnolence, narrow pharyngeal exam & loud snoring, obstructive sleep apnea is very likely & an overnight polysomnogram will be scheduled as a split study. The pathophysiology of obstructive sleep apnea , it's cardiovascular consequences & modes of treatment including CPAP were discused with the patient in detail & they evidenced understanding. ? ?I feel that a home sleep study would be inadequate due to her 26-year-old and her poor sleep hygiene with comorbid insomnia. ?

## 2021-10-11 ENCOUNTER — Ambulatory Visit
Admission: RE | Admit: 2021-10-11 | Discharge: 2021-10-11 | Disposition: A | Discharge status: Home / Self Care | Payer: No Typology Code available for payment source | Source: Ambulatory Visit | Attending: Neurology | Admitting: Neurology

## 2021-10-11 DIAGNOSIS — G500 Trigeminal neuralgia: Secondary | ICD-10-CM

## 2021-10-11 DIAGNOSIS — R42 Dizziness and giddiness: Secondary | ICD-10-CM

## 2021-10-12 NOTE — Telephone Encounter (Signed)
Closing encounter as it was open in error.

## 2021-10-25 ENCOUNTER — Telehealth: Payer: Self-pay | Admitting: Pulmonary Disease

## 2021-10-26 ENCOUNTER — Encounter: Payer: Self-pay | Admitting: Pulmonary Disease

## 2021-10-26 NOTE — Telephone Encounter (Signed)
Letter sent to patient's mychart, patient is aware. Nothing further needed.

## 2021-10-26 NOTE — Telephone Encounter (Signed)
Patient checking on letter for work. Patient phone number is [phone].

## 2021-11-01 ENCOUNTER — Encounter: Payer: No Typology Code available for payment source | Attending: Surgery | Admitting: Dietician

## 2021-11-01 ENCOUNTER — Encounter: Payer: Self-pay | Admitting: Dietician

## 2021-11-01 ENCOUNTER — Ambulatory Visit: Payer: No Typology Code available for payment source | Admitting: Pulmonary Disease

## 2021-11-01 DIAGNOSIS — E669 Obesity, unspecified: Secondary | ICD-10-CM | POA: Diagnosis present

## 2021-11-01 NOTE — Progress Notes (Signed)
Supervised Weight Loss Visit Bariatric Nutrition Education  2 out of 6 [facility] Appointments   Planned surgery: RYGB    NUTRITION ASSESSMENT   Anthropometrics  Start weight at [facility]: 258.9 lbs (date: 09/21/2021)  Height: 66 in Weight: 262.3 pounds BMI: 42.34 kg/m2     Clinical  Medical hx: GERD, anxiety Medications: vitamin D, iron, B12, omeprazole; biotin, iron, fenugreek, collegan, fish oil, foic acid, vimtain C, vimtain D Labs: HDL 32.10, Ferritin 8.5, vitamin 18.63, hemoglobin 11.1, HCT 33.5, RDW 15.7, calcium Notable signs/symptoms: headaches, fatigue, joint pain, back pain, low energy, knee pain Any previous deficiencies? YES: b12, vitamin D  Lifestyle & Dietary Hx  Pt arrived appearing to be cheerful and receptive. Pt states she has 2 premier protein shakes a day. Dietitian advised to get her protein from foods.  Estimated daily fluid intake:  oz Supplements: vitamin D, iron, B12,  biotin, iron, fenugreek, collegan, fish oil, foic acid, vimtain C, vimtain D Current average weekly physical activity: ADL's  24-Hr Dietary Recall First Meal: Overnight oats, fruit, [place] biscuit/cookie Snack: popcorn Second Meal 2-3: tuna or salad or eat out at [employer] or [employer] Snack: granola bar and celery and hummus or cucumber + ranch seasoning + lime or tomato ad cucumber in rice vinegar   Third Mea 6-7pm: meat with greens or skip because no appetite  Snack:  Beverages: juice, water, carbonated water   NUTRITION DIAGNOSIS  Overweight/obesity ([place]-3.3) related to past poor dietary habits and physical inactivity as evidenced by patient w/ planned RYGB surgery following dietary guidelines for continued weight loss.   NUTRITION INTERVENTION  Nutrition counseling (C-1) and education (E-2) to facilitate bariatric surgery goals.  Pre-Op Goals Progress & New Goals Eat 3 meals a day and snacks like fruit or non-starchy vegetable paired with a protein like peanut butter or humus.  Try  tuna/salmon or beans at meals, as to not rely on protein shake to meet needs.  Handouts Provided Include    Learning Style & Readiness for Change Teaching method utilized: Visual & Auditory  Demonstrated degree of understanding via: Teach Back  Readiness Level: contemplative Barriers to learning/adherence to lifestyle change: single mother  RD's Notes for next Visit  Assess adherence to chosen goals   MONITORING & EVALUATION Dietary intake, weekly physical activity, body weight, and pre-op goals in 1 month.   Next Steps  Patient is to return to [facility] in 1 month

## 2021-11-02 ENCOUNTER — Ambulatory Visit: Payer: No Typology Code available for payment source | Admitting: Dietician

## 2021-11-13 ENCOUNTER — Telehealth: Payer: Self-pay

## 2021-11-13 NOTE — Telephone Encounter (Signed)
Pt called in wanting to schedule an apt today with Dr Marin Olp as she said she is off work today. Advised pt that Dr Marin Olp does not have any openings this afternoon and that I would need to send her to scheduling for a future apt. Pt asked to speak with Roselyn Reef and I advised her she is out of the office today. Pt had concerns regarding her biopsy from 04/2021. Per Judson Roch this needs to be addressed by PCP whom ordered it. Went to advise pt and transfer to scheduling and pt hung up. Per Dr Marin Olp pt needs to f/u with her orthopedic surgeon. Pt did not have cancer.

## 2021-11-14 ENCOUNTER — Other Ambulatory Visit: Payer: Self-pay | Admitting: *Deleted

## 2021-11-14 ENCOUNTER — Telehealth: Payer: Self-pay | Admitting: Pulmonary Disease

## 2021-11-14 ENCOUNTER — Ambulatory Visit (INDEPENDENT_AMBULATORY_CARE_PROVIDER_SITE_OTHER): Payer: No Typology Code available for payment source | Admitting: Family Medicine

## 2021-11-14 ENCOUNTER — Encounter: Payer: Self-pay | Admitting: Family Medicine

## 2021-11-14 VITALS — BP 113/72 | HR 88 | Temp 98.6°F | Resp 16 | Ht 67.0 in | Wt 262.0 lb

## 2021-11-14 DIAGNOSIS — D508 Other iron deficiency anemias: Secondary | ICD-10-CM | POA: Diagnosis not present

## 2021-11-14 DIAGNOSIS — Z8719 Personal history of other diseases of the digestive system: Secondary | ICD-10-CM | POA: Diagnosis not present

## 2021-11-14 DIAGNOSIS — M2550 Pain in unspecified joint: Secondary | ICD-10-CM | POA: Insufficient documentation

## 2021-11-14 DIAGNOSIS — E8881 Metabolic syndrome: Secondary | ICD-10-CM

## 2021-11-14 MED ORDER — TRAMADOL-ACETAMINOPHEN 37.5-325 MG PO TABS: 1.0000 | ORAL_TABLET | Freq: Four times a day (QID) | ORAL | 0 refills | Status: DC | PRN

## 2021-11-14 MED ORDER — OMEPRAZOLE 40 MG PO CPDR: 40.0000 mg | DELAYED_RELEASE_CAPSULE | Freq: Two times a day (BID) | ORAL | 3 refills | Status: DC

## 2021-11-14 NOTE — Progress Notes (Signed)
Established Patient Office Visit  Subjective   Patient ID: Jodi Norman, female    DOB: 12-26-1995  Age: 26 y.o. MRN: [id number]  Chief Complaint  Patient presents with   joint pain all over    vitamin levels checked   Medication Refill    Omeprazole and tramadol     HPI  Patient Active Problem List   Diagnosis Date Noted   Arthralgia 11/14/2021   Iron deficiency anemia 08/17/2021   GERD (gastroesophageal reflux disease)    Morbid obesity ([place]) 06/05/2021   History of scoliosis 03/06/2021   Chronic midline back pain 03/06/2021   Bone lesion 10/27/2020   Daytime somnolence 07/11/2020   Other fatigue 41/93/7902   Metabolic syndrome 40/97/3532   History of pre-eclampsia  07/11/2020   Obesity (BMI 30-39.9) 07/11/2020   Insomnia 07/08/2020   Intractable migraine without aura and without status migrainosus 07/08/2020   Chest pain 07/08/2020   Generalized anxiety disorder 06/24/2020   Carpal tunnel syndrome, bilateral 05/07/2018   Cesarean delivery delivered 04/28/2018   Right hand paresthesia 06/19/2017   Past Medical History:  Diagnosis Date   Generalized anxiety disorder 06/24/2020   GERD (gastroesophageal reflux disease)    Past Surgical History:  Procedure Laterality Date   CESAREAN SECTION     MOUTH SURGERY  05/2021   UPPER GI ENDOSCOPY  04/27/2021   Social History   Tobacco Use   Smoking status: Never   Smokeless tobacco: Never  Vaping Use   Vaping Use: Never used  Substance Use Topics   Alcohol use: Never   Drug use: Never   Social History   Socioeconomic History   Marital status: Single    Spouse name: Not on file   Number of children: Not on file   Years of education: Not on file   Highest education level: Not on file  Occupational History   Occupation: Psychologist, occupational: [employer]  Tobacco Use   Smoking status: Never   Smokeless tobacco: Never  Vaping Use   Vaping Use: Never used  Substance and Sexual Activity    Alcohol use: Never   Drug use: Never   Sexual activity: Not Currently    Birth control/protection: None  Other Topics Concern   Not on file  Social History Narrative   Right handed   Social Determinants of Health   Financial Resource Strain: Not on file  Food Insecurity: Not on file  Transportation Needs: Not on file  Physical Activity: Not on file  Stress: Not on file  Social Connections: Not on file  Intimate Partner Violence: Not on file   Family Status  Relation Name Status   Mother  (Not Specified)   Father  (Not Specified)   Neg Hx  (Not Specified)   Family History  Problem Relation Age of Onset   Hyperlipidemia Mother    Hypertension Mother    Hyperlipidemia Father    Hypertension Father    Diabetes Father    Colon cancer Neg Hx    Stomach cancer Neg Hx    Esophageal cancer Neg Hx    Pancreatic cancer Neg Hx    Colon polyps Neg Hx    Rectal cancer Neg Hx    No Known Allergies    ROS    Objective:     BP 113/72 (BP Location: Right Arm, Cuff Size: Large)   Pulse 88   Temp 98.6 F (37 C) (Oral)   Resp 16   Ht  $'5\' 7"'o$  (1.702 m)   Wt 262 lb (118.8 kg)   LMP 07/28/2021   SpO2 100%   BMI 41.04 kg/m  BP Readings from Last 3 Encounters:  11/14/21 113/72  10/03/21 116/76  09/20/21 110/70   Wt Readings from Last 3 Encounters:  11/14/21 262 lb (118.8 kg)  11/01/21 262 lb 4.8 oz (119 kg)  10/03/21 264 lb 6.4 oz (119.9 kg)   SpO2 Readings from Last 3 Encounters:  11/14/21 100%  10/03/21 99%  09/20/21 100%      Physical Exam Vitals and nursing note reviewed.  Constitutional:      Appearance: She is well-developed.  HENT:     Head: Normocephalic and atraumatic.  Eyes:     Conjunctiva/sclera: Conjunctivae normal.  Neck:     Thyroid: No thyromegaly.     Vascular: No carotid bruit or JVD.  Cardiovascular:     Rate and Rhythm: Normal rate and regular rhythm.     Heart sounds: Normal heart sounds. No murmur heard. Pulmonary:     Effort:  Pulmonary effort is normal. No respiratory distress.     Breath sounds: Normal breath sounds. No wheezing or rales.  Chest:     Chest wall: No tenderness.  Musculoskeletal:     Cervical back: Normal range of motion and neck supple.  Neurological:     Mental Status: She is alert and oriented to person, place, and time.      No results found for any visits on 11/14/21.  Last CBC Lab Results  Component Value Date   WBC 6.8 07/17/2021   HGB 11.1 (L) 07/17/2021   HCT 33.5 (L) 07/17/2021   MCV 79.0 07/17/2021   MCH 26.5 07/04/2021   RDW 15.7 (H) 07/17/2021   PLT 382.0 32/99/2426   Last metabolic panel Lab Results  Component Value Date   GLUCOSE 95 08/08/2021   NA 136 08/08/2021   K 3.8 08/08/2021   CL 104 08/08/2021   CO2 26 08/08/2021   BUN 8 08/08/2021   CREATININE 0.64 08/08/2021   GFRNONAA >60 07/04/2021   CALCIUM 9.2 08/08/2021   PROT 7.6 08/08/2021   ALBUMIN 4.1 08/08/2021   BILITOT 0.2 08/08/2021   ALKPHOS 95 08/08/2021   AST 14 08/08/2021   ALT 9 08/08/2021   ANIONGAP 9 07/04/2021   Last lipids Lab Results  Component Value Date   CHOL 135 08/08/2021   HDL 32.10 (L) 08/08/2021   LDLCALC 73 08/08/2021   TRIG 148.0 08/08/2021   CHOLHDL 4 08/08/2021   Last hemoglobin A1c Lab Results  Component Value Date   HGBA1C 5.6 07/11/2020   Last thyroid functions Lab Results  Component Value Date   TSH 1.60 07/17/2021   Last vitamin D Lab Results  Component Value Date   VD25OH 18.63 (L) 07/17/2021   Last vitamin B12 and Folate Lab Results  Component Value Date   VITAMINB12 >1504 (H) 07/17/2021      The ASCVD Risk score (Arnett DK, et al., 2019) failed to calculate for the following reasons:   The 2019 ASCVD risk score is only valid for ages 38 to 17    Assessment & Plan:   Problem List Items Addressed This Visit       Unprioritized   Iron deficiency anemia    Check labs       Arthralgia - Primary    Refill tramadol  Check labs        Relevant Medications   traMADol-acetaminophen (ULTRACET) 37.5-325 MG tablet  Other Relevant Orders   Antinuclear Antib (ANA)   CBC with Differential/Platelet   Comprehensive metabolic panel   IBC panel   Lupus anticoagulant panel( [facility])   Rheumatoid Factor   Sedimentation rate   TSH   Vitamin B12   VITAMIN D 25 Hydroxy (Vit-D Deficiency, Fractures)   Other Visit Diagnoses     History of gastroesophageal reflux (GERD)       Relevant Medications   omeprazole (PRILOSEC) 40 MG capsule       No follow-ups on file.    Ann Held, DO

## 2021-11-14 NOTE — Telephone Encounter (Signed)
[id number] ke to Curt Bears & asked her to keep case# -[id number].  Nothing further needed at this time.

## 2021-11-14 NOTE — Patient Instructions (Signed)
Joint Pain Joint pain may be caused by many things. Joint pain is likely to go away when you follow instructions from your health care provider for relieving pain at home. However, joint pain can also be caused by conditions that require more treatment. Common causes of joint pain include: Bruising in the area of the joint. Injury caused by repeating certain movements too many times. Age-related joint wear and tear. Buildup of uric acid crystals in the joint (gout). Inflammation of the joint. Other forms of arthritis. Infections of the joint or of the bone. Your health care provider may recommend that you take pain medicine or wear a supportive device like an elastic bandage, sling, or splint. If your joint pain continues, you may need lab or imaging tests to diagnose the cause of your joint pain. Follow these instructions at home: Managing pain, stiffness, and swelling     If directed, put ice on the painful area. To do this: If you have a removable elastic bandage, sling, or splint, take it off as told by your doctor. Put ice in a plastic bag. Place a towel between your skin and the bag. Leave the ice on for 20 minutes, 2-3 times a day. Remove the ice if your skin turns bright red. This is very important. If you cannot feel pain, heat, or cold, you have a greater risk of damage to the area. Move your fingers and toes often to reduce stiffness and swelling. Raise the injured area above the level of your heart while you are sitting or lying down. If directed, apply heat to the painful area as often as told by your health care provider. Use the heat source that your health care provider recommends, such as a moist heat pack or a heating pad. Place a towel between your skin and the heat source. Leave the heat on for 20-30 minutes. Remove the heat if your skin turns bright red. This is especially important if you are unable to feel pain, heat, or cold. You have a greater risk of getting  burned.  Activity Rest as told by your health care provider. Do not do anything that causes or worsens pain. Begin exercising or stretching the affected area as told by your health care provider. Return to your normal activities as told by your health care provider. Ask your health care provider what activities are safe for you. If you have an elastic bandage, sling, or splint: Wear the bandage, sling, or splint as told by your health care provider. Remove it only as told by your health care provider. Loosen it if your fingers or toes below the joint tingle, become numb, or turn cold and blue. Keep it clean. Ask your health care provider if you should remove it before bathing. If the bandage, sling, or splint is not waterproof: Do not let it get wet. Cover it with a watertight covering when you take a bath or shower. General instructions Treatment may include medicines for pain and inflammation that are taken by mouth or applied to the skin. Take over-the-counter and prescription medicines only as told by your health care provider. Do not use any products that contain nicotine or tobacco, such as cigarettes, e-cigarettes, and chewing tobacco. If you need help quitting, ask your health care provider. Keep all follow-up visits. This is important. Contact a health care provider if: You have pain that gets worse and does not get better with medicine. Your joint pain does not improve within 3 days. You have increased   bruising or swelling. You have a fever. You lose 10 lb (4.5 kg) or more without trying. Get help right away if: You cannot move the joint. Your fingers or toes tingle, become numb, or turn cold and blue. You have a fever along with a joint that is red, warm, and swollen. Summary Joint pain may be caused by many things. Your health care provider may recommend that you take pain medicine or wear a supportive device such as an elastic bandage, sling, or splint. If your joint pain  continues, you may need tests to diagnose the cause of your joint pain. Take over-the-counter and prescription medicines only as told by your health care provider. This information is not intended to replace advice given to you by your health care provider. Make sure you discuss any questions you have with your health care provider. Document Revised: 08/26/2019 Document Reviewed: 08/26/2019 Elsevier Patient Education  2023 [employer]  

## 2021-11-14 NOTE — Assessment & Plan Note (Signed)
Refill tramadol  Check labs

## 2021-11-14 NOTE — Assessment & Plan Note (Signed)
Check labs 

## 2021-11-15 ENCOUNTER — Inpatient Hospital Stay: Payer: No Typology Code available for payment source | Attending: Hematology & Oncology

## 2021-11-15 ENCOUNTER — Telehealth: Payer: Self-pay | Admitting: Pulmonary Disease

## 2021-11-15 ENCOUNTER — Other Ambulatory Visit: Payer: Self-pay | Admitting: *Deleted

## 2021-11-15 ENCOUNTER — Other Ambulatory Visit: Payer: No Typology Code available for payment source

## 2021-11-15 ENCOUNTER — Encounter: Payer: Self-pay | Admitting: Hematology & Oncology

## 2021-11-15 ENCOUNTER — Other Ambulatory Visit: Payer: Self-pay | Admitting: Lab

## 2021-11-15 ENCOUNTER — Inpatient Hospital Stay (HOSPITAL_BASED_OUTPATIENT_CLINIC_OR_DEPARTMENT_OTHER): Payer: No Typology Code available for payment source | Admitting: Hematology & Oncology

## 2021-11-15 ENCOUNTER — Telehealth: Payer: Self-pay

## 2021-11-15 ENCOUNTER — Ambulatory Visit
Admission: EM | Admit: 2021-11-15 | Discharge: 2021-11-15 | Disposition: A | Discharge status: Home / Self Care | Payer: No Typology Code available for payment source | Attending: Emergency Medicine | Admitting: Emergency Medicine

## 2021-11-15 ENCOUNTER — Other Ambulatory Visit: Payer: Self-pay

## 2021-11-15 VITALS — BP 120/62 | HR 79 | Temp 98.2°F | Resp 18 | Ht 67.0 in | Wt 260.0 lb

## 2021-11-15 DIAGNOSIS — M2550 Pain in unspecified joint: Secondary | ICD-10-CM | POA: Diagnosis not present

## 2021-11-15 DIAGNOSIS — R948 Abnormal results of function studies of other organs and systems: Secondary | ICD-10-CM | POA: Diagnosis present

## 2021-11-15 DIAGNOSIS — Z79899 Other long term (current) drug therapy: Secondary | ICD-10-CM | POA: Insufficient documentation

## 2021-11-15 DIAGNOSIS — H1010 Acute atopic conjunctivitis, unspecified eye: Secondary | ICD-10-CM | POA: Diagnosis not present

## 2021-11-15 LAB — CBC WITH DIFFERENTIAL (CANCER CENTER ONLY)
Abs Immature Granulocytes: 0.03 10*3/uL (ref 0.00–0.07)
Basophils Absolute: 0 10*3/uL (ref 0.0–0.1)
Basophils Relative: 0 %
Eosinophils Absolute: 0.1 10*3/uL (ref 0.0–0.5)
Eosinophils Relative: 2 %
HCT: 34.6 % — ABNORMAL LOW (ref 36.0–46.0)
Hemoglobin: 11.2 g/dL — ABNORMAL LOW (ref 12.0–15.0)
Immature Granulocytes: 1 %
Lymphocytes Relative: 32 %
Lymphs Abs: 2.1 10*3/uL (ref 0.7–4.0)
MCH: 26.2 pg (ref 26.0–34.0)
MCHC: 32.4 g/dL (ref 30.0–36.0)
MCV: 80.8 fL (ref 80.0–100.0)
Monocytes Absolute: 0.6 10*3/uL (ref 0.1–1.0)
Monocytes Relative: 9 %
Neutro Abs: 3.6 10*3/uL (ref 1.7–7.7)
Neutrophils Relative %: 56 %
Platelet Count: 250 10*3/uL (ref 150–400)
RBC: 4.28 MIL/uL (ref 3.87–5.11)
RDW: 16.7 % — ABNORMAL HIGH (ref 11.5–15.5)
WBC Count: 6.5 10*3/uL (ref 4.0–10.5)
nRBC: 0 % (ref 0.0–0.2)

## 2021-11-15 LAB — CMP (CANCER CENTER ONLY)
ALT: 12 U/L (ref 0–44)
AST: 13 U/L — ABNORMAL LOW (ref 15–41)
Albumin: 4.4 g/dL (ref 3.5–5.0)
Alkaline Phosphatase: 88 U/L (ref 38–126)
Anion gap: 9 (ref 5–15)
BUN: 10 mg/dL (ref 6–20)
CO2: 26 mmol/L (ref 22–32)
Calcium: 9.5 mg/dL (ref 8.9–10.3)
Chloride: 103 mmol/L (ref 98–111)
Creatinine: 0.74 mg/dL (ref 0.44–1.00)
GFR, Estimated: >60 mL/min (ref >60)
Glucose, Bld: 102 mg/dL — ABNORMAL HIGH (ref 70–99)
Potassium: 4.1 mmol/L (ref 3.5–5.1)
Sodium: 138 mmol/L (ref 135–145)
Total Bilirubin: 0.3 mg/dL (ref 0.3–1.2)
Total Protein: 8 g/dL (ref 6.5–8.1)

## 2021-11-15 LAB — LACTATE DEHYDROGENASE: LDH: 174 U/L (ref 98–192)

## 2021-11-15 LAB — SEDIMENTATION RATE: Sed Rate: 28 mm/hr — ABNORMAL HIGH (ref 0–22)

## 2021-11-15 MED ORDER — OLOPATADINE HCL 0.2 % OP SOLN: 1.0000 [drp] | Freq: Every day | OPHTHALMIC | 1 refills | Status: DC

## 2021-11-15 MED ORDER — CETIRIZINE HCL 10 MG PO TABS: 10.0000 mg | ORAL_TABLET | Freq: Every day | ORAL | 1 refills | Status: DC

## 2021-11-15 MED ORDER — CELECOXIB 100 MG PO CAPS: 100.0000 mg | ORAL_CAPSULE | Freq: Two times a day (BID) | ORAL | 3 refills | Status: DC

## 2021-11-15 NOTE — Telephone Encounter (Signed)
Pt was seen on 5/9 and and states a sleep study was to be ordered for her. According to [facility], it was to be a split night study. No order placed. Pt has upcoming OV on 7/13 which was supposed to be f/u to the study. Pt is upset that she hasnt been called to schedule the sleep study. Routing as urgent since order is missing. Please advise.

## 2021-11-15 NOTE — ED Triage Notes (Signed)
Pt presents with concern for pink eye. Pt has redness in her right eye.

## 2021-11-15 NOTE — Discharge Instructions (Signed)
Your symptoms and my physical exam findings are concerning for exacerbation of your underlying allergies.     Please see the list below for recommended medications, dosages and frequencies to provide relief of current symptoms:     Zyrtec (cetirizine): This is an excellent second-generation antihistamine that helps to reduce respiratory inflammatory response to environmental allergens.  In some patients, this medication can cause daytime sleepiness so I recommend that you take 1 tablet daily at bedtime.     Pataday (olopatadine): This is an antihistamine eyedrop that can be used once daily to help relieve dry eyes, itchy eyes and red eyes.  This antihistamine drop not only works for allergic conjunctivitis but is also very helpful with viral conjunctivitis.  Please do not use this drop more than once a day, for best relief please use this in the morning.     If your insurance will not cover your allergy medications, please consider downloading the [employer] app which is free.  You can find considerable discounts on prescription and over-the-counter medications.   If you find that you have not had improvement of your symptoms in the next 5 to 7 days, please follow-up with your primary care provider or return here to urgent care for repeat evaluation and further recommendations.   Thank you for visiting urgent care today.  We appreciate the opportunity to participate in your care.

## 2021-11-15 NOTE — Progress Notes (Signed)
Hematology and Oncology Follow Up Visit  Jodi Norman [id number] March 06, 1996 25 y.o. 11/15/2021   Principle Diagnosis:  Abnormal bone scan with PET positive lesions  Current Therapy:   Observation     Interim History:  Jodi Norman is seen today.  We last saw her about a year ago.  She has had a very unusual history.  We initially saw her because she had bone lesions that were positive on a PET scan.  The doctors felt that this was all cancer.  Despite multiple biopsies, we have never found malignancy which is a blessing.  She did actually have a lymph node that was biopsied back in the late fall.  This was done on 04/27/2021.  The pathology report ([id number]) did not show any obvious malignancy.  There was no granuloma.  There was no monoclonal population of cells.   She does have a positive ANA.  We will send off a double-stranded DNA on the blood to see if this may not indicate that she could have lupus.  She says that she is having more pain.  Again, we have not been able to find any obvious malignancy on her.  She is seeing other physicians.  She is planning on weight reduction surgery.  She has seen surgery for this.  It sounds like this probably will not be done until the Fall.  She has had no change in bowel or bladder habits.  She has had no rashes.  There is been no fever.  She has had little bit of swelling about the ankles.  She says that with her new job, she sits quite a bit.  She has had no cough.  She is felt some lymph nodes but they seem to come and go.  She has had no cough or shortness of breath.  Since we last saw her, she has not had problems with COVID.  Currently, I would have to say that her performance status is probably ECOG 1.   Medications:  Current Outpatient Medications:    Biotin 10000 MCG TABS, Take 10,000 mcg by mouth., Disp: , Rfl:    ferrous sulfate 325 (65 FE) MG tablet, TAKE 325 MG BY MOUTH DAILY., Disp: 90 tablet, Rfl: 0   folic acid  (FOLVITE) 884 MCG tablet, Take 800 mcg by mouth daily., Disp: , Rfl:    gabapentin (NEURONTIN) 100 MG capsule, Take 1 capsule every night, Disp: 30 capsule, Rfl: 6   ibuprofen (ADVIL) 800 MG tablet, Take 1 tablet (800 mg total) by mouth every 6 (six) hours as needed for moderate pain, headache or cramping., Disp: 30 tablet, Rfl: 0   omeprazole (PRILOSEC) 40 MG capsule, Take 1 capsule (40 mg total) by mouth 2 (two) times daily., Disp: 90 capsule, Rfl: 3   traMADol-acetaminophen (ULTRACET) 37.5-325 MG tablet, Take 1 tablet by mouth every 6 (six) hours as needed., Disp: 30 tablet, Rfl: 0   Vitamin D, Ergocalciferol, (DRISDOL) 1.25 MG (50000 UNIT) CAPS capsule, Take 1 capsule (50,000 Units total) by mouth every 7 (seven) days., Disp: 12 capsule, Rfl: 1  Allergies: No Known Allergies  Past Medical History, Surgical history, Social history, and Family History were reviewed and updated.  Review of Systems: Review of Systems  Constitutional:  Negative for appetite change, fatigue, fever and unexpected weight change.  HENT:   Negative for lump/mass, mouth sores, sore throat and trouble swallowing.   Respiratory:  Negative for cough, hemoptysis and shortness of breath.   Cardiovascular:  Negative for leg  swelling and palpitations.  Gastrointestinal:  Negative for abdominal distention, abdominal pain, blood in stool, constipation, diarrhea, nausea and vomiting.  Genitourinary:  Negative for bladder incontinence, dysuria, frequency and hematuria.   Musculoskeletal:  Negative for arthralgias, back pain, gait problem and myalgias.  Skin:  Negative for itching and rash.  Neurological:  Negative for dizziness, extremity weakness, gait problem, headaches, numbness, seizures and speech difficulty.  Hematological:  Does not bruise/bleed easily.  Psychiatric/Behavioral:  Negative for depression and sleep disturbance. The patient is not nervous/anxious.     Physical Exam:  height is '5\' 7"'$  (1.702 m) and weight  is 260 lb 0.6 oz (118 kg). Her oral temperature is 98.2 F (36.8 C). Her blood pressure is 120/62 and her pulse is 79. Her respiration is 18 and oxygen saturation is 100%.   Wt Readings from Last 3 Encounters:  11/15/21 260 lb 0.6 oz (118 kg)  11/14/21 262 lb (118.8 kg)  11/01/21 262 lb 4.8 oz (119 kg)    Physical Exam Vitals reviewed.  HENT:     Head: Normocephalic and atraumatic.  Eyes:     Pupils: Pupils are equal, round, and reactive to light.  Cardiovascular:     Rate and Rhythm: Normal rate and regular rhythm.     Heart sounds: Normal heart sounds.  Pulmonary:     Effort: Pulmonary effort is normal.     Breath sounds: Normal breath sounds.  Abdominal:     General: Bowel sounds are normal.     Palpations: Abdomen is soft.  Musculoskeletal:        General: No tenderness or deformity. Normal range of motion.     Cervical back: Normal range of motion.  Lymphadenopathy:     Cervical: No cervical adenopathy.  Skin:    General: Skin is warm and dry.     Findings: No erythema or rash.  Neurological:     Mental Status: She is alert and oriented to person, place, and time.  Psychiatric:        Behavior: Behavior normal.        Thought Content: Thought content normal.        Judgment: Judgment normal.      Lab Results  Component Value Date   WBC 6.5 11/15/2021   HGB 11.2 (L) 11/15/2021   HCT 34.6 (L) 11/15/2021   MCV 80.8 11/15/2021   PLT 250 11/15/2021     Chemistry      Component Value Date/Time   NA 138 11/15/2021 0903   K 4.1 11/15/2021 0903   CL 103 11/15/2021 0903   CO2 26 11/15/2021 0903   BUN 10 11/15/2021 0903   CREATININE 0.74 11/15/2021 0903      Component Value Date/Time   CALCIUM 9.5 11/15/2021 0903   ALKPHOS 88 11/15/2021 0903   AST 13 (L) 11/15/2021 0903   ALT 12 11/15/2021 0903   BILITOT 0.3 11/15/2021 0903      Impression and Plan: Jodi Norman is a very nice 26 year old African-American female.  Again, we have not found any evidence of  malignancy.  However, I do not want to be too cavalier not thinking that there may not be a problem with cancer.  I realize that she is young.  She looks quite healthy and vigorous.  Again, I want to make sure that we try to cover all bases.  We will get another PET scan on her.  We will see out what the PET scan shows.  Surprisingly, she has a  low sedimentation rate of 28.  When we last checked this it was 100.  I will send in some Celebrex for her.  We will see if this might help with some of the discomfort.  I cannot imagine anything else that we could biopsy.  However, if the PET scan shows is a problem, then we might be able to do another biopsy.  We will see when we have to get her back.  It was nice seeing her.  She has a lot of fun to talk to.  Hopefully, we can get her quality of life back to where she would like.     Volanda Napoleon, MD 6/21/20232:46 PM

## 2021-11-15 NOTE — Telephone Encounter (Signed)
Called and spoke with patient, advised that it takes about a month to get an appointment at the sleep center and the [facility] is awaiting PA from the insurance company.  I let her know that once the [facility] gets authorization to do the Split night sleep test the they will call her to get it scheduled.  She verbalized understanding.  Nothing further needed.  Message sent to [facility] regarding status of Split night sleep study.  I was advised by Earnest Bailey that they are awaiting response from insurance regarding PA.  Advised I would make the patient aware.  Called the [facility] sleep lab to check status of patient being scheduled for Split night sleep study.  I was advised that the [facility] contact them to get them scheduled as they get the PA.  Advised I would contact the [facility].

## 2021-11-15 NOTE — Telephone Encounter (Addendum)
Patient had an 8:30 am lab appointment she showed up at 8:48 am, patient did not wait five minutes and informed Kennyth Lose she had an 9:00 am appointment upstairs and would stop by after she finished with her appointment with Hematology/ Oncology. Patient did not return for labs.   Patient returned to office at 4:30 after lab closed and rescheduled to return at 7:15 am and stated that she would be in at 7:00 am to get to work on time. Patient did not return after making arrangements.

## 2021-11-15 NOTE — ED Provider Notes (Signed)
[facility]    CSN: [id number] Arrival date & time: 11/15/21  1702    HISTORY   Chief Complaint  Patient presents with   Eye Problem   HPI Jodi Norman is a 26 y.o. female. Patient presents urgent care concerned that she may have caught pink eye from her daughter, patient states her right eye has been a little bit red.  Patient denies copious drainage from her right eye, patient denies minimal drainage from her right eye, states she is only noticed that it is red.  Patient states her eye does not itch or burn.  Patient denies vision changes.  Patient denies history of allergies, states she recently relocated to [place] from [place].  The history is provided by the patient.   Past Medical History:  Diagnosis Date   Generalized anxiety disorder 06/24/2020   GERD (gastroesophageal reflux disease)    Patient Active Problem List   Diagnosis Date Noted   Arthralgia 11/14/2021   Iron deficiency anemia 08/17/2021   GERD (gastroesophageal reflux disease)    Morbid obesity ([place]) 06/05/2021   History of scoliosis 03/06/2021   Chronic midline back pain 03/06/2021   Bone lesion 10/27/2020   Daytime somnolence 07/11/2020   Other fatigue 94/85/4627   Metabolic syndrome 03/50/0938   History of pre-eclampsia  07/11/2020   Obesity (BMI 30-39.9) 07/11/2020   Insomnia 07/08/2020   Intractable migraine without aura and without status migrainosus 07/08/2020   Chest pain 07/08/2020   Generalized anxiety disorder 06/24/2020   Carpal tunnel syndrome, bilateral 05/07/2018   Cesarean delivery delivered 04/28/2018   Right hand paresthesia 06/19/2017   Past Surgical History:  Procedure Laterality Date   CESAREAN SECTION     MOUTH SURGERY  05/2021   UPPER GI ENDOSCOPY  04/27/2021   OB History     Gravida  1   Para      Term      Preterm      AB      Living         SAB      IAB      Ectopic      Multiple      Live Births             Home  Medications    Prior to Admission medications   Medication Sig Start Date End Date Taking? Authorizing Provider  Biotin 10000 MCG TABS Take 10,000 mcg by mouth.    [provider]  celecoxib (CELEBREX) 100 MG capsule Take 1 capsule (100 mg total) by mouth 2 (two) times daily. 11/15/21   Volanda Napoleon, MD  ferrous sulfate 325 (65 FE) MG tablet TAKE 325 MG BY MOUTH DAILY. 09/15/21   Saguier, Percell Miller, PA-C  folic acid (FOLVITE) 182 MCG tablet Take 800 mcg by mouth daily.    [provider]  gabapentin (NEURONTIN) 100 MG capsule Take 1 capsule every night 08/29/21   Cameron Sprang, MD  ibuprofen (ADVIL) 800 MG tablet Take 1 tablet (800 mg total) by mouth every 6 (six) hours as needed for moderate pain, headache or cramping. 09/19/21   Carollee Herter, Alferd Apa, DO  omeprazole (PRILOSEC) 40 MG capsule Take 1 capsule (40 mg total) by mouth 2 (two) times daily. 11/14/21   Ann Held, DO  traMADol-acetaminophen (ULTRACET) 37.5-325 MG tablet Take 1 tablet by mouth every 6 (six) hours as needed. 11/14/21   Ann Held, DO  Vitamin D, Ergocalciferol, (DRISDOL) 1.25  MG (50000 UNIT) CAPS capsule Take 1 capsule (50,000 Units total) by mouth every 7 (seven) days. 07/27/21   Ann Held, DO   Family History Family History  Problem Relation Age of Onset   Hyperlipidemia Mother    Hypertension Mother    Hyperlipidemia Father    Hypertension Father    Diabetes Father    Colon cancer Neg Hx    Stomach cancer Neg Hx    Esophageal cancer Neg Hx    Pancreatic cancer Neg Hx    Colon polyps Neg Hx    Rectal cancer Neg Hx    Social History Social History   Tobacco Use   Smoking status: Never   Smokeless tobacco: Never  Vaping Use   Vaping Use: Never used  Substance Use Topics   Alcohol use: Never   Drug use: Never   Allergies   Patient has no known allergies.  Review of Systems Review of Systems Pertinent findings noted in history of present illness.    Physical Exam Triage Vital Signs ED Triage Vitals  Enc Vitals Group     BP 03/24/21 0827 (!) 147/82     Pulse Rate 03/24/21 0827 72     Resp 03/24/21 0827 18     Temp 03/24/21 0827 98.3 F (36.8 C)     Temp Source 03/24/21 0827 Oral     SpO2 03/24/21 0827 98 %     Weight --      Height --      Head Circumference --      Peak Flow --      Pain Score 03/24/21 0826 5     Pain Loc --      Pain Edu? --      Excl. in [place]? --   No data found.  Updated Vital Signs BP 113/74   Pulse 89   Temp 99.4 F (37.4 C)   Resp 19   LMP 07/28/2021   SpO2 95%   Physical Exam Vitals and nursing note reviewed.  Constitutional:      General: She is not in acute distress.    Appearance: Normal appearance. She is not ill-appearing.  HENT:     Head: Normocephalic and atraumatic.     Salivary Glands: Right salivary gland is not diffusely enlarged or tender. Left salivary gland is not diffusely enlarged or tender.     Right Ear: Ear canal and external ear normal. No drainage. A middle ear effusion is present. There is no impacted cerumen. Tympanic membrane is bulging. Tympanic membrane is not injected or erythematous.     Left Ear: Ear canal and external ear normal. No drainage. A middle ear effusion is present. There is no impacted cerumen. Tympanic membrane is bulging. Tympanic membrane is not injected or erythematous.     Ears:     Comments: Bilateral EACs normal, both TMs bulging with clear fluid    Nose: Rhinorrhea present. No nasal deformity, septal deviation, signs of injury, nasal tenderness, mucosal edema or congestion. Rhinorrhea is clear.     Right Nostril: Occlusion present. No foreign body, epistaxis or septal hematoma.     Left Nostril: Occlusion present. No foreign body, epistaxis or septal hematoma.     Right Turbinates: Enlarged, swollen and pale.     Left Turbinates: Enlarged, swollen and pale.     Right Sinus: No maxillary sinus tenderness or frontal sinus tenderness.     Left  Sinus: No maxillary sinus tenderness or frontal sinus tenderness.  Mouth/Throat:     Lips: Pink. No lesions.     Mouth: Mucous membranes are moist. No oral lesions.     Pharynx: Oropharynx is clear. Uvula midline. No posterior oropharyngeal erythema or uvula swelling.     Tonsils: No tonsillar exudate. 0 on the right. 0 on the left.     Comments: Postnasal drip Eyes:     General: Lids are normal. Lids are everted, no foreign bodies appreciated. Vision grossly intact. Gaze aligned appropriately. No allergic shiner.       Right eye: No discharge.        Left eye: No discharge.     Extraocular Movements: Extraocular movements intact.     Conjunctiva/sclera: Conjunctivae normal.     Right eye: Right conjunctiva is not injected. No chemosis, exudate or hemorrhage.    Left eye: Left conjunctiva is not injected. No chemosis, exudate or hemorrhage. Neck:     Trachea: Trachea and phonation normal.  Cardiovascular:     Rate and Rhythm: Normal rate and regular rhythm.     Pulses: Normal pulses.     Heart sounds: Normal heart sounds. No murmur heard.    No friction rub. No gallop.  Pulmonary:     Effort: Pulmonary effort is normal. No accessory muscle usage, prolonged expiration or respiratory distress.     Breath sounds: Normal breath sounds. No stridor, decreased air movement or transmitted upper airway sounds. No decreased breath sounds, wheezing, rhonchi or rales.  Chest:     Chest wall: No tenderness.  Musculoskeletal:        General: Normal range of motion.     Cervical back: Normal range of motion and neck supple. Normal range of motion.  Lymphadenopathy:     Cervical: No cervical adenopathy.  Skin:    General: Skin is warm and dry.     Findings: No erythema or rash.  Neurological:     General: No focal deficit present.     Mental Status: She is alert and oriented to person, place, and time.  Psychiatric:        Mood and Affect: Mood normal.        Behavior: Behavior normal.      Visual Acuity Right Eye Distance:   Left Eye Distance:   Bilateral Distance:    Right Eye Near:   Left Eye Near:    Bilateral Near:     UC Couse / Diagnostics / Procedures:    EKG  Radiology No results found.  Procedures Procedures (including critical care time)  UC Diagnoses / Final Clinical Impressions(s)   I have reviewed the triage vital signs and the nursing notes.  Pertinent labs & imaging results that were available during my care of the patient were reviewed by me and considered in my medical decision making (see chart for details).   Final diagnoses:  Allergic conjunctivitis, unspecified laterality   Physical exam findings concerning for seasonal upper respiratory allergies.  Patient advised to begin Zyrtec and Pataday.  Return precautions advised.  ED Prescriptions     Medication Sig Dispense Auth. Provider   Olopatadine HCl (PATADAY) 0.2 % SOLN Apply 1 drop to eye daily. 2.5 mL Lynden Oxford Scales, PA-C   cetirizine (ZYRTEC ALLERGY) 10 MG tablet Take 1 tablet (10 mg total) by mouth at bedtime. 90 tablet Lynden Oxford Scales, [place]      PDMP not reviewed this encounter.  Pending results:  Labs Reviewed - No data to display  Medications Ordered in UC: Medications -  No data to display  Disposition Upon Discharge:  Condition: stable for discharge home Home: take medications as prescribed; routine discharge instructions as discussed; follow up as advised.  Patient presented with an acute illness with associated systemic symptoms and significant discomfort requiring urgent management. In my opinion, this is a condition that a prudent lay person (someone who possesses an average knowledge of health and medicine) may potentially expect to result in complications if not addressed urgently such as respiratory distress, impairment of bodily function or dysfunction of bodily organs.   Routine symptom specific, illness specific and/or disease specific  instructions were discussed with the patient and/or caregiver at length.   As such, the patient has been evaluated and assessed, work-up was performed and treatment was provided in alignment with urgent care protocols and evidence based medicine.  Patient/parent/caregiver has been advised that the patient may require follow up for further testing and treatment if the symptoms continue in spite of treatment, as clinically indicated and appropriate.  If the patient was tested for COVID-19, Influenza and/or RSV, then the patient/parent/guardian was advised to isolate at home pending the results of his/her diagnostic coronavirus test and potentially longer if they're positive. I have also advised pt that if his/her COVID-19 test returns positive, it's recommended to self-isolate for at least 10 days after symptoms first appeared AND until fever-free for 24 hours without fever reducer AND other symptoms have improved or resolved. Discussed self-isolation recommendations as well as instructions for household member/close contacts as per the [facility] and [place] DHHS, and also gave patient the [facility] packet with this information.  Patient/parent/caregiver has been advised to return to the [facility] or PCP in 3-5 days if no better; to PCP or the Emergency Department if new signs and symptoms develop, or if the current signs or symptoms continue to change or worsen for further workup, evaluation and treatment as clinically indicated and appropriate  The patient will follow up with their current PCP if and as advised. If the patient does not currently have a PCP we will assist them in obtaining one.   The patient may need specialty follow up if the symptoms continue, in spite of conservative treatment and management, for further workup, evaluation, consultation and treatment as clinically indicated and appropriate.  Patient/parent/caregiver verbalized understanding and agreement of plan as discussed.  All questions were addressed  during visit.  Please see discharge instructions below for further details of plan.  Discharge Instructions:   Discharge Instructions      Your symptoms and my physical exam findings are concerning for exacerbation of your underlying allergies.     Please see the list below for recommended medications, dosages and frequencies to provide relief of current symptoms:     Zyrtec (cetirizine): This is an excellent second-generation antihistamine that helps to reduce respiratory inflammatory response to environmental allergens.  In some patients, this medication can cause daytime sleepiness so I recommend that you take 1 tablet daily at bedtime.     Pataday (olopatadine): This is an antihistamine eyedrop that can be used once daily to help relieve dry eyes, itchy eyes and red eyes.  This antihistamine drop not only works for allergic conjunctivitis but is also very helpful with viral conjunctivitis.  Please do not use this drop more than once a day, for best relief please use this in the morning.     If your insurance will not cover your allergy medications, please consider downloading the [employer] app which is free.  You can  find considerable discounts on prescription and over-the-counter medications.   If you find that you have not had improvement of your symptoms in the next 5 to 7 days, please follow-up with your primary care provider or return here to urgent care for repeat evaluation and further recommendations.   Thank you for visiting urgent care today.  We appreciate the opportunity to participate in your care.       This office note has been dictated using Museum/gallery curator.  Unfortunately, and despite my best efforts, this method of dictation can sometimes lead to occasional typographical or grammatical errors.  I apologize in advance if this occurs.     Lynden Oxford Scales, PA-C 11/15/21 1816

## 2021-11-16 ENCOUNTER — Encounter: Payer: Self-pay | Admitting: Family Medicine

## 2021-11-16 ENCOUNTER — Other Ambulatory Visit (INDEPENDENT_AMBULATORY_CARE_PROVIDER_SITE_OTHER): Payer: No Typology Code available for payment source

## 2021-11-16 ENCOUNTER — Encounter: Payer: Self-pay | Admitting: Medical

## 2021-11-16 ENCOUNTER — Telehealth: Payer: Self-pay | Admitting: Family Medicine

## 2021-11-16 ENCOUNTER — Telehealth (INDEPENDENT_AMBULATORY_CARE_PROVIDER_SITE_OTHER): Payer: No Typology Code available for payment source | Admitting: Family Medicine

## 2021-11-16 ENCOUNTER — Encounter: Payer: Self-pay | Admitting: *Deleted

## 2021-11-16 ENCOUNTER — Encounter: Payer: Self-pay | Admitting: Hematology & Oncology

## 2021-11-16 VITALS — Wt 260.0 lb

## 2021-11-16 DIAGNOSIS — H5789 Other specified disorders of eye and adnexa: Secondary | ICD-10-CM | POA: Diagnosis not present

## 2021-11-16 DIAGNOSIS — E669 Obesity, unspecified: Secondary | ICD-10-CM | POA: Diagnosis not present

## 2021-11-16 DIAGNOSIS — M2550 Pain in unspecified joint: Secondary | ICD-10-CM

## 2021-11-16 DIAGNOSIS — G8929 Other chronic pain: Secondary | ICD-10-CM | POA: Diagnosis not present

## 2021-11-16 DIAGNOSIS — R739 Hyperglycemia, unspecified: Secondary | ICD-10-CM | POA: Diagnosis not present

## 2021-11-16 LAB — IBC PANEL
Iron: 195 ug/dL — ABNORMAL HIGH (ref 42–145)
Saturation Ratios: 48 % (ref 20.0–50.0)
TIBC: 406 ug/dL (ref 250.0–450.0)
Transferrin: 290 mg/dL (ref 212.0–360.0)

## 2021-11-16 LAB — ANTI-DNA ANTIBODY, DOUBLE-STRANDED: ds DNA Ab: <1 IU/mL (ref 0–9)

## 2021-11-16 LAB — VITAMIN D 25 HYDROXY (VIT D DEFICIENCY, FRACTURES): VITD: 29.98 ng/mL — ABNORMAL LOW (ref 30.00–100.00)

## 2021-11-16 LAB — VITAMIN B12: Vitamin B-12: 285 pg/mL (ref 211–911)

## 2021-11-16 LAB — TSH: TSH: 1.25 u[IU]/mL (ref 0.35–5.50)

## 2021-11-16 MED ORDER — ERYTHROMYCIN 5 MG/GM OP OINT: TOPICAL_OINTMENT | OPHTHALMIC | 0 refills | Status: DC

## 2021-11-16 NOTE — Telephone Encounter (Signed)
Spoke with patient and got her to do an virtual visit today.

## 2021-11-16 NOTE — Patient Instructions (Addendum)
-I sent the medication(s) we discussed to your pharmacy: Meds ordered this encounter  Medications   erythromycin ophthalmic ointment    Sig: 1cm to affected eye (in bottom eyelid) 1-3 times daily for 5-7 days.    Dispense:  3.5 g    Refill:  0     I hope you are feeling better soon!  Seek in person care promptly if your symptoms worsen, new concerns arise or you are not improving with treatment.  It was nice to meet you today. I help [facility] out with telemedicine visits on Tuesdays and Thursdays and am happy to help if you need a virtual follow up visit on those days. Otherwise, if you have any concerns or questions following this visit please schedule a follow up visit with your Primary Care office or seek care at a local urgent care clinic to avoid delays in care. If you are having severe or life threatening symptoms please call 911 and/or go to the nearest emergency room.     Short guide for a healthy lifestyle: 1) Make sure to get regular (at least 3 per day), healthy meals and small healthy snacks if needed.  2) Eat a healthy clean diet (the Mediterranean diet is one good option).   TRY TO EAT: -at least 5-7 servings of low sugar, colorful, and nutrient rich vegetables per day (not corn, potatoes or bananas.) -berries are the best choice if you wish to eat fruit (only eat small amounts of fruit if trying to reduce weight)  -lean meets (low mercury fish, white meat of chicken or [place] - preferably organic and pastured if possible) -vegan proteins -small portions of beans or tofu, whole grains, nuts and seeds -Replace bad fats with good fats - good fats include: fish, nuts and seeds, olive oil, avocado oil -small amounts of low fat or non fat dairy ok - but avoid all together if possible -small amounts of 100 % whole grains - check the labels and make sure the first ingredient contains the word "whole" -drink plenty of water  AVOID: -SUGAR, sweets, anything with added sugar,  corn syrup or sweeteners - must read labels as even foods advertised as "healthy" often are loaded with sugar -if you must have a sweetener, small amounts of stevia may be best -sweetened beverages and artificially sweetened beverages -simple starches (rice, bread, potatoes, pasta, chips, etc - small amounts of 100% whole grains are ok) -red meat, pork, butter -fried foods, fast food, processed food, restaurant food, excessive dairy, eggs and coconut. -if you must eat out consider a bowl from [employer] or [employer] with brown rice if any grains, lots of veggies and legumes, veggies or chicken for protein. Do not eat the bread. Ask for an olive oil based dressing that does not contain sweetener.   3)Get at least 150 minutes of sweaty aerobic exercise per week.  4)Reduce stress - consider counseling, meditation and relaxation to balance other aspects of your life.  5)Be intentional about scheduling fun things to do with others at least 1-2 times per week (walking with others is a great way to kill two birds with one stone - check out "Walk with a doc" if it is something that would fit in your schedule.) Healthy social connections are very important for overall health.   6)Restorative sleep is important for good health. If you have poor sleep consider scheduling an appointment with a psychologist or counselor to address sleep issues. Also, talk with your doctor if you snore  or have any concerns for sleep apnea.

## 2021-11-16 NOTE — Progress Notes (Signed)
Virtual Visit via Video Note  I connected with Jodi Norman  on 11/16/21 at  3:00 PM EDT by a video enabled telemedicine application and verified that I am speaking with the correct person using two identifiers.  Location patient: [place] Location provider:work or home office Persons participating in the virtual visit: patient, provider  I discussed the limitations and requested verbal permission for telemedicine visit. The patient expressed understanding and agreed to proceed.   HPI:  Acute telemedicine visit for R eye "pinkeye": -Onset:yesterday -she went to urgent care - reports they did not look in her eye or examine her, "brushed it off" and gave her an allergy drop -reports daughter has pink eye as well -Symptoms include: R eye is irritated, some crusting on the eyelashes, sandy irritated feeling, eyelid feels a little puffy -she has had a runny nose and cough for about 3 days -Denies: fever, purulent discharge, vision loss, pain -Pertinent past medical history: see below -Pertinent medication allergies:No Known Allergies -COVID-19 vaccine status:  Immunization History  Administered Date(s) Administered   DTaP 01/08/2001   DTaP / HiB 06/10/1996, 08/19/1996, 11/04/1996, 07/27/1998   Hepatitis A, Ped/Adol-2 Dose 10/23/2007, 05/03/2008   Hepatitis B, ped/adol 12-13-95, 05/13/1996, 08/19/1996   IPV 01/08/2001   Influenza, Seasonal, Injecte, Preservative Fre 02/25/2018   MMR 06/18/1997, 11/13/1999   Meningococcal Conjugate 10/23/2007   OPV 06/10/1996, 08/19/1996, 11/04/1996, 07/27/1998   PPD Test 01/24/2021   Tdap 02/01/2007, 03/03/2018   Varicella 07/27/1998, 01/14/2006   We got into conversation about her chronic health challenges. She has chronic pain (arthralgias) and has seen many doctors and recently had a number of labs with her PCP. She asked me to look over the labs with her. Went through results with her. She was concerned about the Glu - 102 fasting. Has family member with  diabetes. Seeing nutritionist and preparing for wight loss surgery.  She is frustrated with the lack of a diagnoses and with no treatment for the chronic pain. She reports has seen a number of specialist and they are stumped.   ROS: See pertinent positives and negatives per HPI.  Past Medical History:  Diagnosis Date   Generalized anxiety disorder 06/24/2020   GERD (gastroesophageal reflux disease)     Past Surgical History:  Procedure Laterality Date   CESAREAN SECTION     MOUTH SURGERY  05/2021   UPPER GI ENDOSCOPY  04/27/2021     Current Outpatient Medications:    Biotin 10000 MCG TABS, Take 10,000 mcg by mouth., Disp: , Rfl:    celecoxib (CELEBREX) 100 MG capsule, Take 1 capsule (100 mg total) by mouth 2 (two) times daily., Disp: 60 capsule, Rfl: 3   cetirizine (ZYRTEC ALLERGY) 10 MG tablet, Take 1 tablet (10 mg total) by mouth at bedtime., Disp: 90 tablet, Rfl: 1   ferrous sulfate 325 (65 FE) MG tablet, TAKE 325 MG BY MOUTH DAILY., Disp: 90 tablet, Rfl: 0   folic acid (FOLVITE) 027 MCG tablet, Take 800 mcg by mouth daily., Disp: , Rfl:    gabapentin (NEURONTIN) 100 MG capsule, Take 1 capsule every night, Disp: 30 capsule, Rfl: 6   ibuprofen (ADVIL) 800 MG tablet, Take 1 tablet (800 mg total) by mouth every 6 (six) hours as needed for moderate pain, headache or cramping., Disp: 30 tablet, Rfl: 0   Olopatadine HCl (PATADAY) 0.2 % SOLN, Apply 1 drop to eye daily., Disp: 2.5 mL, Rfl: 1   omeprazole (PRILOSEC) 40 MG capsule, Take 1 capsule (40 mg total) by mouth  2 (two) times daily., Disp: 90 capsule, Rfl: 3   traMADol-acetaminophen (ULTRACET) 37.5-325 MG tablet, Take 1 tablet by mouth every 6 (six) hours as needed., Disp: 30 tablet, Rfl: 0   Vitamin D, Ergocalciferol, (DRISDOL) 1.25 MG (50000 UNIT) CAPS capsule, Take 1 capsule (50,000 Units total) by mouth every 7 (seven) days., Disp: 12 capsule, Rfl: 1  EXAM:  VITALS per patient if applicable:  GENERAL: alert, oriented, appears  well and in no acute distress  HEENT: atraumatic, conjunttiva with mild erythema on the R, EOMI, PER, no obvious abnormalities on inspection of external nose and ears  NECK: normal movements of the head and neck  LUNGS: on inspection no signs of respiratory distress, breathing rate appears normal, no obvious gross SOB, gasping or wheezing  CV: no obvious cyanosis  MS: moves all visible extremities without noticeable abnormality  PSYCH/NEURO: pleasant and cooperative, no obvious depression or anxiety, speech and thought processing grossly intact  ASSESSMENT AND PLAN:  Discussed the following assessment and plan:  Eye irritation  Other chronic pain  Obesity  Blood glucose elevated  -we discussed possible serious and likely etiologies, options for evaluation and workup, limitations of telemedicine visit vs in person visit, treatment, treatment risks and precautions. Pt is agreeable to treatment via telemedicine at this moment. For the eye issue suspect viral conjunctivitis and advised trial of cool compresses and artificial tears. She feels this can be pink eye and has had good response to abx in the past. Discussed risks and sent erythro ointment rx to use if purulent discharge, worsening or not resolving with other measures. -talked at length about a healthy lifestyle, particularly diet, for preventing diabetes and for optimal health. Suggested could try functional medicine evaluation. Would recommend a healthy whole foods plant heavy diet with avoidance of processed foods, refined grains, sweetened foods and red meat - summarized in patient instructions. She is seeing nutritionist. Did advise she follow up with PCP about labs as well.   Advise to seek prompt virtual visit or in person care if worsening, new symptoms arise, or if is not improving with treatment as expected per our conversation of expected course.    I discussed the assessment and treatment plan with the patient. The  patient was provided an opportunity to ask questions and all were answered. > 30 minutes spent on this visit. The patient agreed with the plan and demonstrated an understanding of the instructions.     [name] Kern, DO

## 2021-11-16 NOTE — Telephone Encounter (Signed)
Spoke with patient. Pt states she thinks she has pink eye. I mentioned to the patient that I see she went to the UC yesterday and the provider there rx two medications. Zyrtec and eye drops. Pt states she was told that her insurance would not cover the medication. Pt also states that the UC provider "didn't even touch me" and "my eyes were not looked at all". Pt was advised that [facility] was out of the office until next week and we were booked for the rest of the week. I offered a appointment for next week and patient stated "It will be gone by then". Pt also wanted me to go over her labs from Dr. Gustavo Lah office and I advised I was unable to. Pt stated that [facility] goes over labs with her when she is there and I advised that is allowed too but I can't. I advised the patient that I didn't see any labs from Dr. Gustavo Lah office at this time and she would need to call them.

## 2021-11-16 NOTE — Telephone Encounter (Signed)
Patient would like to know if Dr. Etter Sjogren could send eye drops to her pharmacy since she believe she has pink eye. Please advise.

## 2021-11-18 LAB — HEXAGONAL PHASE PHOSPHOLIPID: Hexagonal Phase Phospholipid: 6 s (ref 0–11)

## 2021-11-18 LAB — LUPUS ANTICOAGULANT PANEL
Dilute Viper Venom Time: 46.4 s (ref 0.0–47.0)
PTT Lupus Anticoagulant: 46 s — ABNORMAL HIGH (ref 0.0–43.5)

## 2021-11-18 LAB — PTT-LA MIX: PTT-LA Mix: 43 s — ABNORMAL HIGH (ref 0.0–40.5)

## 2021-11-20 LAB — ANTI-NUCLEAR AB-TITER (ANA TITER): ANA Titer 1: 1:40 {titer} — ABNORMAL HIGH

## 2021-11-20 LAB — ANA: Anti Nuclear Antibody (ANA): POSITIVE — AB

## 2021-11-20 LAB — RHEUMATOID FACTOR: Rheumatoid fact SerPl-aCnc: <14 IU/mL (ref <14)

## 2021-11-22 NOTE — Telephone Encounter (Signed)
Was a referral to Rheumatology supposed to be placed?

## 2021-11-23 ENCOUNTER — Telehealth (INDEPENDENT_AMBULATORY_CARE_PROVIDER_SITE_OTHER): Payer: Self-pay | Admitting: Family Medicine

## 2021-11-23 ENCOUNTER — Other Ambulatory Visit: Payer: Self-pay

## 2021-11-23 ENCOUNTER — Encounter: Payer: Self-pay | Admitting: Family Medicine

## 2021-11-23 DIAGNOSIS — M791 Myalgia, unspecified site: Secondary | ICD-10-CM

## 2021-11-23 DIAGNOSIS — R768 Other specified abnormal immunological findings in serum: Secondary | ICD-10-CM

## 2021-11-23 NOTE — Telephone Encounter (Signed)
Referral placed.

## 2021-11-24 DIAGNOSIS — M791 Myalgia, unspecified site: Secondary | ICD-10-CM | POA: Insufficient documentation

## 2021-11-24 NOTE — Progress Notes (Signed)
No show

## 2021-11-27 ENCOUNTER — Encounter: Payer: Self-pay | Admitting: Family Medicine

## 2021-11-27 ENCOUNTER — Encounter (HOSPITAL_COMMUNITY)
Admission: RE | Admit: 2021-11-27 | Discharge: 2021-11-27 | Disposition: A | Discharge status: Home / Self Care | Payer: No Typology Code available for payment source | Source: Ambulatory Visit | Attending: Hematology & Oncology | Admitting: Hematology & Oncology

## 2021-11-27 ENCOUNTER — Telehealth (INDEPENDENT_AMBULATORY_CARE_PROVIDER_SITE_OTHER): Payer: No Typology Code available for payment source | Admitting: Family Medicine

## 2021-11-27 DIAGNOSIS — Z8719 Personal history of other diseases of the digestive system: Secondary | ICD-10-CM | POA: Diagnosis not present

## 2021-11-27 DIAGNOSIS — R400 Somnolence: Secondary | ICD-10-CM | POA: Diagnosis not present

## 2021-11-27 DIAGNOSIS — M2550 Pain in unspecified joint: Secondary | ICD-10-CM

## 2021-11-27 LAB — GLUCOSE, CAPILLARY: Glucose-Capillary: 101 mg/dL — ABNORMAL HIGH (ref 70–99)

## 2021-11-27 MED ORDER — FLUDEOXYGLUCOSE F - 18 (FDG) INJECTION
13.0000 | Freq: Once | INTRAVENOUS | Status: AC | PRN
  Administered 2021-11-27: 13.42 via INTRAVENOUS

## 2021-11-27 MED ORDER — OMEPRAZOLE 40 MG PO CPDR: 40.0000 mg | DELAYED_RELEASE_CAPSULE | Freq: Two times a day (BID) | ORAL | 3 refills | Status: DC

## 2021-11-27 NOTE — Assessment & Plan Note (Signed)
Waiting for sleep study to be scheduled

## 2021-11-27 NOTE — Assessment & Plan Note (Signed)
Rheum referral pending

## 2021-11-27 NOTE — Progress Notes (Signed)
MyChart Video Visit    Virtual Visit via Video Note   This visit type was conducted due to national recommendations for restrictions regarding the COVID-19 Pandemic (e.g. social distancing) in an effort to limit this patient's exposure and mitigate transmission in our community. This patient is at least at moderate risk for complications without adequate follow up. This format is felt to be most appropriate for this patient at this time. Physical exam was limited by quality of the video and audio technology used for the visit. Alinda Dooms was able to get the patient set up on a video visit.  Patient location: Home Patient and provider in visit Provider location: Office  I discussed the limitations of evaluation and management by telemedicine and the availability of in person appointments. The patient expressed understanding and agreed to proceed.  Visit Date: 11/27/2021  Today's healthcare provider: Ann Held, DO     Subjective:    Patient ID: Jodi Norman, female    DOB: 12/02/1995, 26 y.o.   MRN: [id number]  Chief Complaint  Patient presents with   Referral    HPI Patient is in today for a virtual office visit.  She states that her prescription of 40 Mg of Omeprazole has not arrived yet. She also states that her heartburn is worsening.   She reports of worsening anxiety due to problems with sleep. She is having trouble sleeping and reports waking up in the middle of the night. Her mother noted that the patient snores in her sleep and also stops breathing in her sleep. As a result, the patient is requesting a sleep study. She states that she's been asking for about a year now. She also states that her pulmonologist tried to schedule a sleep study for her. She reports that she has not received any information about scheduling a sleep study.   She is inquiring about whether it's okay to take Diuretics because of elevated swelling.   She is requesting a  review of her lab results on 11/15/2021 and 11/16/2021. She is inquiring about her elevating iron levels. She currently takes one tablet of iron supplements daily. Her Vitamin D levels are low and she is planning to take Vitamin D and D3 supplements.   She is also requesting to see whether she qualifies for a handicap sticker. She states that she no longer has Medicaid.   Past Medical History:  Diagnosis Date   Generalized anxiety disorder 06/24/2020   GERD (gastroesophageal reflux disease)     Past Surgical History:  Procedure Laterality Date   CESAREAN SECTION     MOUTH SURGERY  05/2021   UPPER GI ENDOSCOPY  04/27/2021    Family History  Problem Relation Age of Onset   Hyperlipidemia Mother    Hypertension Mother    Hyperlipidemia Father    Hypertension Father    Diabetes Father    Colon cancer Neg Hx    Stomach cancer Neg Hx    Esophageal cancer Neg Hx    Pancreatic cancer Neg Hx    Colon polyps Neg Hx    Rectal cancer Neg Hx     Social History   Socioeconomic History   Marital status: Single    Spouse name: Not on file   Number of children: Not on file   Years of education: Not on file   Highest education level: Not on file  Occupational History   Occupation: Psychologist, occupational: [employer]  Tobacco  Use   Smoking status: Never   Smokeless tobacco: Never  Vaping Use   Vaping Use: Never used  Substance and Sexual Activity   Alcohol use: Never   Drug use: Never   Sexual activity: Not Currently    Birth control/protection: None  Other Topics Concern   Not on file  Social History Narrative   Right handed   Social Determinants of Health   Financial Resource Strain: Not on file  Food Insecurity: Not on file  Transportation Needs: Not on file  Physical Activity: Not on file  Stress: Not on file  Social Connections: Not on file  Intimate Partner Violence: Not on file    Outpatient Medications Prior to Visit  Medication Sig Dispense Refill    Biotin 10000 MCG TABS Take 10,000 mcg by mouth.     celecoxib (CELEBREX) 100 MG capsule Take 1 capsule (100 mg total) by mouth 2 (two) times daily. 60 capsule 3   cetirizine (ZYRTEC ALLERGY) 10 MG tablet Take 1 tablet (10 mg total) by mouth at bedtime. 90 tablet 1   erythromycin ophthalmic ointment 1cm to affected eye (in bottom eyelid) 1-3 times daily for 5-7 days. 3.5 g 0   ferrous sulfate 325 (65 FE) MG tablet TAKE 325 MG BY MOUTH DAILY. 90 tablet 0   folic acid (FOLVITE) 563 MCG tablet Take 800 mcg by mouth daily.     gabapentin (NEURONTIN) 100 MG capsule Take 1 capsule every night 30 capsule 6   ibuprofen (ADVIL) 800 MG tablet Take 1 tablet (800 mg total) by mouth every 6 (six) hours as needed for moderate pain, headache or cramping. 30 tablet 0   Olopatadine HCl (PATADAY) 0.2 % SOLN Apply 1 drop to eye daily. 2.5 mL 1   traMADol-acetaminophen (ULTRACET) 37.5-325 MG tablet Take 1 tablet by mouth every 6 (six) hours as needed. 30 tablet 0   Vitamin D, Ergocalciferol, (DRISDOL) 1.25 MG (50000 UNIT) CAPS capsule Take 1 capsule (50,000 Units total) by mouth every 7 (seven) days. 12 capsule 1   omeprazole (PRILOSEC) 40 MG capsule Take 1 capsule (40 mg total) by mouth 2 (two) times daily. 90 capsule 3   No facility-administered medications prior to visit.    No Known Allergies  Review of Systems  Constitutional:  Negative for fever and malaise/fatigue.  HENT:  Negative for congestion.   Eyes:  Negative for blurred vision.  Respiratory:  Negative for cough and shortness of breath.   Cardiovascular:  Negative for chest pain, palpitations and leg swelling.  Gastrointestinal:  Negative for vomiting.  Musculoskeletal:  Positive for joint pain and myalgias. Negative for back pain.  Skin:  Negative for rash.  Neurological:  Negative for loss of consciousness and headaches.       Objective:    Physical Exam  LMP 10/28/2021 (Approximate)  Wt Readings from Last 3 Encounters:  11/16/21  260 lb (117.9 kg)  11/15/21 260 lb 0.6 oz (118 kg)  11/14/21 262 lb (118.8 kg)    Diabetic Foot Exam - Simple   No data filed    Lab Results  Component Value Date   WBC 6.5 11/15/2021   HGB 11.2 (L) 11/15/2021   HCT 34.6 (L) 11/15/2021   PLT 250 11/15/2021   GLUCOSE 102 (H) 11/15/2021   CHOL 135 08/08/2021   TRIG 148.0 08/08/2021   HDL 32.10 (L) 08/08/2021   LDLCALC 73 08/08/2021   ALT 12 11/15/2021   AST 13 (L) 11/15/2021   NA 138 11/15/2021  K 4.1 11/15/2021   CL 103 11/15/2021   CREATININE 0.74 11/15/2021   BUN 10 11/15/2021   CO2 26 11/15/2021   TSH 1.25 11/16/2021   INR 1.0 04/27/2021   HGBA1C 5.6 07/11/2020    Lab Results  Component Value Date   TSH 1.25 11/16/2021   Lab Results  Component Value Date   WBC 6.5 11/15/2021   HGB 11.2 (L) 11/15/2021   HCT 34.6 (L) 11/15/2021   MCV 80.8 11/15/2021   PLT 250 11/15/2021   Lab Results  Component Value Date   NA 138 11/15/2021   K 4.1 11/15/2021   CO2 26 11/15/2021   GLUCOSE 102 (H) 11/15/2021   BUN 10 11/15/2021   CREATININE 0.74 11/15/2021   BILITOT 0.3 11/15/2021   ALKPHOS 88 11/15/2021   AST 13 (L) 11/15/2021   ALT 12 11/15/2021   PROT 8.0 11/15/2021   ALBUMIN 4.4 11/15/2021   CALCIUM 9.5 11/15/2021   ANIONGAP 9 11/15/2021   GFR 122.90 08/08/2021   Lab Results  Component Value Date   CHOL 135 08/08/2021   Lab Results  Component Value Date   HDL 32.10 (L) 08/08/2021   Lab Results  Component Value Date   LDLCALC 73 08/08/2021   Lab Results  Component Value Date   TRIG 148.0 08/08/2021   Lab Results  Component Value Date   CHOLHDL 4 08/08/2021   Lab Results  Component Value Date   HGBA1C 5.6 07/11/2020       Assessment & Plan:   Problem List Items Addressed This Visit       Unprioritized   Daytime somnolence    Waiting for sleep study to be scheduled       Arthralgia    Rheum referral pending       Other Visit Diagnoses     History of gastroesophageal reflux  (GERD)       Relevant Medications   omeprazole (PRILOSEC) 40 MG capsule        Meds ordered this encounter  Medications   omeprazole (PRILOSEC) 40 MG capsule    Sig: Take 1 capsule (40 mg total) by mouth 2 (two) times daily.    Dispense:  90 capsule    Refill:  3    I discussed the assessment and treatment plan with the patient. The patient was provided an opportunity to ask questions and all were answered. The patient agreed with the plan and demonstrated an understanding of the instructions.   The patient was advised to call back or seek an in-person evaluation if the symptoms worsen or if the condition fails to improve as anticipated.  I provided 20 minutes of face-to-face time during this encounter.   I,Amber Collins,acting as a Education administrator for [employer], DO.,have documented all relevant documentation on the behalf of Ann Held, DO,as directed by  Ann Held, DO while in the presence of Ann Held, DO.   Ann Held, DO [place] [facility] [employer] [facility] [phone] (phone) [phone] (fax)  [place]

## 2021-11-29 ENCOUNTER — Other Ambulatory Visit: Payer: Self-pay

## 2021-11-29 ENCOUNTER — Telehealth: Payer: Self-pay | Admitting: Family Medicine

## 2021-11-29 MED ORDER — VITAMIN D (ERGOCALCIFEROL) 1.25 MG (50000 UNIT) PO CAPS: 50000.0000 [IU] | ORAL_CAPSULE | ORAL | 1 refills | Status: DC

## 2021-11-29 NOTE — Telephone Encounter (Signed)
Pt stated the rheumatology referral has too long of a wait and she would like to see if there is anywhere else that can see her sooner.

## 2021-11-30 ENCOUNTER — Encounter: Payer: No Typology Code available for payment source | Attending: Family Medicine | Admitting: Dietician

## 2021-11-30 ENCOUNTER — Encounter: Payer: Self-pay | Admitting: Dietician

## 2021-11-30 DIAGNOSIS — Z6841 Body Mass Index (BMI) 40.0 and over, adult: Secondary | ICD-10-CM | POA: Diagnosis not present

## 2021-11-30 DIAGNOSIS — Z713 Dietary counseling and surveillance: Secondary | ICD-10-CM | POA: Insufficient documentation

## 2021-11-30 NOTE — Progress Notes (Addendum)
Supervised Weight Loss Visit Bariatric Nutrition Education  3 out of 6 SWL Appointments   Planned surgery: RYGB    NUTRITION ASSESSMENT   Anthropometrics  Start weight at [facility]: 258.9 lbs (date: 09/21/2021)  Height: 66 in Weight: 262.5 pounds BMI: 42.37 kg/m2     Clinical  Medical hx: GERD, anxiety Medications: vitamin D, iron, B12, omeprazole; biotin, iron, fenugreek, collegan, fish oil, foic acid, vimtain C, vimtain D Labs: Vit D 29.98; HDL 32.10, Ferritin 8.5, hemoglobin 11.2, HCT 34.6;  RDW 16.7, calcium Notable signs/symptoms: headaches, fatigue, joint pain, back pain, low energy, knee pain Any previous deficiencies? YES: b12, vitamin D  Lifestyle & Dietary Hx  Pt arrived appearing to be cheerful and receptive. Pt states she has an autoimmune disease which patient states causes her to retain fluids. Pt states she has been eating more beans, and continuing to eat seafood to get protein. Pt states she has been juicing for the past few weeks, stating she gets 16 ounces a day. Pt states she is using the pulp from the juicing in her meals, to get her fiber in. Pt states she is not hungry in the evening. Pt states she does intermittent fasting, eating two meals. Dietitian explained that after surgery she will need to eat throughout the day, and advised pt to begin now to make the habit of eating every 3-5 hours during the day. Pt stated she will try. Pt questioned the amount of food she could eat after surgery, stating I thought only the sleeve had to eat throughout the day.  Dietitian created a drawing of the RYGB on a piece of paper to illustrate the by-pass surgery and showed an illustration on the Internet. Pt showed understanding.  Estimated daily fluid intake:  oz Supplements: vitamin D, iron, B12,  biotin, iron, fenugreek, collegan, fish oil, foic acid, vimtain C, vitamin D Current average weekly physical activity: ADL's  24-Hr Dietary Recall First Meal: Overnight oats  with fruit, or fruit, or [employer]ita biscuit/cookie Snack: popcorn Second Meal 2-3: tuna or salad or eat out at [employer] or [employer] Snack: granola bar and celery and hummus or cucumber + ranch seasoning + lime or tomato ad cucumber in rice vinegar   Third Meal: skip because no appetite and intermittent fasting  Snack:  Beverages: juice, water, carbonated water   NUTRITION DIAGNOSIS  Overweight/obesity ([place]-3.3) related to past poor dietary habits and physical inactivity as evidenced by patient w/ planned RYGB surgery following dietary guidelines for continued weight loss.   NUTRITION INTERVENTION  Nutrition counseling (C-1) and education (E-2) to facilitate bariatric surgery goals.  Pre-Op Goals Progress & New Goals Continue:  eat 3 meals a day and snacks like fruit or non-starchy vegetable paired with a protein like peanut butter or humus.  Continue: eat tuna/salmon/shrimp or beans at meals and not rely on protein shake to meet protein needs.  Handouts Provided Include    Learning Style & Readiness for Change Teaching method utilized: Visual & Auditory  Demonstrated degree of understanding via: Teach Back  Readiness Level: contemplative Barriers to learning/adherence to lifestyle change: single mother  RD's Notes for next Visit  Assess adherence to chosen goals   MONITORING & EVALUATION Dietary intake, weekly physical activity, body weight, and pre-op goals in 1 month.   Next Steps  Patient is to return to [facility] in 1 month

## 2021-12-04 ENCOUNTER — Encounter: Payer: Self-pay | Admitting: Neurology

## 2021-12-04 ENCOUNTER — Ambulatory Visit: Payer: No Typology Code available for payment source | Admitting: Neurology

## 2021-12-04 DIAGNOSIS — Z029 Encounter for administrative examinations, unspecified: Secondary | ICD-10-CM

## 2021-12-05 ENCOUNTER — Emergency Department (HOSPITAL_BASED_OUTPATIENT_CLINIC_OR_DEPARTMENT_OTHER)
Admission: EM | Admit: 2021-12-05 | Discharge: 2021-12-05 | Disposition: A | Discharge status: Home / Self Care | Payer: No Typology Code available for payment source | Attending: Emergency Medicine | Admitting: Emergency Medicine

## 2021-12-05 ENCOUNTER — Encounter (HOSPITAL_BASED_OUTPATIENT_CLINIC_OR_DEPARTMENT_OTHER): Payer: Self-pay | Admitting: Emergency Medicine

## 2021-12-05 ENCOUNTER — Emergency Department (HOSPITAL_BASED_OUTPATIENT_CLINIC_OR_DEPARTMENT_OTHER): Payer: No Typology Code available for payment source

## 2021-12-05 ENCOUNTER — Other Ambulatory Visit: Payer: Self-pay

## 2021-12-05 DIAGNOSIS — M7652 Patellar tendinitis, left knee: Secondary | ICD-10-CM | POA: Diagnosis not present

## 2021-12-05 DIAGNOSIS — M79605 Pain in left leg: Secondary | ICD-10-CM | POA: Diagnosis present

## 2021-12-05 DIAGNOSIS — M779 Enthesopathy, unspecified: Secondary | ICD-10-CM

## 2021-12-05 MED ORDER — IBUPROFEN 800 MG PO TABS
800.0000 mg | ORAL_TABLET | Freq: Once | ORAL | Status: AC
  Administered 2021-12-05: 800 mg via ORAL
  Filled 2021-12-05: qty 1

## 2021-12-05 MED ORDER — IBUPROFEN 800 MG PO TABS: 800.0000 mg | ORAL_TABLET | Freq: Four times a day (QID) | ORAL | 0 refills | Status: DC | PRN

## 2021-12-05 NOTE — Discharge Instructions (Addendum)
Ice the area that is giving you pain.  Also try and keep your legs elevated when sitting on a desk.  You should also wear more supportive shoes when you do go on walks with your dog.  Take ibuprofen and Tylenol for your discomfort. Follow-up with your primary care provider or orthopedic provider if you continue to have discomfort.  Return to the emergency department with any worsening pain or loss of sensation.

## 2021-12-05 NOTE — ED Provider Notes (Signed)
[facility] EMERGENCY DEPARTMENT Provider Note   CSN: [id number] Arrival date & time: 12/05/21  4098     History  Chief Complaint  Patient presents with   Leg Pain    Jodi Norman is a 26 y.o. female with a past medical history of anxiety, daytime fatigue, obesity and metabolic syndrome presenting today with complaint of left leg pain.  Says been going on for almost a week.  Localizes it to the anterior shin.  No falls or injuries.  No new shoes.  Took ibuprofen last night which helped her get some sleep.  Otherwise no medications.  No previous injury.   Leg Pain      Home Medications Prior to Admission medications   Medication Sig Start Date End Date Taking? Authorizing Provider  Biotin 10000 MCG TABS Take 10,000 mcg by mouth.    [provider]  celecoxib (CELEBREX) 100 MG capsule Take 1 capsule (100 mg total) by mouth 2 (two) times daily. 11/15/21   Volanda Napoleon, MD  cetirizine (ZYRTEC ALLERGY) 10 MG tablet Take 1 tablet (10 mg total) by mouth at bedtime. 11/15/21 05/14/22  Lynden Oxford Scales, PA-C  erythromycin ophthalmic ointment 1cm to affected eye (in bottom eyelid) 1-3 times daily for 5-7 days. 11/16/21   [name] Kern, DO  ferrous sulfate 325 (65 FE) MG tablet TAKE 325 MG BY MOUTH DAILY. 09/15/21   Saguier, Percell Miller, PA-C  folic acid (FOLVITE) 119 MCG tablet Take 800 mcg by mouth daily.    [provider]  gabapentin (NEURONTIN) 100 MG capsule Take 1 capsule every night 08/29/21   Cameron Sprang, MD  ibuprofen (ADVIL) 800 MG tablet Take 1 tablet (800 mg total) by mouth every 6 (six) hours as needed for moderate pain, headache or cramping. 12/05/21   Lylith Bebeau A, PA-C  Olopatadine HCl (PATADAY) 0.2 % SOLN Apply 1 drop to eye daily. 11/15/21   Lynden Oxford Scales, PA-C  omeprazole (PRILOSEC) 40 MG capsule Take 1 capsule (40 mg total) by mouth 2 (two) times daily. 11/27/21   Ann Held, DO  traMADol-acetaminophen (ULTRACET)  37.5-325 MG tablet Take 1 tablet by mouth every 6 (six) hours as needed. 11/14/21   Ann Held, DO  Vitamin D, Ergocalciferol, (DRISDOL) 1.25 MG (50000 UNIT) CAPS capsule Take 1 capsule (50,000 Units total) by mouth every 7 (seven) days. 11/29/21   Ann Held, DO      Allergies    Patient has no known allergies.    Review of Systems   Review of Systems  Physical Exam Updated Vital Signs BP 124/66 (BP Location: Right Arm)   Pulse 84   Temp 98.5 F (36.9 C) (Oral)   Resp 20   Ht '5\' 6"'$  (1.676 m)   Wt 119.1 kg   LMP 10/28/2021 (Approximate)   SpO2 99%   BMI 42.37 kg/m  Physical Exam Vitals and nursing note reviewed.  Constitutional:      Appearance: Normal appearance.  HENT:     Head: Normocephalic and atraumatic.  Eyes:     General: No scleral icterus.    Conjunctiva/sclera: Conjunctivae normal.  Pulmonary:     Effort: Pulmonary effort is normal. No respiratory distress.  Musculoskeletal:     Comments: Tenderness over the insertion of the quadriceps tendon into the lower extremity.  No appreciable pitting edema bilaterally.  Strong pulses.  Skin:    Findings: No rash.  Neurological:     Mental Status: She  is alert.  Psychiatric:        Mood and Affect: Mood normal.     ED Results / Procedures / Treatments   Labs (all labs ordered are listed, but only abnormal results are displayed) Labs Reviewed - No data to display  EKG None  Radiology DG Tibia/Fibula Left  Result Date: 12/05/2021 CLINICAL DATA:  Worsening left shin pain for several days. EXAM: LEFT TIBIA AND FIBULA - 2 VIEW COMPARISON:  Left knee radiographs also obtained today FINDINGS: There is no evidence of fracture or other focal bone lesions. Soft tissues are unremarkable. IMPRESSION: Negative. Electronically Signed   By: Marlaine Hind M.D.   On: 12/05/2021 10:17   DG Knee Complete 4 Views Left  Result Date: 12/05/2021 CLINICAL DATA:  Worsening knee pain and swelling for several days.  EXAM: LEFT KNEE - COMPLETE 4+ VIEW COMPARISON:  None Available. FINDINGS: No evidence of fracture, dislocation, or joint effusion. No evidence of arthropathy or other focal bone abnormality. Soft tissues are unremarkable. IMPRESSION: Negative. Electronically Signed   By: Marlaine Hind M.D.   On: 12/05/2021 10:17    Procedures Procedures   Medications Ordered in ED Medications  ibuprofen (ADVIL) tablet 800 mg (has no administration in time range)    ED Course/ Medical Decision Making/ A&P                           Medical Decision Making Amount and/or Complexity of Data Reviewed Radiology: ordered.  Risk Prescription drug management.   26 year old female presenting today with left leg pain.  Atraumatic.  No history of heart failure.  Low likelihood Wells DVT score.  Differential includes but is not limited to tendinitis, tumor, osteomyelitis, fracture, myositis, medication reaction, ligamental injury.  Per chart review patient was found to be ANA positive last year.  Question immune system because of this.  Physical exam: Consistent with tendinitis/musculoskeletal pain  Treatment: Given ibuprofen  MDM/disposition: Patient presentation is most likely secondary to tendinitis of the patella.  Patient reports walking her dog longer distances and says that she wears flats and flip-flops the majority of the time.  I suspect that she needs more supportive shoes as well as rice therapy for tendinitis.  She will follow-up with her PCP if this continues or her orthopedist in [place].  She is agreeable to this plan.  She was discharged neurovascularly intact.  Final Clinical Impression(s) / ED Diagnoses Final diagnoses:  Left leg pain  Tendonitis    Rx / DC Orders ED Discharge Orders          Ordered    ibuprofen (ADVIL) 800 MG tablet  Every 6 hours PRN        12/05/21 1049           Results and diagnoses were explained to the patient. Return precautions discussed in full.  Patient had no additional questions and expressed complete understanding.   This chart was dictated using voice recognition software.  Despite best efforts to proofread,  errors can occur which can change the documentation meaning.    Darliss Ridgel 12/05/21 1103    Gareth Morgan, MD 12/06/21 [phone]

## 2021-12-05 NOTE — ED Triage Notes (Signed)
Left shin pain since this weekend. Progressively worsening.  No falls or injuries.  Pt having swelling in both ankles and knees.  No long trips.  No hormones.

## 2021-12-07 ENCOUNTER — Telehealth (INDEPENDENT_AMBULATORY_CARE_PROVIDER_SITE_OTHER): Payer: No Typology Code available for payment source | Admitting: Pulmonary Disease

## 2021-12-07 DIAGNOSIS — G4733 Obstructive sleep apnea (adult) (pediatric): Secondary | ICD-10-CM | POA: Diagnosis not present

## 2021-12-07 NOTE — Progress Notes (Signed)
I connected with  Jodi Norman on 12/08/21 byvideo enabled telemedicine application and verified that I am speaking with the correct person using two identifiers.     Location: Patient: Home Provider: Office - [facility] - [address], [address], [place], [place] [address]   I discussed the limitations of evaluation and management by telemedicine and the availability of in person appointments. The patient expressed understanding and agreed to proceed. I also discussed with the patient that there may be a patient responsible charge related to this service. The patient expressed understanding and agreed to proceed.   Patient consented to video visit: Yes People present and their role in pt care: Pt    26 yo never smoker for FU of sleep disordered breathing   She had a previous consultation 10/2020  for evaluation of bone lesions with suspicion of sarcoid. She presented in January 2022 with 2 weeks of sweats, loss of appetite, shortness of breath with sore throat.  She tested positive for COVID.  Chest x-ray was normal CT angiogram was performed which showed sclerotic bone lesions in vertebra, pelvis, clavicular heads and ribs.  PET scan confirmed hypermetabolism in these areas and subcutaneous nodules and in the pharynx and tonsils likely related to COVID.  She underwent bone marrow biopsy of her pelvic lesion which did not show any evidence of cancer but showed a rare noncaseating granuloma. When she saw dermatology she had no significant skin lesions to biopsy  RT [place] lymph node biopsy >>04/27/2021. No malignancy.   no granuloma   I reviewed oncology consultation report and repeat PET scan 11/2021.  Repeat bone biopsy spine  She is frustrated that she has not been able to obtain her sleep study yet.  There was difficulty obtaining insurance approval for the home sleep test.  Split-night study was ordered, insurance approval was obtained in late June but her Medicaid has ended 6/30  and she is now on a different insurance. She is concerned due to gasping episodes that have woken her up from sleep, she is sleeping in a chair and is very anxious about getting a diagnosis and starting on CPAP therapy.  She feels she would not be able to do a home test since she has a young child  Significant tests/ events reviewed  PET 11/2021 >> bone lesions showed reduced activity focal nodule along left face, right supraclavicular lymph node decreased SUV 3.8  CTA chest 05/2020 sclerotic changes involving the body of the sternum as well as sclerotic changes of T4 and T9 vertebra. Additional smaller sclerotic changes involving T7 and L1, clavicular heads and ribs.  Mildly enlarged lymph node in the lower neck to the right of the trachea   Pelvic bone biopsy 06/2020 rare noncaseating granuloma   PET 06/2020 diffuse sclerotic disease including pelvis, hypermetabolic.  Hypermetabolic, pharynx and tonsils, mildly hypermetabolic subcu nodules   PET scan 09/2020 no change in bone lesions , neck lymphadenopathy is resolved, hypermetabolic lesions in left and right cheek   ACE level normal ANA 180 homogeneous pattern    ROS - neg for any significant sore throat, dysphagia, itching, sneezing, nasal congestion or excess/ purulent secretions, fever, chills, sweats, unintended wt loss, pleuritic or exertional cp, hempoptysis, orthopnea pnd or change in chronic leg swelling. Also denies presyncope, palpitations, heartburn, abdominal pain, nausea, vomiting, diarrhea or change in bowel or urinary habits, dysuria,hematuria, rash, arthralgias, visual complaints, headache, numbness weakness or ataxia.   On exam or camera -no distress, no accessory muscle  use, able to speak in full sentences   Impression/plan  OSA -unfortunately her sleep study has been delayed due to insurance issues.  We finally obtain approval but her insurance does not change we will likely have to get approval from her new insurance  and schedule split-night study.  I have asked the sleep lab to expedite this and she was placed on the cancellation list. She does have a history which suggest high probability for OSA and will likely require CPAP therapy.  Supraclavicular lymph node has been biopsied and surprisingly did not show granulomas.  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  No indication to treat with steroids at this time.  She follows with oncology  Total encounter time was 21 minutes  Oaklen Thiam V. Elsworth Soho MD

## 2021-12-07 NOTE — Patient Instructions (Signed)
Try to expedite her study & confirm that it is under new insurance

## 2021-12-07 NOTE — Progress Notes (Deleted)
   Subjective:    Patient ID: Jodi Norman, female    DOB: [birth year], 26 y.o.   MRN: [id number]  HPI    Review of Systems     Objective:   Physical Exam        Assessment & Plan:

## 2021-12-19 ENCOUNTER — Encounter: Payer: Self-pay | Admitting: Family Medicine

## 2021-12-19 ENCOUNTER — Ambulatory Visit (INDEPENDENT_AMBULATORY_CARE_PROVIDER_SITE_OTHER): Payer: No Typology Code available for payment source | Admitting: Family Medicine

## 2021-12-19 VITALS — BP 119/78 | HR 84 | Temp 98.7°F | Resp 16 | Ht 67.0 in | Wt 263.4 lb

## 2021-12-19 DIAGNOSIS — K219 Gastro-esophageal reflux disease without esophagitis: Secondary | ICD-10-CM | POA: Diagnosis not present

## 2021-12-19 DIAGNOSIS — R600 Localized edema: Secondary | ICD-10-CM | POA: Diagnosis not present

## 2021-12-19 DIAGNOSIS — M791 Myalgia, unspecified site: Secondary | ICD-10-CM

## 2021-12-19 MED ORDER — OMEPRAZOLE 20 MG PO CPDR: 20.0000 mg | DELAYED_RELEASE_CAPSULE | Freq: Every day | ORAL | 3 refills | Status: DC

## 2021-12-19 MED ORDER — FUROSEMIDE 40 MG PO TABS: 40.0000 mg | ORAL_TABLET | Freq: Every day | ORAL | 3 refills | Status: DC

## 2021-12-19 NOTE — Progress Notes (Signed)
Subjective:   By signing my name below, I, Luiz Ochoa, attest that this documentation has been prepared under the direction and in the presence of Ann Held, DO  12/19/2021    Patient ID: Jodi Norman, female    DOB: [birth year], 26 y.o.   MRN: [id number]  Chief Complaint  Patient presents with   fmla paperwork    HPI Patient is in today for follow up visit.   She is requesting FMLA. At work, she says that she sits at a desk all day with only two 15 minute breaks and a 30 minute lunch.  She is also requesting to work from home, because of the severe pain in her lower extremities. To manage her discomfort, she sits on pillows at work. Her pain radiates from her left shin to the lumbar region of ger back. She describe her shin pain as if her leg bone is fractured. She is experiencing edema in her left leg as well.. She no longer takes the omeprazole because it causes her to have headaches, and she takes Tums to manage her reflux. She also complains of weight gain, which she attributes to water retention. She takes magnesium supplements and an antioxidant medicine.  She plans to see her rheumatologist soon. She plans to start taking a diuretic. She is having a bone biopsy procedure in her hip to see if the her lesion on her hip is malignant.    Past Medical History:  Diagnosis Date   Generalized anxiety disorder 06/24/2020   GERD (gastroesophageal reflux disease)     Past Surgical History:  Procedure Laterality Date   CESAREAN SECTION     MOUTH SURGERY  05/2021   UPPER GI ENDOSCOPY  04/27/2021    Family History  Problem Relation Age of Onset   Hyperlipidemia Mother    Hypertension Mother    Hyperlipidemia Father    Hypertension Father    Diabetes Father    Colon cancer Neg Hx    Stomach cancer Neg Hx    Esophageal cancer Neg Hx    Pancreatic cancer Neg Hx    Colon polyps Neg Hx    Rectal cancer Neg Hx     Social History   Socioeconomic History    Marital status: Single    Spouse name: Not on file   Number of children: Not on file   Years of education: Not on file   Highest education level: Not on file  Occupational History   Occupation: Psychologist, occupational: [employer]  Tobacco Use   Smoking status: Never   Smokeless tobacco: Never  Vaping Use   Vaping Use: Never used  Substance and Sexual Activity   Alcohol use: Never   Drug use: Never   Sexual activity: Not Currently    Birth control/protection: None  Other Topics Concern   Not on file  Social History Narrative   Right handed   Social Determinants of Health   Financial Resource Strain: Not on file  Food Insecurity: Not on file  Transportation Needs: Not on file  Physical Activity: Not on file  Stress: Not on file  Social Connections: Not on file  Intimate Partner Violence: Not on file    Outpatient Medications Prior to Visit  Medication Sig Dispense Refill   Biotin 10000 MCG TABS Take 10,000 mcg by mouth.     celecoxib (CELEBREX) 100 MG capsule Take 1 capsule (100 mg total) by mouth 2 (two) times daily. [place]  capsule 3   cetirizine (ZYRTEC ALLERGY) 10 MG tablet Take 1 tablet (10 mg total) by mouth at bedtime. 90 tablet 1   ferrous sulfate 325 (65 FE) MG tablet TAKE 325 MG BY MOUTH DAILY. 90 tablet 0   folic acid (FOLVITE) 086 MCG tablet Take 800 mcg by mouth daily.     gabapentin (NEURONTIN) 100 MG capsule Take 1 capsule every night 30 capsule 6   ibuprofen (ADVIL) 800 MG tablet Take 1 tablet (800 mg total) by mouth every 6 (six) hours as needed for moderate pain, headache or cramping. 30 tablet 0   Olopatadine HCl (PATADAY) 0.2 % SOLN Apply 1 drop to eye daily. 2.5 mL 1   omeprazole (PRILOSEC) 40 MG capsule Take 1 capsule (40 mg total) by mouth 2 (two) times daily. 90 capsule 3   traMADol-acetaminophen (ULTRACET) 37.5-325 MG tablet Take 1 tablet by mouth every 6 (six) hours as needed. 30 tablet 0   Vitamin D, Ergocalciferol, (DRISDOL) 1.25 MG (50000  UNIT) CAPS capsule Take 1 capsule (50,000 Units total) by mouth every 7 (seven) days. 12 capsule 1   erythromycin ophthalmic ointment 1cm to affected eye (in bottom eyelid) 1-3 times daily for 5-7 days. 3.5 g 0   No facility-administered medications prior to visit.    No Known Allergies  Review of Systems  Constitutional:  Negative for fever and malaise/fatigue.  HENT:  Negative for congestion.   Eyes:  Negative for blurred vision.  Respiratory:  Negative for shortness of breath.   Cardiovascular:  Positive for leg swelling (left leg). Negative for chest pain and palpitations.  Gastrointestinal:  Negative for abdominal pain, blood in stool and nausea.  Genitourinary:  Negative for dysuria and frequency.  Musculoskeletal:  Negative for falls.       (+)left shin pain (+)back pain  Skin:  Negative for rash.  Neurological:  Negative for dizziness, loss of consciousness and headaches.  Endo/Heme/Allergies:  Negative for environmental allergies.  Psychiatric/Behavioral:  Negative for depression. The patient is not nervous/anxious.        Objective:    Physical Exam Vitals and nursing note reviewed.  Constitutional:      Appearance: Normal appearance. She is not ill-appearing.  HENT:     Head: Normocephalic and atraumatic.     Right Ear: External ear normal.     Left Ear: External ear normal.  Eyes:     Extraocular Movements: Extraocular movements intact.     Pupils: Pupils are equal, round, and reactive to light.  Cardiovascular:     Rate and Rhythm: Normal rate and regular rhythm.     Pulses: Normal pulses.     Heart sounds: Normal heart sounds.  Pulmonary:     Effort: Pulmonary effort is normal. No respiratory distress.     Breath sounds: Normal breath sounds. No wheezing or rales.  Neurological:     Mental Status: She is alert and oriented to person, place, and time.  Psychiatric:        Judgment: Judgment normal.     BP 119/78 (BP Location: Left Arm, Cuff Size:  Large)   Pulse 84   Temp 98.7 F (37.1 C) (Oral)   Resp 16   Ht '5\' 7"'$  (1.702 m)   Wt 263 lb 6.4 oz (119.5 kg)   LMP 11/19/2021   SpO2 99%   BMI 41.25 kg/m  Wt Readings from Last 3 Encounters:  01/04/22 253 lb (114.8 kg)  01/03/22 258 lb 1.6 oz (117.1 kg)  12/19/21  263 lb 6.4 oz (119.5 kg)    Diabetic Foot Exam - Simple   No data filed    Lab Results  Component Value Date   WBC 6.5 11/15/2021   HGB 11.2 (L) 11/15/2021   HCT 34.6 (L) 11/15/2021   PLT 250 11/15/2021   GLUCOSE 102 (H) 11/15/2021   CHOL 135 08/08/2021   TRIG 148.0 08/08/2021   HDL 32.10 (L) 08/08/2021   LDLCALC 73 08/08/2021   ALT 12 11/15/2021   AST 13 (L) 11/15/2021   NA 138 11/15/2021   K 4.1 11/15/2021   CL 103 11/15/2021   CREATININE 0.74 11/15/2021   BUN 10 11/15/2021   CO2 26 11/15/2021   TSH 1.25 11/16/2021   INR 1.0 04/27/2021   HGBA1C 5.6 07/11/2020    Lab Results  Component Value Date   TSH 1.25 11/16/2021   Lab Results  Component Value Date   WBC 6.5 11/15/2021   HGB 11.2 (L) 11/15/2021   HCT 34.6 (L) 11/15/2021   MCV 80.8 11/15/2021   PLT 250 11/15/2021   Lab Results  Component Value Date   NA 138 11/15/2021   K 4.1 11/15/2021   CO2 26 11/15/2021   GLUCOSE 102 (H) 11/15/2021   BUN 10 11/15/2021   CREATININE 0.74 11/15/2021   BILITOT 0.3 11/15/2021   ALKPHOS 88 11/15/2021   AST 13 (L) 11/15/2021   ALT 12 11/15/2021   PROT 8.0 11/15/2021   ALBUMIN 4.4 11/15/2021   CALCIUM 9.5 11/15/2021   ANIONGAP 9 11/15/2021   GFR 122.90 08/08/2021   Lab Results  Component Value Date   CHOL 135 08/08/2021   Lab Results  Component Value Date   HDL 32.10 (L) 08/08/2021   Lab Results  Component Value Date   LDLCALC 73 08/08/2021   Lab Results  Component Value Date   TRIG 148.0 08/08/2021   Lab Results  Component Value Date   CHOLHDL 4 08/08/2021   Lab Results  Component Value Date   HGBA1C 5.6 07/11/2020     Bone Density- 04/03/2021. Follow up in 2 years.  Assessment Results: The BMD measured at Forearm Radius 33% is 0.866 g/cm2 with a Z-score of -0.1.The Z-score is within the expected range for age.     Assessment & Plan:   Problem List Items Addressed This Visit       Unprioritized   GERD (gastroesophageal reflux disease)   Relevant Medications   omeprazole (PRILOSEC) 20 MG capsule   Myalgia    Rheum app pending  fmla filled out      Other Visit Diagnoses     Lower extremity edema    -  Primary   Relevant Medications   furosemide (LASIX) 40 MG tablet        Meds ordered this encounter  Medications   omeprazole (PRILOSEC) 20 MG capsule    Sig: Take 1 capsule (20 mg total) by mouth daily.    Dispense:  30 capsule    Refill:  3   furosemide (LASIX) 40 MG tablet    Sig: Take 1 tablet (40 mg total) by mouth daily.    Dispense:  30 tablet    Refill:  3    I, Ann Held, DO, personally preformed the services described in this documentation.  All medical record entries made by the scribe were at my direction and in my presence.  I have reviewed the chart and discharge instructions (if applicable) and agree that the record reflects my personal  performance and is accurate and complete. 12/19/2021   I,Tinashe Williams,acting as a scribe for Ann Held, DO.,have documented all relevant documentation on the behalf of Ann Held, DO,as directed by  Ann Held, DO while in the presence of Ann Held, DO.    Ann Held, DO

## 2021-12-19 NOTE — Patient Instructions (Signed)
Edema  Edema is an abnormal buildup of fluids in the body tissues and under the skin. Swelling of the legs, feet, and ankles is a common symptom that becomes more likely as you get older. Swelling is also common in looser tissues, such as around the eyes. Pressing on the area may make a temporary dent in your skin (pitting edema). This fluid may also accumulate in your lungs (pulmonary edema). There are many possible causes of edema. Eating too much salt (sodium) and being on your feet or sitting for a long time can cause edema in your legs, feet, and ankles. Common causes of edema include: Certain medical conditions, such as heart failure, liver or kidney disease, and cancer. Weak leg blood vessels. An injury. Pregnancy. Medicines. Being obese. Low protein levels in the blood. Hot weather may make edema worse. Edema is usually painless. Your skin may look swollen or shiny. Follow these instructions at home: Medicines Take over-the-counter and prescription medicines only as told by your health care provider. Your health care provider may prescribe a medicine to help your body get rid of extra water (diuretic). Take this medicine if you are told to take it. Eating and drinking Eat a low-salt (low-sodium) diet to reduce fluid as told by your health care provider. Sometimes, eating less salt may reduce swelling. Depending on the cause of your swelling, you may need to limit how much fluid you drink (fluid restriction). General instructions Raise (elevate) the injured area above the level of your heart while you are sitting or lying down. Do not sit still or stand for long periods of time. Do not wear tight clothing. Do not wear garters on your upper legs. Exercise your legs to get your circulation going. This helps to move the fluid back into your blood vessels, and it may help the swelling go down. Wear compression stockings as told by your health care provider. These stockings help to prevent  blood clots and reduce swelling in your legs. It is important that these are the correct size. These stockings should be prescribed by your health care provider to prevent possible injuries. If elastic bandages or wraps are recommended, use them as told by your health care provider. Contact a health care provider if: Your edema does not get better with treatment. You have heart, liver, or kidney disease and have symptoms of edema. You have sudden and unexplained weight gain. Get help right away if: You develop shortness of breath or chest pain. You cannot breathe when you lie down. You develop pain, redness, or warmth in the swollen areas. You have heart, liver, or kidney disease and suddenly get edema. You have a fever and your symptoms suddenly get worse. These symptoms may be an emergency. Get help right away. Call 911. Do not wait to see if the symptoms will go away. Do not drive yourself to the hospital. Summary Edema is an abnormal buildup of fluids in the body tissues and under the skin. Eating too much salt (sodium)and being on your feet or sitting for a long time can cause edema in your legs, feet, and ankles. Raise (elevate) the injured area above the level of your heart while you are sitting or lying down. Follow your health care provider's instructions about diet and how much fluid you can drink. This information is not intended to replace advice given to you by your health care provider. Make sure you discuss any questions you have with your health care provider. Document Revised: 01/16/2021 Document   Reviewed: 01/16/2021 Elsevier Patient Education  2023 Elsevier Inc.  

## 2021-12-20 ENCOUNTER — Encounter: Payer: Self-pay | Admitting: Family Medicine

## 2022-01-03 ENCOUNTER — Encounter: Payer: No Typology Code available for payment source | Attending: General Surgery | Admitting: Skilled Nursing Facility1

## 2022-01-03 ENCOUNTER — Encounter (INDEPENDENT_AMBULATORY_CARE_PROVIDER_SITE_OTHER): Payer: Self-pay

## 2022-01-03 NOTE — Progress Notes (Signed)
Supervised Weight Loss Visit Bariatric Nutrition Education  4 out of 6 SWL Appointments   Planned surgery: RYGB    NUTRITION ASSESSMENT   Anthropometrics  Start weight at [facility]: 258.9 lbs (date: 09/21/2021)  Height: 66 in Weight: 258.1 pounds BMI: 41.66 kg/m2     Clinical  Medical hx: GERD, anxiety Medications: vitamin D, iron, B12, omeprazole; biotin, iron, fenugreek, collegan, fish oil, foic acid, vimtain C, vimtain D Labs: Vit D 29.98; HDL 32.10, Ferritin 8.5, hemoglobin 11.2, HCT 34.6;  RDW 16.7, calcium Notable signs/symptoms: headaches, fatigue, joint pain, back pain, low energy, knee pain Any previous deficiencies? YES: b12, vitamin D  Lifestyle & Dietary Hx   Pt states she wants to start strength training but has been doing mandatory overtime so has not had time.   Pt states she was doing fasting because she was preparing for a vacation and wanted to lose some weight prior to going.   Dietitian asked pt if there was anything behaviorally she was ready to work on for the next month, pt states she does not need to work on anything, Dietitian suggested avoiding going longer than 5 hours without eating due to the likelihood of developing nausea with this behavior post operatively, pt states she does not need to make any changes, she has never eaten breakfast and she never will after further explanation and discussion pt states she will try a protein shake or yogurt within 1-1.5 hours of waking.   Estimated daily fluid intake: 125 oz Supplements: vitamin D, iron, B12,  biotin, iron, fenugreek, collegan, fish oil, foic acid, vimtain C, vitamin D Current average weekly physical activity: 7 days a week walking 60 minutes   24-Hr Dietary Recall: wakes at 4-5am First Meal 11-12: Overnight oats with fruit, or fruit, or Belvita biscuit/cookie or protein shake + yogurt + granola Snack: popcorn or tomato/cucumber Second Meal 2-3: tuna or salad or eat out at [employer] or [employer] Snack:  yogurt and granola Third Meal:  Snack:  Beverages: water, carbonated water, protein shake, juicing juice   NUTRITION DIAGNOSIS  Overweight/obesity (Tool-3.3) related to past poor dietary habits and physical inactivity as evidenced by patient w/ planned RYGB surgery following dietary guidelines for continued weight loss.   NUTRITION INTERVENTION  Nutrition counseling (C-1) and education (E-2) to facilitate bariatric surgery goals.  Pre-Op Goals Progress & New Goals Continue:  eat 3 meals a day and snacks like fruit or non-starchy vegetable paired with a protein like peanut butter or humus.  Continue: eat tuna/salmon/shrimp or beans at meals and not rely on protein shake to meet protein needs. NEW: do not go longer than 5 hours without eating try a protein shake or yogurt within 1-1.5 hours of waking  Handouts Provided Include    Learning Style & Readiness for Change Teaching method utilized: Visual & Auditory  Demonstrated degree of understanding via: Teach Back  Readiness Level: pre-contemplative Barriers to learning/adherence to lifestyle change: single mother  RD's Notes for next Visit  Assess adherence to chosen goals   MONITORING & EVALUATION Dietary intake, weekly physical activity, body weight, and pre-op goals in 1 month.   Next Steps  Patient is to return to [facility] in 1 month

## 2022-01-04 ENCOUNTER — Other Ambulatory Visit: Payer: Self-pay

## 2022-01-04 ENCOUNTER — Ambulatory Visit (HOSPITAL_BASED_OUTPATIENT_CLINIC_OR_DEPARTMENT_OTHER): Payer: No Typology Code available for payment source | Attending: Pulmonary Disease | Admitting: Pulmonary Disease

## 2022-01-04 DIAGNOSIS — R400 Somnolence: Secondary | ICD-10-CM | POA: Diagnosis not present

## 2022-01-04 DIAGNOSIS — F5104 Psychophysiologic insomnia: Secondary | ICD-10-CM | POA: Insufficient documentation

## 2022-01-04 DIAGNOSIS — G4730 Sleep apnea, unspecified: Secondary | ICD-10-CM | POA: Diagnosis not present

## 2022-01-04 DIAGNOSIS — R0683 Snoring: Secondary | ICD-10-CM | POA: Diagnosis present

## 2022-01-04 DIAGNOSIS — G478 Other sleep disorders: Secondary | ICD-10-CM | POA: Insufficient documentation

## 2022-01-04 DIAGNOSIS — R0902 Hypoxemia: Secondary | ICD-10-CM | POA: Insufficient documentation

## 2022-01-05 ENCOUNTER — Encounter: Payer: Self-pay | Admitting: Family Medicine

## 2022-01-06 NOTE — Assessment & Plan Note (Addendum)
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

## 2022-01-08 ENCOUNTER — Encounter (HOSPITAL_BASED_OUTPATIENT_CLINIC_OR_DEPARTMENT_OTHER): Payer: No Typology Code available for payment source | Admitting: Pulmonary Disease

## 2022-01-15 ENCOUNTER — Encounter (HOSPITAL_BASED_OUTPATIENT_CLINIC_OR_DEPARTMENT_OTHER): Payer: Self-pay | Admitting: Cardiovascular Disease

## 2022-01-15 NOTE — Procedures (Signed)
Patient Name: Jodi Norman, Jodi Norman Date: 01/04/2022 Gender: Female D.O.B: 07-02-1995 Age (years): 25 Referring Provider: Godfrey Pick Tobb DO Height (inches): 67 Interpreting Physician: Shelva Majestic MD, [facility] Weight (lbs): 253 RPSGT: Jorge Ny BMI: 40 MRN: [id number] Neck Size: 15.00  CLINICAL INFORMATION Sleep Study Type: NPSG  Indication for sleep study: Daytime Fatigue, Fatigue, Insomnia, Obesity, Snoring  Epworth Sleepiness Score: 12  SLEEP STUDY TECHNIQUE As per the AASM Manual for the Scoring of Sleep and Associated Events v2.3 (April 2016) with a hypopnea requiring 4% desaturations.  The channels recorded and monitored were frontal, central and occipital EEG, electrooculogram (EOG), submentalis EMG (chin), nasal and oral airflow, thoracic and abdominal wall motion, anterior tibialis EMG, snore microphone, electrocardiogram, and pulse oximetry.  MEDICATIONS Biotin 10000 MCG TABS celecoxib (CELEBREX) 100 MG capsule cetirizine (ZYRTEC ALLERGY) 10 MG tablet ferrous sulfate 325 (65 FE) MG tablet folic acid (FOLVITE) 932 MCG tablet furosemide (LASIX) 40 MG tablet gabapentin (NEURONTIN) 100 MG capsule ibuprofen (ADVIL) 800 MG tablet Olopatadine HCl (PATADAY) 0.2 % SOLN omeprazole (PRILOSEC) 20 MG capsule omeprazole (PRILOSEC) 40 MG capsule traMADol-acetaminophen (ULTRACET) 37.5-325 MG tablet Vitamin D, Ergocalciferol, (DRISDOL) 1.25 MG (50000 UNIT) CAPS capsule Medications self-administered by patient taken the night of the study : TURMERIC, Multi Collagen, FISH OIL, MAGNESIUM, VITAMIN A, Vitamin D3  SLEEP ARCHITECTURE The study was initiated at 10:12:11 PM and ended at 4:57:17 AM.  Sleep onset time was 1.2 minutes and the sleep efficiency was 94.5%%. The total sleep time was 382.9 minutes.  Stage REM latency was 62.5 minutes.  The patient spent 3.9%% of the night in stage N1 sleep, 63.6%% in stage N2 sleep, 6.8%% in stage N3 and 25.7% in REM.  Alpha  intrusion was absent.  Supine sleep was 13.80%.  RESPIRATORY PARAMETERS The overall apnea/hypopnea index (AHI) was 4.9 per hour. The respiratory index (RDI) was 5.5/h. There were 0 total apneas, including 0 obstructive, 0 central and 0 mixed apneas. There were 31 hypopneas and 4 RERAs.  The AHI during Stage REM sleep was 14.0 per hour.  AHI while supine was 4.5 per hour.  The mean oxygen saturation was 94.7%. The minimum SpO2 during sleep was 87.0%.  Moderate snoring was noted during this study.  CARDIAC DATA The 2 lead EKG demonstrated sinus rhythm. The mean heart rate was 84.7 beats per minute. Other EKG findings include: None.  LEG MOVEMENT DATA The total PLMS were 0 with a resulting PLMS index of 0.0. Associated arousal with leg movement index was 3.0 .  IMPRESSIONS - Increased upper airway resistance syndrome (UARS) / borderline sleep apnea (AHI 4.9; RDI 5.5/h); however, sleep apnea was mild-moderate during REM sleep (AHI 14.0; RDI 15.8/h). - Mild oxygen desaturation to a nadir of 87%. - The patient snored with moderate snoring volume. - No cardiac abnormalities were noted during this study. - Clinically significant periodic limb movements did not occur during sleep. No significant associated arousals.  DIAGNOSIS - Sleep apnea, unspecified (G47.30) - UARS  - Nocturnal Hypoxemia (G47.36)\ - Moderate snoring  RECOMMENDATIONS - The patient has very mild sleep disordered breathing. - Effort should be made to optimize nasal and oropharyngeal patency. - At present, consider alternatives for treatment of snoring. With mild symptoms consider alternatives to CPAP such as a customized oral appliance. If symptoms progress, consider a re-evaluation for CPAP therapy. - Avoid alcohol, sedatives and other CNS depressants that may worsen sleep apnea and disrupt normal sleep architecture. - Sleep hygiene should be reviewed to assess factors that  may improve sleep quality. - Weight  management (BMI 40) and regular exercise should be initiated or continued if appropriate.  [Electronically signed] 01/14/2022 03:56 PM  Shelva Majestic MD, [facility], [place], American Board of Sleep Medicine  NPI: [id number]  [place] PH: [phone]   FX: [phone] [place]

## 2022-01-23 ENCOUNTER — Telehealth: Payer: Self-pay | Admitting: *Deleted

## 2022-01-23 NOTE — Telephone Encounter (Signed)
Left message to return a call to discuss sleep study results. 

## 2022-01-23 NOTE — Telephone Encounter (Signed)
-----   Message from Troy Sine, MD sent at 01/15/2022 10:28 AM EDT ----- Mariann Laster, please notify pt of the results.

## 2022-02-01 ENCOUNTER — Ambulatory Visit: Payer: No Typology Code available for payment source | Admitting: Dietician

## 2022-02-02 NOTE — Telephone Encounter (Signed)
Left message to return a call to discuss sleep study results. 

## 2022-02-05 ENCOUNTER — Encounter: Payer: Self-pay | Admitting: Skilled Nursing Facility1

## 2022-02-05 ENCOUNTER — Encounter: Payer: No Typology Code available for payment source | Attending: Family Medicine | Admitting: Skilled Nursing Facility1

## 2022-02-05 NOTE — Progress Notes (Signed)
Supervised Weight Loss Visit Bariatric Nutrition Education  5 out of 6 SWL Appointments   Planned surgery: RYGB  NUTRITION ASSESSMENT   Anthropometrics  Start weight at [facility]: 258.9 lbs (date: 09/21/2021)   Weight: virtual appt; pt identified by name and DOB, pt agreeable to the limitations of this visit type   Clinical  Medical hx: GERD, anxiety Medications: vitamin D, iron, B12, omeprazole; biotin, iron, fenugreek, collegan, fish oil, foic acid, vimtain C, vimtain D Labs: Vit D 29.98; HDL 32.10, Ferritin 8.5, hemoglobin 11.2, HCT 34.6;  RDW 16.7, calcium Notable signs/symptoms: headaches, fatigue, joint pain, back pain, low energy, knee pain Any previous deficiencies? YES: b12, vitamin D  Lifestyle & Dietary Hx  Pt states she is ready for these appts to get over with. Pt states she is a very picky eater she eats what she eats and that's it.    Pt states she is trying to eat breakfast now.    Estimated daily fluid intake: 125 oz Supplements: vitamin D, iron, B12,  biotin, iron, fenugreek, collegan, fish oil, foic acid, vimtain C, vitamin D Current average weekly physical activity: 7 days a week walking 60 minutes   24-Hr Dietary Recall: wakes at 4-5am First Meal 7:30: granola bar and protein shake Snack: popcorn or tomato/cucumber Second Meal 2-3: oatmeal packet with fruit, or fruit, or Belvita biscuit/cookie or protein shake + yogurt + granola Snack: yogurt and granola Third Meal: mashed potatoes + spinach + chicken Snack:  Beverages: water, carbonated water, protein shake, juicing juice   NUTRITION DIAGNOSIS  Overweight/obesity ([place]-3.3) related to past poor dietary habits and physical inactivity as evidenced by patient w/ planned RYGB surgery following dietary guidelines for continued weight loss.   NUTRITION INTERVENTION  Nutrition counseling (C-1) and education (E-2) to facilitate bariatric surgery goals.  Pre-Op Goals Progress & New Goals Continue:  eat 3 meals  a day and snacks like fruit or non-starchy vegetable paired with a protein like peanut butter or humus.  Continue: eat tuna/salmon/shrimp or beans at meals and not rely on protein shake to meet protein needs. continue: do not go longer than 5 hours without eating try a protein shake or yogurt within 1-1.5 hours of waking  Handouts Provided Include    Learning Style & Readiness for Change Teaching method utilized: Visual & Auditory  Demonstrated degree of understanding via: Teach Back  Readiness Level: pre-contemplative Barriers to learning/adherence to lifestyle change: single mother; likes what she likes   RD's Notes for next Visit  Assess adherence to chosen goals   MONITORING & EVALUATION Dietary intake, weekly physical activity, body weight, and pre-op goals in 1 month.   Next Steps  Patient is to return to [facility] in 1 month

## 2022-02-06 ENCOUNTER — Encounter: Payer: Self-pay | Admitting: Family Medicine

## 2022-02-06 ENCOUNTER — Ambulatory Visit: Payer: No Typology Code available for payment source | Admitting: Family Medicine

## 2022-02-06 ENCOUNTER — Telehealth: Payer: Self-pay

## 2022-02-06 DIAGNOSIS — R768 Other specified abnormal immunological findings in serum: Secondary | ICD-10-CM | POA: Diagnosis not present

## 2022-02-06 DIAGNOSIS — R600 Localized edema: Secondary | ICD-10-CM | POA: Diagnosis not present

## 2022-02-06 MED ORDER — FUROSEMIDE 40 MG PO TABS: 40.0000 mg | ORAL_TABLET | Freq: Every day | ORAL | 3 refills | Status: DC

## 2022-02-06 NOTE — Progress Notes (Unsigned)
   Established Patient Office Visit  Subjective   Patient ID: MANDEEP FERCH, female    DOB: 05-02-1996  Age: 26 y.o. MRN: [id number]  Chief Complaint  Patient presents with   referral     To bariatric surgeon.    fmla     Need more information     HPI  {History (Optional):23778}  ROS    Objective:     BP (!) 110/54   Pulse 94   Temp 98.7 F (37.1 C) (Oral)   Ht '5\' 7"'$  (1.702 m)   Wt 257 lb 9.6 oz (116.8 kg)   LMP 01/16/2022   SpO2 98%   BMI 40.35 kg/m  {Vitals History (Optional):23777}  Physical Exam   No results found for any visits on 02/06/22.  {Labs (Optional):23779}  The ASCVD Risk score (Arnett DK, et al., 2019) failed to calculate for the following reasons:   The 2019 ASCVD risk score is only valid for ages 48 to 52    Assessment & Plan:   Problem List Items Addressed This Visit   None   No follow-ups on file.    Ann Held, DO

## 2022-02-06 NOTE — Telephone Encounter (Signed)
Pt has come in for an appointment and the provider has completed the FMLA forms. A copy has been placed in the scan bin and another copy has been placed on CMAs desk.   Pt has a copy of the forms and she stated that she works in the [employer] and will bring the forms to that office. Pt also got a copy of the old FMLA forms to compare the two.

## 2022-02-07 DIAGNOSIS — R768 Other specified abnormal immunological findings in serum: Secondary | ICD-10-CM | POA: Insufficient documentation

## 2022-02-07 NOTE — Assessment & Plan Note (Signed)
Rheum app in oct  fmla filled out again

## 2022-02-07 NOTE — Assessment & Plan Note (Signed)
Refer to bariatric surgery

## 2022-02-12 NOTE — Telephone Encounter (Signed)
Left 3 message to return a call to discuss sleep study results.

## 2022-02-13 ENCOUNTER — Telehealth: Payer: Self-pay | Admitting: Family Medicine

## 2022-02-13 ENCOUNTER — Telehealth: Payer: Self-pay | Admitting: *Deleted

## 2022-02-13 NOTE — Telephone Encounter (Signed)
Pt called stating that the referral sent to [facility] was not received by the office. She stated she had called them to follow up and they told her that they had not received the referral yet. Pt would like to have it resent when possible.

## 2022-02-13 NOTE — Telephone Encounter (Signed)
F: [phone]

## 2022-02-13 NOTE — Telephone Encounter (Signed)
-----   Message from Troy Sine, MD sent at 01/15/2022 10:28 AM EDT ----- Mariann Laster, please notify pt of the results.

## 2022-02-13 NOTE — Telephone Encounter (Signed)
Sent... Done

## 2022-02-13 NOTE — Telephone Encounter (Signed)
Patient informed of sleep study results. She states that she is in the process of getting weight loss surgery. Hopefully this will help with her symptoms. If not, then she will look into getting a oral appliance.

## 2022-02-25 NOTE — Progress Notes (Deleted)
Office Visit Note  Patient: Jodi Norman             Date of Birth: September 01, 1995           MRN: [id number]             PCP: Ann Held, DO Referring: Ann Held, * Visit Date: 02/26/2022 Occupation: @GUAROCC @  Subjective:  No chief complaint on file.   History of Present Illness: Jodi Norman is a 26 y.o. female here for evaluation of positive ANA. This was checked in association with abnormal lymph node and bone lesions on PET imaging without known malignancy. She was prescribed celebrex for body pains.***   Labs reviewed 10/2021 ANA 1:40 speckled dsDNA neg RF neg ESR 28 LDH 174 LA neg  04/27/21 Supraclavicular lymph node biopsy A. LYMPH NODE, RIGHT SUPRACLAVICULAR, NEEDLE CORE BIOPSY:  -No tumor identified  -See comment   COMMENT:  The sections show small needle core biopsy fragments of lymph node  tissue displaying preservation of the architecture.  The lymphoid tissue  displays scattering of lymphoid follicles with early small germinal  center formation.  The interfollicular zone is primarily composed of  small lymphoid cells.  No granulomata or metastatic malignancy  identified.  Flow cytometric analysis was performed [facility] [phone]) and  shows polyclonal B-cell population and nonspecific T-cell changes.  In  addition, immunohistochemical stains for Bcl-2, BCL6, CD10, CD20, CD3,  CD5, cyclin D1 and CD23 were performed with appropriate controls.  The  stains show a mixture of T and B cells in their apparently respective  compartments.  Scattered germinal centers are CD10 and BCL6 positive and  Bcl-2 negative.  CD23 highlights the follicular dendritic networks  within the lymphoid follicles.  No significant cyclin D1 positivity or  co-expression of CD5 in B-cell areas identified.  The overall features  are not considered specific but there is no diagnostic or definitive  evidence of a lymphoproliferative process or metastatic malignancy.  Clinical and  correlation is recommended   Activities of Daily Living:  Patient reports morning stiffness for *** {minute/hour:19697}.   Patient {ACTIONS;DENIES/REPORTS:21021675::"Denies"} nocturnal pain.  Difficulty dressing/grooming: {ACTIONS;DENIES/REPORTS:21021675::"Denies"} Difficulty climbing stairs: {ACTIONS;DENIES/REPORTS:21021675::"Denies"} Difficulty getting out of chair: {ACTIONS;DENIES/REPORTS:21021675::"Denies"} Difficulty using hands for taps, buttons, cutlery, and/or writing: {ACTIONS;DENIES/REPORTS:21021675::"Denies"}  No Rheumatology ROS completed.   PMFS History:  Patient Active Problem List   Diagnosis Date Noted   Positive ANA (antinuclear antibody) 02/07/2022   Myalgia 11/24/2021   Arthralgia 11/14/2021   Iron deficiency anemia 08/17/2021   GERD (gastroesophageal reflux disease)    Morbid obesity ([name]) 06/05/2021   History of scoliosis 03/06/2021   Chronic midline back pain 03/06/2021   Bone lesion 10/27/2020   Daytime somnolence 07/11/2020   Other fatigue 25/42/7062   Metabolic syndrome 37/62/8315   History of pre-eclampsia  07/11/2020   Obesity (BMI 30-39.9) 07/11/2020   Insomnia 07/08/2020   Intractable migraine without aura and without status migrainosus 07/08/2020   Chest pain 07/08/2020   Generalized anxiety disorder 06/24/2020   Carpal tunnel syndrome, bilateral 05/07/2018   Cesarean delivery delivered 04/28/2018   Right hand paresthesia 06/19/2017    Past Medical History:  Diagnosis Date   Generalized anxiety disorder 06/24/2020   GERD (gastroesophageal reflux disease)     Family History  Problem Relation Age of Onset   Hyperlipidemia Mother    Hypertension Mother    Hyperlipidemia Father    Hypertension Father    Diabetes Father    Colon cancer Neg Hx  Stomach cancer Neg Hx    Esophageal cancer Neg Hx    Pancreatic cancer Neg Hx    Colon polyps Neg Hx    Rectal cancer Neg Hx    Past Surgical History:  Procedure Laterality Date   CESAREAN  SECTION     MOUTH SURGERY  05/2021   UPPER GI ENDOSCOPY  04/27/2021   Social History   Social History Narrative   Right handed   Immunization History  Administered Date(s) Administered   DTaP 01/08/2001   DTaP / HiB 06/10/1996, 08/19/1996, 11/04/1996, 07/27/1998   Hepatitis A, Ped/Adol-2 Dose 10/23/2007, 05/03/2008   Hepatitis B, PED/ADOLESCENT 02-15-96, 05/13/1996, 08/19/1996   IPV 01/08/2001   Influenza, Seasonal, Injecte, Preservative Fre 02/25/2018   MMR 06/18/1997, 11/13/1999   Meningococcal Conjugate 10/23/2007   OPV 06/10/1996, 08/19/1996, 11/04/1996, 07/27/1998   PPD Test 01/24/2021   Tdap 02/01/2007, 03/03/2018   Varicella 07/27/1998, 01/14/2006     Objective: Vital Signs: LMP 01/16/2022    Physical Exam   Musculoskeletal Exam: ***  CDAI Exam: CDAI Score: -- Patient Global: --; Provider Global: -- Swollen: --; Tender: -- Joint Exam 02/26/2022   No joint exam has been documented for this visit   There is currently no information documented on the homunculus. Go to the Rheumatology activity and complete the homunculus joint exam.  Investigation: No additional findings.  Imaging: No results found.  Recent Labs: Lab Results  Component Value Date   WBC 6.5 11/15/2021   HGB 11.2 (L) 11/15/2021   PLT 250 11/15/2021   NA 138 11/15/2021   K 4.1 11/15/2021   CL 103 11/15/2021   CO2 26 11/15/2021   GLUCOSE 102 (H) 11/15/2021   BUN 10 11/15/2021   CREATININE 0.74 11/15/2021   BILITOT 0.3 11/15/2021   ALKPHOS 88 11/15/2021   AST 13 (L) 11/15/2021   ALT 12 11/15/2021   PROT 8.0 11/15/2021   ALBUMIN 4.4 11/15/2021   CALCIUM 9.5 11/15/2021    Speciality Comments: No specialty comments available.  Procedures:  No procedures performed Allergies: Patient has no known allergies.   Assessment / Plan:     Visit Diagnoses: No diagnosis found.  Orders: No orders of the defined types were placed in this encounter.  No orders of the defined types  were placed in this encounter.   Face-to-face time spent with patient was *** minutes. Greater than 50% of time was spent in counseling and coordination of care.  Follow-Up Instructions: No follow-ups on file.   Collier Salina, MD  Note - This record has been created using [employer].  Chart creation errors have been sought, but may not always  have been located. Such creation errors do not reflect on  the standard of medical care.

## 2022-02-26 ENCOUNTER — Telehealth: Payer: Self-pay | Admitting: Family Medicine

## 2022-02-26 ENCOUNTER — Ambulatory Visit: Payer: No Typology Code available for payment source | Attending: Internal Medicine | Admitting: Internal Medicine

## 2022-02-26 ENCOUNTER — Telehealth: Payer: Self-pay | Admitting: *Deleted

## 2022-02-26 NOTE — Telephone Encounter (Signed)
Patient contacted the office to reschedule her new patient appointment she no showed this morning. Patient advised that we do not reschedule new patient appointments that were "no show". Patient states she did not know she had an appointment this morning and it was scheduled "so far in advance". Her appointment was scheduled 12/22/2021. She received 2 phone call reminders which show that they were delivered and a voicemail was left reminding her of her appointment. She also has my chart and my chart reminders were sent. Patient advised we would be unable to reschedule this appointment.

## 2022-02-26 NOTE — Telephone Encounter (Signed)
Pt called stating she needs a new referral for Rheumatology due to missing her NP appt with them and the office is not looking to pursue care with her. Not sure if she needs a brand new referral put in or if the current one can just be rerouted. Please Advise.

## 2022-03-05 ENCOUNTER — Ambulatory Visit: Payer: No Typology Code available for payment source | Admitting: Dietician

## 2022-03-30 ENCOUNTER — Other Ambulatory Visit: Payer: Self-pay

## 2022-03-30 ENCOUNTER — Emergency Department (HOSPITAL_BASED_OUTPATIENT_CLINIC_OR_DEPARTMENT_OTHER)
Admission: EM | Admit: 2022-03-30 | Discharge: 2022-03-30 | Disposition: A | Discharge status: Home / Self Care | Payer: No Typology Code available for payment source | Attending: Emergency Medicine | Admitting: Emergency Medicine

## 2022-03-30 ENCOUNTER — Encounter (HOSPITAL_BASED_OUTPATIENT_CLINIC_OR_DEPARTMENT_OTHER): Payer: Self-pay | Admitting: Emergency Medicine

## 2022-03-30 ENCOUNTER — Emergency Department (HOSPITAL_BASED_OUTPATIENT_CLINIC_OR_DEPARTMENT_OTHER): Payer: No Typology Code available for payment source

## 2022-03-30 DIAGNOSIS — H6593 Unspecified nonsuppurative otitis media, bilateral: Secondary | ICD-10-CM | POA: Insufficient documentation

## 2022-03-30 DIAGNOSIS — J069 Acute upper respiratory infection, unspecified: Secondary | ICD-10-CM | POA: Insufficient documentation

## 2022-03-30 DIAGNOSIS — R059 Cough, unspecified: Secondary | ICD-10-CM | POA: Diagnosis present

## 2022-03-30 DIAGNOSIS — Z20822 Contact with and (suspected) exposure to covid-19: Secondary | ICD-10-CM | POA: Diagnosis not present

## 2022-03-30 LAB — RESP PANEL BY RT-PCR (FLU A&B, COVID) ARPGX2
Influenza A by PCR: NEGATIVE
Influenza B by PCR: NEGATIVE
SARS Coronavirus 2 by RT PCR: NEGATIVE

## 2022-03-30 MED ORDER — GUAIFENESIN 100 MG/5ML PO LIQD
5.0000 mL | Freq: Once | ORAL | Status: AC
  Administered 2022-03-30: 5 mL via ORAL
  Filled 2022-03-30: qty 10

## 2022-03-30 MED ORDER — DEXTROMETHORPHAN-GUAIFENESIN 10-100 MG/5ML PO LIQD: 5.0000 mL | ORAL | 0 refills | Status: DC | PRN

## 2022-03-30 MED ORDER — IBUPROFEN 600 MG PO TABS: 600.0000 mg | ORAL_TABLET | Freq: Four times a day (QID) | ORAL | 0 refills | Status: DC | PRN

## 2022-03-30 MED ORDER — BENZONATATE 100 MG PO CAPS: 200.0000 mg | ORAL_CAPSULE | Freq: Three times a day (TID) | ORAL | 0 refills | Status: DC

## 2022-03-30 MED ORDER — BENZONATATE 100 MG PO CAPS
200.0000 mg | ORAL_CAPSULE | Freq: Once | ORAL | Status: AC
Start: 2022-03-30 — End: 2022-03-30
  Administered 2022-03-30: 200 mg via ORAL
  Filled 2022-03-30: qty 2

## 2022-03-30 MED ORDER — IBUPROFEN 400 MG PO TABS
600.0000 mg | ORAL_TABLET | Freq: Once | ORAL | Status: AC
  Administered 2022-03-30: 600 mg via ORAL
  Filled 2022-03-30: qty 1

## 2022-03-30 MED ORDER — CETIRIZINE HCL 1 MG/ML PO SOLN: 10.0000 mg | Freq: Every day | ORAL | 0 refills | Status: DC

## 2022-03-30 NOTE — ED Triage Notes (Addendum)
Strong cough and head congestion x 2 weeks. Cough is productive. Anterior chest wall pain associated with cough. Phlegm is green.

## 2022-03-30 NOTE — Discharge Instructions (Addendum)
The work-up today was overall reassuring.  Chest negative for COVID and influenza.  Chest x-ray was negative for any acute abnormalities in the chest.  Recommend continuation of at home therapy of benzonatate 200 mg, [place] DM, daily histamine such as Zyrtec as well as ibuprofen as needed for pain.  Recommend follow-up with PCP in 3 to 5 days for reevaluation of your symptoms.  Please not hesitate to return to emergency department the worrisome signs symptoms we discussed become apparent.

## 2022-03-30 NOTE — ED Provider Notes (Signed)
[facility] EMERGENCY DEPARTMENT Provider Note   CSN: [id number] Arrival date & time: 03/30/22  2029     History  Chief Complaint  Patient presents with   Cough    Jodi Norman is a 26 y.o. female.   Cough   26 year old female presents emergency department with complaints of cough, nasal congestion, bilateral ear pressure, sore throat.  Patient states symptoms present for the past 2 weeks.  She reports some associated chest pain with cough.  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  Denies fever, chills, abdominal pain, nausea, vomiting, urinary/vaginal symptoms, change in bowel habits, neck stiffness.  Patient states she has taken at home medicines of cetirizine, benzonatate with minimal to no relief of symptoms.  Past medical history significant for GERD, generalized anxiety disorder  Home Medications Prior to Admission medications   Medication Sig Start Date End Date Taking? Authorizing Provider  benzonatate (TESSALON) 100 MG capsule Take 2 capsules (200 mg total) by mouth every 8 (eight) hours. 03/30/22  Yes Dion Saucier A, PA  cetirizine HCl (ZYRTEC) 1 MG/ML solution Take 10 mLs (10 mg total) by mouth daily. 03/30/22  Yes Dion Saucier A, PA  dextromethorphan-guaiFENesin (TUSSIN DM) 10-100 MG/5ML liquid Take 5 mLs by mouth every 4 (four) hours as needed for cough. 03/30/22  Yes Dion Saucier A, PA  ibuprofen (ADVIL) 600 MG tablet Take 1 tablet (600 mg total) by mouth every 6 (six) hours as needed. 03/30/22  Yes Dion Saucier A, PA  Biotin 10000 MCG TABS Take 10,000 mcg by mouth.    [provider]  celecoxib (CELEBREX) 100 MG capsule Take 1 capsule (100 mg total) by mouth 2 (two) times daily. 11/15/21   Volanda Napoleon, MD  ferrous sulfate 325 (65 FE) MG tablet TAKE 325 MG BY MOUTH DAILY. 09/15/21   Saguier, Percell Miller, PA-C  furosemide (LASIX) 40 MG tablet Take 1 tablet (40 mg total)  by mouth daily. 02/06/22   Ann Held, DO  gabapentin (NEURONTIN) 100 MG capsule Take 1 capsule every night 08/29/21   Cameron Sprang, MD  omeprazole (PRILOSEC) 40 MG capsule Take 1 capsule (40 mg total) by mouth 2 (two) times daily. 11/27/21   Ann Held, DO  Vitamin D, Ergocalciferol, (DRISDOL) 1.25 MG (50000 UNIT) CAPS capsule Take 1 capsule (50,000 Units total) by mouth every 7 (seven) days. 11/29/21   Ann Held, DO      Allergies    Patient has no known allergies.    Review of Systems   Review of Systems  Respiratory:  Positive for cough.   All other systems reviewed and are negative.   Physical Exam Updated Vital Signs BP (!) 132/110   Pulse (!) 103   Temp 99.1 F (37.3 C) (Oral)   Resp 19   Ht '5\' 7"'$  (1.702 m)   Wt 117.9 kg   SpO2 100%   BMI 40.72 kg/m  Physical Exam Vitals and nursing note reviewed.  Constitutional:      General: She is not in acute distress.    Appearance: She is well-developed.  HENT:     Head: Normocephalic and atraumatic.     Ears:     Comments: Bilateral serous otitis media noted.  No obvious erythema of TM or external auditory canal.    Nose: Congestion and rhinorrhea present.  Eyes:     Extraocular Movements: Extraocular movements  intact.     Conjunctiva/sclera: Conjunctivae normal.     Pupils: Pupils are equal, round, and reactive to light.  Cardiovascular:     Rate and Rhythm: Normal rate and regular rhythm.     Pulses: Normal pulses.     Heart sounds: No murmur heard. Pulmonary:     Effort: Pulmonary effort is normal. No respiratory distress.     Breath sounds: Normal breath sounds. No wheezing or rales.  Abdominal:     Palpations: Abdomen is soft.     Tenderness: There is no abdominal tenderness. There is no right CVA tenderness or left CVA tenderness.  Musculoskeletal:        General: No swelling.     Cervical back: Normal range of motion and neck supple. No rigidity or tenderness.     Right lower  leg: No edema.     Left lower leg: No edema.  Skin:    General: Skin is warm and dry.     Capillary Refill: Capillary refill takes less than 2 seconds.  Neurological:     Mental Status: She is alert.  Psychiatric:        Mood and Affect: Mood normal.     ED Results / Procedures / Treatments   Labs (all labs ordered are listed, but only abnormal results are displayed) Labs Reviewed  RESP PANEL BY RT-PCR (FLU A&B, COVID) [id number]    EKG None  Radiology DG Chest 2 View  Result Date: 03/30/2022 CLINICAL DATA:  Productive cough EXAM: CHEST - 2 VIEW COMPARISON:  09/16/2021 FINDINGS: The heart size and mediastinal contours are within normal limits. No focal airspace consolidation, pleural effusion, or pneumothorax. Multifocal sclerotic bone lesions, as seen on multiple previous studies. IMPRESSION: No active cardiopulmonary disease. Electronically Signed   By: Davina Poke D.O.   On: 03/30/2022 21:10    Procedures Procedures    Medications Ordered in ED Medications  ibuprofen (ADVIL) tablet 600 mg (has no administration in time range)  guaiFENesin (ROBITUSSIN) 100 MG/5ML liquid 5 mL (5 mLs Oral Given 03/30/22 2226)  benzonatate (TESSALON) capsule 200 mg (200 mg Oral Given 03/30/22 2226)    ED Course/ Medical Decision Making/ A&P                           Medical Decision Making Amount and/or Complexity of Data Reviewed Radiology: ordered.  Risk Prescription drug management.   This patient presents to the ED for concern of flulike symptoms, this involves an extensive number of treatment options, and is a complaint that carries with it a high risk of complications and morbidity.  The differential diagnosis includes viral URI, influenza, COVID, pneumonia, GERD, aspiration pneumonia, meningitis,   Co morbidities that complicate the patient evaluation  See HPI   Additional history obtained:  Additional history obtained from EMR External records from outside source  obtained and reviewed including hospital records   Lab Tests:  I Ordered, and personally interpreted labs.  The pertinent results include: Negative respiratory viral panel with COVID, influenza.   Imaging Studies ordered:  I ordered imaging studies including chest x-ray I independently visualized and interpreted imaging which showed no acute cardiopulmonary process I agree with the radiologist interpretation   Cardiac Monitoring: / EKG:  The patient was maintained on a cardiac monitor.  I personally viewed and interpreted the cardiac monitored which showed an underlying rhythm of: Sinus rhythm   Consultations Obtained:  N/a   Problem List / ED  Course / Critical interventions / Medication management  URI I ordered medication including ibuprofen for pain, Robitussin and benzonatate for antitussive agents.    Reevaluation of the patient after these medicines showed that the patient improved I have reviewed the patients home medicines and have made adjustments as needed   Social Determinants of Health:  Denies tobacco, licit drug use   Test / Admission - Considered:  Viral URI Vitals signs significant for initially tachycardic with a rate of 103 which decreased with administration of medications while emergency department.. Otherwise within normal range and stable throughout visit. Laboratory/imaging studies significant for: See above Patient symptoms likely secondary to viral URI.  Patient given medicines in the emergency department notes significant improvement.  Continuation of outpatient medicines recommended.  No concern at this time for meningitis, pneumonia patient recommended follow-up with PCP in 3 to 5 days for reevaluation of symptoms.  Treatment plan discussed with patient she knowledge understanding agreeable to said plan. Worrisome signs and symptoms were discussed with the patient, and the patient acknowledged understanding to return to the ED if noticed.  Patient was stable upon discharge.  '        Final Clinical Impression(s) / ED Diagnoses Final diagnoses:  Viral URI with cough    Rx / DC Orders ED Discharge Orders          Ordered    ibuprofen (ADVIL) 600 MG tablet  Every 6 hours PRN        03/30/22 2214    dextromethorphan-guaiFENesin (TUSSIN DM) 10-100 MG/5ML liquid  Every 4 hours PRN        03/30/22 2230    cetirizine HCl (ZYRTEC) 1 MG/ML solution  Daily        03/30/22 2230    benzonatate (TESSALON) 100 MG capsule  Every 8 hours        03/30/22 2230              Wilnette Kales, [place] 03/30/22 2230    Hayden Rasmussen, MD 03/31/22 [phone]

## 2022-04-03 ENCOUNTER — Emergency Department (HOSPITAL_BASED_OUTPATIENT_CLINIC_OR_DEPARTMENT_OTHER)
Admission: EM | Admit: 2022-04-03 | Discharge: 2022-04-03 | Disposition: A | Discharge status: Home / Self Care | Payer: No Typology Code available for payment source | Attending: Emergency Medicine | Admitting: Emergency Medicine

## 2022-04-03 ENCOUNTER — Emergency Department (HOSPITAL_BASED_OUTPATIENT_CLINIC_OR_DEPARTMENT_OTHER): Payer: No Typology Code available for payment source

## 2022-04-03 ENCOUNTER — Encounter (HOSPITAL_BASED_OUTPATIENT_CLINIC_OR_DEPARTMENT_OTHER): Payer: Self-pay | Admitting: Pediatrics

## 2022-04-03 ENCOUNTER — Other Ambulatory Visit: Payer: Self-pay

## 2022-04-03 DIAGNOSIS — R42 Dizziness and giddiness: Secondary | ICD-10-CM | POA: Insufficient documentation

## 2022-04-03 DIAGNOSIS — R519 Headache, unspecified: Secondary | ICD-10-CM | POA: Insufficient documentation

## 2022-04-03 DIAGNOSIS — D649 Anemia, unspecified: Secondary | ICD-10-CM | POA: Insufficient documentation

## 2022-04-03 LAB — CBC
HCT: 33 % — ABNORMAL LOW (ref 36.0–46.0)
Hemoglobin: 11 g/dL — ABNORMAL LOW (ref 12.0–15.0)
MCH: 27.6 pg (ref 26.0–34.0)
MCHC: 33.3 g/dL (ref 30.0–36.0)
MCV: 82.7 fL (ref 80.0–100.0)
Platelets: 287 10*3/uL (ref 150–400)
RBC: 3.99 MIL/uL (ref 3.87–5.11)
RDW: 13.6 % (ref 11.5–15.5)
WBC: 7.4 10*3/uL (ref 4.0–10.5)
nRBC: 0 % (ref 0.0–0.2)

## 2022-04-03 LAB — URINALYSIS, ROUTINE W REFLEX MICROSCOPIC
Bilirubin Urine: NEGATIVE
Glucose, UA: NEGATIVE mg/dL
Hgb urine dipstick: NEGATIVE
Ketones, ur: NEGATIVE mg/dL
Leukocytes,Ua: NEGATIVE
Nitrite: NEGATIVE
Protein, ur: NEGATIVE mg/dL
Specific Gravity, Urine: 1.02 (ref 1.005–1.030)
pH: 7 (ref 5.0–8.0)

## 2022-04-03 LAB — BASIC METABOLIC PANEL
Anion gap: 6 (ref 5–15)
BUN: 8 mg/dL (ref 6–20)
CO2: 27 mmol/L (ref 22–32)
Calcium: 9.1 mg/dL (ref 8.9–10.3)
Chloride: 104 mmol/L (ref 98–111)
Creatinine, Ser: 0.62 mg/dL (ref 0.44–1.00)
GFR, Estimated: >60 mL/min (ref >60)
Glucose, Bld: 100 mg/dL — ABNORMAL HIGH (ref 70–99)
Potassium: 4.1 mmol/L (ref 3.5–5.1)
Sodium: 137 mmol/L (ref 135–145)

## 2022-04-03 LAB — PREGNANCY, URINE: Preg Test, Ur: NEGATIVE

## 2022-04-03 MED ORDER — KETOROLAC TROMETHAMINE 30 MG/ML IJ SOLN
30.0000 mg | Freq: Once | INTRAMUSCULAR | Status: AC
  Administered 2022-04-03: 30 mg via INTRAVENOUS
  Filled 2022-04-03: qty 1

## 2022-04-03 MED ORDER — LACTATED RINGERS BOLUS PEDS: 1000.0000 mL | Freq: Once | INTRAVENOUS | Status: DC

## 2022-04-03 MED ORDER — LACTATED RINGERS IV BOLUS
1000.0000 mL | Freq: Once | INTRAVENOUS | Status: AC
  Administered 2022-04-03: 1000 mL via INTRAVENOUS

## 2022-04-03 MED ORDER — DIAZEPAM 5 MG/ML IJ SOLN
2.5000 mg | Freq: Once | INTRAMUSCULAR | Status: AC
  Administered 2022-04-03: 2.5 mg via INTRAVENOUS
  Filled 2022-04-03: qty 2

## 2022-04-03 NOTE — ED Provider Notes (Signed)
[facility] EMERGENCY DEPARTMENT Provider Note   CSN: [id number] Arrival date & time: 04/03/22  0946     History  Chief Complaint  Patient presents with   Dizziness    Jodi Norman is a 26 y.o. female.  With PMH of anxiety, GERD, obesity with posterior headache and shoulder pain associated with lightheadedness.  Patient continues to complain of pain in the back of her neck and the back of her head that does not go down her arms since this morning.  It is sharp in nature.  She has no history of migraines but noted a bad headache yesterday.  It has gotten worse with time was not severe in onset.  She thinks she slept the wrong way in bed.  She has had a recent viral URI but denies any active fevers or vomiting.  She is not on her menstrual period and denies any active bleeding.  She notes that she will have intermittent dizziness episodes that are not like the world spinning around her but more like lightheaded rushes like she is going to faint.  However she has not fainted.  She has not taken anything for pain today.  She has been taking Best boy and thinks it may be contributing.  She has had no focal weakness, no loss of sensation, no numbness or tingling, no visual changes.  Dizziness      Home Medications Prior to Admission medications   Medication Sig Start Date End Date Taking? Authorizing Provider  benzonatate (TESSALON) 100 MG capsule Take 2 capsules (200 mg total) by mouth every 8 (eight) hours. 03/30/22   Wilnette Kales, PA  Biotin 10000 MCG TABS Take 10,000 mcg by mouth.    [provider]  celecoxib (CELEBREX) 100 MG capsule Take 1 capsule (100 mg total) by mouth 2 (two) times daily. 11/15/21   Volanda Napoleon, MD  cetirizine HCl (ZYRTEC) 1 MG/ML solution Take 10 mLs (10 mg total) by mouth daily. 03/30/22   Wilnette Kales, PA  dextromethorphan-guaiFENesin (TUSSIN DM) 10-100 MG/5ML liquid Take 5 mLs by mouth every 4 (four) hours as needed for  cough. 03/30/22   Dion Saucier A, PA  ferrous sulfate 325 (65 FE) MG tablet TAKE 325 MG BY MOUTH DAILY. 09/15/21   Saguier, Percell Miller, PA-C  furosemide (LASIX) 40 MG tablet Take 1 tablet (40 mg total) by mouth daily. 02/06/22   Ann Held, DO  gabapentin (NEURONTIN) 100 MG capsule Take 1 capsule every night 08/29/21   Cameron Sprang, MD  ibuprofen (ADVIL) 600 MG tablet Take 1 tablet (600 mg total) by mouth every 6 (six) hours as needed. 03/30/22   Wilnette Kales, PA  omeprazole (PRILOSEC) 40 MG capsule Take 1 capsule (40 mg total) by mouth 2 (two) times daily. 11/27/21   Ann Held, DO  Vitamin D, Ergocalciferol, (DRISDOL) 1.25 MG (50000 UNIT) CAPS capsule Take 1 capsule (50,000 Units total) by mouth every 7 (seven) days. 11/29/21   Ann Held, DO      Allergies    Patient has no known allergies.    Review of Systems   Review of Systems  Neurological:  Positive for dizziness.    Physical Exam Updated Vital Signs BP 110/62   Pulse 71   Temp 98.9 F (37.2 C) (Oral)   Resp 19   Ht '5\' 7"'$  (1.702 m)   Wt 117.9 kg   SpO2 100%   BMI 40.72 kg/m  Physical  Exam Constitutional: Alert and oriented. Well appearing and in no distress. Eyes: Conjunctivae are normal. ENT      Head: Normocephalic and atraumatic.      Nose: No congestion.      Mouth/Throat: Mucous membranes are moist.      Neck: No stridor. Ttp to right cervical paraspinal region and trapezius. No midline ttp, no stepoff or deformity. No meningismus. Cardiovascular: S1, S2,  Normal and symmetric distal pulses are present in all extremities.Warm and well perfused. Respiratory: Normal respiratory effort.  Gastrointestinal: Soft  Musculoskeletal: Normal range of motion in all extremities. Neurologic: Normal speech and language without aphasia. AAOx4. PERRL. EOMI without nystagmus. Face symmetrical without droop. CN II-XII intact. Normal facial sensation. Tongue midline.5/5 strength in upper and lower  extremities.  Normal sensation to light touch in all extremities. Normal gait. No focal neurologic deficits are appreciated. Skin: Skin is warm, dry and intact. No rash noted. Psychiatric: Mood and affect are normal. Speech and behavior are normal.  ED Results / Procedures / Treatments   Labs (all labs ordered are listed, but only abnormal results are displayed) Labs Reviewed  BASIC METABOLIC PANEL - Abnormal; Notable for the following components:      Result Value   Glucose, Bld 100 (*)    All other components within normal limits  CBC - Abnormal; Notable for the following components:   Hemoglobin 11.0 (*)    HCT 33.0 (*)    All other components within normal limits  URINALYSIS, ROUTINE W REFLEX MICROSCOPIC  PREGNANCY, URINE  CBG MONITORING, ED    EKG EKG Interpretation  Date/Time:  Tuesday April 03 2022 11:10:43 EST Ventricular Rate:  77 PR Interval:  130 QRS Duration: 90 QT Interval:  364 QTC Calculation: 411 R Axis:   61 Text Interpretation: Normal sinus rhythm Normal ECG When compared with ECG of 04-Jul-2021 15:55, PREVIOUS ECG IS PRESENT Confirmed by Georgina Snell [phone]) on 04/03/2022 11:29:27 AM  Radiology CT Head Wo Contrast  Result Date: 04/03/2022 CLINICAL DATA:  Headaches, dizziness EXAM: CT HEAD WITHOUT CONTRAST TECHNIQUE: Contiguous axial images were obtained from the base of the skull through the vertex without intravenous contrast. RADIATION DOSE REDUCTION: This exam was performed according to the departmental dose-optimization program which includes automated exposure control, adjustment of the mA and/or kV according to patient size and/or use of iterative reconstruction technique. COMPARISON:  MR brain done on 06/25/2020 FINDINGS: Brain: No acute intracranial findings are seen. There are no signs of bleeding within the cranium. Ventricles are not dilated. There is no focal edema or mass effect. Vascular: Unremarkable. Skull: Unremarkable. Sinuses/Orbits:  Unremarkable. Other: None. IMPRESSION: No acute intracranial findings are seen in noncontrast CT brain. Electronically Signed   By: Elmer Picker M.D.   On: 04/03/2022 12:07    Procedures Procedures    Medications Ordered in ED Medications  ketorolac (TORADOL) 30 MG/ML injection 30 mg (30 mg Intravenous Given 04/03/22 1253)  diazepam (VALIUM) injection 2.5 mg (2.5 mg Intravenous Given 04/03/22 1251)  lactated ringers bolus 1,000 mL (1,000 mLs Intravenous New Bag/Given 04/03/22 1241)    ED Course/ Medical Decision Making/ A&P Clinical Course as of 04/03/22 1339  Tue Apr 03, 2022  1337 Patient reassessed, she is feeling much better, advised continued supportive care with p.o. hydration and as needed Tylenol and ibuprofen.  Strict return precautions discussed and advised follow-up with PCP should.  She is in agreement with plan and safe for discharge. [VB]    Clinical Course User Index [VB]  Elgie Congo, MD                           Medical Decision Making Gabi N Mcmichen is a 26 y.o. female.  With PMH of anxiety, GERD, obesity with posterior headache and shoulder pain associated with lightheadedness.    Regarding lightheadedness, no concern for vertigo with no nystagmus, no spinning sensation and steady gait.  Consider also etiologies such as associated migraine, dehydration or acute electrolyte abnormality.  Labs are obtained and I personally reviewed with mild anemia hemoglobin 11 which is consistent with baseline and normal creatinine 0.62, no evidence of AKI.  Sodium 137 potassium 4.1.  UA with normal specific gravity and no evidence of UTI.  She is not pregnant.  Regarding the patient's headache, I suspect it is most consistent with primary headache in nature such as tension or migraine headache. I do not think CVA as the patient has no focal neurologic deficits. Additionally, I do not think SAH as the patient did not have severe onset of pain at beginning of headache, they do  not have focal neurologic deficits, and are overall non-toxic in appearance. I do not suspect meningitis with no illness symptoms, no rash, nontoxic appearance, and no meningismus on exam.  A CT head was obtained which I personally reviewed which showed no evidence of ICH or acute intracranial abnormality.  She was given IV fluids, IV Toradol and IV Valium with improvement.  Advised continued fluids, tylenol and ibuprofen. Advise close follow-up with her PCP and strict return precautions.    Amount and/or Complexity of Data Reviewed Labs: ordered. Radiology: ordered.  Risk Prescription drug management.    Final Clinical Impression(s) / ED Diagnoses Final diagnoses:  Dizziness  Anemia, unspecified type  Nonintractable headache, unspecified chronicity pattern, unspecified headache type    Rx / DC Orders ED Discharge Orders     None         Elgie Congo, MD 04/03/22 1339

## 2022-04-03 NOTE — Discharge Instructions (Signed)
You have been seen in the Emergency Department (ED) for a headache and dizziness. Your workup including CT of the head, blood-work, urine and EKG were all reassuring and did not give any definite cause of your symptoms but was all reassuring. Please use Tylenol or Motrin as needed for symptoms, but only as written on the box. You also need to drink PLENTY of water.  As we have discussed, please follow up with your primary care doctor as soon as possible, ideally within one week, regarding today's ED visit and your headache symptoms.    Call your doctor or return to the ED if you have a worsening headache, sudden and severe headache, confusion, slurred speech, facial droop, fainting spells, weakness or numbness in any arm or leg, extreme fatigue, or other symptoms that concern you.

## 2022-04-03 NOTE — ED Triage Notes (Signed)
C/O left arm pain, dizziness upon waking. Reports pain in the back of neck going up the back of the head;

## 2022-05-01 ENCOUNTER — Ambulatory Visit: Payer: No Typology Code available for payment source | Admitting: Family Medicine

## 2022-05-03 ENCOUNTER — Emergency Department (HOSPITAL_COMMUNITY)
Admission: EM | Admit: 2022-05-03 | Discharge: 2022-05-03 | Discharge status: Against Medical Advice | Payer: No Typology Code available for payment source | Attending: Physician Assistant | Admitting: Physician Assistant

## 2022-05-03 ENCOUNTER — Other Ambulatory Visit: Payer: Self-pay

## 2022-05-03 DIAGNOSIS — Z5321 Procedure and treatment not carried out due to patient leaving prior to being seen by health care provider: Secondary | ICD-10-CM | POA: Diagnosis not present

## 2022-05-03 DIAGNOSIS — R600 Localized edema: Secondary | ICD-10-CM | POA: Insufficient documentation

## 2022-05-03 LAB — CBC WITH DIFFERENTIAL/PLATELET
Abs Immature Granulocytes: 0.02 10*3/uL (ref 0.00–0.07)
Basophils Absolute: 0 10*3/uL (ref 0.0–0.1)
Basophils Relative: 0 %
Eosinophils Absolute: 0.1 10*3/uL (ref 0.0–0.5)
Eosinophils Relative: 2 %
HCT: 32 % — ABNORMAL LOW (ref 36.0–46.0)
Hemoglobin: 10.4 g/dL — ABNORMAL LOW (ref 12.0–15.0)
Immature Granulocytes: 0 %
Lymphocytes Relative: 43 %
Lymphs Abs: 2.9 10*3/uL (ref 0.7–4.0)
MCH: 27.3 pg (ref 26.0–34.0)
MCHC: 32.5 g/dL (ref 30.0–36.0)
MCV: 84 fL (ref 80.0–100.0)
Monocytes Absolute: 0.6 10*3/uL (ref 0.1–1.0)
Monocytes Relative: 9 %
Neutro Abs: 3.1 10*3/uL (ref 1.7–7.7)
Neutrophils Relative %: 46 %
Platelets: 273 10*3/uL (ref 150–400)
RBC: 3.81 MIL/uL — ABNORMAL LOW (ref 3.87–5.11)
RDW: 13.9 % (ref 11.5–15.5)
WBC: 6.8 10*3/uL (ref 4.0–10.5)
nRBC: 0 % (ref 0.0–0.2)

## 2022-05-03 LAB — BASIC METABOLIC PANEL
Anion gap: 8 (ref 5–15)
BUN: 10 mg/dL (ref 6–20)
CO2: 23 mmol/L (ref 22–32)
Calcium: 9.2 mg/dL (ref 8.9–10.3)
Chloride: 106 mmol/L (ref 98–111)
Creatinine, Ser: 0.85 mg/dL (ref 0.44–1.00)
GFR, Estimated: >60 mL/min (ref >60)
Glucose, Bld: 90 mg/dL (ref 70–99)
Potassium: 3.6 mmol/L (ref 3.5–5.1)
Sodium: 137 mmol/L (ref 135–145)

## 2022-05-03 NOTE — ED Provider Triage Note (Signed)
Emergency Medicine Provider Triage Evaluation Note  Jodi Norman , a 26 y.o. female  was evaluated in triage.  Pt complains of swelling both lower legs   Review of Systems  Positive: Swelling  Negative: Shortness of breath or chest pain  Physical Exam  BP 128/81   Pulse 81   Temp 98.7 F (37.1 C)   Resp 20   SpO2 100%  Gen:   Awake, no distress   Resp:  Normal effort  MSK:   Moves extremities without difficulty, bilat lower extremity edema Other:    Medical Decision Making  Medically screening exam initiated at 5:22 PM.  Appropriate orders placed.  Jodi Norman was informed that the remainder of the evaluation will be completed by another provider, this initial triage assessment does not replace that evaluation, and the importance of remaining in the ED until their evaluation is complete.     Fransico Meadow, [place] 05/03/22 1723

## 2022-05-03 NOTE — ED Triage Notes (Signed)
C/o left shoulder pain and bilateral leg pain that started today.

## 2022-05-08 ENCOUNTER — Ambulatory Visit (INDEPENDENT_AMBULATORY_CARE_PROVIDER_SITE_OTHER): Payer: Self-pay | Admitting: Family Medicine

## 2022-05-08 DIAGNOSIS — Z0289 Encounter for other administrative examinations: Secondary | ICD-10-CM

## 2022-05-09 NOTE — Progress Notes (Signed)
No show

## 2022-07-20 ENCOUNTER — Ambulatory Visit
Admission: EM | Admit: 2022-07-20 | Discharge: 2022-07-20 | Disposition: A | Discharge status: Home / Self Care | Payer: Commercial Managed Care - PPO

## 2022-07-20 ENCOUNTER — Ambulatory Visit (INDEPENDENT_AMBULATORY_CARE_PROVIDER_SITE_OTHER): Payer: Commercial Managed Care - PPO | Admitting: Family Medicine

## 2022-07-20 ENCOUNTER — Other Ambulatory Visit: Payer: Self-pay | Admitting: Family Medicine

## 2022-07-20 ENCOUNTER — Encounter: Payer: Self-pay | Admitting: Family Medicine

## 2022-07-20 ENCOUNTER — Telehealth: Payer: Self-pay | Admitting: *Deleted

## 2022-07-20 VITALS — BP 121/76 | HR 78 | Temp 99.5°F | Ht 67.0 in | Wt 262.0 lb

## 2022-07-20 DIAGNOSIS — H04123 Dry eye syndrome of bilateral lacrimal glands: Secondary | ICD-10-CM | POA: Diagnosis not present

## 2022-07-20 DIAGNOSIS — R682 Dry mouth, unspecified: Secondary | ICD-10-CM

## 2022-07-20 DIAGNOSIS — H04129 Dry eye syndrome of unspecified lacrimal gland: Secondary | ICD-10-CM

## 2022-07-20 LAB — VITAMIN D 25 HYDROXY (VIT D DEFICIENCY, FRACTURES): VITD: 9.66 ng/mL — ABNORMAL LOW (ref 30.00–100.00)

## 2022-07-20 LAB — COMPREHENSIVE METABOLIC PANEL
ALT: 12 U/L (ref 0–35)
AST: 16 U/L (ref 0–37)
Albumin: 4.1 g/dL (ref 3.5–5.2)
Alkaline Phosphatase: 101 U/L (ref 39–117)
BUN: 8 mg/dL (ref 6–23)
CO2: 25 mEq/L (ref 19–32)
Calcium: 9 mg/dL (ref 8.4–10.5)
Chloride: 104 mEq/L (ref 96–112)
Creatinine, Ser: 0.65 mg/dL (ref 0.40–1.20)
GFR: 121.63 mL/min (ref >60.00)
Glucose, Bld: 99 mg/dL (ref 70–99)
Potassium: 3.6 mEq/L (ref 3.5–5.1)
Sodium: 137 mEq/L (ref 135–145)
Total Bilirubin: 0.4 mg/dL (ref 0.2–1.2)
Total Protein: 7.3 g/dL (ref 6.0–8.3)

## 2022-07-20 LAB — CBC
HCT: 31.9 % — ABNORMAL LOW (ref 36.0–46.0)
Hemoglobin: 10.8 g/dL — ABNORMAL LOW (ref 12.0–15.0)
MCHC: 33.7 g/dL (ref 30.0–36.0)
MCV: 82.1 fl (ref 78.0–100.0)
Platelets: 301 10*3/uL (ref 150.0–400.0)
RBC: 3.89 Mil/uL (ref 3.87–5.11)
RDW: 15.5 % (ref 11.5–15.5)
WBC: 6.6 10*3/uL (ref 4.0–10.5)

## 2022-07-20 LAB — HEMOGLOBIN A1C: Hgb A1c MFr Bld: 6.1 % (ref 4.6–6.5)

## 2022-07-20 LAB — TSH: TSH: 0.94 u[IU]/mL (ref 0.35–5.50)

## 2022-07-20 MED ORDER — VITAMIN D (ERGOCALCIFEROL) 1.25 MG (50000 UNIT) PO CAPS: 50000.0000 [IU] | ORAL_CAPSULE | ORAL | 0 refills | Status: DC

## 2022-07-20 MED ORDER — PREDNISONE 20 MG PO TABS: 40.0000 mg | ORAL_TABLET | Freq: Every day | ORAL | 0 refills | Status: AC

## 2022-07-20 NOTE — ED Provider Notes (Signed)
[facility]    CSN: [id number] Arrival date & time: 07/20/22  1119      History   Chief Complaint Chief Complaint  Patient presents with   Eye Problem    HPI Jodi Norman is a 27 y.o. female presents for evaluation of dry eye and dry throat.  Patient reports 3 weeks of very dry eyes and a dry throat/mouth.  She states her throat is sore because of how dry it is.  Denies any URI symptoms.  She does have a history of sarcoidosis and positive ANA and is currently following with rheumatology.  Denies history of Sojourn's.  Does have GERD but states she has changed her diet which is controlled at greatly.  She only takes GERD medication intermittently.  Reports a negative OSA test recently.  Denies sleeping with her mouth open.  She has been using OTC lubricating eyedrops and sucking on lozenges with only temporary improvement.  She has an appointment with a rheumatologist in 4 days but states she cannot wait that long for treatment.  No fevers or chills.  No other concerns at this time.   Eye Problem   Past Medical History:  Diagnosis Date   Generalized anxiety disorder 06/24/2020   GERD (gastroesophageal reflux disease)     Patient Active Problem List   Diagnosis Date Noted   Positive ANA (antinuclear antibody) 02/07/2022   Myalgia 11/24/2021   Arthralgia 11/14/2021   Iron deficiency anemia 08/17/2021   GERD (gastroesophageal reflux disease)    Morbid obesity ([place]) 06/05/2021   History of scoliosis 03/06/2021   Chronic midline back pain 03/06/2021   Bone lesion 10/27/2020   Daytime somnolence 07/11/2020   Other fatigue [id number]   Metabolic syndrome [id number]   History of pre-eclampsia  07/11/2020   Obesity (BMI 30-39.9) 07/11/2020   Insomnia 07/08/2020   Intractable migraine without aura and without status migrainosus 07/08/2020   Chest pain 07/08/2020   Generalized anxiety disorder 06/24/2020   Carpal tunnel syndrome, bilateral 05/07/2018   Cesarean  delivery delivered 04/28/2018   Right hand paresthesia 06/19/2017    Past Surgical History:  Procedure Laterality Date   CESAREAN SECTION     MOUTH SURGERY  05/2021   UPPER GI ENDOSCOPY  04/27/2021    OB History     Gravida  1   Para      Term      Preterm      AB      Living         SAB      IAB      Ectopic      Multiple      Live Births               Home Medications    Prior to Admission medications   Medication Sig Start Date End Date Taking? Authorizing Provider  Multiple Vitamin (MULTIVITAMIN) capsule Take 1 capsule by mouth daily.   Yes [provider]  benzonatate (TESSALON) 100 MG capsule Take 2 capsules (200 mg total) by mouth every 8 (eight) hours. 03/30/22   Wilnette Kales, PA  Biotin 10000 MCG TABS Take 10,000 mcg by mouth.    [provider]  celecoxib (CELEBREX) 100 MG capsule Take 1 capsule (100 mg total) by mouth 2 (two) times daily. 11/15/21   Volanda Napoleon, MD  cetirizine HCl (ZYRTEC) 1 MG/ML solution Take 10 mLs (10 mg total) by mouth daily. 03/30/22   Wilnette Kales, PA  dextromethorphan-guaiFENesin (TUSSIN DM) 10-100 MG/5ML liquid Take 5 mLs by mouth every 4 (four) hours as needed for cough. 03/30/22   Dion Saucier A, PA  ferrous sulfate 325 (65 FE) MG tablet TAKE 325 MG BY MOUTH DAILY. 09/15/21   Saguier, Percell Miller, PA-C  furosemide (LASIX) 40 MG tablet Take 1 tablet (40 mg total) by mouth daily. 02/06/22   Ann Held, DO  gabapentin (NEURONTIN) 100 MG capsule Take 1 capsule every night 08/29/21   Cameron Sprang, MD  ibuprofen (ADVIL) 600 MG tablet Take 1 tablet (600 mg total) by mouth every 6 (six) hours as needed. 03/30/22   Wilnette Kales, PA  omeprazole (PRILOSEC) 40 MG capsule Take 1 capsule (40 mg total) by mouth 2 (two) times daily. 11/27/21   Ann Held, DO  Vitamin D, Ergocalciferol, (DRISDOL) 1.25 MG (50000 UNIT) CAPS capsule Take 1 capsule (50,000 Units total) by mouth every 7  (seven) days. 11/29/21   Ann Held, DO    Family History Family History  Problem Relation Age of Onset   Hyperlipidemia Mother    Hypertension Mother    Hyperlipidemia Father    Hypertension Father    Diabetes Father    Colon cancer Neg Hx    Stomach cancer Neg Hx    Esophageal cancer Neg Hx    Pancreatic cancer Neg Hx    Colon polyps Neg Hx    Rectal cancer Neg Hx     Social History Social History   Tobacco Use   Smoking status: Never   Smokeless tobacco: Never  Vaping Use   Vaping Use: Never used  Substance Use Topics   Alcohol use: Never   Drug use: Never     Allergies   Patient has no known allergies.   Review of Systems Review of Systems  HENT:         Dry mouth/throat  Eyes:        Dry eyes     Physical Exam Triage Vital Signs ED Triage Vitals  Enc Vitals Group     BP 07/20/22 1128 (!) 140/86     Pulse Rate 07/20/22 1128 80     Resp 07/20/22 1128 18     Temp 07/20/22 1128 98.1 F (36.7 C)     Temp Source 07/20/22 1128 Oral     SpO2 07/20/22 1128 97 %     Weight --      Height --      Head Circumference --      Peak Flow --      Pain Score 07/20/22 1132 6     Pain Loc --      Pain Edu? --      Excl. in [place]? --    No data found.  Updated Vital Signs BP (!) 140/86 (BP Location: Right Wrist)   Pulse 80   Temp 98.1 F (36.7 C) (Oral)   Resp 18   LMP 06/29/2022 (Exact Date)   SpO2 97%   Visual Acuity Right Eye Distance:   Left Eye Distance:   Bilateral Distance:    Right Eye Near:   Left Eye Near:    Bilateral Near:     Physical Exam Vitals and nursing note reviewed.  Constitutional:      General: She is not in acute distress.    Appearance: She is well-developed. She is not ill-appearing.  HENT:     Head: Normocephalic and atraumatic.     Right Ear:  Tympanic membrane and ear canal normal.     Left Ear: Tympanic membrane and ear canal normal.     Nose: No congestion or rhinorrhea.     Mouth/Throat:     Mouth:  Mucous membranes are moist.     Pharynx: Oropharynx is clear. Uvula midline. No oropharyngeal exudate or posterior oropharyngeal erythema.     Tonsils: No tonsillar exudate or tonsillar abscesses.  Eyes:     General: Lids are normal.        Right eye: No foreign body, discharge or hordeolum.        Left eye: No foreign body, discharge or hordeolum.     Conjunctiva/sclera:     Right eye: Right conjunctiva is not injected. No chemosis, exudate or hemorrhage.    Left eye: Left conjunctiva is not injected. No chemosis, exudate or hemorrhage.    Pupils: Pupils are equal, round, and reactive to light.  Cardiovascular:     Rate and Rhythm: Normal rate and regular rhythm.     Heart sounds: Normal heart sounds.  Pulmonary:     Effort: Pulmonary effort is normal.     Breath sounds: Normal breath sounds.  Musculoskeletal:     Cervical back: Normal range of motion and neck supple.  Lymphadenopathy:     Cervical: No cervical adenopathy.  Skin:    General: Skin is warm and dry.  Neurological:     General: No focal deficit present.     Mental Status: She is alert and oriented to person, place, and time.  Psychiatric:        Mood and Affect: Mood normal.        Behavior: Behavior normal.      UC Treatments / Results  Labs (all labs ordered are listed, but only abnormal results are displayed) Labs Reviewed - No data to display  EKG   Radiology No results found.  Procedures Procedures (including critical care time)  Medications Ordered in UC Medications - No data to display  Initial Impression / Assessment and Plan / UC Course  I have reviewed the triage vital signs and the nursing notes.  Pertinent labs & imaging results that were available during my care of the patient were reviewed by me and considered in my medical decision making (see chart for details).     Symptoms with patient.  Discussed abilities and limitations of urgent care.  Advised persistent sore throat could be  a sign of mono but patient declined testing.  Also declined strep testing.  Discussed symptoms seem consistent with Sojourn's and this will require rheumatology workup.  She asked for ENT as well.  Referred to ENT and ophthalmology for further treatment options Continue over-the-counter lubricating eyedrops, sucking on hard candies, hydration Should follow-up with rheumatology as scheduled appointment in 4 days ER precautions reviewed and she verbalized understanding Final Clinical Impressions(s) / UC Diagnoses   Final diagnoses:  Dry eye  Dry mouth   Discharge Instructions   None    ED Prescriptions   None    PDMP not reviewed this encounter.   Melynda Ripple, NP 07/20/22 [phone]

## 2022-07-20 NOTE — ED Triage Notes (Signed)
Pt reports sore throat, dry throat, dry eyes x 3 weeks. Sore throat is worse at night.

## 2022-07-20 NOTE — Patient Instructions (Signed)
Artificial tears like Refresh and Systane may be used for comfort. OK to get generic version. Generally people use them every 2-4 hours, but you can use them as much as you want because there is no medication in it.  Stay hydrated.  Consider Biotene mouthwash to help stimulate saliva.  Let us know if you need anything.

## 2022-07-20 NOTE — Telephone Encounter (Signed)
Pt was sent through the triage line.  She stated she was on the phone with someone and got cut off.  Advised about lab work and that she needs to take vit d as prescribed by provider.

## 2022-07-20 NOTE — Progress Notes (Signed)
Chief Complaint  Patient presents with   Dry Eye    Throat dry    Subjective: Patient is a 27 y.o. female here for dry eyes/mouth.  Over the past month, the patient has had issues with dry mouth, sore throat, and dry eyes.  She has been using various over-the-counter treatment options without relief.  She is to frequently use artificial tears.  She has used Biotene mouthwash without relief.  She has difficulty swallowing due to lack of saliva.  She has been drinking lots of water.  No pain with urination.  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  Nothing new.  No trauma.  No change in diet.  She does not take any medicines routinely.  No new supplements or over-the-counter medications.  Past Medical History:  Diagnosis Date   Generalized anxiety disorder 06/24/2020   GERD (gastroesophageal reflux disease)     Objective: BP 121/76 (BP Location: Left Arm, Patient Position: Sitting, Cuff Size: Large)   Pulse 78   Temp 99.5 F (37.5 C) (Oral)   Ht '5\' 7"'$  (1.702 m)   Wt 262 lb (118.8 kg)   LMP 06/29/2022 (Exact Date)   SpO2 97%   BMI 41.04 kg/m  General: Awake, appears stated age Heart: RRR, no LE edema Mouth: MMM, no mucosal lesions, appropriate pooled saliva on the floor of the mouth Eyes: PERRLA, slight injection of sclera bilaterally, no drainage Lungs: CTAB, no rales, wheezes or rhonchi. No accessory muscle use Psych: Age appropriate judgment and insight, normal affect and mood  Assessment and Plan: Dry mouth and eyes - Plan: Antinuclear Antib (ANA), Sjogrens syndrome-A extractable nuclear antibody, Sjogrens syndrome-B extractable nuclear antibody, Anti-Smith antibody, Anti-DNA antibody, double-stranded, Cyclic citrul peptide antibody, IgG (QUEST), Aldolase, CBC, Comprehensive metabolic panel, TSH, VITAMIN D 25 Hydroxy (Vit-D Deficiency, Fractures), Hemoglobin A1c, predniSONE (DELTASONE) 20 MG tablet  5 d pred burst to see if this helps w AI issues. Ck above  labs. Stay hydrated.  Artificial tears.  Continue Biotene mouthwash.  She has an appointment with a rheumatologist in 5 days. The patient voiced understanding and agreement to the plan.  [place], DO 07/20/22  2:58 PM

## 2022-07-21 LAB — ANTI-SMITH ANTIBODY: ENA SM Ab Ser-aCnc: <1 AI

## 2022-07-23 LAB — ALDOLASE: Aldolase: 4.1 U/L (ref <=8.1)

## 2022-07-24 LAB — CYCLIC CITRUL PEPTIDE ANTIBODY, IGG: Cyclic Citrullin Peptide Ab: <16 UNITS

## 2022-07-24 LAB — SJOGRENS SYNDROME-A EXTRACTABLE NUCLEAR ANTIBODY: SSA (Ro) (ENA) Antibody, IgG: <1 AI

## 2022-07-24 LAB — ANTI-NUCLEAR AB-TITER (ANA TITER): ANA Titer 1: 1:40 {titer} — ABNORMAL HIGH

## 2022-07-24 LAB — SJOGRENS SYNDROME-B EXTRACTABLE NUCLEAR ANTIBODY: SSB (La) (ENA) Antibody, IgG: <1 AI

## 2022-07-24 LAB — ANTI-DNA ANTIBODY, DOUBLE-STRANDED: ds DNA Ab: <1 IU/mL

## 2022-07-24 LAB — ANA: Anti Nuclear Antibody (ANA): POSITIVE — AB

## 2022-07-25 ENCOUNTER — Telehealth: Payer: Self-pay | Admitting: Family Medicine

## 2022-07-25 NOTE — Telephone Encounter (Signed)
Pt will be faxing over surgery clearance for weight loss surgery. She also wanted to schedule her physical for this year but patient requested after 4pm. Advised pt we do not typically schedule physicals after 3pm. Pt said she just had labs with Dr. Nani Ravens so she should not need labs again. Please call to advise if okay to schedule CPE after 4pm for patient.

## 2022-07-31 NOTE — Telephone Encounter (Signed)
[place] okayed CPE on a Tuesday evening and advised that [facility] did not do CPE labs.

## 2022-08-07 ENCOUNTER — Telehealth: Payer: Commercial Managed Care - PPO | Admitting: Family Medicine

## 2022-08-07 DIAGNOSIS — Z3009 Encounter for other general counseling and advice on contraception: Secondary | ICD-10-CM

## 2022-08-07 NOTE — Progress Notes (Signed)
[place]   Request plan B- needs in person for this

## 2022-08-08 ENCOUNTER — Other Ambulatory Visit: Payer: Self-pay | Admitting: Family Medicine

## 2022-08-08 MED ORDER — LEVONORGESTREL 1.5 MG PO TABS: 1.5000 mg | ORAL_TABLET | Freq: Once | ORAL | 0 refills | Status: AC

## 2022-08-13 ENCOUNTER — Encounter: Payer: Self-pay | Admitting: Family Medicine

## 2022-08-13 NOTE — Telephone Encounter (Signed)
Appt scheduled , pls call to get her scheduled for follow up

## 2022-08-14 ENCOUNTER — Encounter: Payer: Commercial Managed Care - PPO | Admitting: Family Medicine

## 2022-08-14 NOTE — Telephone Encounter (Signed)
Pt rescheduled

## 2022-08-21 ENCOUNTER — Ambulatory Visit (INDEPENDENT_AMBULATORY_CARE_PROVIDER_SITE_OTHER): Payer: Commercial Managed Care - PPO | Admitting: Family Medicine

## 2022-08-21 ENCOUNTER — Other Ambulatory Visit (HOSPITAL_COMMUNITY)
Admission: RE | Admit: 2022-08-21 | Discharge: 2022-08-21 | Disposition: A | Discharge status: Home / Self Care | Payer: Commercial Managed Care - PPO | Source: Ambulatory Visit | Attending: Family Medicine | Admitting: Family Medicine

## 2022-08-21 ENCOUNTER — Encounter: Payer: Self-pay | Admitting: Family Medicine

## 2022-08-21 VITALS — BP 126/80 | HR 82 | Temp 98.4°F | Resp 16 | Ht 67.0 in | Wt 261.2 lb

## 2022-08-21 DIAGNOSIS — Z7251 High risk heterosexual behavior: Secondary | ICD-10-CM | POA: Diagnosis not present

## 2022-08-21 DIAGNOSIS — R82998 Other abnormal findings in urine: Secondary | ICD-10-CM | POA: Diagnosis not present

## 2022-08-21 DIAGNOSIS — F410 Panic disorder [episodic paroxysmal anxiety] without agoraphobia: Secondary | ICD-10-CM

## 2022-08-21 DIAGNOSIS — N760 Acute vaginitis: Secondary | ICD-10-CM | POA: Diagnosis not present

## 2022-08-21 DIAGNOSIS — F411 Generalized anxiety disorder: Secondary | ICD-10-CM

## 2022-08-21 DIAGNOSIS — Z0000 Encounter for general adult medical examination without abnormal findings: Secondary | ICD-10-CM | POA: Diagnosis not present

## 2022-08-21 LAB — POC URINALSYSI DIPSTICK (AUTOMATED)
Bilirubin, UA: NEGATIVE
Blood, UA: NEGATIVE
Glucose, UA: NEGATIVE
Nitrite, UA: NEGATIVE
Protein, UA: NEGATIVE
Spec Grav, UA: >=1.03 — AB (ref 1.010–1.025)
Urobilinogen, UA: 0.2 E.U./dL
pH, UA: 6 (ref 5.0–8.0)

## 2022-08-21 MED ORDER — LORAZEPAM 0.5 MG PO TABS: 0.5000 mg | ORAL_TABLET | Freq: Three times a day (TID) | ORAL | 0 refills | Status: DC

## 2022-08-21 NOTE — Progress Notes (Signed)
Subjective:   By signing my name below, I, Shehryar Baig, attest that this documentation has been prepared under the direction and in the presence of Ann Held, DO. 08/21/2022   Patient ID: Jodi Norman, female    DOB: 11/12/1995, 27 y.o.   MRN: [id number]  Chief Complaint  Patient presents with   STD CHECK    HPI Patient is in today for a office visit.   She complains of pain while urinating. She is requesting for a STD check and pregnancy test. She has taken plan B following her latest sexual encounter. Her menstrual cycle is late at this time. Her last menstrual cycle was last month and was normal. She denies having any discharge, abdominal pain, or vaginal itching at this time.  She is requesting to resume taking medication to help her anxiety. She has tried counseling but could not afford using it for a prolonged period.   Past Medical History:  Diagnosis Date   Generalized anxiety disorder 06/24/2020   GERD (gastroesophageal reflux disease)     Past Surgical History:  Procedure Laterality Date   CESAREAN SECTION     MOUTH SURGERY  05/2021   UPPER GI ENDOSCOPY  04/27/2021    Family History  Problem Relation Age of Onset   Hyperlipidemia Mother    Hypertension Mother    Hyperlipidemia Father    Hypertension Father    Diabetes Father    Colon cancer Neg Hx    Stomach cancer Neg Hx    Esophageal cancer Neg Hx    Pancreatic cancer Neg Hx    Colon polyps Neg Hx    Rectal cancer Neg Hx     Social History   Socioeconomic History   Marital status: Single    Spouse name: Not on file   Number of children: Not on file   Years of education: Not on file   Highest education level: Not on file  Occupational History   Occupation: Psychologist, occupational: [employer]  Tobacco Use   Smoking status: Never   Smokeless tobacco: Never  Vaping Use   Vaping Use: Never used  Substance and Sexual Activity   Alcohol use: Never   Drug use: Never    Sexual activity: Not Currently    Birth control/protection: None  Other Topics Concern   Not on file  Social History Narrative   Right handed   Social Determinants of Health   Financial Resource Strain: Not on file  Food Insecurity: Not on file  Transportation Needs: Not on file  Physical Activity: Not on file  Stress: Not on file  Social Connections: Not on file  Intimate Partner Violence: Not on file    Outpatient Medications Prior to Visit  Medication Sig Dispense Refill   Multiple Vitamin (MULTIVITAMIN) capsule Take 1 capsule by mouth daily.     omeprazole (PRILOSEC) 40 MG capsule Take 1 capsule (40 mg total) by mouth 2 (two) times daily. 90 capsule 3   Vitamin D, Ergocalciferol, (DRISDOL) 1.25 MG (50000 UNIT) CAPS capsule Take 1 capsule (50,000 Units total) by mouth every 7 (seven) days. 12 capsule 0   No facility-administered medications prior to visit.    No Known Allergies  Review of Systems  Gastrointestinal:  Negative for abdominal pain.  Genitourinary:        (-)vaginal discharge (+)pain while urinating (-)vaginal itching       Objective:    Physical Exam  BP 126/80  Pulse 82   Temp 98.4 F (36.9 C) (Oral)   Resp 16   Ht 5\' 7"  (1.702 m)   Wt 261 lb 4 oz (118.5 kg)   LMP 07/24/2022 (Approximate)   SpO2 95%   BMI 40.92 kg/m  Wt Readings from Last 3 Encounters:  08/21/22 261 lb 4 oz (118.5 kg)  07/20/22 262 lb (118.8 kg)  04/03/22 260 lb (117.9 kg)       Assessment & Plan:  There are no diagnoses linked to this encounter.  I, Shehryar [place], personally preformed the services described in this documentation.  All medical record entries made by the scribe were at my direction and in my presence.  I have reviewed the chart and discharge instructions (if applicable) and agree that the record reflects my personal performance and is accurate and complete. 08/21/2022   I,Shehryar Baig,acting as a scribe for Ann Held, DO.,have documented  all relevant documentation on the behalf of Ann Held, DO,as directed by  Ann Held, DO while in the presence of Ann Held, DO.   Shehryar [employer]

## 2022-08-21 NOTE — Patient Instructions (Signed)
Preventing Sexually Transmitted Infections, Adult Sexually transmitted infections (STIs) are spread from person to person (are contagious). They are spread, or transmitted, during sex. The sex may be vaginal, anal, or oral. STIs can be passed during sexual contact with skin, genitals, mouth, or rectum. They may spread through body fluids, such as saliva, semen, blood, vaginal mucus, and urine. STIs are very common. They can happen in people of all ages. Some common STIs are: Herpes. Hepatitis B. Chlamydia. Gonorrhea. Syphilis. Trichomoniasis. Human papillomavirus (HPV). Human immunodeficiency virus (HIV). This can cause acquired immunodeficiency syndrome (AIDS). How can STIs affect me? You may not have symptoms with an STI. Even if you do not have symptoms, you can still spread the infection to others. You also still need treatment. STIs can be treated. Some STIs can be cured. Other STIs cannot be cured and will affect you for the rest of your life. Certain STIs may: Require you to take medicine for the rest of your life. Affect your ability to have children. Increase your risk for getting other STIs. Increase your risk of getting certain conditions. These may include: Cervical cancer. Pelvic inflammatory disease (PID). Organ damage or damage to other parts of your body. This can happen if the infection spreads. Cause problems during pregnancy. STIs may be spread to the baby during pregnancy or birth. Females tend to have more severe problems from STIs than males. What can increase my risk? You may be more at risk for an STI if: You do not use protection during sex. You have more than one sex partner. You have a sex partner who has other sex partners. You have sex with a person who has an STI. You have an STI, or you have had an STI before. You inject drugs or have a sex partner who injects drugs. What actions can I take to prevent STIs? The only way to fully prevent STIs is not to  have sex of any kind. This is called practicing abstinence. If you are sexually active, you can protect yourself and others by taking these actions to lower your risk of getting an STI: Lifestyle Have only one sex partner or limit the number of sex partners you have. Avoid having sex after you have alcohol or drugs. Alcohol and drugs can affect your ability to make good choices. This can lead to risky sexual behaviors. Go to prevention counseling. This can teach you how to avoid getting an STI. Barrier protection  Use methods to stop body fluids from being exchanged between partners during sex (barrier protection). These methods can be used during oral, vaginal, or anal sex. They include: External condom, for males. Internal condom, for females. Dental dam. Use a new barrier method for every sex act from start to finish. Know that a barrier method may not protect you from all STIs. Some STIs, such as herpes, are spread through skin-to-skin contact. Avoid all sexual contact if you or a partner has herpes and there is an active flare with open sores. Birth control pills, injections, implants, and intrauterine devices (IUDs) do not protect against STIs. To prevent both STIs and pregnancy, always use a condom with a second form of birth control. General information Ask your health care provider about taking pre-exposure prophylaxis (PrEP) to prevent HIV. Stay up to date on your vaccines. Some vaccines can lower your risk of getting certain STIs. These include: Hepatitis B vaccine. HPV vaccine. This is recommended for people up to age 26. Get tested for STIs. Have your   partners get tested, too. If you test positive for an STI, follow recommendations from your health care provider about treatment. Make sure your sex partners are tested and treated as well. Where to find more information Learn more about STIs from: Centers for Disease Control and Prevention (CDC): More information about certain  STIs: cdc.gov Places to get sexual health counseling and treatment for free or at a low cost: gettested.cdc.gov U.S. Department of Health and Human Services (HHS): womenshealth.gov This information is not intended to replace advice given to you by your health care provider. Make sure you discuss any questions you have with your health care provider. Document Revised: 12/26/2021 Document Reviewed: 10/27/2021 Elsevier Patient Education  2023 Elsevier Inc.  

## 2022-08-22 ENCOUNTER — Other Ambulatory Visit: Payer: Commercial Managed Care - PPO

## 2022-08-22 DIAGNOSIS — N760 Acute vaginitis: Secondary | ICD-10-CM | POA: Insufficient documentation

## 2022-08-22 DIAGNOSIS — Z7251 High risk heterosexual behavior: Secondary | ICD-10-CM | POA: Insufficient documentation

## 2022-08-22 DIAGNOSIS — F410 Panic disorder [episodic paroxysmal anxiety] without agoraphobia: Secondary | ICD-10-CM | POA: Insufficient documentation

## 2022-08-22 LAB — CBC WITH DIFFERENTIAL/PLATELET
Basophils Absolute: 0.1 10*3/uL (ref 0.0–0.1)
Basophils Relative: 0.7 % (ref 0.0–3.0)
Eosinophils Absolute: 0.1 10*3/uL (ref 0.0–0.7)
Eosinophils Relative: 1.6 % (ref 0.0–5.0)
HCT: 33.4 % — ABNORMAL LOW (ref 36.0–46.0)
Hemoglobin: 11.2 g/dL — ABNORMAL LOW (ref 12.0–15.0)
Lymphocytes Relative: 37.8 % (ref 12.0–46.0)
Lymphs Abs: 2.9 10*3/uL (ref 0.7–4.0)
MCHC: 33.5 g/dL (ref 30.0–36.0)
MCV: 83.9 fl (ref 78.0–100.0)
Monocytes Absolute: 0.6 10*3/uL (ref 0.1–1.0)
Monocytes Relative: 7.4 % (ref 3.0–12.0)
Neutro Abs: 4 10*3/uL (ref 1.4–7.7)
Neutrophils Relative %: 52.5 % (ref 43.0–77.0)
Platelets: 377 10*3/uL (ref 150.0–400.0)
RBC: 3.98 Mil/uL (ref 3.87–5.11)
RDW: 15.9 % — ABNORMAL HIGH (ref 11.5–15.5)
WBC: 7.6 10*3/uL (ref 4.0–10.5)

## 2022-08-22 LAB — COMPREHENSIVE METABOLIC PANEL
ALT: 11 U/L (ref 0–35)
AST: 15 U/L (ref 0–37)
Albumin: 4.4 g/dL (ref 3.5–5.2)
Alkaline Phosphatase: 94 U/L (ref 39–117)
BUN: 7 mg/dL (ref 6–23)
CO2: 26 mEq/L (ref 19–32)
Calcium: 9.1 mg/dL (ref 8.4–10.5)
Chloride: 103 mEq/L (ref 96–112)
Creatinine, Ser: 0.78 mg/dL (ref 0.40–1.20)
GFR: 104.86 mL/min (ref >60.00)
Glucose, Bld: 85 mg/dL (ref 70–99)
Potassium: 3.6 mEq/L (ref 3.5–5.1)
Sodium: 137 mEq/L (ref 135–145)
Total Bilirubin: 0.2 mg/dL (ref 0.2–1.2)
Total Protein: 8.1 g/dL (ref 6.0–8.3)

## 2022-08-22 LAB — LIPID PANEL
Cholesterol: 143 mg/dL (ref 0–200)
HDL: 35.2 mg/dL — ABNORMAL LOW (ref >39.00)
LDL Cholesterol: 84 mg/dL (ref 0–99)
NonHDL: 107.82
Total CHOL/HDL Ratio: 4
Triglycerides: 117 mg/dL (ref 0.0–149.0)
VLDL: 23.4 mg/dL (ref 0.0–40.0)

## 2022-08-22 LAB — HIV ANTIBODY (ROUTINE TESTING W REFLEX): HIV 1&2 Ab, 4th Generation: NONREACTIVE

## 2022-08-22 LAB — HSV(HERPES SIMPLEX VRS) I + II AB-IGG
HAV 1 IGG,TYPE SPECIFIC AB: >58 index — ABNORMAL HIGH
HSV 2 IGG,TYPE SPECIFIC AB: <0.9 index

## 2022-08-22 LAB — RPR: RPR Ser Ql: NONREACTIVE

## 2022-08-22 LAB — TSH: TSH: 1.27 u[IU]/mL (ref 0.35–5.50)

## 2022-08-22 NOTE — Assessment & Plan Note (Signed)
Swab done  Check std in blood as well

## 2022-08-22 NOTE — Assessment & Plan Note (Signed)
Xanax refilled for as needed use only

## 2022-08-23 LAB — HEPB+HEPC+HIV PANEL
HIV Screen 4th Generation wRfx: NONREACTIVE
Hep B C IgM: NEGATIVE
Hep B Core Total Ab: NEGATIVE
Hep B E Ab: NEGATIVE
Hep B E Ag: NEGATIVE
Hep B Surface Ab, Qual: NONREACTIVE
Hep C Virus Ab: NONREACTIVE
Hepatitis B Surface Ag: NEGATIVE

## 2022-08-24 LAB — NUSWAB VAGINITIS PLUS (VG+)
Candida albicans, NAA: POSITIVE — AB
Candida glabrata, NAA: NEGATIVE
Chlamydia trachomatis, NAA: NEGATIVE
Neisseria gonorrhoeae, NAA: NEGATIVE
Trich vag by NAA: NEGATIVE

## 2022-08-24 LAB — CERVICOVAGINAL ANCILLARY ONLY
Bacterial Vaginitis-Urine: NEGATIVE
Candida Urine: POSITIVE — AB
Chlamydia: NEGATIVE
Comment: NEGATIVE
Comment: NEGATIVE
Comment: NORMAL
Neisseria Gonorrhea: NEGATIVE
Trichomonas: NEGATIVE

## 2022-08-24 LAB — URINE CULTURE
MICRO NUMBER:: 14748993
SPECIMEN QUALITY:: ADEQUATE

## 2022-08-25 ENCOUNTER — Other Ambulatory Visit: Payer: Self-pay | Admitting: Family Medicine

## 2022-08-25 DIAGNOSIS — N390 Urinary tract infection, site not specified: Secondary | ICD-10-CM

## 2022-08-25 DIAGNOSIS — N760 Acute vaginitis: Secondary | ICD-10-CM

## 2022-08-25 MED ORDER — CEPHALEXIN 500 MG PO CAPS: 500.0000 mg | ORAL_CAPSULE | Freq: Two times a day (BID) | ORAL | 0 refills | Status: DC

## 2022-08-25 MED ORDER — FLUCONAZOLE 150 MG PO TABS: 150.0000 mg | ORAL_TABLET | Freq: Every day | ORAL | 0 refills | Status: DC

## 2022-09-11 ENCOUNTER — Encounter: Payer: Commercial Managed Care - PPO | Admitting: Family Medicine

## 2022-09-12 ENCOUNTER — Encounter: Payer: Self-pay | Admitting: *Deleted

## 2022-10-02 ENCOUNTER — Encounter: Payer: Commercial Managed Care - PPO | Admitting: Family Medicine

## 2022-10-02 ENCOUNTER — Encounter: Payer: Self-pay | Admitting: *Deleted

## 2022-11-29 ENCOUNTER — Other Ambulatory Visit: Payer: Self-pay | Admitting: Family Medicine

## 2022-11-29 DIAGNOSIS — N760 Acute vaginitis: Secondary | ICD-10-CM

## 2023-01-07 ENCOUNTER — Emergency Department (HOSPITAL_BASED_OUTPATIENT_CLINIC_OR_DEPARTMENT_OTHER)
Admission: EM | Admit: 2023-01-07 | Discharge: 2023-01-08 | Disposition: A | Discharge status: Home / Self Care | Payer: Commercial Managed Care - PPO | Attending: Emergency Medicine | Admitting: Emergency Medicine

## 2023-01-07 ENCOUNTER — Other Ambulatory Visit: Payer: Self-pay

## 2023-01-07 DIAGNOSIS — R0781 Pleurodynia: Secondary | ICD-10-CM | POA: Insufficient documentation

## 2023-01-07 LAB — COMPREHENSIVE METABOLIC PANEL
ALT: 14 U/L (ref 0–44)
AST: 26 U/L (ref 15–41)
Albumin: 3.9 g/dL (ref 3.5–5.0)
Alkaline Phosphatase: 81 U/L (ref 38–126)
Anion gap: 9 (ref 5–15)
BUN: 9 mg/dL (ref 6–20)
CO2: 23 mmol/L (ref 22–32)
Calcium: 9.1 mg/dL (ref 8.9–10.3)
Chloride: 104 mmol/L (ref 98–111)
Creatinine, Ser: 0.78 mg/dL (ref 0.44–1.00)
GFR, Estimated: >60 mL/min (ref >60)
Glucose, Bld: 96 mg/dL (ref 70–99)
Potassium: 3.8 mmol/L (ref 3.5–5.1)
Sodium: 136 mmol/L (ref 135–145)
Total Bilirubin: 0.3 mg/dL (ref 0.3–1.2)
Total Protein: 7.8 g/dL (ref 6.5–8.1)

## 2023-01-07 LAB — CBC
HCT: 32.8 % — ABNORMAL LOW (ref 36.0–46.0)
Hemoglobin: 11 g/dL — ABNORMAL LOW (ref 12.0–15.0)
MCH: 27.8 pg (ref 26.0–34.0)
MCHC: 33.5 g/dL (ref 30.0–36.0)
MCV: 82.8 fL (ref 80.0–100.0)
Platelets: 265 10*3/uL (ref 150–400)
RBC: 3.96 MIL/uL (ref 3.87–5.11)
RDW: 14.1 % (ref 11.5–15.5)
WBC: 7.1 10*3/uL (ref 4.0–10.5)
nRBC: 0 % (ref 0.0–0.2)

## 2023-01-07 LAB — LIPASE, BLOOD: Lipase: 23 U/L (ref 11–51)

## 2023-01-07 NOTE — ED Triage Notes (Signed)
Pt reports she noticed a "buldge" on her left side of abdomen today and has nausea. Pt states it causes constant pain, but worsened when she bends over and lays on her side.

## 2023-01-08 ENCOUNTER — Ambulatory Visit (INDEPENDENT_AMBULATORY_CARE_PROVIDER_SITE_OTHER): Payer: Commercial Managed Care - PPO | Admitting: Family Medicine

## 2023-01-08 ENCOUNTER — Emergency Department (HOSPITAL_BASED_OUTPATIENT_CLINIC_OR_DEPARTMENT_OTHER): Payer: Commercial Managed Care - PPO

## 2023-01-08 ENCOUNTER — Encounter: Payer: Self-pay | Admitting: Family Medicine

## 2023-01-08 DIAGNOSIS — M542 Cervicalgia: Secondary | ICD-10-CM | POA: Diagnosis not present

## 2023-01-08 DIAGNOSIS — R110 Nausea: Secondary | ICD-10-CM | POA: Insufficient documentation

## 2023-01-08 DIAGNOSIS — L700 Acne vulgaris: Secondary | ICD-10-CM

## 2023-01-08 DIAGNOSIS — M546 Pain in thoracic spine: Secondary | ICD-10-CM | POA: Diagnosis not present

## 2023-01-08 DIAGNOSIS — D509 Iron deficiency anemia, unspecified: Secondary | ICD-10-CM

## 2023-01-08 LAB — POCT URINE PREGNANCY: Preg Test, Ur: NEGATIVE

## 2023-01-08 LAB — POC URINALSYSI DIPSTICK (AUTOMATED)
Bilirubin, UA: NEGATIVE
Blood, UA: NEGATIVE
Glucose, UA: NEGATIVE
Ketones, UA: 80
Leukocytes, UA: NEGATIVE
Nitrite, UA: NEGATIVE
Protein, UA: NEGATIVE
Spec Grav, UA: 1.02 (ref 1.010–1.025)
Urobilinogen, UA: 0.2 E.U./dL
pH, UA: 6.5 (ref 5.0–8.0)

## 2023-01-08 LAB — RAPID HIV SCREEN (HIV 1/2 AB+AG)
HIV 1/2 Antibodies: NONREACTIVE
HIV-1 P24 Antigen - HIV24: NONREACTIVE

## 2023-01-08 MED ORDER — ONDANSETRON HCL 4 MG PO TABS: 4.0000 mg | ORAL_TABLET | Freq: Three times a day (TID) | ORAL | 0 refills | Status: DC | PRN

## 2023-01-08 MED ORDER — IRON (FERROUS SULFATE) 325 (65 FE) MG PO TABS: 325.0000 mg | ORAL_TABLET | Freq: Every day | ORAL | 5 refills | Status: DC

## 2023-01-08 NOTE — ED Notes (Signed)
Requested urine sample, pt reports she just went on her way to xray and forgot about the sample, states she probably will not be able to provide one tonight

## 2023-01-08 NOTE — Assessment & Plan Note (Signed)
Omeprazole

## 2023-01-08 NOTE — Progress Notes (Signed)
Established Patient Office Visit  Subjective   Patient ID: Jodi Norman, female    DOB: 1995-12-01  Age: 27 y.o. MRN: [id number]  No chief complaint on file.   HPI Discussed the use of AI scribe software for clinical note transcription with the patient, who gave verbal consent to proceed.  History of Present Illness   The patient, with a history of GERD, anemia, and sarcoidosis, presents with persistent nausea of unknown etiology. The nausea is severe enough to affect her appetite, leading to a lack of food intake on the day of the visit. She denies any association with specific foods or increased burping. She also reports a pain on the left side, specifically under the rib. The pain was severe enough to warrant a visit to the emergency room, where x-rays and blood work were performed. She reports that the pain is tender to touch and worsens with deep pressure.  In addition to the nausea and left-sided pain, she also reports generalized back pain. She has a history of a herniated disc and has previously seen an orthopedic specialist and undergone physical therapy. She also mentions discomfort related to large breasts, with marks on the shoulders from bra straps. She has a consultation scheduled with a breast reduction surgeon and is gathering relevant medical documentation.  She also mentions a recent weight loss of almost 30 pounds. Despite this, she is concerned about persistent areas of fat. She also reports a sudden breakout on the face, which she attributes to hormonal issues. She denies any recent irresponsible behavior that could lead to pregnancy but expresses concern about the possibility due to the persistent nausea.  Her anemia is ongoing, with a slight improvement noted in recent blood work. She has been taking iron supplements but has not been consistent recently. She expresses frustration with the persistent anemia despite taking the supplements.      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   High risk heterosexual behavior 08/22/2022   Acute vaginitis 08/22/2022   Positive ANA (antinuclear antibody) 02/07/2022   Myalgia 11/24/2021   Arthralgia 11/14/2021   Iron deficiency anemia 08/17/2021   GERD (gastroesophageal reflux disease)    Morbid obesity (HCC) 06/05/2021   History of scoliosis 03/06/2021   Bilateral thoracic back pain 03/06/2021   Bone lesion 10/27/2020   Daytime somnolence 07/11/2020   Other fatigue 07/11/2020   Metabolic syndrome 07/11/2020   History of pre-eclampsia  07/11/2020   Obesity (BMI 30-39.9) 07/11/2020   Insomnia 07/08/2020   Intractable migraine without aura and without status migrainosus 07/08/2020   Chest pain 07/08/2020   Generalized anxiety disorder 06/24/2020   Carpal tunnel syndrome, bilateral 05/07/2018   Cesarean delivery delivered 04/28/2018   Right hand paresthesia 06/19/2017   Past Medical History:  Diagnosis Date   Generalized anxiety disorder 06/24/2020   GERD (gastroesophageal reflux disease)    Past Surgical History:  Procedure Laterality Date   CESAREAN SECTION     MOUTH SURGERY  05/2021   UPPER GI ENDOSCOPY  04/27/2021   Social History   Tobacco Use   Smoking status: Never   Smokeless tobacco: Never  Vaping Use   Vaping status: Never Used  Substance Use Topics   Alcohol use: Never   Drug use: Never   Social History   Socioeconomic History   Marital status: Single    Spouse  name: Not on file   Number of children: Not on file   Years of education: Not on file   Highest education level: Some college, no degree  Occupational History   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[employer]  Tobacco Use   Smoking status: Never   Smokeless tobacco: Never  Vaping Use   Vaping status: Never Used  Substance and Sexual Activity   Alcohol use: Never   Drug use: Never    Sexual activity: Not Currently    Birth control/protection: None  Other Topics Concern   Not on file  Social History Narrative   Right handed   Social Determinants of Health   Financial Resource Strain: Medium Risk (01/07/2023)   Overall Financial Resource Strain (CARDIA)    Difficulty of Paying Living Expenses: Somewhat hard  Food Insecurity: Food Insecurity Present (01/07/2023)   Hunger Vital Sign    Worried About Running Out of Food in the Last Year: Often true    Ran Out of Food in the Last Year: Often true  Transportation Needs: No Transportation Needs (01/07/2023)   PRAPARE - Administrator, Civil Service (Medical): No    Lack of Transportation (Non-Medical): No  Physical Activity: Insufficiently Active (01/07/2023)   Exercise Vital Sign    Days of Exercise per Week: 5 days    Minutes of Exercise per Session: 10 min  Stress: Stress Concern Present (01/07/2023)   [employer] of Occupational Health - Occupational Stress Questionnaire    Feeling of Stress : To some extent  Social Connections: Moderately Isolated (01/07/2023)   Social Connection and Isolation Panel [NHANES]    Frequency of Communication with Friends and Family: More than three times a week    Frequency of Social Gatherings with Friends and Family: More than three times a week    Attends Religious Services: 1 to 4 times per year    Active Member of [employer] or Organizations: No    Attends Engineer, structural: Not on file    Marital Status: Never married  Intimate Partner Violence: Not At Risk (04/17/2022)   Received from [employer], [facility]   HITS    Over the last 12 months how often did your partner physically hurt you?: 1    Over the last 12 months how often did your partner insult you or talk down to you?: 1    Over the last 12 months how often did your partner threaten you with physical harm?: 1    Over the last 12 months how often did your partner scream or curse at you?: 1    Family Status  Relation Name Status   Mother  Alive   Father  Alive   Neg Hx  (Not Specified)  No partnership data on file   Family History  Problem Relation Age of Onset   Hyperlipidemia Mother    Hypertension Mother    Hyperlipidemia Father    Hypertension Father    Diabetes Father    Colon cancer Neg Hx    Stomach cancer Neg Hx    Esophageal cancer Neg Hx    Pancreatic cancer Neg Hx    Colon polyps Neg Hx    Rectal cancer Neg Hx    No Known Allergies    Review of Systems  Constitutional:  Negative for chills, fever and malaise/fatigue.  HENT:  Negative for congestion and hearing loss.   Eyes:  Negative for blurred vision and discharge.  Respiratory:  Negative for cough, sputum production and shortness of breath.   Cardiovascular:  Negative for chest pain, palpitations and leg swelling.  Gastrointestinal:  Negative for abdominal pain, blood in stool, constipation, diarrhea, heartburn, nausea and vomiting.  Genitourinary:  Negative for dysuria, frequency, hematuria and urgency.  Musculoskeletal:  Negative for back pain, falls and myalgias.  Skin:  Negative for rash.  Neurological:  Negative for dizziness, sensory change, loss of consciousness, weakness and headaches.  Endo/Heme/Allergies:  Negative for environmental allergies. Does not bruise/bleed easily.  Psychiatric/Behavioral:  Negative for depression and suicidal ideas. The patient is not nervous/anxious and does not have insomnia.      Objective:     Ht 5\' 6"  (1.676 m)   Wt 240 lb 12.8 oz (109.2 kg)   LMP 12/20/2022 (Exact Date)   BMI 38.87 kg/m  BP Readings from Last 3 Encounters:  01/08/23 115/79  01/08/23 118/63  08/21/22 126/80   Wt Readings from Last 3 Encounters:  01/08/23 240 lb 12.8 oz (109.2 kg)  01/07/23 240 lb (108.9 kg)  08/21/22 261 lb 4 oz (118.5 kg)   SpO2 Readings from Last 3 Encounters:  01/08/23 99%  01/08/23 100%  08/21/22 95%      Physical Exam Vitals and nursing note  reviewed. Exam conducted with a chaperone present.  Constitutional:      General: She is not in acute distress.    Appearance: Normal appearance. She is well-developed.  HENT:     Head: Normocephalic and atraumatic.  Eyes:     General: No scleral icterus.       Right eye: No discharge.        Left eye: No discharge.  Cardiovascular:     Rate and Rhythm: Normal rate and regular rhythm.     Heart sounds: No murmur heard. Pulmonary:     Effort: Pulmonary effort is normal. No respiratory distress.     Breath sounds: Normal breath sounds.  Abdominal:     Tenderness: There is abdominal tenderness in the left upper quadrant. There is no guarding or rebound.  Genitourinary:    General: Normal vulva.     Exam position: Lithotomy position.     Pubic Area: No rash or pubic lice.      Labia:        Right: No rash, tenderness, lesion or injury.        Left: No rash, tenderness, lesion or injury.      Vagina: Normal. No vaginal discharge.     Cervix: Normal.     Uterus: Normal.      Adnexa: Right adnexa normal and left adnexa normal.     Rectum: Normal. Guaiac result negative.  Musculoskeletal:        General: Normal range of motion.     Cervical back: Normal range of motion and neck supple.     Right lower leg: No edema.     Left lower leg: No edema.  Skin:    General: Skin is warm and dry.  Neurological:     General: No focal deficit present.     Mental Status: She is alert and oriented to person, place, and time.  Psychiatric:        Mood and Affect: Mood normal.        Behavior: Behavior normal.        Thought Content: Thought content normal.        Judgment: Judgment normal.    Results for orders placed or performed during the  hospital encounter of 01/07/23  Lipase, blood  Result Value Ref Range   Lipase 23 11 - 51 U/L  Comprehensive metabolic panel  Result Value Ref Range   Sodium 136 135 - 145 mmol/L   Potassium 3.8 3.5 - 5.1 mmol/L   Chloride 104 98 - 111 mmol/L    CO2 23 22 - 32 mmol/L   Glucose, Bld 96 70 - 99 mg/dL   BUN 9 6 - 20 mg/dL   Creatinine, Ser 2.53 0.44 - 1.00 mg/dL   Calcium 9.1 8.9 - 66.4 mg/dL   Total Protein 7.8 6.5 - 8.1 g/dL   Albumin 3.9 3.5 - 5.0 g/dL   AST 26 15 - 41 U/L   ALT 14 0 - 44 U/L   Alkaline Phosphatase 81 38 - 126 U/L   Total Bilirubin 0.3 0.3 - 1.2 mg/dL   GFR, Estimated >40 >34 mL/min   Anion gap 9 5 - 15  CBC  Result Value Ref Range   WBC 7.1 4.0 - 10.5 K/uL   RBC 3.96 3.87 - 5.11 MIL/uL   Hemoglobin 11.0 (L) 12.0 - 15.0 g/dL   HCT 74.2 (L) 59.5 - 63.8 %   MCV 82.8 80.0 - 100.0 fL   MCH 27.8 26.0 - 34.0 pg   MCHC 33.5 30.0 - 36.0 g/dL   RDW 75.6 43.3 - 29.5 %   Platelets 265 150 - 400 K/uL   nRBC 0.0 0.0 - 0.2 %  Rapid HIV screen (HIV 1/2 Ab+Ag)  Result Value Ref Range   HIV-1 P24 Antigen - HIV24 NON REACTIVE NON REACTIVE   HIV 1/2 Antibodies NON REACTIVE NON REACTIVE   Interpretation (HIV Ag Ab)      A non reactive test result means that HIV 1 or HIV 2 antibodies and HIV 1 p24 antigen were not detected in the specimen.    Last CBC Lab Results  Component Value Date   WBC 7.1 01/07/2023   HGB 11.0 (L) 01/07/2023   HCT 32.8 (L) 01/07/2023   MCV 82.8 01/07/2023   MCH 27.8 01/07/2023   RDW 14.1 01/07/2023   PLT 265 01/07/2023   Last metabolic panel Lab Results  Component Value Date   GLUCOSE 96 01/07/2023   NA 136 01/07/2023   K 3.8 01/07/2023   CL 104 01/07/2023   CO2 23 01/07/2023   BUN 9 01/07/2023   CREATININE 0.78 01/07/2023   GFRNONAA >60 01/07/2023   CALCIUM 9.1 01/07/2023   PROT 7.8 01/07/2023   ALBUMIN 3.9 01/07/2023   BILITOT 0.3 01/07/2023   ALKPHOS 81 01/07/2023   AST 26 01/07/2023   ALT 14 01/07/2023   ANIONGAP 9 01/07/2023   Last lipids Lab Results  Component Value Date   CHOL 143 08/21/2022   HDL 35.20 (L) 08/21/2022   LDLCALC 84 08/21/2022   TRIG 117.0 08/21/2022   CHOLHDL 4 08/21/2022   Last hemoglobin A1c Lab Results  Component Value Date   HGBA1C 6.1  07/20/2022   Last thyroid functions Lab Results  Component Value Date   TSH 1.27 08/21/2022   Last vitamin D Lab Results  Component Value Date   VD25OH 9.66 (L) 07/20/2022   Last vitamin B12 and Folate Lab Results  Component Value Date   VITAMINB12 285 11/16/2021      The ASCVD Risk score (Arnett DK, et al., 2019) failed to calculate for the following reasons:   The 2019 ASCVD risk score is only valid for ages 56 to 8  Assessment & Plan:   Problem List Items Addressed This Visit   None   No follow-ups on file. Assessment and Plan    Nausea Persistent, non-specific nausea. No clear triggers or exacerbating factors. Possible relation to GERD. No current use of Omeprazole. -Start Omeprazole. -Order pregnancy test to rule out pregnancy as a cause of nausea. -Order blood work to investigate other possible causes.  Left-sided pain Pain localized to left lower rib area. Tenderness on deep palpation. Recent ER visit with negative x-ray and blood work. -Consider further imaging if pain persists.  Iron-deficiency anemia Chronic anemia with slight improvement on iron supplementation. Unclear compliance with iron supplementation. -Resume iron supplementation daily. -Consider further workup if anemia persists despite supplementation.  Breast reduction consultation Upcoming consultation for breast reduction due to chronic back pain. Request for documentation of back pain and related issues. -Release medical records related to back pain, orthopedic consultations, and physical therapy to the plastic surgeon.  General Health Maintenance -Continue monitoring for signs of blood in stool or urine due to chronic anemia. -Plan for first mammogram at age 65.         Donato Schultz, DO

## 2023-01-08 NOTE — ED Provider Notes (Signed)
[facility]EMERGENCY DEPT [facility] Emergency Department Provider Note MRN:  [id number]  Arrival date & time: 01/08/23     Chief Complaint   Rib pain History of Present Illness   Jodi Norman is a 27 y.o. year-old female with a history of GERD presenting to the ED with chief complaint of rib pain.  Pain to the left lower lateral ribs starting today.  Worse with palpation.  Worse when bending over or laying on the ribs.  Has had nausea for months that she attributes to her GERD.  Does not always take her omeprazole.  No shortness of breath.  No abdominal pain or tenderness.  Review of Systems  A thorough review of systems was obtained and all systems are negative except as noted in the HPI and PMH.   Patient's Health History    Past Medical History:  Diagnosis Date   Generalized anxiety disorder 06/24/2020   GERD (gastroesophageal reflux disease)     Past Surgical History:  Procedure Laterality Date   CESAREAN SECTION     MOUTH SURGERY  05/2021   UPPER GI ENDOSCOPY  04/27/2021    Family History  Problem Relation Age of Onset   Hyperlipidemia Mother    Hypertension Mother    Hyperlipidemia Father    Hypertension Father    Diabetes Father    Colon cancer Neg Hx    Stomach cancer Neg Hx    Esophageal cancer Neg Hx    Pancreatic cancer Neg Hx    Colon polyps Neg Hx    Rectal cancer Neg Hx     Social History   Socioeconomic History   Marital status: Single    Spouse name: Not on file   Number of children: Not on file   Years of education: Not on file   Highest education level: Some college, no degree  Occupational History   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[employer]  Tobacco Use   Smoking status: Never   Smokeless tobacco: Never  Vaping Use   Vaping status: Never Used  Substance and Sexual Activity   Alcohol use: Never   Drug use: Never   Sexual activity: Not Currently    Birth control/protection: None  Other Topics Concern   Not on  file  Social History Narrative   Right handed   Social Determinants of Health   Financial Resource Strain: Medium Risk (01/07/2023)   Overall Financial Resource Strain (CARDIA)    Difficulty of Paying Living Expenses: Somewhat hard  Food Insecurity: Food Insecurity Present (01/07/2023)   Hunger Vital Sign    Worried About Running Out of Food in the Last Year: Often true    Ran Out of Food in the Last Year: Often true  Transportation Needs: No Transportation Needs (01/07/2023)   PRAPARE - Administrator, Civil Service (Medical): No    Lack of Transportation (Non-Medical): No  Physical Activity: Insufficiently Active (01/07/2023)   Exercise Vital Sign    Days of Exercise per Week: 5 days    Minutes of Exercise per Session: 10 min  Stress: Stress Concern Present (01/07/2023)   [employer] of Occupational Health - Occupational Stress Questionnaire    Feeling of Stress : To some extent  Social Connections: Moderately Isolated (01/07/2023)   Social Connection and Isolation Panel [NHANES]    Frequency of Communication with Friends and Family: More than three times a week    Frequency of Social Gatherings with Friends and Family: More than  three times a week    Attends Religious Services: 1 to 4 times per year    Active Member of Clubs or Organizations: No    Attends Banker Meetings: Not on file    Marital Status: Never married  Intimate Partner Violence: Not At Risk (04/17/2022)   [facility]   HITS    Over the last 12 months how often did your partner physically hurt you?: 1    Over the last 12 months how often did your partner insult you or talk down to you?: 1    Over the last 12 months how often did your partner threaten you with physical harm?: 1    Over the last 12 months how often did your partner scream or curse at you?: 1     Physical Exam   Vitals:   01/07/23 2040  BP: (!) 147/65  Pulse: 84  Resp: 18  Temp: 98  F (36.7 C)  SpO2: 99%    CONSTITUTIONAL: Well-appearing, NAD NEURO/PSYCH:  Alert and oriented x 3, no focal deficits EYES:  eyes equal and reactive ENT/NECK:  no LAD, no JVD CARDIO: Regular rate, well-perfused, normal S1 and S2 PULM:  CTAB no wheezing or rhonchi GI/GU:  non-distended, non-tender MSK/SPINE:  No gross deformities, no edema SKIN:  no rash, atraumatic   *Additional and/or pertinent findings included in MDM below  Diagnostic and Interventional Summary    EKG Interpretation Date/Time:    Ventricular Rate:    PR Interval:    QRS Duration:    QT Interval:    QTC Calculation:   R Axis:      Text Interpretation:         Labs Reviewed  CBC - Abnormal; Notable for the following components:      Result Value   Hemoglobin 11.0 (*)    HCT 32.8 (*)    All other components within normal limits  LIPASE, BLOOD  COMPREHENSIVE METABOLIC PANEL  URINALYSIS, ROUTINE W REFLEX MICROSCOPIC  PREGNANCY, URINE  RAPID HIV SCREEN (HIV 1/2 AB+AG)    DG Chest 2 View  Final Result      Medications - No data to display   Procedures  /  Critical Care Procedures  ED Course and Medical Decision Making  Initial Impression and Ddx Favoring costochondritis.  Also considered pneumothorax.  Patient is without evidence of DVT, no tachycardia or hypoxia, highly doubt PE especially given that the pain is reproducible with palpation of the ribs.  There is no palpable hernia or any abdominal tenderness or involvement seemingly.  Past medical/surgical history that increases complexity of ED encounter: History of sarcoidosis  Interpretation of Diagnostics I personally reviewed the Chest Xray and my interpretation is as follows: No pneumothorax    Patient Reassessment and Ultimate Disposition/Management     Discharge  Patient management required discussion with the following services or consulting groups:  None  Complexity of Problems Addressed Acute illness or injury that  poses threat of life of bodily function  Additional Data Reviewed and Analyzed Further history obtained from: None  Additional Factors Impacting ED Encounter Risk None  Elmer Sow. Pilar Plate, MD [facility] Health Emergency Medicine [facility] [email or web address]  Final Clinical Impressions(s) / ED Diagnoses     ICD-10-CM   1. Rib pain  R07.81       ED Discharge Orders     None        Discharge Instructions Discussed with and Provided to  Patient:     Discharge Instructions      You were evaluated in the Emergency Department and after careful evaluation, we did not find any emergent condition requiring admission or further testing in the hospital.  Your exam/testing today is overall reassuring.  X-ray was normal.  Pain likely due to inflammation or strain of the muscle between the ribs.  Recommend Tylenol or Motrin at home for discomfort.  Please return to the Emergency Department if you experience any worsening of your condition.   Thank you for allowing [place] to be a part of your care.       Sabas Sous, MD 01/08/23 [phone]

## 2023-01-08 NOTE — Discharge Instructions (Addendum)
You were evaluated in the Emergency Department and after careful evaluation, we did not find any emergent condition requiring admission or further testing in the hospital.  Your exam/testing today is overall reassuring.  X-ray was normal.  Pain likely due to inflammation or strain of the muscle between the ribs.  Recommend Tylenol or Motrin at home for discomfort.  Use MyChart to follow-up on your HIV testing.  Please return to the Emergency Department if you experience any worsening of your condition.   Thank you for allowing [place] to be a part of your care.

## 2023-01-11 ENCOUNTER — Observation Stay (HOSPITAL_COMMUNITY)
Admission: EM | Admit: 2023-01-11 | Payer: Commercial Managed Care - PPO | Source: Home / Self Care | Attending: Emergency Medicine | Admitting: Emergency Medicine

## 2023-01-11 ENCOUNTER — Other Ambulatory Visit: Payer: Self-pay

## 2023-01-11 ENCOUNTER — Emergency Department (HOSPITAL_COMMUNITY): Payer: Commercial Managed Care - PPO

## 2023-01-11 ENCOUNTER — Encounter (HOSPITAL_COMMUNITY): Payer: Self-pay

## 2023-01-11 ENCOUNTER — Observation Stay (HOSPITAL_COMMUNITY): Payer: Commercial Managed Care - PPO

## 2023-01-11 DIAGNOSIS — D500 Iron deficiency anemia secondary to blood loss (chronic): Secondary | ICD-10-CM | POA: Diagnosis not present

## 2023-01-11 DIAGNOSIS — E871 Hypo-osmolality and hyponatremia: Secondary | ICD-10-CM | POA: Insufficient documentation

## 2023-01-11 DIAGNOSIS — E876 Hypokalemia: Secondary | ICD-10-CM | POA: Insufficient documentation

## 2023-01-11 DIAGNOSIS — E872 Acidosis, unspecified: Secondary | ICD-10-CM | POA: Insufficient documentation

## 2023-01-11 DIAGNOSIS — R000 Tachycardia, unspecified: Secondary | ICD-10-CM | POA: Diagnosis not present

## 2023-01-11 DIAGNOSIS — R0600 Dyspnea, unspecified: Secondary | ICD-10-CM

## 2023-01-11 DIAGNOSIS — E860 Dehydration: Secondary | ICD-10-CM | POA: Diagnosis not present

## 2023-01-11 DIAGNOSIS — R0602 Shortness of breath: Secondary | ICD-10-CM | POA: Diagnosis present

## 2023-01-11 LAB — CBG MONITORING, ED: Glucose-Capillary: 82 mg/dL (ref 70–99)

## 2023-01-11 LAB — CBC WITH DIFFERENTIAL/PLATELET
Abs Immature Granulocytes: 0.03 10*3/uL (ref 0.00–0.07)
Basophils Absolute: 0 10*3/uL (ref 0.0–0.1)
Basophils Relative: 0 %
Eosinophils Absolute: 0.1 10*3/uL (ref 0.0–0.5)
Eosinophils Relative: 1 %
HCT: 37.1 % (ref 36.0–46.0)
Hemoglobin: 12.7 g/dL (ref 12.0–15.0)
Immature Granulocytes: 0 %
Lymphocytes Relative: 39 %
Lymphs Abs: 3.3 10*3/uL (ref 0.7–4.0)
MCH: 28.3 pg (ref 26.0–34.0)
MCHC: 34.2 g/dL (ref 30.0–36.0)
MCV: 82.8 fL (ref 80.0–100.0)
Monocytes Absolute: 0.8 10*3/uL (ref 0.1–1.0)
Monocytes Relative: 10 %
Neutro Abs: 4.3 10*3/uL (ref 1.7–7.7)
Neutrophils Relative %: 50 %
Platelets: 292 10*3/uL (ref 150–400)
RBC: 4.48 MIL/uL (ref 3.87–5.11)
RDW: 13.7 % (ref 11.5–15.5)
WBC: 8.6 10*3/uL (ref 4.0–10.5)
nRBC: 0 % (ref 0.0–0.2)

## 2023-01-11 LAB — TROPONIN I (HIGH SENSITIVITY)
Troponin I (High Sensitivity): 4 ng/L (ref <18)
Troponin I (High Sensitivity): 4 ng/L (ref <18)

## 2023-01-11 LAB — URINALYSIS, ROUTINE W REFLEX MICROSCOPIC
Bilirubin Urine: NEGATIVE
Glucose, UA: NEGATIVE mg/dL
Hgb urine dipstick: NEGATIVE
Ketones, ur: 80 mg/dL — AB
Leukocytes,Ua: NEGATIVE
Nitrite: NEGATIVE
Protein, ur: NEGATIVE mg/dL
Specific Gravity, Urine: >1.046 — ABNORMAL HIGH (ref 1.005–1.030)
pH: 5 (ref 5.0–8.0)

## 2023-01-11 LAB — BASIC METABOLIC PANEL
Anion gap: 13 (ref 5–15)
BUN: 11 mg/dL (ref 6–20)
CO2: 19 mmol/L — ABNORMAL LOW (ref 22–32)
Calcium: 9.2 mg/dL (ref 8.9–10.3)
Chloride: 99 mmol/L (ref 98–111)
Creatinine, Ser: 0.73 mg/dL (ref 0.44–1.00)
GFR, Estimated: >60 mL/min (ref >60)
Glucose, Bld: 101 mg/dL — ABNORMAL HIGH (ref 70–99)
Potassium: 3.4 mmol/L — ABNORMAL LOW (ref 3.5–5.1)
Sodium: 131 mmol/L — ABNORMAL LOW (ref 135–145)

## 2023-01-11 LAB — RAPID URINE DRUG SCREEN, HOSP PERFORMED
Amphetamines: NOT DETECTED
Barbiturates: NOT DETECTED
Benzodiazepines: NOT DETECTED
Cocaine: NOT DETECTED
Opiates: NOT DETECTED
Tetrahydrocannabinol: POSITIVE — AB

## 2023-01-11 LAB — CK: Total CK: 383 U/L — ABNORMAL HIGH (ref 38–234)

## 2023-01-11 LAB — MAGNESIUM: Magnesium: 2.1 mg/dL (ref 1.7–2.4)

## 2023-01-11 LAB — D-DIMER, QUANTITATIVE: D-Dimer, Quant: 0.6 ug{FEU}/mL — ABNORMAL HIGH (ref 0.00–0.50)

## 2023-01-11 LAB — I-STAT CG4 LACTIC ACID, ED: Lactic Acid, Venous: 1.3 mmol/L (ref 0.5–1.9)

## 2023-01-11 LAB — TSH: TSH: 2.754 u[IU]/mL (ref 0.350–4.500)

## 2023-01-11 LAB — POC URINE PREG, ED: Preg Test, Ur: NEGATIVE

## 2023-01-11 MED ORDER — PANTOPRAZOLE SODIUM 40 MG PO TBEC
40.0000 mg | DELAYED_RELEASE_TABLET | Freq: Every day | ORAL | Status: DC
  Administered 2023-01-11 – 2023-01-12 (×2): 40 mg via ORAL
  Filled 2023-01-11 (×2): qty 1

## 2023-01-11 MED ORDER — SODIUM CHLORIDE (PF) 0.9 % IJ SOLN
INTRAMUSCULAR | Status: AC
  Filled 2023-01-11: qty 50

## 2023-01-11 MED ORDER — SODIUM CHLORIDE 0.9 % IV BOLUS
1000.0000 mL | Freq: Once | INTRAVENOUS | Status: AC
  Administered 2023-01-11: 1000 mL via INTRAVENOUS

## 2023-01-11 MED ORDER — ACETAMINOPHEN 325 MG PO TABS
650.0000 mg | ORAL_TABLET | Freq: Four times a day (QID) | ORAL | Status: DC | PRN
  Administered 2023-01-11 – 2023-01-12 (×2): 650 mg via ORAL
  Filled 2023-01-11 (×2): qty 2

## 2023-01-11 MED ORDER — LORAZEPAM 2 MG/ML IJ SOLN
0.5000 mg | Freq: Once | INTRAMUSCULAR | Status: AC
  Administered 2023-01-11: 0.5 mg via INTRAVENOUS
  Filled 2023-01-11: qty 1

## 2023-01-11 MED ORDER — FERROUS SULFATE 325 (65 FE) MG PO TABS
325.0000 mg | ORAL_TABLET | Freq: Every day | ORAL | Status: DC
  Administered 2023-01-11: 325 mg via ORAL
  Filled 2023-01-11 (×3): qty 1

## 2023-01-11 MED ORDER — ONDANSETRON HCL 4 MG PO TABS
4.0000 mg | ORAL_TABLET | Freq: Four times a day (QID) | ORAL | Status: DC | PRN
  Administered 2023-01-11: 4 mg via ORAL
  Filled 2023-01-11: qty 1

## 2023-01-11 MED ORDER — ONDANSETRON HCL 4 MG/2ML IJ SOLN: 4.0000 mg | Freq: Four times a day (QID) | INTRAMUSCULAR | Status: DC | PRN

## 2023-01-11 MED ORDER — ALBUTEROL SULFATE (2.5 MG/3ML) 0.083% IN NEBU: 2.5000 mg | INHALATION_SOLUTION | RESPIRATORY_TRACT | Status: DC | PRN

## 2023-01-11 MED ORDER — OXYCODONE HCL 5 MG PO TABS
5.0000 mg | ORAL_TABLET | Freq: Once | ORAL | Status: AC
  Administered 2023-01-11: 5 mg via ORAL
  Filled 2023-01-11: qty 1

## 2023-01-11 MED ORDER — ACETAMINOPHEN 650 MG RE SUPP: 650.0000 mg | Freq: Four times a day (QID) | RECTAL | Status: DC | PRN

## 2023-01-11 MED ORDER — SODIUM CHLORIDE 0.9 % IV SOLN: INTRAVENOUS | Status: DC

## 2023-01-11 MED ORDER — ENOXAPARIN SODIUM 60 MG/0.6ML IJ SOSY
50.0000 mg | PREFILLED_SYRINGE | INTRAMUSCULAR | Status: DC
  Administered 2023-01-11 – 2023-01-12 (×2): 50 mg via SUBCUTANEOUS
  Filled 2023-01-11 (×2): qty 0.6

## 2023-01-11 MED ORDER — MELATONIN 3 MG PO TABS
3.0000 mg | ORAL_TABLET | Freq: Once | ORAL | Status: AC
  Administered 2023-01-11: 3 mg via ORAL
  Filled 2023-01-11: qty 1

## 2023-01-11 MED ORDER — IOHEXOL 350 MG/ML SOLN
100.0000 mL | Freq: Once | INTRAVENOUS | Status: AC | PRN
  Administered 2023-01-11: 100 mL via INTRAVENOUS

## 2023-01-11 NOTE — ED Triage Notes (Signed)
Says she has been having shortness of breath all day. First noticed earlier as she was walking her dog and become so short of breath she couldn't go up the steps to her apartment.   Just finished a 55 hour fast and has not been drinking much fluids at all.   Hx of Sarcoidosis and says she is high risk of DVT's but tries to stay active.

## 2023-01-11 NOTE — H&P (Signed)
History and Physical  Jodi Norman NWG:[id number] DOB: 26-Apr-1996 DOA: 01/11/2023  PCP: Donato Schultz, DO   Chief Complaint: Shortness of breath  HPI: Jodi Norman is a 27 y.o. female with medical history significant for sarcoidosis presenting to the emergency department for shortness of breath.  She has a remote history of anxiety (says this was when she was at [employer] and told that she might have cancer a couple of years ago), GERD.  For the last several days, she has been having some musculoskeletal left-sided chest pain which is still tender to palpation, as well as some vague nausea without vomiting.  Feels like this could be related to her GERD.  Due to this, she has not eaten very much in the last 3 days or so.  Yesterday she noticed that she was getting a little breathless when she tried to ambulate.  This seemed to worsen through the night, she denies any pleuritic chest pain, fevers, chills, vomiting, palpitations.  She woke up in the middle of the night feeling like she could not catch her breath.  This sensation also worsens with ambulation, and sometimes with lying flat.  She has never had issue like this before.  A couple of days ago, she did notice that her bilateral lower extremities were swollen which happens from time to time with her sarcoidosis, but this morning when she woke up the swelling is completely gone.  She denies any persistent orthopnea, or cough.  She also denies any anxiety or stress lately.  No new medications.  Her only prescription medication is omeprazole, which she does not remember to take every day.  ED Course: On presentation to the emergency department, documented heart rate of 109, rest of her vitals are unremarkable.  During my examination, the patient's heart rate goes down to about 95 when completely at rest, however even with moving around in the bed adjusting the sheets, heart rate goes up to the 130s.  Per ER provider, when ambulating she became a  little breathless, heart rate went into the 140s.  EKG personally reviewed with no acute findings, she remains on telemetry.  Lab work was done CBC and BMP essentially unremarkable.  ER provider has ordered TSH which is pending.  D-dimer was elevated, CTA was done which is negative for any acute process.  Troponin has been negative x 2.  Due to persistent tachycardia, hospitalist was consulted.  Review of Systems: Please see HPI for pertinent positives and negatives. A complete 10 system review of systems are otherwise negative.  Past Medical History:  Diagnosis Date   Generalized anxiety disorder 06/24/2020   GERD (gastroesophageal reflux disease)    Past Surgical History:  Procedure Laterality Date   CESAREAN SECTION     MOUTH SURGERY  05/2021   UPPER GI ENDOSCOPY  04/27/2021    Social History:  reports that she has never smoked. She has never used smokeless tobacco. She reports that she does not drink alcohol and does not use drugs.   No Known Allergies  Family History  Problem Relation Age of Onset   Hyperlipidemia Mother    Hypertension Mother    Hyperlipidemia Father    Hypertension Father    Diabetes Father    Colon cancer Neg Hx    Stomach cancer Neg Hx    Esophageal cancer Neg Hx    Pancreatic cancer Neg Hx    Colon polyps Neg Hx    Rectal cancer Neg Hx  Prior to Admission medications   Medication Sig Start Date End Date Taking? Authorizing Provider  Iron, Ferrous Sulfate, 325 (65 Fe) MG TABS Take 325 mg by mouth daily. 01/08/23   Donato Schultz, DO  Multiple Vitamin (MULTIVITAMIN) capsule Take 1 capsule by mouth daily.    [provider]  omeprazole (PRILOSEC) 40 MG capsule Take 1 capsule (40 mg total) by mouth 2 (two) times daily. 11/27/21   Donato Schultz, DO  ondansetron (ZOFRAN) 4 MG tablet Take 1 tablet (4 mg total) by mouth every 8 (eight) hours as needed for nausea or vomiting. 01/08/23   Zola Button, Grayling Congress, DO  Vitamin D,  Ergocalciferol, (DRISDOL) 1.25 MG (50000 UNIT) CAPS capsule Take 1 capsule (50,000 Units total) by mouth every 7 (seven) days. 07/20/22   Sharlene Dory, DO    Physical Exam: BP 126/78   Pulse (!) 119   Temp 98.7 F (37.1 C) (Oral)   Resp 17   Ht 5\' 6"  (1.676 m)   Wt 108.9 kg   LMP 12/20/2022 (Exact Date) Comment: negative urine pregnancy test 01/11/23  SpO2 97%   BMI 38.74 kg/m   General:  Alert, oriented, calm, in no acute distress, pleasant and cooperative Eyes: EOMI, clear conjuctivae, white sclerea Neck: supple, no masses, trachea mildline  Cardiovascular: Tachycardic and regular, no murmurs or rubs, no peripheral edema  Respiratory: clear to auscultation bilaterally, no wheezes, no crackles  Abdomen: soft, nontender, nondistended, normal bowel tones heard  Skin: dry, no rashes  Musculoskeletal: no joint effusions, normal range of motion  Psychiatric: appropriate affect, normal speech  Neurologic: extraocular muscles intact, clear speech, moving all extremities with intact sensorium          Labs on Admission:  Basic Metabolic Panel: Recent Labs  Lab 01/07/23 2044 01/08/23 1811 01/11/23 0212  NA 136 135 131*  K 3.8 3.9 3.4*  CL 104 100 99  CO2 23 26 19*  GLUCOSE 96 84 101*  BUN 9 7 11   CREATININE 0.78 0.69 0.73  CALCIUM 9.1 9.7 9.2   Liver Function Tests: Recent Labs  Lab 01/07/23 2044 01/08/23 1811  AST 26 31  ALT 14 13  ALKPHOS 81 87  BILITOT 0.3 0.4  PROT 7.8 7.8  ALBUMIN 3.9 4.5   Recent Labs  Lab 01/07/23 2044  LIPASE 23   No results for input(s): "AMMONIA" in the last 168 hours. CBC: Recent Labs  Lab 01/07/23 2044 01/08/23 1811 01/11/23 0212  WBC 7.1 6.2 8.6  NEUTROABS  --  2.9 4.3  HGB 11.0* 11.7* 12.7  HCT 32.8* 35.4* 37.1  MCV 82.8 85.2 82.8  PLT 265 280.0 292   Cardiac Enzymes: Recent Labs  Lab 01/11/23 0440  CKTOTAL 383*    BNP (last 3 results) No results for input(s): "BNP" in the last 8760 hours.  ProBNP  (last 3 results) No results for input(s): "PROBNP" in the last 8760 hours.  CBG: Recent Labs  Lab 01/11/23 0145  GLUCAP 82    Radiological Exams on Admission: CT Angio Chest PE W and/or Wo Contrast  Result Date: 01/11/2023 CLINICAL DATA:  Pulmonary embolism suspected. High probability. Shortness of breath all day. EXAM: CT ANGIOGRAPHY CHEST WITH CONTRAST TECHNIQUE: Multidetector CT imaging of the chest was performed using the standard protocol during bolus administration of intravenous contrast. Multiplanar CT image reconstructions and MIPs were obtained to evaluate the vascular anatomy. RADIATION DOSE REDUCTION: This exam was performed according to the departmental dose-optimization program which  includes automated exposure control, adjustment of the mA and/or kV according to patient size and/or use of iterative reconstruction technique. CONTRAST:  OMNIPAQUE IOHEXOL 350 MG/ML SOLN COMPARISON:  PET CT 11/27/2021 FINDINGS: Cardiovascular: Satisfactory opacification of the pulmonary arteries to the segmental level. No evidence of pulmonary embolism. Normal heart size. No pericardial effusion. Mediastinum/Nodes: No mass or adenopathy Lungs/Pleura: There is no edema, consolidation, effusion, or pneumothorax. Upper Abdomen: Negative Musculoskeletal: Multifocal sclerotic lesion most pronounced at the T4, T9, T11, T12, and sternal levels, long-standing based on prior imaging with prior PET and MRI obtained. Compared to 2022 MRI no convincing progression. Review of the MIP images confirms the above findings. IMPRESSION: 1. Negative for pulmonary embolism or other acute finding. 2. Long-standing sclerotic bone lesions. No adverse interval evolution. Electronically Signed   By: Tiburcio Pea M.D.   On: 01/11/2023 06:29   DG Chest 2 View  Result Date: 01/11/2023 CLINICAL DATA:  Increasing shortness of breath for 3 days EXAM: CHEST - 2 VIEW COMPARISON:  01/08/2023 FINDINGS: Normal heart size and  mediastinal contours. No acute infiltrate or edema. No effusion or pneumothorax. No acute osseous findings. IMPRESSION: Negative chest. Electronically Signed   By: Tiburcio Pea M.D.   On: 01/11/2023 05:24    Assessment/Plan This is a pleasant 27 year old female with a history of GERD and sarcoidosis who has had several days of persistent nausea and presents with breathlessness found to have persistent sinus tachycardia.  Sinus tachycardia-unclear etiology, patient does not appear anxious, no recent new medications, urine drug screen negative for culprit substance, no heart failure symptoms, no evidence of acute infection.  She does have a history of sarcoidosis so query inflammatory syndrome, or even cardiac sarcoid though this seems unlikely.  Tachycardia is persistent even with minimal movement, after 1 L fluid bolus, second liter has been ordered but not given.  Doubt significant dehydration given normal BUN/creatinine, normal lactate.  Could be due to GERD, though is not complaining of significant nausea currently. -Observation admission -Telemetry -Repeat EKG now due to possible subtle ST depression seen on telemetry -Check TSH -Add on magnesium level -Will update echo as 12/22 study showed low normal EF -Discussed with cardiology who recommends hydration and will see later this morning if tachycardia persists -Give second liter NS bolus now, and continue maintenance fluids  GERD-omeprazole daily  Iron deficiency anemia-ferrous sulfate tablet daily  DVT prophylaxis: Lovenox     Code Status: Full Code  Consults called: Cardiology  Admission status: Observation  Time spent: 46 minutes  Jodi Norman Sharlette Dense MD [facility] Pager [phone]  If 7PM-7AM, please contact night-coverage [email or web address] Password [facility]  01/11/2023, 7:56 AM

## 2023-01-11 NOTE — ED Provider Notes (Signed)
[place] [facility]ERGENCY DEPARTMENT AT [facility] Provider Note   CSN: [id number] Arrival date & time: 01/11/23  0113     History  Chief Complaint  Patient presents with   Shortness of Breath    Jodi Norman is a 27 y.o. female.  HPI   Patient with medical history including anxiety, GERD, sarcoidosis presenting with complaints of shortness of breath.  Patient states this started last night when she woke up, states that she has been fasting for the last 55 hours as she had swelling in her legs and stent to help with her swelling.  Patient states that she ate some eggs and had to monitor but her symptoms did not improve, she states she feels short of breath with exertion, she denies any heart palpitations or chest pain, she denies any cough congestion fevers or chills, she has no cardiac history, no history PEs or DVTs currently not oral birth control no recent surgeries no long immobilization not there is any leg pain or calf tenderness.  Patient denies any stomach pains but endorse that she has had some vomiting, denies any bloody emesis or coffee-ground emesis still passing gas having no bowel movements.  Home Medications Prior to Admission medications   Medication Sig Start Date End Date Taking? Authorizing Provider  Iron, Ferrous Sulfate, 325 (65 Fe) MG TABS Take 325 mg by mouth daily. 01/08/23   Donato Schultz, DO  Multiple Vitamin (MULTIVITAMIN) capsule Take 1 capsule by mouth daily.    [provider]  omeprazole (PRILOSEC) 40 MG capsule Take 1 capsule (40 mg total) by mouth 2 (two) times daily. 11/27/21   Donato Schultz, DO  ondansetron (ZOFRAN) 4 MG tablet Take 1 tablet (4 mg total) by mouth every 8 (eight) hours as needed for nausea or vomiting. 01/08/23   Zola Button, Grayling Congress, DO  Vitamin D, Ergocalciferol, (DRISDOL) 1.25 MG (50000 UNIT) CAPS capsule Take 1 capsule (50,000 Units total) by mouth every 7 (seven) days. 07/20/22   Sharlene Dory, DO      Allergies    Patient has no known allergies.    Review of Systems   Review of Systems  Constitutional:  Negative for chills and fever.  Respiratory:  Positive for chest tightness and shortness of breath.   Cardiovascular:  Negative for chest pain.  Gastrointestinal:  Positive for vomiting. Negative for abdominal pain.  Neurological:  Negative for headaches.    Physical Exam Updated Vital Signs BP 126/78   Pulse (!) 119   Temp 98.7 F (37.1 C) (Oral)   Resp 17   Ht 5\' 6"  (1.676 m)   Wt 108.9 kg   LMP 12/20/2022 (Exact Date) Comment: negative urine pregnancy test 01/11/23  SpO2 97%   BMI 38.74 kg/m  Physical Exam Vitals and nursing note reviewed.  Constitutional:      General: She is not in acute distress.    Appearance: She is not ill-appearing.  HENT:     Head: Normocephalic and atraumatic.     Nose: No congestion.  Eyes:     Conjunctiva/sclera: Conjunctivae normal.  Cardiovascular:     Rate and Rhythm: Regular rhythm. Tachycardia present.     Pulses: Normal pulses.     Heart sounds: No murmur heard.    No friction rub. No gallop.  Pulmonary:     Effort: No respiratory distress.     Breath sounds: No wheezing, rhonchi or rales.  Abdominal:     Palpations:  Abdomen is soft.     Tenderness: There is no abdominal tenderness. There is no right CVA tenderness or left CVA tenderness.  Musculoskeletal:     Comments: No unilateral leg swelling no calf tenderness no palpable cords.  Skin:    General: Skin is warm and dry.  Neurological:     Mental Status: She is alert.  Psychiatric:        Mood and Affect: Mood normal.     ED Results / Procedures / Treatments   Labs (all labs ordered are listed, but only abnormal results are displayed) Labs Reviewed  BASIC METABOLIC PANEL - Abnormal; Notable for the following components:      Result Value   Sodium 131 (*)    Potassium 3.4 (*)    CO2 19 (*)    Glucose, Bld 101 (*)    All other components within  normal limits  D-DIMER, QUANTITATIVE - Abnormal; Notable for the following components:   D-Dimer, Quant 0.60 (*)    All other components within normal limits  URINALYSIS, ROUTINE W REFLEX MICROSCOPIC - Abnormal; Notable for the following components:   Specific Gravity, Urine >1.046 (*)    Ketones, ur 80 (*)    All other components within normal limits  RAPID URINE DRUG SCREEN, HOSP PERFORMED - Abnormal; Notable for the following components:   Tetrahydrocannabinol POSITIVE (*)    All other components within normal limits  CK - Abnormal; Notable for the following components:   Total CK 383 (*)    All other components within normal limits  CBC WITH DIFFERENTIAL/PLATELET  TSH  CBG MONITORING, ED  POC URINE PREG, ED  I-STAT CG4 LACTIC ACID, ED  I-STAT CG4 LACTIC ACID, ED  TROPONIN I (HIGH SENSITIVITY)  TROPONIN I (HIGH SENSITIVITY)    EKG None  Radiology CT Angio Chest PE W and/or Wo Contrast  Result Date: 01/11/2023 CLINICAL DATA:  Pulmonary embolism suspected. High probability. Shortness of breath all day. EXAM: CT ANGIOGRAPHY CHEST WITH CONTRAST TECHNIQUE: Multidetector CT imaging of the chest was performed using the standard protocol during bolus administration of intravenous contrast. Multiplanar CT image reconstructions and MIPs were obtained to evaluate the vascular anatomy. RADIATION DOSE REDUCTION: This exam was performed according to the departmental dose-optimization program which includes automated exposure control, adjustment of the mA and/or kV according to patient size and/or use of iterative reconstruction technique. CONTRAST:  OMNIPAQUE IOHEXOL 350 MG/ML SOLN COMPARISON:  PET CT 11/27/2021 FINDINGS: Cardiovascular: Satisfactory opacification of the pulmonary arteries to the segmental level. No evidence of pulmonary embolism. Normal heart size. No pericardial effusion. Mediastinum/Nodes: No mass or adenopathy Lungs/Pleura: There is no edema, consolidation, effusion, or  pneumothorax. Upper Abdomen: Negative Musculoskeletal: Multifocal sclerotic lesion most pronounced at the T4, T9, T11, T12, and sternal levels, long-standing based on prior imaging with prior PET and MRI obtained. Compared to 2022 MRI no convincing progression. Review of the MIP images confirms the above findings. IMPRESSION: 1. Negative for pulmonary embolism or other acute finding. 2. Long-standing sclerotic bone lesions. No adverse interval evolution. Electronically Signed   By: Tiburcio Pea M.D.   On: 01/11/2023 06:29   DG Chest 2 View  Result Date: 01/11/2023 CLINICAL DATA:  Increasing shortness of breath for 3 days EXAM: CHEST - 2 VIEW COMPARISON:  01/08/2023 FINDINGS: Normal heart size and mediastinal contours. No acute infiltrate or edema. No effusion or pneumothorax. No acute osseous findings. IMPRESSION: Negative chest. Electronically Signed   By: Tiburcio Pea M.D.   On:  01/11/2023 05:24    Procedures Procedures    Medications Ordered in ED Medications  sodium chloride 0.9 % bolus 1,000 mL (has no administration in time range)  pantoprazole (PROTONIX) EC tablet 40 mg (has no administration in time range)  Iron (Ferrous Sulfate) TABS 325 mg (has no administration in time range)  enoxaparin (LOVENOX) injection 50 mg (has no administration in time range)  acetaminophen (TYLENOL) tablet 650 mg (has no administration in time range)    Or  acetaminophen (TYLENOL) suppository 650 mg (has no administration in time range)  ondansetron (ZOFRAN) tablet 4 mg (has no administration in time range)    Or  ondansetron (ZOFRAN) injection 4 mg (has no administration in time range)  albuterol (PROVENTIL) (2.5 MG/3ML) 0.083% nebulizer solution 2.5 mg (has no administration in time range)  iohexol (OMNIPAQUE) 350 MG/ML injection 100 mL (100 mLs Intravenous Contrast Given 01/11/23 0530)  LORazepam (ATIVAN) injection 0.5 mg (0.5 mg Intravenous Given 01/11/23 0511)  sodium chloride 0.9 % bolus 1,000  mL (1,000 mLs Intravenous New Bag/Given 01/11/23 0555)    ED Course/ Medical Decision Making/ A&P                                 Medical Decision Making Amount and/or Complexity of Data Reviewed Labs: ordered. Radiology: ordered.  Risk Prescription drug management. Decision regarding hospitalization.   This patient presents to the ED for concern of dyspnea, this involves an extensive number of treatment options, and is a complaint that carries with it a high risk of complications and morbidity.  The differential diagnosis includes PE, ACS, dissection, pneumonia,    Additional history obtained:  Additional history obtained from N/A External records from outside source obtained and reviewed including PCP notes   Co morbidities that complicate the patient evaluation  Sarcoidosis anxiety,  Social Determinants of Health:  N/A    Lab Tests:  I Ordered, and personally interpreted labs.  The pertinent results include: CBC is unremarkable, BMP reveals sodium 131, potassium 3.4, CO2 of 19, glucose 101, per troponin is 4, D-dimer is elevated 0.6, CK is 383, urine drug screen positive for cannabis, lactic 1.3,   Imaging Studies ordered:  I ordered imaging studies including chest x-ray, CTA chest I independently visualized and interpreted imaging which showed both chest and CT scan are negative for acute findings I agree with the radiologist interpretation   Cardiac Monitoring:  The patient was maintained on a cardiac monitor.  I personally viewed and interpreted the cardiac monitored which showed an underlying rhythm of: Sinus tach without signs of ischemia   Medicines ordered and prescription drug management:  I ordered medication including fluids, Ativan I have reviewed the patients home medicines and have made adjustments as needed  Critical Interventions:  N/A   Reevaluation:  Presents with dyspnea, presents tachycardic, triage obtain lab workup and imaging,  patient has elevated D-dimer, will send down for CTA chest for possible PE  CT scan is negative, will add on CK, lab your drug screen, lactic for further evaluation  Lab was unremarkable, despite 2 L of fluids, patient still remains tachycardic, recommend admission for further observation patient agreed this plan.  Consultations Obtained:  I requested consultation with the Dr. Bryn Gulling,  and discussed lab and imaging findings as well as pertinent plan - they recommend: He will admit the patient    Test Considered:  N/A    Rule out I have low  suspicion for ACS as history is atypical, patient has no cardiac history, EKG was sinus rhythm without signs of ischemia, patient had a negative troponin.  Low suspicion for PE CTA is negative low suspicion for AAA or aortic dissection as history is atypical, patient has low risk factors.  Suspicion for sepsis is low at this time nontoxic-appearing, afebrile, no leukocytosis, negative lactic.  Suspicion for rhabdo is low at this time CK is unremarkable.    Dispostion and problem list  After consideration of the diagnostic results and the patients response to treatment, I feel that the patent would benefit from admission.  Dyspnea-unclear etiology, patient had dyspnea in the past, she has had echocardiogram which is unremarkable, patient likely benefit from repeat echocardiogram and evaluation by cardiology Tachycardia-unclear etiology, patient need further workup continue monitoring May benefit from beta-blockers as well as cardiology evaluation.            Final Clinical Impression(s) / ED Diagnoses Final diagnoses:  Tachycardia  Dyspnea, unspecified type    Rx / DC Orders ED Discharge Orders     None         Barnie Del 01/11/23 0750    Palumbo, April, MD 01/11/23 2311

## 2023-01-11 NOTE — ED Notes (Signed)
Sent urine to lab after POC pregnancy test

## 2023-01-11 NOTE — ED Notes (Signed)
Sent down a pink, gold, red, dark green to the lab

## 2023-01-11 NOTE — Plan of Care (Signed)
  Problem: Education: Goal: Knowledge of General Education information will improve Description: Including pain rating scale, medication(s)/side effects and non-pharmacologic comfort measures Outcome: Progressing   Problem: Health Behavior/Discharge Planning: Goal: Ability to manage health-related needs will improve Outcome: Progressing   Problem: Activity: Goal: Risk for activity intolerance will decrease Outcome: Progressing   Problem: Nutrition: Goal: Adequate nutrition will be maintained Outcome: Progressing   Problem: Coping: Goal: Level of anxiety will decrease Outcome: Progressing   Problem: Elimination: Goal: Will not experience complications related to bowel motility Outcome: Progressing Goal: Will not experience complications related to urinary retention Outcome: Progressing   Problem: Skin Integrity: Goal: Risk for impaired skin integrity will decrease Outcome: Progressing

## 2023-01-11 NOTE — ED Notes (Signed)
ED TO INPATIENT HANDOFF REPORT  ED Nurse Name and Phone #: Dorthula Perfect RN    Name/Age/Gender Jodi Norman 27 y.o. female Room/Bed: WA13/WA13  Code Status   Code Status: Full Code  Home/SNF/Other  Patient oriented to: self, place, time, and situation Is this baseline? Yes   Triage Complete: Triage complete  Chief Complaint Tachycardia [R00.0]  Triage Note Says she has been having shortness of breath all day. First noticed earlier as she was walking her dog and become so short of breath she couldn't go up the steps to her apartment.   Just finished a 55 hour fast and has not been drinking much fluids at all.   Hx of Sarcoidosis and says she is high risk of DVT's but tries to stay active.    Allergies No Known Allergies  Level of Care/Admitting Diagnosis ED Disposition     ED Disposition  Admit   Condition  --   Comment  Hospital Area: [facility] [[id number]]  Level of Care: Telemetry [5]  Admit to tele based on following criteria: Complex arrhythmia (Bradycardia/Tachycardia)  May place patient in observation at [facility] or [facility] if equivalent level of care is available:: Yes  Covid Evaluation: Asymptomatic - no recent exposure (last 10 days) testing not required  Diagnosis: Tachycardia [242249]  Admitting Physician: Maryln Gottron [[id number]]  Attending Physician: Kirby Crigler, MIR [email or web address] [[id number]]          B Medical/Surgery History Past Medical History:  Diagnosis Date   Generalized anxiety disorder 06/24/2020   GERD (gastroesophageal reflux disease)    Past Surgical History:  Procedure Laterality Date   CESAREAN SECTION     MOUTH SURGERY  05/2021   UPPER GI ENDOSCOPY  04/27/2021     A IV Location/Drains/Wounds Patient Lines/Drains/Airways Status     Active Line/Drains/Airways     Name Placement date Placement time Site Days   Peripheral IV 01/11/23 20 G Right Antecubital 01/11/23  0410  Antecubital  less than 1   Wound  / Incision (Open or Dehisced) 04/27/21 Puncture Neck Right 04/27/21  0908  Neck  624            Intake/Output Last 24 hours No intake or output data in the 24 hours ending 01/11/23 0807  Labs/Imaging Results for orders placed or performed during the hospital encounter of 01/11/23 (from the past 48 hour(s))  CBG monitoring, ED     Status: None   Collection Time: 01/11/23  1:45 AM  Result Value Ref Range   Glucose-Capillary 82 70 - 99 mg/dL    Comment: Glucose reference range applies only to samples taken after fasting for at least 8 hours.  CBC with Differential     Status: None   Collection Time: 01/11/23  2:12 AM  Result Value Ref Range   WBC 8.6 4.0 - 10.5 K/uL   RBC 4.48 3.87 - 5.11 MIL/uL   Hemoglobin 12.7 12.0 - 15.0 g/dL   HCT 19.1 47.8 - 29.5 %   MCV 82.8 80.0 - 100.0 fL   MCH 28.3 26.0 - 34.0 pg   MCHC 34.2 30.0 - 36.0 g/dL   RDW 62.1 30.8 - 65.7 %   Platelets 292 150 - 400 K/uL   nRBC 0.0 0.0 - 0.2 %   Neutrophils Relative % 50 %   Neutro Abs 4.3 1.7 - 7.7 K/uL   Lymphocytes Relative 39 %   Lymphs Abs 3.3 0.7 - 4.0 K/uL   Monocytes Relative 10 %  Monocytes Absolute 0.8 0.1 - 1.0 K/uL   Eosinophils Relative 1 %   Eosinophils Absolute 0.1 0.0 - 0.5 K/uL   Basophils Relative 0 %   Basophils Absolute 0.0 0.0 - 0.1 K/uL   Immature Granulocytes 0 %   Abs Immature Granulocytes 0.03 0.00 - 0.07 K/uL    Comment: Performed at [facility], [address], [place], [place] [address]  Basic metabolic panel     Status: Abnormal   Collection Time: 01/11/23  2:12 AM  Result Value Ref Range   Sodium 131 (L) 135 - 145 mmol/L   Potassium 3.4 (L) 3.5 - 5.1 mmol/L   Chloride 99 98 - 111 mmol/L   CO2 19 (L) 22 - 32 mmol/L   Glucose, Bld 101 (H) 70 - 99 mg/dL    Comment: Glucose reference range applies only to samples taken after fasting for at least 8 hours.   BUN 11 6 - 20 mg/dL   Creatinine, Ser 4.78 0.44 - 1.00 mg/dL   Calcium 9.2 8.9 - 29.5 mg/dL    GFR, Estimated >62 >13 mL/min    Comment: (NOTE) Calculated using the CKD-EPI Creatinine Equation (2021)    Anion gap 13 5 - 15    Comment: Performed at [facility], [address], [place], [place] [address]  D-dimer, quantitative     Status: Abnormal   Collection Time: 01/11/23  2:12 AM  Result Value Ref Range   D-Dimer, Quant 0.60 (H) 0.00 - 0.50 ug/mL-FEU    Comment: (NOTE) At the manufacturer cut-off value of 0.5 g/mL FEU, this assay has a negative predictive value of 95-100%.This assay is intended for use in conjunction with a clinical pretest probability (PTP) assessment model to exclude pulmonary embolism (PE) and deep venous thrombosis (DVT) in outpatients suspected of PE or DVT. Results should be correlated with clinical presentation. Performed at [facility], [address] [place], [place] [address]   TSH     Status: None   Collection Time: 01/11/23  2:12 AM  Result Value Ref Range   TSH 2.754 0.350 - 4.500 uIU/mL    Comment: Performed by a 3rd Generation assay with a functional sensitivity of <=0.01 uIU/mL. Performed at [facility], [address] [place], [place] [address]   POC Urine Pregnancy, ED (not at [facility] or [facility])     Status: None   Collection Time: 01/11/23  2:24 AM  Result Value Ref Range   Preg Test, Ur NEGATIVE NEGATIVE    Comment:        THE SENSITIVITY OF THIS METHODOLOGY IS >24 mIU/mL   Troponin I (High Sensitivity)     Status: None   Collection Time: 01/11/23  4:40 AM  Result Value Ref Range   Troponin I (High Sensitivity) 4 <18 ng/L    Comment: (NOTE) Elevated high sensitivity troponin I (hsTnI) values and significant  changes across serial measurements may suggest ACS but many other  chronic and acute conditions are known to elevate hsTnI results.  Refer to the "Links" section for chest pain algorithms and additional  guidance. Performed at [facility], [address], [place], [place] [address]   CK     Status: Abnormal   Collection Time: 01/11/23  4:40 AM  Result Value Ref Range   Total CK 383 (H) 38 - 234 U/L    Comment: Performed at [facility], [address] [place], [place] [address]  I-Stat  CG4 Lactic Acid     Status: None   Collection Time: 01/11/23  6:14 AM  Result Value Ref Range   Lactic Acid, Venous 1.3 0.5 - 1.9 mmol/L  Urinalysis, Routine w reflex microscopic -Urine, Clean Catch     Status: Abnormal   Collection Time: 01/11/23  6:21 AM  Result Value Ref Range   Color, Urine YELLOW YELLOW   APPearance CLEAR CLEAR   Specific Gravity, Urine >1.046 (H) 1.005 - 1.030   pH 5.0 5.0 - 8.0   Glucose, UA NEGATIVE NEGATIVE mg/dL   Hgb urine dipstick NEGATIVE NEGATIVE   Bilirubin Urine NEGATIVE NEGATIVE   Ketones, ur 80 (A) NEGATIVE mg/dL   Protein, ur NEGATIVE NEGATIVE mg/dL   Nitrite NEGATIVE NEGATIVE   Leukocytes,Ua NEGATIVE NEGATIVE    Comment: Performed at [facility], [address] [place], [place] [address]  Urine rapid drug screen (hosp performed)     Status: Abnormal   Collection Time: 01/11/23  6:21 AM  Result Value Ref Range   Opiates NONE DETECTED NONE DETECTED   Cocaine NONE DETECTED NONE DETECTED   Benzodiazepines NONE DETECTED NONE DETECTED   Amphetamines NONE DETECTED NONE DETECTED   Tetrahydrocannabinol POSITIVE (A) NONE DETECTED   Barbiturates NONE DETECTED NONE DETECTED    Comment: (NOTE) DRUG SCREEN FOR MEDICAL PURPOSES ONLY.  IF CONFIRMATION IS NEEDED FOR ANY PURPOSE, NOTIFY LAB WITHIN 5 DAYS.  LOWEST DETECTABLE LIMITS FOR URINE DRUG SCREEN Drug Class                     Cutoff (ng/mL) Amphetamine and metabolites    1000 Barbiturate and metabolites    200 Benzodiazepine                 200 Opiates and metabolites        300 Cocaine and metabolites        300 THC                            50 Performed at [facility], [address] [place], [place] [address]   Troponin I (High Sensitivity)     Status: None   Collection Time: 01/11/23  6:35 AM  Result Value Ref Range   Troponin I (High Sensitivity) 4 <18 ng/L    Comment: (NOTE) Elevated high sensitivity troponin I (hsTnI) values and significant  changes across serial measurements may suggest ACS but many other  chronic and acute conditions are known to elevate hsTnI results.  Refer to the "Links" section for chest pain algorithms and additional  guidance. Performed at [facility], [address] [place], [place] [address]    CT Angio Chest PE W and/or Wo Contrast  Result Date: 01/11/2023 CLINICAL DATA:  Pulmonary embolism suspected. High probability. Shortness of breath all day. EXAM: CT ANGIOGRAPHY CHEST WITH CONTRAST TECHNIQUE: Multidetector CT imaging of the chest was performed using the standard protocol during bolus administration of intravenous contrast. Multiplanar CT image reconstructions and MIPs were obtained to evaluate the vascular anatomy. RADIATION DOSE REDUCTION: This exam was performed according to the departmental dose-optimization program which includes automated exposure control, adjustment of the mA and/or kV according to patient size and/or use of iterative reconstruction technique. CONTRAST:  OMNIPAQUE IOHEXOL 350 MG/ML SOLN COMPARISON:  PET CT 11/27/2021 FINDINGS: Cardiovascular: Satisfactory opacification of the pulmonary arteries to the segmental level. No evidence of pulmonary embolism. Normal heart  size. No pericardial effusion. Mediastinum/Nodes: No mass or adenopathy Lungs/Pleura: There is no edema, consolidation, effusion, or pneumothorax. Upper Abdomen: Negative Musculoskeletal: Multifocal sclerotic lesion most pronounced at the T4, T9, T11, T12, and sternal levels, long-standing based on prior imaging with prior PET and MRI obtained. Compared to 2022 MRI no convincing progression. Review of the MIP images confirms the  above findings. IMPRESSION: 1. Negative for pulmonary embolism or other acute finding. 2. Long-standing sclerotic bone lesions. No adverse interval evolution. Electronically Signed   By: Tiburcio Pea M.D.   On: 01/11/2023 06:29   DG Chest 2 View  Result Date: 01/11/2023 CLINICAL DATA:  Increasing shortness of breath for 3 days EXAM: CHEST - 2 VIEW COMPARISON:  01/08/2023 FINDINGS: Normal heart size and mediastinal contours. No acute infiltrate or edema. No effusion or pneumothorax. No acute osseous findings. IMPRESSION: Negative chest. Electronically Signed   By: Tiburcio Pea M.D.   On: 01/11/2023 05:24    Pending Labs Unresulted Labs (From admission, onward)     Start     Ordered   01/12/23 0500  Basic metabolic panel  Tomorrow morning,   R        01/11/23 0742   01/12/23 0500  CBC  Tomorrow morning,   R        01/11/23 0742   01/11/23 0756  Magnesium  Add-on,   AD        01/11/23 0755            Vitals/Pain Today's Vitals   01/11/23 0157 01/11/23 0417 01/11/23 0519 01/11/23 0524  BP:  98/83  126/78  Pulse: (!) 129 96  (!) 119  Resp:  17  17  Temp:   98.7 F (37.1 C)   TempSrc:   Oral   SpO2:  100%  97%  Weight: 108.9 kg     Height: 5\' 6"  (1.676 m)     PainSc:        Isolation Precautions No active isolations  Medications Medications  sodium chloride 0.9 % bolus 1,000 mL (has no administration in time range)  pantoprazole (PROTONIX) EC tablet 40 mg (has no administration in time range)  Iron (Ferrous Sulfate) TABS 325 mg (has no administration in time range)  enoxaparin (LOVENOX) injection 50 mg (has no administration in time range)  acetaminophen (TYLENOL) tablet 650 mg (has no administration in time range)    Or  acetaminophen (TYLENOL) suppository 650 mg (has no administration in time range)  ondansetron (ZOFRAN) tablet 4 mg (has no administration in time range)    Or  ondansetron (ZOFRAN) injection 4 mg (has no administration in time range)  albuterol  (PROVENTIL) (2.5 MG/3ML) 0.083% nebulizer solution 2.5 mg (has no administration in time range)  iohexol (OMNIPAQUE) 350 MG/ML injection 100 mL (100 mLs Intravenous Contrast Given 01/11/23 0530)  LORazepam (ATIVAN) injection 0.5 mg (0.5 mg Intravenous Given 01/11/23 0511)  sodium chloride 0.9 % bolus 1,000 mL (1,000 mLs Intravenous New Bag/Given 01/11/23 0555)    Mobility walks     Focused Assessments Cardiac Assessment Handoff:    Lab Results  Component Value Date   CKTOTAL 383 (H) 01/11/2023   Lab Results  Component Value Date   DDIMER 0.60 (H) 01/11/2023   Does the Patient currently have chest pain? No    R Recommendations: See Admitting Provider Note  Report given to:   Additional Notes:

## 2023-01-12 ENCOUNTER — Observation Stay (HOSPITAL_COMMUNITY): Payer: Commercial Managed Care - PPO

## 2023-01-12 ENCOUNTER — Encounter (HOSPITAL_COMMUNITY): Payer: Self-pay

## 2023-01-12 DIAGNOSIS — R0602 Shortness of breath: Secondary | ICD-10-CM

## 2023-01-12 DIAGNOSIS — R000 Tachycardia, unspecified: Secondary | ICD-10-CM | POA: Diagnosis not present

## 2023-01-12 DIAGNOSIS — R0600 Dyspnea, unspecified: Secondary | ICD-10-CM | POA: Diagnosis not present

## 2023-01-12 LAB — BASIC METABOLIC PANEL
Anion gap: 8 (ref 5–15)
BUN: <5 mg/dL — ABNORMAL LOW (ref 6–20)
CO2: 21 mmol/L — ABNORMAL LOW (ref 22–32)
Calcium: 8.7 mg/dL — ABNORMAL LOW (ref 8.9–10.3)
Chloride: 108 mmol/L (ref 98–111)
Creatinine, Ser: 0.65 mg/dL (ref 0.44–1.00)
GFR, Estimated: >60 mL/min (ref >60)
Glucose, Bld: 93 mg/dL (ref 70–99)
Potassium: 4 mmol/L (ref 3.5–5.1)
Sodium: 137 mmol/L (ref 135–145)

## 2023-01-12 LAB — ECHOCARDIOGRAM COMPLETE
Area-P 1/2: 3.12 cm2
Height: 66 in
S' Lateral: 3.2 cm
Weight: 3840 oz

## 2023-01-12 LAB — CBC
HCT: 32.6 % — ABNORMAL LOW (ref 36.0–46.0)
Hemoglobin: 10.6 g/dL — ABNORMAL LOW (ref 12.0–15.0)
MCH: 28.2 pg (ref 26.0–34.0)
MCHC: 32.5 g/dL (ref 30.0–36.0)
MCV: 86.7 fL (ref 80.0–100.0)
Platelets: 212 10*3/uL (ref 150–400)
RBC: 3.76 MIL/uL — ABNORMAL LOW (ref 3.87–5.11)
RDW: 13.9 % (ref 11.5–15.5)
WBC: 6.3 10*3/uL (ref 4.0–10.5)
nRBC: 0 % (ref 0.0–0.2)

## 2023-01-12 MED ORDER — PERFLUTREN LIPID MICROSPHERE
1.0000 mL | INTRAVENOUS | Status: AC | PRN
  Administered 2023-01-12: 3 mL via INTRAVENOUS

## 2023-01-12 MED ORDER — FERROUS SULFATE 325 (65 FE) MG PO TABS
325.0000 mg | ORAL_TABLET | Freq: Every day | ORAL | Status: DC
  Administered 2023-01-12: 325 mg via ORAL
  Filled 2023-01-12: qty 1

## 2023-01-12 MED ORDER — IBUPROFEN 200 MG PO TABS
600.0000 mg | ORAL_TABLET | Freq: Once | ORAL | Status: AC
  Administered 2023-01-12: 600 mg via ORAL
  Filled 2023-01-12: qty 3

## 2023-01-12 NOTE — Hospital Course (Addendum)
26 y.o.F W/ history of sarcoidosis remote history of anxiety, GERD presented w/ shortness of breath, left-sided chest pain with nausea. She felt like this could be related to her GERD so has not eaten very much in the last 3 days or so.  She noticed little breathlessness when she tried to ambulate and worsened In ED: hr-109, rest of her vitals are unremarkable.  But while adjusting the sheets, heart rate goes up to the 130s.Per ER provider, when ambulating she became a little breathless, heart rate went into the 140s. EKG no acute findings. Lab work was done CBC and BMP essentially unremarkable.  TSH was normal, D-dimer 0.6 slightly abnormal>CTA was done which is negative for any acute process. Troponin has been negative x 2.  Due to persistent tachycardia patient is admitted.  Dr. Laveda Norman from cardiology consulted-advised adequate hydration.  Patient given 2 L bolus and admitted on 100 mL/h of normal saline.  Patient is clinically improved with IV fluid hydration orthostatic vitals negative.  Per request seen by cardiology echocardiogram ordered if unremarkable okay to discharge home

## 2023-01-12 NOTE — Discharge Summary (Signed)
Physician Discharge Summary  Jodi Norman VHQ:[id number] DOB: 1995/11/28 DOA: 01/11/2023  PCP: Donato Schultz, DO  Admit date: 01/11/2023 Discharge date: 01/12/2023 Recommendations for Outpatient Follow-up:  Follow up with PCP in 1 weeks-call for appointment Please obtain BMP/CBC in one week  Discharge Dispo: Home Discharge Condition: Stable Code Status:   Code Status: Full Code Diet recommendation:  Diet Order             Diet regular Room service appropriate? Yes; Fluid consistency: Thin  Diet effective now                    Brief/Interim Summary: 27 y.o.F W/ history of sarcoidosis remote history of anxiety, GERD presented w/ shortness of breath, left-sided chest pain with nausea. She felt like this could be related to her GERD so has not eaten very much in the last 3 days or so.  She noticed little breathlessness when she tried to ambulate and worsened In ED: hr-109, rest of her vitals are unremarkable.  But while adjusting the sheets, heart rate goes up to the 130s.Per ER provider, when ambulating she became a little breathless, heart rate went into the 140s. EKG no acute findings. Lab work was done CBC and BMP essentially unremarkable.  TSH was normal, D-dimer 0.6 slightly abnormal>CTA was done which is negative for any acute process. Troponin has been negative x 2.  Due to persistent tachycardia patient is admitted.  Dr. Laveda Norman from cardiology consulted-advised adequate hydration.  Patient given 2 L bolus and admitted on 100 mL/h of normal saline.  Patient is clinically improved with IV fluid hydration orthostatic vitals negative.  Per request seen by cardiology echocardiogram ordered if unremarkable okay to discharge home   Discharge Diagnoses:  Principal Problem:   Tachycardia  Hypovolemic hyponatremia Hypokalemia Metabolic acidosis Dehydration due to poor oral intake: Resolved with aggressive IV fluid hydration.  UA on admission with specific l gravity > 1.046 and  ketones positive 80-indicating volume depletion and dehydration.  Clinically improved with IV fluid hydration now tolerating diet no nausea vomiting  Sinus tachycardia: TSH stable D-dimer slightly up but CT negative.  Negative orthostatics, seen by cardiology echo unremarkable safe to discharge home . Tte lvef 60-65%, no RWMA,  UDS positive for THC  Chronic anemia: hemoglobin around 10 to 11 g range, initial hemoconcentrated at 12.72 dehydration improved to baseline with IV fluids  Consults: cardio Subjective: Alert awake oriented resting comfortably  Discharge Exam: Vitals:   01/12/23 1011 01/12/23 1253  BP: 135/83 (!) 151/55  Pulse:  (!) 58  Resp: 15 14  Temp:  98.1 F (36.7 C)  SpO2:  99%   General: Pt is alert, awake, not in acute distress Cardiovascular: RRR, S1/S2 +, no rubs, no gallops Respiratory: CTA bilaterally, no wheezing, no rhonchi Abdominal: Soft, NT, ND, bowel sounds + Extremities: no edema, no cyanosis  Discharge Instructions  Discharge Instructions     Discharge instructions   Complete by: As directed    Please call call MD or return to ER for similar or worsening recurring problem that brought you to hospital or if any fever,nausea/vomiting,abdominal pain, uncontrolled pain, chest pain,  shortness of breath or any other alarming symptoms.  Please follow-up your doctor as instructed in a week time and call the office for appointment.  Please avoid alcohol, smoking, or any other illicit substance and maintain healthy habits including taking your regular medications as prescribed.  You were cared for by a hospitalist during your  hospital stay. If you have any questions about your discharge medications or the care you received while you were in the hospital after you are discharged, you can call the unit and ask to speak with the hospitalist on call if the hospitalist that took care of you is not available.  Once you are discharged, your primary care  physician will handle any further medical issues. Please note that NO REFILLS for any discharge medications will be authorized once you are discharged, as it is imperative that you return to your primary care physician (or establish a relationship with a primary care physician if you do not have one) for your aftercare needs so that they can reassess your need for medications and monitor your lab values   Increase activity slowly   Complete by: As directed       Allergies as of 01/12/2023   No Known Allergies      Medication List     TAKE these medications    Iron (Ferrous Sulfate) 325 (65 Fe) MG Tabs Take 325 mg by mouth daily.   multivitamin capsule Take 1 capsule by mouth daily.   omeprazole 40 MG capsule Commonly known as: PRILOSEC Take 1 capsule (40 mg total) by mouth 2 (two) times daily.   ondansetron 4 MG tablet Commonly known as: Zofran Take 1 tablet (4 mg total) by mouth every 8 (eight) hours as needed for nausea or vomiting.   Vitamin D (Ergocalciferol) 1.25 MG (50000 UNIT) Caps capsule Commonly known as: DRISDOL Take 1 capsule (50,000 Units total) by mouth every 7 (seven) days.        No Known Allergies  The results of significant diagnostics from this hospitalization (including imaging, microbiology, ancillary and laboratory) are listed below for reference.    Microbiology: No results found for this or any previous visit (from the past 240 hour(s)).  Procedures/Studies: ECHOCARDIOGRAM COMPLETE  Result Date: 01/12/2023    ECHOCARDIOGRAM REPORT   Patient Name:   Jodi Norman Date of Exam: 01/12/2023 Medical Rec #:  [id number]      Height:       66.0 in Accession #:    [id number]     Weight:       240.0 lb Date of Birth:  21-Oct-1995     BSA:          2.161 m Patient Age:    27 years       BP:           135/85 mmHg Patient Gender: F              HR:           78 bpm. Exam Location:  Inpatient Procedure: 2D Echo, Cardiac Doppler, Color Doppler and Intracardiac             Opacification Agent Indications:    Dyspnea R06.00  History:        Patient has prior history of Echocardiogram examinations, most                 recent 04/27/2021.  Sonographer:    Harriette Bouillon RDCS Referring Phys: [id number] Dorothe Pea BRANCH IMPRESSIONS  1. Left ventricular ejection fraction, by estimation, is 60 to 65%. The left ventricle has normal function. The left ventricle has no regional wall motion abnormalities. Left ventricular diastolic parameters were normal.  2. Right ventricular systolic function is normal. The right ventricular size is normal.  3. The mitral valve is normal in structure. No  evidence of mitral valve regurgitation. No evidence of mitral stenosis.  4. The aortic valve is tricuspid. Aortic valve regurgitation is not visualized. No aortic stenosis is present.  5. The inferior vena cava is normal in size with greater than 50% respiratory variability, suggesting right atrial pressure of 3 mmHg. FINDINGS  Left Ventricle: Left ventricular ejection fraction, by estimation, is 60 to 65%. The left ventricle has normal function. The left ventricle has no regional wall motion abnormalities. Definity contrast agent was given IV to delineate the left ventricular  endocardial borders. The left ventricular internal cavity size was normal in size. There is no left ventricular hypertrophy. Left ventricular diastolic parameters were normal. Right Ventricle: The right ventricular size is normal. Right vetricular wall thickness was not well visualized. Right ventricular systolic function is normal. Left Atrium: Left atrial size was normal in size. Right Atrium: Right atrial size was normal in size. Pericardium: Trivial pericardial effusion is present. The pericardial effusion is anterior to the right ventricle. Mitral Valve: The mitral valve is normal in structure. No evidence of mitral valve regurgitation. No evidence of mitral valve stenosis. Tricuspid Valve: The tricuspid valve is normal in  structure. Tricuspid valve regurgitation is not demonstrated. No evidence of tricuspid stenosis. Aortic Valve: The aortic valve is tricuspid. Aortic valve regurgitation is not visualized. No aortic stenosis is present. Pulmonic Valve: The pulmonic valve was not well visualized. Pulmonic valve regurgitation is not visualized. No evidence of pulmonic stenosis. Aorta: The aortic root and ascending aorta are structurally normal, with no evidence of dilitation. Venous: The inferior vena cava is normal in size with greater than 50% respiratory variability, suggesting right atrial pressure of 3 mmHg. IAS/Shunts: No atrial level shunt detected by color flow Doppler.  LEFT VENTRICLE PLAX 2D LVIDd:         4.80 cm   Diastology LVIDs:         3.20 cm   LV e' medial:    11.10 cm/s LV PW:         0.70 cm   LV E/e' medial:  8.4 LV IVS:        0.70 cm   LV e' lateral:   17.60 cm/s LVOT diam:     1.90 cm   LV E/e' lateral: 5.3 LV SV:         64 LV SV Index:   30 LVOT Area:     2.84 cm  RIGHT VENTRICLE             IVC RV S prime:     14.80 cm/s  IVC diam: 1.30 cm TAPSE (M-mode): 1.9 cm LEFT ATRIUM             Index        RIGHT ATRIUM           Index LA diam:        3.80 cm 1.76 cm/m   RA Area:     13.90 cm LA Vol (A2C):   37.4 ml 17.31 ml/m  RA Volume:   34.90 ml  16.15 ml/m LA Vol (A4C):   37.4 ml 17.31 ml/m LA Biplane Vol: 37.5 ml 17.35 ml/m  AORTIC VALVE LVOT Vmax:   114.00 cm/s LVOT Vmean:  76.000 cm/s LVOT VTI:    0.225 m  AORTA Ao Root diam: 2.60 cm Ao Asc diam:  2.60 cm MITRAL VALVE MV Area (PHT): 3.12 cm    SHUNTS MV Decel Time: 243 msec    Systemic VTI:  0.22  m MV E velocity: 93.00 cm/s  Systemic Diam: 1.90 cm MV A velocity: 85.10 cm/s MV E/A ratio:  1.09 Dina Rich MD Electronically signed by Dina Rich MD Signature Date/Time: 01/12/2023/2:24:47 PM    Final    CT Angio Chest PE W and/or Wo Contrast  Result Date: 01/11/2023 CLINICAL DATA:  Pulmonary embolism suspected. High probability. Shortness of  breath all day. EXAM: CT ANGIOGRAPHY CHEST WITH CONTRAST TECHNIQUE: Multidetector CT imaging of the chest was performed using the standard protocol during bolus administration of intravenous contrast. Multiplanar CT image reconstructions and MIPs were obtained to evaluate the vascular anatomy. RADIATION DOSE REDUCTION: This exam was performed according to the departmental dose-optimization program which includes automated exposure control, adjustment of the mA and/or kV according to patient size and/or use of iterative reconstruction technique. CONTRAST:  OMNIPAQUE IOHEXOL 350 MG/ML SOLN COMPARISON:  PET CT 11/27/2021 FINDINGS: Cardiovascular: Satisfactory opacification of the pulmonary arteries to the segmental level. No evidence of pulmonary embolism. Normal heart size. No pericardial effusion. Mediastinum/Nodes: No mass or adenopathy Lungs/Pleura: There is no edema, consolidation, effusion, or pneumothorax. Upper Abdomen: Negative Musculoskeletal: Multifocal sclerotic lesion most pronounced at the T4, T9, T11, T12, and sternal levels, long-standing based on prior imaging with prior PET and MRI obtained. Compared to 2022 MRI no convincing progression. Review of the MIP images confirms the above findings. IMPRESSION: 1. Negative for pulmonary embolism or other acute finding. 2. Long-standing sclerotic bone lesions. No adverse interval evolution. Electronically Signed   By: Tiburcio Pea M.D.   On: 01/11/2023 06:29   DG Chest 2 View  Result Date: 01/11/2023 CLINICAL DATA:  Increasing shortness of breath for 3 days EXAM: CHEST - 2 VIEW COMPARISON:  01/08/2023 FINDINGS: Normal heart size and mediastinal contours. No acute infiltrate or edema. No effusion or pneumothorax. No acute osseous findings. IMPRESSION: Negative chest. Electronically Signed   By: Tiburcio Pea M.D.   On: 01/11/2023 05:24   DG Chest 2 View  Result Date: 01/08/2023 CLINICAL DATA:  Chest pain EXAM: CHEST - 2 VIEW COMPARISON:   03/30/2022 FINDINGS: Lungs are clear.  No pleural effusion or pneumothorax. The heart is normal in size. Visualized osseous structures are within normal limits. IMPRESSION: Normal chest radiographs. Electronically Signed   By: Charline Bills M.D.   On: 01/08/2023 00:30    Labs: BNP (last 3 results) No results for input(s): "BNP" in the last 8760 hours. Basic Metabolic Panel: Recent Labs  Lab 01/07/23 2044 01/08/23 1811 01/11/23 0212 01/11/23 0800 01/12/23 0352  NA 136 135 131*  --  137  K 3.8 3.9 3.4*  --  4.0  CL 104 100 99  --  108  CO2 23 26 19*  --  21*  GLUCOSE 96 84 101*  --  93  BUN 9 7 11   --  <5*  CREATININE 0.78 0.69 0.73  --  0.65  CALCIUM 9.1 9.7 9.2  --  8.7*  MG  --   --   --  2.1  --    Liver Function Tests: Recent Labs  Lab 01/07/23 2044 01/08/23 1811  AST 26 31  ALT 14 13  ALKPHOS 81 87  BILITOT 0.3 0.4  PROT 7.8 7.8  ALBUMIN 3.9 4.5   Recent Labs  Lab 01/07/23 2044  LIPASE 23   No results for input(s): "AMMONIA" in the last 168 hours. CBC: Recent Labs  Lab 01/07/23 2044 01/08/23 1811 01/11/23 0212 01/12/23 0352  WBC 7.1 6.2 8.6 6.3  NEUTROABS  --  2.9 4.3  --   HGB 11.0* 11.7* 12.7 10.6*  HCT 32.8* 35.4* 37.1 32.6*  MCV 82.8 85.2 82.8 86.7  PLT 265 280.0 292 212   Cardiac Enzymes: Recent Labs  Lab 01/11/23 0440  CKTOTAL 383*   BNP: Invalid input(s): "POCBNP" CBG: Recent Labs  Lab 01/11/23 0145  GLUCAP 82   D-Dimer Recent Labs    01/11/23 0212  DDIMER 0.60*   H Recent Labs    01/11/23 0212  TSH 2.754   Anemia work up No results for input(s): "VITAMINB12", "FOLATE", "FERRITIN", "TIBC", "IRON", "RETICCTPCT" in the last 72 hours. Urinalysis    Component Value Date/Time   COLORURINE YELLOW 01/11/2023 0621   APPEARANCEUR CLEAR 01/11/2023 0621   LABSPEC >1.046 (H) 01/11/2023 0621   PHURINE 5.0 01/11/2023 0621   GLUCOSEU NEGATIVE 01/11/2023 0621   HGBUR NEGATIVE 01/11/2023 0621   BILIRUBINUR NEGATIVE 01/11/2023  0621   BILIRUBINUR neg 01/08/2023 1801   KETONESUR 80 (A) 01/11/2023 0621   PROTEINUR NEGATIVE 01/11/2023 0621   UROBILINOGEN 0.2 01/08/2023 1801   NITRITE NEGATIVE 01/11/2023 0621   LEUKOCYTESUR NEGATIVE 01/11/2023 0621   Sepsis Labs Recent Labs  Lab 01/07/23 2044 01/08/23 1811 01/11/23 0212 01/12/23 0352  WBC 7.1 6.2 8.6 6.3   Microbiology No results found for this or any previous visit (from the past 240 hour(s)).   Time coordinating discharge: 25 minutes  SIGNED: Lanae Boast, MD  [facility] 01/12/2023, 2:28 PM  If 7PM-7AM, please contact night-coverage [email or web address]

## 2023-01-12 NOTE — Consult Note (Signed)
Cardiology Consultation   Patient ID: Jodi Norman MRN: [id number]; DOB: Sep 16, 1995  Admit date: 01/11/2023 Date of Consult: 01/12/2023  PCP:  Donato Schultz, DO   [facility] Providers Cardiologist:  Thomasene Ripple, DO        Patient Profile:   Jodi Norman is a 27 y.o. female with a hx of anxiety,obesity who is being seen 01/12/2023 for the evaluation of SOB at the request of Dr Lanae Boast.  History of Present Illness:   Jodi Norman 27 yo female history of anxiety, obesity, presents with SOB and elevated heart rates. Nonspecific left sided chest pain worst with palpation. Has been fasting last 3 days though endorses maintaining oral hydration. In ER elevated HRs 120s EKG showing sinus tach    WBC 8.6 Hgb 12.7 Plt 292 K 3.4 BUN 11 Cr 0.73 Ddimer 0.60 TSH 2.7 Preg neg CK 383 Lactic acid 1.3 UDS +THC Trop 4-->4 CXR no acute process CT PE: no PE  04/2021 echo: LVEF 50-55%, no WMAs, normal diastolic function, normal RV EKG sinus tach 120s  Past Medical History:  Diagnosis Date   Generalized anxiety disorder 06/24/2020   GERD (gastroesophageal reflux disease)     Past Surgical History:  Procedure Laterality Date   CESAREAN SECTION     MOUTH SURGERY  05/2021   UPPER GI ENDOSCOPY  04/27/2021      Inpatient Medications: Scheduled Meds:  enoxaparin (LOVENOX) injection  50 mg Subcutaneous Q24H   ferrous sulfate  325 mg Oral Q breakfast   pantoprazole  40 mg Oral Daily   Continuous Infusions:  sodium chloride 100 mL/hr at 01/12/23 0900   PRN Meds: acetaminophen **OR** acetaminophen, albuterol, ondansetron **OR** ondansetron (ZOFRAN) IV  Allergies:   No Known Allergies  Social History:   Social History   Socioeconomic History   Marital status: Single    Spouse name: Not on file   Number of children: Not on file   Years of education: Not on file   Highest education level: Some college, no degree  Occupational History   Occupation: Psychiatric nurse: [employer]  Tobacco Use   Smoking status: Never   Smokeless tobacco: Never  Vaping Use   Vaping status: Never Used  Substance and Sexual Activity   Alcohol use: Never   Drug use: Never   Sexual activity: Not Currently    Birth control/protection: None  Other Topics Concern   Not on file  Social History Narrative   Right handed   Social Determinants of Health   Financial Resource Strain: Medium Risk (01/07/2023)   Overall Financial Resource Strain (CARDIA)    Difficulty of Paying Living Expenses: Somewhat hard  Food Insecurity: No Food Insecurity (01/11/2023)   Hunger Vital Sign    Worried About Running Out of Food in the Last Year: Never true    Ran Out of Food in the Last Year: Never true  Recent Concern: Food Insecurity - Food Insecurity Present (01/07/2023)   Hunger Vital Sign    Worried About Radiation protection practitioner of Food in the Last Year: Often true    Ran Out of Food in the Last Year: Often true  Transportation Needs: No Transportation Needs (01/11/2023)   PRAPARE - Administrator, Civil Service (Medical): No    Lack of Transportation (Non-Medical): No  Physical Activity: Insufficiently Active (01/07/2023)   Exercise Vital Sign    Days of Exercise per Week: 5 days  Minutes of Exercise per Session: 10 min  Stress: Stress Concern Present (01/07/2023)   [employer] of Occupational Health - Occupational Stress Questionnaire    Feeling of Stress : To some extent  Social Connections: Moderately Isolated (01/07/2023)   Social Connection and Isolation Panel [NHANES]    Frequency of Communication with Friends and Family: More than three times a week    Frequency of Social Gatherings with Friends and Family: More than three times a week    Attends Religious Services: 1 to 4 times per year    Active Member of [employer] or Organizations: No    Attends Engineer, structural: Not on file    Marital Status: Never married  Intimate Partner  Violence: Unknown (01/11/2023)   Humiliation, Afraid, Rape, and Kick questionnaire    Fear of Current or Ex-Partner: Not on file    Emotionally Abused: No    Physically Abused: No    Sexually Abused: No    Family History:    Family History  Problem Relation Age of Onset   Hyperlipidemia Mother    Hypertension Mother    Hyperlipidemia Father    Hypertension Father    Diabetes Father    Colon cancer Neg Hx    Stomach cancer Neg Hx    Esophageal cancer Neg Hx    Pancreatic cancer Neg Hx    Colon polyps Neg Hx    Rectal cancer Neg Hx      ROS:  Please see the history of present illness.   All other ROS reviewed and negative.     Physical Exam/Data:   Vitals:   01/12/23 0600 01/12/23 0752 01/12/23 0800 01/12/23 0900  BP:      Pulse:      Resp: 13 16 (!) 22 (!) 23  Temp:      TempSrc:      SpO2:      Weight:      Height:        Intake/Output Summary (Last 24 hours) at 01/12/2023 0924 Last data filed at 01/12/2023 0900 Gross per 24 hour  Intake 3323.33 ml  Output --  Net 3323.33 ml      01/11/2023    1:57 AM 01/08/2023    5:22 PM 01/07/2023    8:40 PM  Last 3 Weights  Weight (lbs) 240 lb 240 lb 12.8 oz 240 lb  Weight (kg) 108.863 kg 109.226 kg 108.863 kg     Body mass index is 38.74 kg/m.  General:  Well nourished, well developed, in no acute distress HEENT: normal Neck: no JVD Vascular: No carotid bruits; Distal pulses 2+ bilaterally Cardiac:  normal S1, S2; RRR; no murmur  Lungs:  clear to auscultation bilaterally, no wheezing, rhonchi or rales  Abd: soft, nontender, no hepatomegaly  Ext: no edema Musculoskeletal:  No deformities, BUE and BLE strength normal and equal Skin: warm and dry  Neuro:  CNs 2-12 intact, no focal abnormalities noted Psych:  Normal affect    Laboratory Data:  High Sensitivity Troponin:   Recent Labs  Lab 01/11/23 0440 01/11/23 0635  TROPONINIHS 4 4     Chemistry Recent Labs  Lab 01/07/23 2044 01/08/23 1811  01/11/23 0212 01/11/23 0800 01/12/23 0352  NA 136 135 131*  --  137  K 3.8 3.9 3.4*  --  4.0  CL 104 100 99  --  108  CO2 23 26 19*  --  21*  GLUCOSE 96 84 101*  --  93  BUN  9 7 11   --  <5*  CREATININE 0.78 0.69 0.73  --  0.65  CALCIUM 9.1 9.7 9.2  --  8.7*  MG  --   --   --  2.1  --   GFRNONAA >60  --  >60  --  >60  ANIONGAP 9  --  13  --  8    Recent Labs  Lab 01/07/23 2044 01/08/23 1811  PROT 7.8 7.8  ALBUMIN 3.9 4.5  AST 26 31  ALT 14 13  ALKPHOS 81 87  BILITOT 0.3 0.4   Lipids  Recent Labs  Lab 01/08/23 1811  CHOL 161  TRIG 83.0  HDL 40.80  LDLCALC 104*  CHOLHDL 4    Hematology Recent Labs  Lab 01/07/23 2044 01/08/23 1811 01/11/23 0212 01/12/23 0352  WBC 7.1 6.2 8.6 6.3  RBC 3.96 4.16 4.48 3.76*  HGB 11.0* 11.7* 12.7 10.6*  HCT 32.8* 35.4* 37.1 32.6*  MCV 82.8 85.2 82.8 86.7  MCH 27.8  --  28.3 28.2  MCHC 33.5 33.0 34.2 32.5  RDW 14.1 15.0 13.7 13.9  PLT 265 280.0 292 212   Thyroid  Recent Labs  Lab 01/11/23 0212  TSH 2.754    BNPNo results for input(s): "BNP", "PROBNP" in the last 168 hours.  DDimer  Recent Labs  Lab 01/11/23 0212  DDIMER 0.60*     Radiology/Studies:  CT Angio Chest PE W and/or Wo Contrast  Result Date: 01/11/2023 CLINICAL DATA:  Pulmonary embolism suspected. High probability. Shortness of breath all day. EXAM: CT ANGIOGRAPHY CHEST WITH CONTRAST TECHNIQUE: Multidetector CT imaging of the chest was performed using the standard protocol during bolus administration of intravenous contrast. Multiplanar CT image reconstructions and MIPs were obtained to evaluate the vascular anatomy. RADIATION DOSE REDUCTION: This exam was performed according to the departmental dose-optimization program which includes automated exposure control, adjustment of the mA and/or kV according to patient size and/or use of iterative reconstruction technique. CONTRAST:  OMNIPAQUE IOHEXOL 350 MG/ML SOLN COMPARISON:  PET CT 11/27/2021 FINDINGS:  Cardiovascular: Satisfactory opacification of the pulmonary arteries to the segmental level. No evidence of pulmonary embolism. Normal heart size. No pericardial effusion. Mediastinum/Nodes: No mass or adenopathy Lungs/Pleura: There is no edema, consolidation, effusion, or pneumothorax. Upper Abdomen: Negative Musculoskeletal: Multifocal sclerotic lesion most pronounced at the T4, T9, T11, T12, and sternal levels, long-standing based on prior imaging with prior PET and MRI obtained. Compared to 2022 MRI no convincing progression. Review of the MIP images confirms the above findings. IMPRESSION: 1. Negative for pulmonary embolism or other acute finding. 2. Long-standing sclerotic bone lesions. No adverse interval evolution. Electronically Signed   By: Tiburcio Pea M.D.   On: 01/11/2023 06:29   DG Chest 2 View  Result Date: 01/11/2023 CLINICAL DATA:  Increasing shortness of breath for 3 days EXAM: CHEST - 2 VIEW COMPARISON:  01/08/2023 FINDINGS: Normal heart size and mediastinal contours. No acute infiltrate or edema. No effusion or pneumothorax. No acute osseous findings. IMPRESSION: Negative chest. Electronically Signed   By: Tiburcio Pea M.D.   On: 01/11/2023 05:24     Assessment and Plan:   1.SOB - CT PE benign. Lung parenchyma clear by CT and CXR. BNP  - 04/2021 echo benign. Can repeat study, if benign no further cardiac testing is planned. May be significant component of deconditioning.   2.Sinus tachycardia - resolved, unclear etiology. Physiological response to some systemic stressor, sinus tach is not a specific cardiac condition and requires no further  cardiac workup - from history had been fasting, Cr/BUN WNL but CBC trend with IVFs would suggest she was hemoconentrated and volume down on admission. Extensive workup by primary team for other causes has been benign including a CT PE - sinus tach resolved, no further evaluation is indicated.    If benign echo we will sign off  inpatient care, we will arrange f/u  For questions or updates, please contact [facility] Please consult [email or web address] for contact info under    Signed, Dina Rich, MD  01/12/2023 9:24 AM

## 2023-01-12 NOTE — Plan of Care (Signed)
  Problem: Education: Goal: Knowledge of General Education information will improve Description: Including pain rating scale, medication(s)/side effects and non-pharmacologic comfort measures Outcome: Adequate for Discharge   Problem: Education: Goal: Knowledge of General Education information will improve Description: Including pain rating scale, medication(s)/side effects and non-pharmacologic comfort measures Outcome: Adequate for Discharge   Problem: Health Behavior/Discharge Planning: Goal: Ability to manage health-related needs will improve Outcome: Adequate for Discharge   Problem: Education: Goal: Knowledge of General Education information will improve Description: Including pain rating scale, medication(s)/side effects and non-pharmacologic comfort measures Outcome: Adequate for Discharge

## 2023-01-12 NOTE — Progress Notes (Signed)
Patient provided discharge instructions and verbalized understanding. IV removed.  Patient discharged from unit.

## 2023-01-14 ENCOUNTER — Telehealth: Payer: Self-pay

## 2023-01-14 ENCOUNTER — Encounter: Payer: Self-pay | Admitting: Nurse Practitioner

## 2023-01-14 ENCOUNTER — Ambulatory Visit (INDEPENDENT_AMBULATORY_CARE_PROVIDER_SITE_OTHER): Payer: Commercial Managed Care - PPO | Admitting: Nurse Practitioner

## 2023-01-14 VITALS — BP 118/78 | HR 88 | Ht 66.0 in | Wt 236.0 lb

## 2023-01-14 DIAGNOSIS — R1012 Left upper quadrant pain: Secondary | ICD-10-CM | POA: Diagnosis not present

## 2023-01-14 DIAGNOSIS — R110 Nausea: Secondary | ICD-10-CM | POA: Diagnosis not present

## 2023-01-14 DIAGNOSIS — K625 Hemorrhage of anus and rectum: Secondary | ICD-10-CM

## 2023-01-14 DIAGNOSIS — R634 Abnormal weight loss: Secondary | ICD-10-CM

## 2023-01-14 MED ORDER — HYDROCORTISONE ACETATE 25 MG RE SUPP: 25.0000 mg | Freq: Every day | RECTAL | 1 refills | Status: DC

## 2023-01-14 NOTE — Progress Notes (Signed)
01/14/2023 Jodi Norman [id number] [birth year]   Chief Complaint: Left sided pain, N/V and weight loss  History of Present Illness: Jodi Norman is a 27 year old female with a past medical history of anxiety, pre-eclampsia, microcytic anemia, sarcoidosis and GERD. Past C section. Previously known by Dr. Christella Hartigan. She presents today for further evaluation regarding nausea without vomiting, decreased appetite, left upper abdominal pain that radiates into the left lateral chest and weight loss.  She endorses losing 33 lbs over the past few months. Eating does not trigger her left sided chest/LUQ pain.  She is passing a normal brown bowel movement most days with intermittent constipation.  She infrequently sees a small amount of bright red blood on the toilet tissue.  She has intermittent hemorrhoid swelling and discomfort and questions if her hemorrhoids can be rated or removed.  She presented to the EGD 01/08/2023 with left rib cage pain.  He also endorsed having nausea at that time.  Lipase and LFTs were normal.  X-ray was normal.  She was clinically stable and discharged home.  She presented to the ED 01/11/2023 with shortness of breath and left-sided chest pain with nausea. Heart rate in the ED went up to 130s to 140s.  EKG without acute findings. CTA was negative for any acute process.  WBC 8.6.  Hemoglobin 12.7 -> received 2L IV fluids -> repeat Hg 10.6. Normal iron levels.   Platelet 292.  Potassium 3.4.  BUN 11.  Creatinine 0.73.  TSH 2.7.  Troponin 4.  UDS positive for THC.  She received 2 L IV fluid and was admitted overnight for close observation.  She was hemodynamically stable and discharged home 01/12/2023.   She continues to have soreness to the left lateral chest area which is positional.  No pleuritic pain inspiration.  She denies chronic THC use, stated she used it once briefly prior to her ED visit.  He has a history of iron deficiency anemia.  She underwent an EGD and  colonoscopy 05/19/2021, the EGD showed evidence of acid reflux and a small hiatal hernia.  The colonoscopy showed small internal hemorrhoids otherwise was normal.  She was advised to schedule a screening colonoscopy at the age of 63.  She stopped taking ferrous sulfate 06/2022.  Labs 01/12/2023 showed a hemoglobin level of 10.6.  Normal iron panel 01/08/2023.  She is no longer taking Omeprazole.  She takes Ibuprofen 600 to 800 mg 1 tab twice daily during her menstrual cycle and occasionally for joint pain.     Latest Ref Rng & Units 01/12/2023    3:52 AM 01/11/2023    2:12 AM 01/08/2023    6:11 PM  CBC  WBC 4.0 - 10.5 K/uL 6.3  8.6  6.2   Hemoglobin 12.0 - 15.0 g/dL 78.2  95.6  21.3   Hematocrit 36.0 - 46.0 % 32.6  37.1  35.4   Platelets 150 - 400 K/uL 212  292  280.0    MCV 86.7.  Iron 132, iron saturation 32% and ferritin 16 on 01/08/2023     Latest Ref Rng & Units 01/12/2023    3:52 AM 01/11/2023    2:12 AM 01/08/2023    6:11 PM  CMP  Glucose 70 - 99 mg/dL 93  086  84   BUN 6 - 20 mg/dL 5  11  7    Creatinine 0.44 - 1.00 mg/dL 5.78  4.69  6.29   Sodium 135 - 145 mmol/L 137  131  135   Potassium 3.5 - 5.1 mmol/L 4.0  3.4  3.9   Chloride 98 - 111 mmol/L 108  99  100   CO2 22 - 32 mmol/L 21  19  26    Calcium 8.9 - 10.3 mg/dL 8.7  9.2  9.7   Total Protein 6.0 - 8.3 g/dL   7.8   Total Bilirubin 0.2 - 1.2 mg/dL   0.4   Alkaline Phos 39 - 117 U/L   87   AST 0 - 37 U/L   31   ALT 0 - 35 U/L   13     Bone marrow biopsy 07/22/2020: BONE MARROW, ASPIRATE, CLOT, CORE:  - Normocellular marrow with rare non-caseating granuloma, see comment.  - Benign appearing lymphoid aggregates.   PERIPHERAL BLOOD:  -  Mild normocytic anemia.  COMMENT:  The marrow is normocellular for age. In the clot section there is a rare  non-caseating granuloma with surrounding lymphocytes. This granuloma is  cut away on immunohistochemistry and special stains, although a few more  benign appearing lymphoid  aggregates are seen. There is no evidence of  carcinoma. While a rare granuloma is not specific, given the patient's  age and lytic lesions elsewhere, sarcoidosis is a possibility.   ECHO 06/2020: LVEF 50 to 55%.  GI PROCEDURES:  EGD 05/19/2021 by Dr. Christella Hartigan: - Acid, reflux related esophagitis.  - Small hiatal hernia.  - The examination was otherwise normal.  Colonoscopy 05/19/2021 by Dr. Christella Hartigan: - The entire examined colon is normal on direct and retroflexion views.  - No polyps or cancers.  - Minor rectal bleeding is likely from small intermittent hemorrhoids. - Screening colonoscopy age 27  Current Outpatient Medications on File Prior to Visit  Medication Sig Dispense Refill   Iron, Ferrous Sulfate, 325 (65 Fe) MG TABS Take 325 mg by mouth daily. 30 tablet 5   Multiple Vitamin (MULTIVITAMIN) capsule Take 1 capsule by mouth daily.     omeprazole (PRILOSEC) 40 MG capsule Take 1 capsule (40 mg total) by mouth 2 (two) times daily. (Patient not taking: Reported on 01/14/2023) 90 capsule 3   ondansetron (ZOFRAN) 4 MG tablet Take 1 tablet (4 mg total) by mouth every 8 (eight) hours as needed for nausea or vomiting. 20 tablet 0   Vitamin D, Ergocalciferol, (DRISDOL) 1.25 MG (50000 UNIT) CAPS capsule Take 1 capsule (50,000 Units total) by mouth every 7 (seven) days. 12 capsule 0   No current facility-administered medications on file prior to visit.   No Known Allergies  Current Medications, Allergies, Past Medical History, Past Surgical History, Family History and Social History were reviewed in [employer] record.  Review of Systems:   Constitutional: See HPI.  No night sweats or fevers. Respiratory: Negative for shortness of breath.   Cardiovascular: Negative for chest pain, palpitations and leg swelling.  Gastrointestinal: See HPI.  Musculoskeletal: Negative for back pain or muscle aches.  Neurological: Negative for dizziness, headaches or paresthesias.    Physical Exam:  BP 118/78   Pulse 88   Ht 5\' 6"  (1.676 m)   Wt 236 lb (107 kg)   LMP 12/20/2022 (Exact Date) Comment: negative urine pregnancy test 01/11/23  SpO2 98%   BMI 38.09 kg/m   Wt Readings from Last 3 Encounters:  01/14/23 236 lb (107 kg)  01/11/23 240 lb (108.9 kg)  01/08/23 240 lb 12.8 oz (109.2 kg)    General: 27 year old female in no acute distress. Head: Normocephalic and atraumatic.  Eyes: No scleral icterus. Conjunctiva pink . Ears: Normal auditory acuity. Mouth: Dentition intact. No ulcers or lesions.  Lungs: Clear throughout to auscultation. Heart: Regular rate and rhythm, no murmur. Abdomen: Soft, nondistended. Mild LUQ pain at the costal margin without rebound or guarding. No masses or hepatomegaly. Normal bowel sounds x 4 quadrants.  Rectal: Anterior external hemorrhoid without inflammation. Internal hemorrhoids palpated without prolapse. No rectal bleeding. No stool in the rectal vault. CMA Nabina present during exam.  Musculoskeletal: Symmetrical with no gross deformities. Extremities: No edema. Neurological: Alert oriented x 4. No focal deficits.  Psychological: Alert and cooperative. Normal mood and affect  Assessment and Recommendations:  Nausea without vomiting with associated weight loss -Omeprazole 20mg  take one capsule by mouth once daily for possible gastritis  -Check Diatherix H. Pylori stool antigen  -CTAP with oral and IV contrast to rule out intra-abdominal/pelvic pathology to explain her nausea and weight loss -Patient to contact our office if there is any chance of pregnancy prior to CTAP date -Avoid THC  LUQ and left lateral chest wall pain, positional, unrelated to eating, no pleuritic component. Normal LFTs and lipase level. Chest CTA unrevealing.  -See plan above  -Follow up with PCP  History of GERD, no active reflux  -See plan above   History of Sarcoidosis  -Follow-up with rheumatologist  Microcytic anemia, normal iron  panel. Anemic likely due to sarcoidosis. EGD/colonoscopy 04/2021 findings did not explain microcytic anemia. Bone marrow biopsy 06/2020 showed microcytic anemia with evidence of sarcoidosis.  -Follow up with PCP  Internal and external hemorrhoids, infrequent rectal bleeding -Anusol HC 25 mg suppository 1 PR nightly x 5 nights, 1 refill -Apply a small amount of Desitin inside the anal opening and to the external anal area three times daily as needed for anal or hemorrhoidal irritation/bleeding.    Note, patient stated she left her car running and needed to leave our office prior to receiving her discharge instructions. DD Marden Noble CMA will contact patient to facilitate scheduling CTAP and to send AVS to patient's mychart.

## 2023-01-14 NOTE — Transitions of Care (Post Inpatient/ED Visit) (Unsigned)
   01/14/2023  Name: Jodi Norman MRN: [id number] DOB: August 19, 1995  Today's TOC FU Call Status: Today's TOC FU Call Status:: Unsuccessful Call (1st Attempt) Unsuccessful Call (1st Attempt) Date: 01/14/23  Attempted to reach the patient regarding the most recent Inpatient/ED visit.  Follow Up Plan: Additional outreach attempts will be made to reach the patient to complete the Transitions of Care (Post Inpatient/ED visit) call.   Signature Karena Addison, LPN [facility] Nurse Health Advisor Direct Dial [phone]

## 2023-01-14 NOTE — Patient Instructions (Signed)
You have been scheduled for a CT scan of the abdomen and pelvis at [facility], 1st floor Radiology. You are scheduled on 01/18/23 at 5:00 pm. You should arrive 15 minutes prior to your appointment time for registration.   If you have any questions regarding your exam or if you need to reschedule, you may call [facility] at [phone] between the hours of 8:00 am and 5:00 pm, Monday-Friday.   Apply a small amount of Desitin inside the anal opening and to the external anal area three times daily as needed for anal or hemorrhoidal irritation/bleeding.   Continue Omeprazole 20 mg, take 1 by mouth daily for possible gastritis.  Follow up with your primary care physician regarding left lateral chest wall pain.  Your provider has ordered "[employer]athe[employer]" stool testing for you. You have received a kit from our office today containing all necessary supplies to complete this test. Please carefully read the stool collection instructions provided in the kit before opening the accompanying materials. In addition, be sure to place the label from the top left corner of the laboratory request sheet onto the "puritan opti-swab" tube that is supplied in the kit. This label should include your full name and date of birth. After completing the test, you should secure the purtian tube into the specimen biohazard bag. The laboratory request information sheet (including date and time of specimen collection) should be placed into the outside pocket of the specimen biohazard bag and returned to the [place] lab with 2 days of collection.   Due to recent changes in healthcare laws, you may see the results of your imaging and laboratory studies on MyChart before your provider has had a chance to review them.  We understand that in some cases there may be results that are confusing or concerning to you. Not all laboratory results come back in the same time frame and the provider may be waiting for multiple results  in order to interpret others.  Please give [place] 48 hours in order for your provider to thoroughly review all the results before contacting the office for clarification of your results.   Thank you for trusting me with your gastrointestinal care!   Alcide Evener, CRNP

## 2023-01-15 ENCOUNTER — Telehealth: Payer: Self-pay | Admitting: Family Medicine

## 2023-01-15 NOTE — Telephone Encounter (Signed)
Pt called stating that she is trying to get in for her CPE this week as she took off time from work. Per PCP schedule, no open availabilities are available for this. Advised pt of this and she stated "I don't have any more time to take of for the year so I need to get it done this week. Is there no way you can fit me in for this?" Advised a message would be sent back to see about getting her in for this Pt would like a call back when info is available.

## 2023-01-15 NOTE — Transitions of Care (Post Inpatient/ED Visit) (Signed)
   01/15/2023  Name: Jodi Norman MRN: [id number] DOB: [birth year]  Today's TOC FU Call Status: Today's TOC FU Call Status:: Successful TOC FU Call Completed Unsuccessful Call (1st Attempt) Date: 01/14/23 [facility] FU Call Complete Date: 01/15/23  Transition Care Management Follow-up Telephone Call Date of Discharge: 01/12/23 Discharge Facility: [facility]) Type of Discharge: Inpatient Admission Primary Inpatient Discharge Diagnosis:: tachycardia How have you been since you were released from the hospital?: Better Any questions or concerns?: No  Items Reviewed: Did you receive and understand the discharge instructions provided?: Yes Medications obtained,verified, and reconciled?: Yes (Medications Reviewed) Any new allergies since your discharge?: No Dietary orders reviewed?: Yes Do you have support at home?: No  Medications Reviewed Today: Medications Reviewed Today     Reviewed by Karena Addison, LPN (Licensed Practical Nurse) on 01/15/23 at 1650  Med List Status: <None>   Medication Order Taking? Sig Documenting Provider Last Dose Status Informant  hydrocortisone (ANUSOL-HC) 25 MG suppository [id number]  Place 1 suppository (25 mg total) rectally at bedtime. Arnaldo Natal, NP  Active   Iron, Ferrous Sulfate, 325 (65 Fe) MG TABS [id number] No Take 325 mg by mouth daily. Zola Button, [place]ling Congress, DO UNK Active Self, Pharmacy Records  Multiple Vitamin (MULTIVITAMIN) capsule [id number] No Take 1 capsule by mouth daily. [provider] Past Week 1000 Active Self, Pharmacy Records  omeprazole (PRILOSEC) 40 MG capsule [id number] No Take 1 capsule (40 mg total) by mouth 2 (two) times daily.  Patient not taking: Reported on 01/14/2023   Donato Schultz, DO Not Taking Active Self, Pharmacy Records  ondansetron [facility]) 4 MG tablet [id number] No Take 1 tablet (4 mg total) by mouth every 8 (eight) hours as needed for nausea or vomiting. Zola Button, Grayling Congress, DO UNK  Active Self, Pharmacy Records  Vitamin D, Ergocalciferol, (DRISDOL) 1.25 MG (50000 UNIT) CAPS capsule [id number] No Take 1 capsule (50,000 Units total) by mouth every 7 (seven) days. Sharlene Dory, DO 01/07/2023 Active Self, Pharmacy Records            Home Care and Equipment/Supplies: Were Home Health Services Ordered?: NA Any new equipment or medical supplies ordered?: NA  Functional Questionnaire: Do you need assistance with bathing/showering or dressing?: No Do you need assistance with meal preparation?: No Do you need assistance with eating?: No Do you have difficulty maintaining continence: No Do you need assistance with getting out of bed/getting out of a chair/moving?: No Do you have difficulty managing or taking your medications?: No  Follow up appointments reviewed: PCP Follow-up appointment confirmed?: No (declined) MD Provider Line Number:[phone] Given: No Specialist Hospital Follow-up appointment confirmed?: NA Do you need transportation to your follow-up appointment?: No Do you understand care options if your condition(s) worsen?: Yes-patient verbalized understanding    SIGNATURE Karena Addison, LPN [facility] Nurse Health Advisor Direct Dial [phone]

## 2023-01-16 NOTE — Telephone Encounter (Signed)
Pt was called and scheduled for 8.22.24 @ 3:40p

## 2023-01-17 ENCOUNTER — Ambulatory Visit (INDEPENDENT_AMBULATORY_CARE_PROVIDER_SITE_OTHER): Payer: Commercial Managed Care - PPO | Admitting: Family Medicine

## 2023-01-17 ENCOUNTER — Encounter: Payer: Self-pay | Admitting: Family Medicine

## 2023-01-17 VITALS — BP 108/80 | HR 87 | Temp 98.5°F | Resp 18 | Ht 66.0 in | Wt 235.6 lb

## 2023-01-17 DIAGNOSIS — Z0000 Encounter for general adult medical examination without abnormal findings: Secondary | ICD-10-CM | POA: Insufficient documentation

## 2023-01-17 DIAGNOSIS — N946 Dysmenorrhea, unspecified: Secondary | ICD-10-CM | POA: Insufficient documentation

## 2023-01-17 DIAGNOSIS — D509 Iron deficiency anemia, unspecified: Secondary | ICD-10-CM | POA: Diagnosis not present

## 2023-01-17 MED ORDER — IBUPROFEN 800 MG PO TABS
800.0000 mg | ORAL_TABLET | Freq: Three times a day (TID) | ORAL | 3 refills | Status: DC | PRN
Start: 2023-01-17 — End: 2023-05-06

## 2023-01-17 NOTE — Assessment & Plan Note (Signed)
Ghm utd Check labs  See AVS  Health Maintenance  Topic Date Due   COVID-19 Vaccine (1) Never done   HPV VACCINES (1 - Risk 3-dose series) Never done   PAP-Cervical Cytology Screening  Never done   INFLUENZA VACCINE  12/27/2022   PAP SMEAR-Modifier  11/02/2023   DTaP/Tdap/Td (8 - Td or Tdap) 03/03/2028   Hepatitis C Screening  Completed   HIV Screening  Completed

## 2023-01-17 NOTE — Progress Notes (Signed)
Established Patient Office Visit  Subjective   Patient ID: Jodi Norman, female    DOB: 1996/02/23  Age: 27 y.o. MRN: [id number]  Chief Complaint  Patient presents with   Annual Exam    HPI Discussed the use of AI scribe software for clinical note transcription with the patient, who gave verbal consent to proceed.  History of Present Illness   The patient, with a history of anemia and dehydration, presents for a physical exam to obtain a work clearance letter. She was recently hospitalized for tachycardia and received intravenous fluids. During the hospitalization, her hemoglobin dropped from 12.7 to 10.6, which she attributes to the intravenous fluids. She is currently menstruating and has heavy periods, which she believes may further lower her hemoglobin. She reports frequent urination and questions how she could be dehydrated when she drinks a lot of water. She also reports abdominal pain due to her period and requests a prescription for ibuprofen 800mg . She recently had a stool test ordered by her gastroenterologist to check for infection or gastritis.      Patient Active Problem List   Diagnosis Date Noted   Preventative health care 01/17/2023   Dysmenorrhea, unspecified 01/17/2023   Tachycardia 01/11/2023   Nausea 01/08/2023   Neck pain 01/08/2023   Acne vulgaris 01/08/2023   Severe anxiety with panic 08/22/2022   High risk heterosexual behavior 08/22/2022   Acute vaginitis 08/22/2022   Positive ANA (antinuclear antibody) 02/07/2022   Myalgia 11/24/2021   Arthralgia 11/14/2021   Iron deficiency anemia 08/17/2021   GERD (gastroesophageal reflux disease)    Morbid obesity (HCC) 06/05/2021   History of scoliosis 03/06/2021   Bilateral thoracic back pain 03/06/2021   Bone lesion 10/27/2020   Daytime somnolence 07/11/2020   Other fatigue 07/11/2020   Metabolic syndrome 07/11/2020   History of pre-eclampsia  07/11/2020   Obesity (BMI 30-39.9) 07/11/2020    Insomnia 07/08/2020   Intractable migraine without aura and without status migrainosus 07/08/2020   Chest pain 07/08/2020   Generalized anxiety disorder 06/24/2020   Carpal tunnel syndrome, bilateral 05/07/2018   Cesarean delivery delivered 04/28/2018   Right hand paresthesia 06/19/2017   Past Medical History:  Diagnosis Date   Generalized anxiety disorder 06/24/2020   GERD (gastroesophageal reflux disease)    Past Surgical History:  Procedure Laterality Date   CESAREAN SECTION     MOUTH SURGERY  05/2021   UPPER GI ENDOSCOPY  04/27/2021   Social History   Tobacco Use   Smoking status: Never   Smokeless tobacco: Never  Vaping Use   Vaping status: Never Used  Substance Use Topics   Alcohol use: Never   Drug use: Never   Social History   Socioeconomic History   Marital status: Single    Spouse name: Not on file   Number of children: 1   Years of education: Not on file   Highest education level: Some college, no degree  Occupational History   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[employer]  Tobacco Use   Smoking status: Never   Smokeless tobacco: Never  Vaping Use   Vaping status: Never Used  Substance and Sexual Activity   Alcohol use: Never   Drug use: Never   Sexual activity: Not Currently    Birth control/protection: None  Other Topics Concern   Not on file  Social History Narrative   Right handed   Social Determinants of Health   Financial Resource Strain:  Medium Risk (01/07/2023)   Overall Financial Resource Strain (CARDIA)    Difficulty of Paying Living Expenses: Somewhat hard  Food Insecurity: No Food Insecurity (01/11/2023)   Hunger Vital Sign    Worried About Running Out of Food in the Last Year: Never true    Ran Out of Food in the Last Year: Never true  Recent Concern: Food Insecurity - Food Insecurity Present (01/07/2023)   Hunger Vital Sign    Worried About Running Out of Food in the Last Year: Often true    Ran Out of Food in the  Last Year: Often true  Transportation Needs: No Transportation Needs (01/11/2023)   PRAPARE - Administrator, Civil Service (Medical): No    Lack of Transportation (Non-Medical): No  Physical Activity: Insufficiently Active (01/07/2023)   Exercise Vital Sign    Days of Exercise per Week: 5 days    Minutes of Exercise per Session: 10 min  Stress: Stress Concern Present (01/07/2023)   [employer] of Occupational Health - Occupational Stress Questionnaire    Feeling of Stress : To some extent  Social Connections: Moderately Isolated (01/07/2023)   Social Connection and Isolation Panel [NHANES]    Frequency of Communication with Friends and Family: More than three times a week    Frequency of Social Gatherings with Friends and Family: More than three times a week    Attends Religious Services: 1 to 4 times per year    Active Member of [employer] or Organizations: No    Attends Engineer, structural: Not on file    Marital Status: Never married  Intimate Partner Violence: Unknown (01/11/2023)   Humiliation, Afraid, Rape, and Kick questionnaire    Fear of Current or Ex-Partner: Not on file    Emotionally Abused: No    Physically Abused: No    Sexually Abused: No   Family Status  Relation Name Status   Mother  Alive   Father  Alive   Neg Hx  (Not Specified)  No partnership data on file   Family History  Problem Relation Age of Onset   Hyperlipidemia Mother    Hypertension Mother    Hyperlipidemia Father    Hypertension Father    Diabetes Father    Colon cancer Neg Hx    Stomach cancer Neg Hx    Esophageal cancer Neg Hx    Pancreatic cancer Neg Hx    Colon polyps Neg Hx    Rectal cancer Neg Hx    No Known Allergies    Review of Systems  Constitutional:  Negative for chills, fever and malaise/fatigue.  HENT:  Negative for congestion and hearing loss.   Eyes:  Negative for blurred vision and discharge.  Respiratory:  Negative for cough, sputum production  and shortness of breath.   Cardiovascular:  Negative for chest pain, palpitations and leg swelling.  Gastrointestinal:  Negative for abdominal pain, blood in stool, constipation, diarrhea, heartburn, nausea and vomiting.  Genitourinary:  Negative for dysuria, frequency, hematuria and urgency.  Musculoskeletal:  Negative for back pain, falls and myalgias.  Skin:  Negative for rash.  Neurological:  Negative for dizziness, sensory change, loss of consciousness, weakness and headaches.  Endo/Heme/Allergies:  Negative for environmental allergies. Does not bruise/bleed easily.  Psychiatric/Behavioral:  Negative for depression and suicidal ideas. The patient is not nervous/anxious and does not have insomnia.       Objective:     BP 108/80 (BP Location: Left Arm, Patient Position: Sitting, Cuff Size:  Large)   Pulse 87   Temp 98.5 F (36.9 C) (Oral)   Resp 18   Ht 5\' 6"  (1.676 m)   Wt 235 lb 9.6 oz (106.9 kg)   LMP 12/20/2022 (Exact Date) Comment: negative urine pregnancy test 01/11/23  SpO2 97%   BMI 38.03 kg/m  BP Readings from Last 3 Encounters:  01/17/23 108/80  01/14/23 118/78  01/12/23 134/87   Wt Readings from Last 3 Encounters:  01/17/23 235 lb 9.6 oz (106.9 kg)  01/14/23 236 lb (107 kg)  01/11/23 240 lb (108.9 kg)   SpO2 Readings from Last 3 Encounters:  01/17/23 97%  01/14/23 98%  01/12/23 100%      Physical Exam Vitals and nursing note reviewed.  Constitutional:      General: She is not in acute distress.    Appearance: Normal appearance. She is well-developed.  HENT:     Head: Normocephalic and atraumatic.     Right Ear: Tympanic membrane, ear canal and external ear normal. There is no impacted cerumen.     Left Ear: Tympanic membrane, ear canal and external ear normal. There is no impacted cerumen.     Nose: Nose normal.     Mouth/Throat:     Mouth: Mucous membranes are moist.     Pharynx: Oropharynx is clear. No oropharyngeal exudate or posterior  oropharyngeal erythema.  Eyes:     General: No scleral icterus.       Right eye: No discharge.        Left eye: No discharge.     Conjunctiva/sclera: Conjunctivae normal.     Pupils: Pupils are equal, round, and reactive to light.  Neck:     Thyroid: No thyromegaly or thyroid tenderness.     Vascular: No JVD.  Cardiovascular:     Rate and Rhythm: Normal rate and regular rhythm.     Heart sounds: Normal heart sounds. No murmur heard. Pulmonary:     Effort: Pulmonary effort is normal. No respiratory distress.     Breath sounds: Normal breath sounds.  Abdominal:     General: Bowel sounds are normal. There is no distension.     Palpations: Abdomen is soft. There is no mass.     Tenderness: There is no abdominal tenderness. There is no guarding or rebound.  Genitourinary:    Vagina: Normal.  Musculoskeletal:        General: Normal range of motion.     Cervical back: Normal range of motion and neck supple.     Right lower leg: No edema.     Left lower leg: No edema.  Lymphadenopathy:     Cervical: No cervical adenopathy.  Skin:    General: Skin is warm and dry.     Findings: No erythema or rash.  Neurological:     Mental Status: She is alert and oriented to person, place, and time.     Cranial Nerves: No cranial nerve deficit.     Deep Tendon Reflexes: Reflexes are normal and symmetric.  Psychiatric:        Mood and Affect: Mood normal.        Behavior: Behavior normal.        Thought Content: Thought content normal.        Judgment: Judgment normal.      No results found for any visits on 01/17/23.  Last CBC Lab Results  Component Value Date   WBC 6.3 01/12/2023   HGB 10.6 (L) 01/12/2023   HCT 32.6 (  L) 01/12/2023   MCV 86.7 01/12/2023   MCH 28.2 01/12/2023   RDW 13.9 01/12/2023   PLT 212 01/12/2023   Last metabolic panel Lab Results  Component Value Date   GLUCOSE 93 01/12/2023   NA 137 01/12/2023   K 4.0 01/12/2023   CL 108 01/12/2023   CO2 21 (L)  01/12/2023   BUN <5 (L) 01/12/2023   CREATININE 0.65 01/12/2023   GFRNONAA >60 01/12/2023   CALCIUM 8.7 (L) 01/12/2023   PROT 7.8 01/08/2023   ALBUMIN 4.5 01/08/2023   BILITOT 0.4 01/08/2023   ALKPHOS 87 01/08/2023   AST 31 01/08/2023   ALT 13 01/08/2023   ANIONGAP 8 01/12/2023   Last lipids Lab Results  Component Value Date   CHOL 161 01/08/2023   HDL 40.80 01/08/2023   LDLCALC 104 (H) 01/08/2023   TRIG 83.0 01/08/2023   CHOLHDL 4 01/08/2023   Last hemoglobin A1c Lab Results  Component Value Date   HGBA1C 6.1 07/20/2022   Last thyroid functions Lab Results  Component Value Date   TSH 2.754 01/11/2023   Last vitamin D Lab Results  Component Value Date   VD25OH 9.66 (L) 07/20/2022   Last vitamin B12 and Folate Lab Results  Component Value Date   VITAMINB12 285 11/16/2021      The ASCVD Risk score (Arnett DK, et al., 2019) failed to calculate for the following reasons:   The 2019 ASCVD risk score is only valid for ages 52 to 49    Assessment & Plan:   Problem List Items Addressed This Visit       Unprioritized   Iron deficiency anemia   Relevant Orders   CBC with Differential/Platelet   Comprehensive metabolic panel   Iron, TIBC and Ferritin Panel   Preventative health care - Primary    Ghm utd Check labs  See AVS  Health Maintenance  Topic Date Due   COVID-19 Vaccine (1) Never done   HPV VACCINES (1 - Risk 3-dose series) Never done   PAP-Cervical Cytology Screening  Never done   INFLUENZA VACCINE  12/27/2022   PAP SMEAR-Modifier  11/02/2023   DTaP/Tdap/Td (8 - Td or Tdap) 03/03/2028   Hepatitis C Screening  Completed   HIV Screening  Completed         Dysmenorrhea, unspecified    Ib 800 mg is the only thing that helps her       Relevant Medications   ibuprofen (ADVIL) 800 MG tablet  Assessment and Plan    Anemia Hemoglobin dropped from 12.7 to 10.6 during recent hospitalization, possibly due to fluid resuscitation. Currently  menstruating, which may further lower hemoglobin. -Repeat CBC to reassess hemoglobin level.  Dehydration Patient reports drinking a lot of water but urinating frequently, raising concern about hydration status. However, no clinical signs of dehydration noted during visit. -Advise patient to continue adequate fluid intake.  Work Clearance Patient requires a clearance letter to return to work following recent hospitalization. -Provide clearance letter for return to work.  Menstrual Pain Patient reports severe menstrual pain. -Prescribe Ibuprofen 800mg  as needed for pain.  General Health Maintenance -Repeat BMP as requested by hospital. -Check stool for occult blood. -Follow up on results of stool test ordered by GI specialist.        No follow-ups on file.    Donato Schultz, DO

## 2023-01-17 NOTE — Assessment & Plan Note (Signed)
Ib 800 mg is the only thing that helps her

## 2023-01-18 ENCOUNTER — Ambulatory Visit (HOSPITAL_COMMUNITY): Payer: Commercial Managed Care - PPO

## 2023-01-18 ENCOUNTER — Telehealth: Payer: Self-pay

## 2023-01-18 LAB — CBC WITH DIFFERENTIAL/PLATELET
Basophils Absolute: 0.1 K/uL (ref 0.0–0.1)
Basophils Relative: 1.3 % (ref 0.0–3.0)
Eosinophils Absolute: 0.1 K/uL (ref 0.0–0.7)
Eosinophils Relative: 2 % (ref 0.0–5.0)
HCT: 34 % — ABNORMAL LOW (ref 36.0–46.0)
Hemoglobin: 11.3 g/dL — ABNORMAL LOW (ref 12.0–15.0)
Lymphocytes Relative: 47.8 % — ABNORMAL HIGH (ref 12.0–46.0)
Lymphs Abs: 2.5 K/uL (ref 0.7–4.0)
MCHC: 33.1 g/dL (ref 30.0–36.0)
MCV: 85.8 fl (ref 78.0–100.0)
Monocytes Absolute: 0.5 K/uL (ref 0.1–1.0)
Monocytes Relative: 8.6 % (ref 3.0–12.0)
Neutro Abs: 2.1 K/uL (ref 1.4–7.7)
Neutrophils Relative %: 40.3 % — ABNORMAL LOW (ref 43.0–77.0)
Platelets: 292 K/uL (ref 150.0–400.0)
RBC: 3.96 Mil/uL (ref 3.87–5.11)
RDW: 14.8 % (ref 11.5–15.5)
WBC: 5.3 K/uL (ref 4.0–10.5)

## 2023-01-18 LAB — IRON,TIBC AND FERRITIN PANEL
%SAT: 25 % (ref 16–45)
Ferritin: 25 ng/mL (ref 16–154)
Iron: 84 ug/dL (ref 40–190)
TIBC: 341 ug/dL (ref 250–450)

## 2023-01-18 LAB — COMPREHENSIVE METABOLIC PANEL WITH GFR
ALT: 14 U/L (ref 0–35)
AST: 16 U/L (ref 0–37)
Albumin: 4.1 g/dL (ref 3.5–5.2)
Alkaline Phosphatase: 81 U/L (ref 39–117)
BUN: 6 mg/dL (ref 6–23)
CO2: 25 meq/L (ref 19–32)
Calcium: 9.2 mg/dL (ref 8.4–10.5)
Chloride: 106 meq/L (ref 96–112)
Creatinine, Ser: 0.78 mg/dL (ref 0.40–1.20)
GFR: 104.56 mL/min (ref >60.00)
Glucose, Bld: 99 mg/dL (ref 70–99)
Potassium: 3.7 meq/L (ref 3.5–5.1)
Sodium: 139 meq/L (ref 135–145)
Total Bilirubin: 0.3 mg/dL (ref 0.2–1.2)
Total Protein: 7.2 g/dL (ref 6.0–8.3)

## 2023-01-18 NOTE — Telephone Encounter (Signed)
PT is returning call. Please advise.  

## 2023-01-18 NOTE — Telephone Encounter (Signed)
Contacted pt & pt verified the collection date for stool test.

## 2023-01-18 NOTE — Telephone Encounter (Signed)
Contacted pt & LVM to return call 

## 2023-01-21 ENCOUNTER — Telehealth: Payer: Self-pay

## 2023-01-21 NOTE — Telephone Encounter (Signed)
Contacted pt & pt is aware of negative h pylori [employer] test.

## 2023-01-29 ENCOUNTER — Ambulatory Visit: Payer: Commercial Managed Care - PPO | Admitting: Physician Assistant

## 2023-01-29 NOTE — Progress Notes (Signed)
Emry Morcom was seen and treated in the [facility] emergency department on 01/11/2023.  Dagoberto Ligas, RN  [phone]

## 2023-01-30 ENCOUNTER — Encounter: Payer: Self-pay | Admitting: Nurse Practitioner

## 2023-02-11 ENCOUNTER — Ambulatory Visit: Payer: Commercial Managed Care - PPO | Attending: Physician Assistant | Admitting: Nurse Practitioner

## 2023-02-11 NOTE — Progress Notes (Deleted)
Office Visit    Patient Name: JAMELIA GOERTZEN Date of Encounter: 02/11/2023  Primary Care Provider:  Zola Button, Grayling Congress, DO Primary Cardiologist:  Thomasene Ripple, DO  Chief Complaint    27 year old female with a history of preeclampsia, sinus tachycardia, GERD, anemia, anxiety, and obesity who presents for follow-up related to elevated heart rate and shortness of breath.  Past Medical History    Past Medical History:  Diagnosis Date   Generalized anxiety disorder 06/24/2020   GERD (gastroesophageal reflux disease)    Past Surgical History:  Procedure Laterality Date   CESAREAN SECTION     MOUTH SURGERY  05/2021   UPPER GI ENDOSCOPY  04/27/2021    Allergies  No Known Allergies   Labs/Other Studies Reviewed    The following studies were reviewed today:  Cardiac Studies & Procedures       ECHOCARDIOGRAM  ECHOCARDIOGRAM COMPLETE 01/12/2023  Narrative ECHOCARDIOGRAM REPORT    Patient Name:   Mardella Layman Date of Exam: 01/12/2023 Medical Rec #:  [id number]      Height:       66.0 in Accession #:    [id number]     Weight:       240.0 lb Date of Birth:  [birth year]     BSA:          2.161 m Patient Age:    26 years       BP:           135/85 mmHg Patient Gender: F              HR:           78 bpm. Exam Location:  Inpatient  Procedure: 2D Echo, Cardiac Doppler, Color Doppler and Intracardiac Opacification Agent  Indications:    Dyspnea R06.00  History:        Patient has prior history of Echocardiogram examinations, most recent 04/27/2021.  Sonographer:    Harriette Bouillon RDCS Referring Phys: [id number] Dorothe Pea BRANCH  IMPRESSIONS   1. Left ventricular ejection fraction, by estimation, is 60 to 65%. The left ventricle has normal function. The left ventricle has no regional wall motion abnormalities. Left ventricular diastolic parameters were normal. 2. Right ventricular systolic function is normal. The right ventricular size is normal. 3. The mitral valve  is normal in structure. No evidence of mitral valve regurgitation. No evidence of mitral stenosis. 4. The aortic valve is tricuspid. Aortic valve regurgitation is not visualized. No aortic stenosis is present. 5. The inferior vena cava is normal in size with greater than 50% respiratory variability, suggesting right atrial pressure of 3 mmHg.  FINDINGS Left Ventricle: Left ventricular ejection fraction, by estimation, is 60 to 65%. The left ventricle has normal function. The left ventricle has no regional wall motion abnormalities. Definity contrast agent was given IV to delineate the left ventricular endocardial borders. The left ventricular internal cavity size was normal in size. There is no left ventricular hypertrophy. Left ventricular diastolic parameters were normal.  Right Ventricle: The right ventricular size is normal. Right vetricular wall thickness was not well visualized. Right ventricular systolic function is normal.  Left Atrium: Left atrial size was normal in size.  Right Atrium: Right atrial size was normal in size.  Pericardium: Trivial pericardial effusion is present. The pericardial effusion is anterior to the right ventricle.  Mitral Valve: The mitral valve is normal in structure. No evidence of mitral valve regurgitation. No evidence of mitral valve stenosis.  Tricuspid Valve:  The tricuspid valve is normal in structure. Tricuspid valve regurgitation is not demonstrated. No evidence of tricuspid stenosis.  Aortic Valve: The aortic valve is tricuspid. Aortic valve regurgitation is not visualized. No aortic stenosis is present.  Pulmonic Valve: The pulmonic valve was not well visualized. Pulmonic valve regurgitation is not visualized. No evidence of pulmonic stenosis.  Aorta: The aortic root and ascending aorta are structurally normal, with no evidence of dilitation.  Venous: The inferior vena cava is normal in size with greater than 50% respiratory variability,  suggesting right atrial pressure of 3 mmHg.  IAS/Shunts: No atrial level shunt detected by color flow Doppler.   LEFT VENTRICLE PLAX 2D LVIDd:         4.80 cm   Diastology LVIDs:         3.20 cm   LV e' medial:    11.10 cm/s LV PW:         0.70 cm   LV E/e' medial:  8.4 LV IVS:        0.70 cm   LV e' lateral:   17.60 cm/s LVOT diam:     1.90 cm   LV E/e' lateral: 5.3 LV SV:         64 LV SV Index:   30 LVOT Area:     2.84 cm   RIGHT VENTRICLE             IVC RV S prime:     14.80 cm/s  IVC diam: 1.30 cm TAPSE (M-mode): 1.9 cm  LEFT ATRIUM             Index        RIGHT ATRIUM           Index LA diam:        3.80 cm 1.76 cm/m   RA Area:     13.90 cm LA Vol (A2C):   37.4 ml 17.31 ml/m  RA Volume:   34.90 ml  16.15 ml/m LA Vol (A4C):   37.4 ml 17.31 ml/m LA Biplane Vol: 37.5 ml 17.35 ml/m AORTIC VALVE LVOT Vmax:   114.00 cm/s LVOT Vmean:  76.000 cm/s LVOT VTI:    0.225 m  AORTA Ao Root diam: 2.60 cm Ao Asc diam:  2.60 cm  MITRAL VALVE MV Area (PHT): 3.12 cm    SHUNTS MV Decel Time: 243 msec    Systemic VTI:  0.22 m MV E velocity: 93.00 cm/s  Systemic Diam: 1.90 cm MV A velocity: 85.10 cm/s MV E/A ratio:  1.09  Dina Rich MD Electronically signed by Dina Rich MD Signature Date/Time: 01/12/2023/2:24:47 PM    Final            Recent Labs: 01/11/2023: Magnesium 2.1; TSH 2.754 01/17/2023: ALT 14; BUN 6; Creatinine, Ser 0.78; Hemoglobin 11.3; Platelets 292.0; Potassium 3.7; Sodium 139  Recent Lipid Panel    Component Value Date/Time   CHOL 161 01/08/2023 1811   TRIG 83.0 01/08/2023 1811   HDL 40.80 01/08/2023 1811   CHOLHDL 4 01/08/2023 1811   VLDL 16.6 01/08/2023 1811   LDLCALC 104 (H) 01/08/2023 1811    History of Present Illness    27 year old female with the above past medical history including preeclampsia, sinus tachycardia, GERD, anemia, anxiety, and obesity.   Prior echocardiogram in 2022 showed EF 50 to 55%, low normal LV function,  no RWMA, no significant valvular abnormalities.  She was last seen in the office on 03/22/2021.  She was seen virtually on 08/18/2021  for preoperative cardiac evaluation.She was cleared for gastric bypass surgery. She presented to the ED in August 2024 in the setting of elevated heart rate, shortness of breath.  Cardiology was consulted.  Echocardiogram was normal.  CTA was negative for PE, chest x-ray was unremarkable, BNP was normal.  No further cardiac workup was advised.  She presents today for follow-up. Since her recent ED visit she has  Sinus tachycardia: Shortness of breath: Obesity: Disposition:    Home Medications    Current Outpatient Medications  Medication Sig Dispense Refill   ibuprofen (ADVIL) 800 MG tablet Take 1 tablet (800 mg total) by mouth every 8 (eight) hours as needed. 30 tablet 3   Iron, Ferrous Sulfate, 325 (65 Fe) MG TABS Take 325 mg by mouth daily. 30 tablet 5   Multiple Vitamin (MULTIVITAMIN) capsule Take 1 capsule by mouth daily.     ondansetron (ZOFRAN) 4 MG tablet Take 1 tablet (4 mg total) by mouth every 8 (eight) hours as needed for nausea or vomiting. 20 tablet 0   Vitamin D, Ergocalciferol, (DRISDOL) 1.25 MG (50000 UNIT) CAPS capsule Take 1 capsule (50,000 Units total) by mouth every 7 (seven) days. 12 capsule 0   No current facility-administered medications for this visit.     Review of Systems    ***.  All other systems reviewed and are otherwise negative except as noted above.    Physical Exam    VS:  LMP 12/20/2022 (Exact Date) Comment: negative urine pregnancy test 01/11/23 , BMI There is no height or weight on file to calculate BMI.     GEN: Well nourished, well developed, in no acute distress. HEENT: normal. Neck: Supple, no JVD, carotid bruits, or masses. Cardiac: RRR, no murmurs, rubs, or gallops. No clubbing, cyanosis, edema.  Radials/DP/PT 2+ and equal bilaterally.  Respiratory:  Respirations regular and unlabored, clear to auscultation  bilaterally. GI: Soft, nontender, nondistended, BS + x 4. MS: no deformity or atrophy. Skin: warm and dry, no rash. Neuro:  Strength and sensation are intact. Psych: Normal affect.  Accessory Clinical Findings    ECG personally reviewed by me today -    - no acute changes.   Lab Results  Component Value Date   WBC 5.3 01/17/2023   HGB 11.3 (L) 01/17/2023   HCT 34.0 (L) 01/17/2023   MCV 85.8 01/17/2023   PLT 292.0 01/17/2023   Lab Results  Component Value Date   CREATININE 0.78 01/17/2023   BUN 6 01/17/2023   NA 139 01/17/2023   K 3.7 01/17/2023   CL 106 01/17/2023   CO2 25 01/17/2023   Lab Results  Component Value Date   ALT 14 01/17/2023   AST 16 01/17/2023   ALKPHOS 81 01/17/2023   BILITOT 0.3 01/17/2023   Lab Results  Component Value Date   CHOL 161 01/08/2023   HDL 40.80 01/08/2023   LDLCALC 104 (H) 01/08/2023   TRIG 83.0 01/08/2023   CHOLHDL 4 01/08/2023    Lab Results  Component Value Date   HGBA1C 6.1 07/20/2022    Assessment & Plan    1.  ***  No BP recorded.  {Refresh Note OR Click here to enter BP  :1}***   Joylene Grapes, NP 02/11/2023, 4:51 AM

## 2023-04-03 ENCOUNTER — Encounter: Payer: Self-pay | Admitting: Family Medicine

## 2023-04-03 ENCOUNTER — Telehealth: Payer: Self-pay | Admitting: Pulmonary Disease

## 2023-04-03 NOTE — Telephone Encounter (Signed)
Not feeling well. Wants someone to check her lungs, We can sched a virtual appt w/another APP or MD Friday if that is Ok. [phone]

## 2023-04-03 NOTE — Telephone Encounter (Signed)
Spoke with patient regarding prior message . Offered patient a office visit on 11/14 with Beth and patient stated she wanted a office visit on 11/27. Advised patient she be put on the cancel list . Patient's voice was understanding.Nothing else further needed.

## 2023-04-04 ENCOUNTER — Ambulatory Visit (HOSPITAL_BASED_OUTPATIENT_CLINIC_OR_DEPARTMENT_OTHER): Payer: Commercial Managed Care - PPO | Admitting: Student

## 2023-04-04 ENCOUNTER — Ambulatory Visit: Payer: Commercial Managed Care - PPO | Admitting: Cardiology

## 2023-04-04 ENCOUNTER — Encounter: Payer: Self-pay | Admitting: Family Medicine

## 2023-04-04 ENCOUNTER — Ambulatory Visit (INDEPENDENT_AMBULATORY_CARE_PROVIDER_SITE_OTHER): Payer: Commercial Managed Care - PPO | Admitting: Family Medicine

## 2023-04-04 VITALS — BP 110/60 | HR 78 | Temp 98.6°F | Resp 18 | Ht 66.0 in | Wt 234.8 lb

## 2023-04-04 DIAGNOSIS — R102 Pelvic and perineal pain: Secondary | ICD-10-CM | POA: Diagnosis not present

## 2023-04-04 DIAGNOSIS — D509 Iron deficiency anemia, unspecified: Secondary | ICD-10-CM

## 2023-04-04 DIAGNOSIS — E049 Nontoxic goiter, unspecified: Secondary | ICD-10-CM | POA: Diagnosis not present

## 2023-04-04 LAB — POC URINALSYSI DIPSTICK (AUTOMATED)
Blood, UA: NEGATIVE
Glucose, UA: NEGATIVE
Leukocytes, UA: NEGATIVE
Nitrite, UA: NEGATIVE
Protein, UA: NEGATIVE
Spec Grav, UA: 1.025 (ref 1.010–1.025)
Urobilinogen, UA: 0.2 U/dL
pH, UA: 6 (ref 5.0–8.0)

## 2023-04-04 NOTE — Progress Notes (Signed)
Established Patient Office Visit  Subjective   Patient ID: Jodi Norman, female    DOB: 1995/11/19  Age: 27 y.o. MRN: [id number]  Chief Complaint  Patient presents with   Hospitalization Follow-up    HPI Discussed the use of AI scribe software for clinical note transcription with the patient, who gave verbal consent to proceed.  History of Present Illness   The patient, with a history of recent ED visit, presents for a follow-up visit, although unsure of the reason for the appointment. She reports persistent body aches, particularly in the hips, and requests blood work to check iron, vitamin D, and calcium levels. The hip pain is described as a constant soreness, not exacerbated by walking, but noticeable when sitting. She also reports a single episode of severe pelvic pain, described as a sharp, stabbing sensation, lasting an entire day.  The patient also mentions concerns about frequent exhaustion, questioning if it could be related to vitamin D or B12 deficiency. She expresses curiosity about the possibility of kidney stones, as she had experienced severe pain, but denies dysuria. She admits to not completing a previous course of medication for a UTI.  Additionally, the patient reports congestion and mucus production, but denies feeling ill otherwise. She requests a lymph node check due to these symptoms. She also expresses concern about a perceived enlargement of her neck, suspecting thyroid swelling. She is aware that the thyroid affects many bodily functions and expresses concern about potential complications. She agrees to an ultrasound to examine the thyroid gland.       Patient Active Problem List   Diagnosis Date Noted   Preventative health care 01/17/2023   Dysmenorrhea, unspecified 01/17/2023   Tachycardia 01/11/2023   Nausea 01/08/2023   Neck pain 01/08/2023   Acne vulgaris 01/08/2023   Severe anxiety with panic 08/22/2022   High risk heterosexual behavior 08/22/2022    Acute vaginitis 08/22/2022   Positive ANA (antinuclear antibody) 02/07/2022   Myalgia 11/24/2021   Arthralgia 11/14/2021   Iron deficiency anemia 08/17/2021   GERD (gastroesophageal reflux disease)    Morbid obesity (HCC) 06/05/2021   History of scoliosis 03/06/2021   Bilateral thoracic back pain 03/06/2021   Bone lesion 10/27/2020   Daytime somnolence 07/11/2020   Other fatigue 07/11/2020   Metabolic syndrome 07/11/2020   History of pre-eclampsia  07/11/2020   Obesity (BMI 30-39.9) 07/11/2020   Insomnia 07/08/2020   Intractable migraine without aura and without status migrainosus 07/08/2020   Chest pain 07/08/2020   Generalized anxiety disorder 06/24/2020   Carpal tunnel syndrome, bilateral 05/07/2018   Cesarean delivery delivered 04/28/2018   Right hand paresthesia 06/19/2017   Past Medical History:  Diagnosis Date   Generalized anxiety disorder 06/24/2020   GERD (gastroesophageal reflux disease)    Past Surgical History:  Procedure Laterality Date   CESAREAN SECTION     MOUTH SURGERY  05/2021   UPPER GI ENDOSCOPY  04/27/2021   Social History   Tobacco Use   Smoking status: Never   Smokeless tobacco: Never  Vaping Use   Vaping status: Never Used  Substance Use Topics   Alcohol use: Never   Drug use: Never   Social History   Socioeconomic History   Marital status: Single    Spouse name: Not on file   Number of children: 1   Years of education: Not on file   Highest education level: Some college, no degree  Occupational History   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[place]  [employer]  Tobacco Use   Smoking status: Never   Smokeless tobacco: Never  Vaping Use   Vaping status: Never Used  Substance and Sexual Activity   Alcohol use: Never   Drug use: Never   Sexual activity: Not Currently    Birth control/protection: None  Other Topics Concern   Not on file  Social History Narrative   Right handed   Social Determinants of Health   Financial  Resource Strain: Medium Risk (01/07/2023)   Overall Financial Resource Strain (CARDIA)    Difficulty of Paying Living Expenses: Somewhat hard  Food Insecurity: No Food Insecurity (01/11/2023)   Hunger Vital Sign    Worried About Running Out of Food in the Last Year: Never true    Ran Out of Food in the Last Year: Never true  Recent Concern: Food Insecurity - Food Insecurity Present (01/07/2023)   Hunger Vital Sign    Worried About Running Out of Food in the Last Year: Often true    Ran Out of Food in the Last Year: Often true  Transportation Needs: No Transportation Needs (01/11/2023)   PRAPARE - Administrator, Civil Service (Medical): No    Lack of Transportation (Non-Medical): No  Physical Activity: Insufficiently Active (01/07/2023)   Exercise Vital Sign    Days of Exercise per Week: 5 days    Minutes of Exercise per Session: 10 min  Stress: Stress Concern Present (01/07/2023)   [employer] of Occupational Health - Occupational Stress Questionnaire    Feeling of Stress : To some extent  Social Connections: Moderately Isolated (01/07/2023)   Social Connection and Isolation Panel [NHANES]    Frequency of Communication with Friends and Family: More than three times a week    Frequency of Social Gatherings with Friends and Family: More than three times a week    Attends Religious Services: 1 to 4 times per year    Active Member of [employer] or Organizations: No    Attends Engineer, structural: Not on file    Marital Status: Never married  Intimate Partner Violence: Unknown (01/11/2023)   Humiliation, Afraid, Rape, and Kick questionnaire    Fear of Current or Ex-Partner: Not on file    Emotionally Abused: No    Physically Abused: No    Sexually Abused: No   Family Status  Relation Name Status   Mother  Alive   Father  Alive   Neg Hx  (Not Specified)  No partnership data on file   Family History  Problem Relation Age of Onset   Hyperlipidemia Mother     Hypertension Mother    Hyperlipidemia Father    Hypertension Father    Diabetes Father    Colon cancer Neg Hx    Stomach cancer Neg Hx    Esophageal cancer Neg Hx    Pancreatic cancer Neg Hx    Colon polyps Neg Hx    Rectal cancer Neg Hx    No Known Allergies    Review of Systems  Constitutional:  Negative for chills, fever and malaise/fatigue.  HENT:  Negative for congestion and hearing loss.   Eyes:  Negative for blurred vision and discharge.  Respiratory:  Negative for cough, sputum production and shortness of breath.   Cardiovascular:  Negative for chest pain, palpitations and leg swelling.  Gastrointestinal:  Negative for abdominal pain, blood in stool, constipation, diarrhea, heartburn, nausea and vomiting.  Genitourinary:  Negative for dysuria, frequency, hematuria and urgency.  Musculoskeletal:  Negative for back pain, falls and myalgias.  Skin:  Negative for rash.  Neurological:  Negative for dizziness, sensory change, loss of consciousness, weakness and headaches.  Endo/Heme/Allergies:  Negative for environmental allergies. Does not bruise/bleed easily.  Psychiatric/Behavioral:  Negative for depression and suicidal ideas. The patient is not nervous/anxious and does not have insomnia.       Objective:     BP 110/60 (BP Location: Left Arm, Patient Position: Sitting, Cuff Size: Large)   Pulse 78   Temp 98.6 F (37 C) (Oral)   Resp 18   Ht 5\' 6"  (1.676 m)   Wt 234 lb 12.8 oz (106.5 kg)   SpO2 97%   BMI 37.90 kg/m  BP Readings from Last 3 Encounters:  04/04/23 110/60  01/17/23 108/80  01/14/23 118/78   Wt Readings from Last 3 Encounters:  04/04/23 234 lb 12.8 oz (106.5 kg)  01/17/23 235 lb 9.6 oz (106.9 kg)  01/14/23 236 lb (107 kg)   SpO2 Readings from Last 3 Encounters:  04/04/23 97%  01/17/23 97%  01/14/23 98%      Physical Exam Vitals and nursing note reviewed.  Constitutional:      General: She is not in acute distress.    Appearance: Normal  appearance. She is well-developed.  HENT:     Head: Normocephalic and atraumatic.     Right Ear: Tympanic membrane, ear canal and external ear normal. There is no impacted cerumen.     Left Ear: Tympanic membrane, ear canal and external ear normal. There is no impacted cerumen.     Nose: Nose normal.     Mouth/Throat:     Mouth: Mucous membranes are moist.     Pharynx: Oropharynx is clear. No oropharyngeal exudate or posterior oropharyngeal erythema.  Eyes:     General: No scleral icterus.       Right eye: No discharge.        Left eye: No discharge.     Conjunctiva/sclera: Conjunctivae normal.     Pupils: Pupils are equal, round, and reactive to light.  Neck:     Thyroid: Thyromegaly present. No thyroid tenderness.     Vascular: No JVD.  Cardiovascular:     Rate and Rhythm: Normal rate and regular rhythm.     Heart sounds: Normal heart sounds. No murmur heard. Pulmonary:     Effort: Pulmonary effort is normal. No respiratory distress.     Breath sounds: Normal breath sounds.  Abdominal:     General: Bowel sounds are normal. There is no distension.     Palpations: Abdomen is soft. There is no mass.     Tenderness: There is no abdominal tenderness. There is no guarding or rebound.  Genitourinary:    Vagina: Normal.  Musculoskeletal:        General: Normal range of motion.     Cervical back: Normal range of motion and neck supple.     Right lower leg: No edema.     Left lower leg: No edema.  Lymphadenopathy:     Cervical: No cervical adenopathy.  Skin:    General: Skin is warm and dry.     Findings: No erythema or rash.  Neurological:     Mental Status: She is alert and oriented to person, place, and time.     Cranial Nerves: No cranial nerve deficit.     Deep Tendon Reflexes: Reflexes are normal and symmetric.  Psychiatric:        Mood and Affect: Mood  normal.        Behavior: Behavior normal.        Thought Content: Thought content normal.        Judgment: Judgment  normal.      No results found for any visits on 04/04/23.  Last CBC Lab Results  Component Value Date   WBC 5.3 01/17/2023   HGB 11.3 (L) 01/17/2023   HCT 34.0 (L) 01/17/2023   MCV 85.8 01/17/2023   MCH 28.2 01/12/2023   RDW 14.8 01/17/2023   PLT 292.0 01/17/2023   Last metabolic panel Lab Results  Component Value Date   GLUCOSE 99 01/17/2023   NA 139 01/17/2023   K 3.7 01/17/2023   CL 106 01/17/2023   CO2 25 01/17/2023   BUN 6 01/17/2023   CREATININE 0.78 01/17/2023   GFR 104.56 01/17/2023   CALCIUM 9.2 01/17/2023   PROT 7.2 01/17/2023   ALBUMIN 4.1 01/17/2023   BILITOT 0.3 01/17/2023   ALKPHOS 81 01/17/2023   AST 16 01/17/2023   ALT 14 01/17/2023   ANIONGAP 8 01/12/2023   Last lipids Lab Results  Component Value Date   CHOL 161 01/08/2023   HDL 40.80 01/08/2023   LDLCALC 104 (H) 01/08/2023   TRIG 83.0 01/08/2023   CHOLHDL 4 01/08/2023   Last hemoglobin A1c Lab Results  Component Value Date   HGBA1C 6.1 07/20/2022   Last thyroid functions Lab Results  Component Value Date   TSH 2.754 01/11/2023   Last vitamin D Lab Results  Component Value Date   VD25OH 9.66 (L) 07/20/2022   Last vitamin B12 and Folate Lab Results  Component Value Date   VITAMINB12 285 11/16/2021      The ASCVD Risk score (Arnett DK, et al., 2019) failed to calculate for the following reasons:   The 2019 ASCVD risk score is only valid for ages 30 to 71    Assessment & Plan:   Problem List Items Addressed This Visit       Unprioritized   Iron deficiency anemia - Primary   Relevant Orders   CBC with Differential/Platelet   Iron, TIBC and Ferritin Panel   Vitamin B12   VITAMIN D 25 Hydroxy (Vit-D Deficiency, Fractures)   Other Visit Diagnoses     Suprapubic pain       Relevant Orders   Comprehensive metabolic panel   POCT Urinalysis Dipstick (Automated)   Enlarged thyroid       Relevant Orders   Thyroid Panel With TSH   US THYROID     Assessment and  Plan    Hip Pain   Intermittent hip pain is described as soreness, not exacerbated by walking but present when sitting, with suspected physical exertion as a contributing factor. We discussed potential causes and the importance of completing prescribed medications. We will order blood tests for iron, B12, vitamin D, and calcium, and perform urinalysis to check for hematuria.  Pelvic Pain   She reported sharp, stabbing pelvic pain lasting an entire day. The differential diagnosis includes bladder issues or nephrolithiasis, with no dysuria reported. We discussed the possibility of kidney stones and the need to check for hematuria, hence we will perform urinalysis.  Thyroid Enlargement   She reports feeling neck enlargement, and physical examination suggests possible thyroid enlargement. We discussed the importance of thyroid function and the need for further evaluation. We will order a thyroid ultrasound and blood tests for thyroid function.  General Health Maintenance   During a routine follow-up visit, we  discussed the importance of iron, vitamin D, and calcium in overall health and her potential impact on symptoms like exhaustion and pain. We will order blood tests for iron, B12, vitamin D, and calcium.  Follow-up   We will schedule a thyroid ultrasound, complete blood work and urinalysis, and follow up with results.        No follow-ups on file.    Donato Schultz, DO

## 2023-04-05 LAB — COMPREHENSIVE METABOLIC PANEL
ALT: 9 U/L (ref 0–35)
AST: 12 U/L (ref 0–37)
Albumin: 4.1 g/dL (ref 3.5–5.2)
Alkaline Phosphatase: 88 U/L (ref 39–117)
BUN: 6 mg/dL (ref 6–23)
CO2: 28 meq/L (ref 19–32)
Calcium: 9.1 mg/dL (ref 8.4–10.5)
Chloride: 106 meq/L (ref 96–112)
Creatinine, Ser: 0.73 mg/dL (ref 0.40–1.20)
GFR: 113.05 mL/min (ref >60.00)
Glucose, Bld: 82 mg/dL (ref 70–99)
Potassium: 4.2 meq/L (ref 3.5–5.1)
Sodium: 141 meq/L (ref 135–145)
Total Bilirubin: 0.3 mg/dL (ref 0.2–1.2)
Total Protein: 7 g/dL (ref 6.0–8.3)

## 2023-04-05 LAB — CBC WITH DIFFERENTIAL/PLATELET
Basophils Absolute: 0.1 10*3/uL (ref 0.0–0.1)
Basophils Relative: 0.8 % (ref 0.0–3.0)
Eosinophils Absolute: 0.1 10*3/uL (ref 0.0–0.7)
Eosinophils Relative: 2.1 % (ref 0.0–5.0)
HCT: 32.2 % — ABNORMAL LOW (ref 36.0–46.0)
Hemoglobin: 10.6 g/dL — ABNORMAL LOW (ref 12.0–15.0)
Lymphocytes Relative: 39.8 % (ref 12.0–46.0)
Lymphs Abs: 2.5 10*3/uL (ref 0.7–4.0)
MCHC: 33.1 g/dL (ref 30.0–36.0)
MCV: 86.4 fL (ref 78.0–100.0)
Monocytes Absolute: 0.5 10*3/uL (ref 0.1–1.0)
Monocytes Relative: 8.2 % (ref 3.0–12.0)
Neutro Abs: 3 10*3/uL (ref 1.4–7.7)
Neutrophils Relative %: 49.1 % (ref 43.0–77.0)
Platelets: 277 10*3/uL (ref 150.0–400.0)
RBC: 3.72 Mil/uL — ABNORMAL LOW (ref 3.87–5.11)
RDW: 14.5 % (ref 11.5–15.5)
WBC: 6.2 10*3/uL (ref 4.0–10.5)

## 2023-04-05 LAB — THYROID PANEL WITH TSH
Free Thyroxine Index: 2.2 (ref 1.4–3.8)
T3 Uptake: 29 % (ref 22–35)
T4, Total: 7.5 ug/dL (ref 5.1–11.9)
TSH: 0.45 m[IU]/L

## 2023-04-05 LAB — IRON,TIBC AND FERRITIN PANEL
%SAT: 11 % — ABNORMAL LOW (ref 16–45)
Ferritin: 11 ng/mL — ABNORMAL LOW (ref 16–154)
Iron: 37 ug/dL — ABNORMAL LOW (ref 40–190)
TIBC: 349 ug/dL (ref 250–450)

## 2023-04-05 LAB — VITAMIN D 25 HYDROXY (VIT D DEFICIENCY, FRACTURES): VITD: 19.65 ng/mL — ABNORMAL LOW (ref 30.00–100.00)

## 2023-04-05 LAB — VITAMIN B12: Vitamin B-12: 212 pg/mL (ref 211–911)

## 2023-04-07 ENCOUNTER — Ambulatory Visit (HOSPITAL_BASED_OUTPATIENT_CLINIC_OR_DEPARTMENT_OTHER): Payer: No Typology Code available for payment source | Attending: Family Medicine

## 2023-04-10 ENCOUNTER — Telehealth (HOSPITAL_BASED_OUTPATIENT_CLINIC_OR_DEPARTMENT_OTHER): Payer: Self-pay

## 2023-04-12 ENCOUNTER — Encounter: Payer: Self-pay | Admitting: Family Medicine

## 2023-04-12 ENCOUNTER — Ambulatory Visit (INDEPENDENT_AMBULATORY_CARE_PROVIDER_SITE_OTHER): Payer: No Typology Code available for payment source | Admitting: Family Medicine

## 2023-04-12 VITALS — BP 110/80 | HR 95 | Temp 98.3°F | Resp 18 | Ht 66.0 in | Wt 230.4 lb

## 2023-04-12 DIAGNOSIS — F418 Other specified anxiety disorders: Secondary | ICD-10-CM | POA: Diagnosis not present

## 2023-04-12 NOTE — Progress Notes (Signed)
Established Patient Office Visit  Subjective   Patient ID: Jodi Norman, female    DOB: 24-Jul-1995  Age: 27 y.o. MRN: [id number]  Chief Complaint  Patient presents with   Panic Attack    HPI Discussed the use of AI scribe software for clinical note transcription with the patient, who gave verbal consent to proceed.  History of Present Illness   The patient presents with a long-standing feeling of unease, which she is unable to pinpoint. She reports experiencing a panic attack at work, characterized by racing thoughts and difficulty breathing, despite no identifiable stressor. The patient denies suicidal ideation but expresses constant worry and feelings of being overwhelmed. She reports a decrease in appetite, resulting in significant weight loss, which she views as a positive outcome. Despite this, she acknowledges that her eating habits are not healthy.  The patient also reports feeling like a failure in her role as a parent, despite reassurances from others. She has difficulty concentrating, which has led to a request for time off work. The patient expresses fear about starting medication for her mental health, worrying about potential side effects and the possibility of becoming dependent on it.  The patient has tried to manage her symptoms through exercise but reports that this has not been helpful. She expresses a desire for therapy but is unable to afford the copays. The patient has a history of sarcoidosis and expresses concern about a potential connection between this condition and her mental health symptoms. She also reports a family history of thyroid problems and is awaiting further evaluation for a potentially enlarged thyroid.  The patient's symptoms appear to be a combination of anxiety and depression, with a significant impact on her daily life and functioning. She expresses a strong desire for help and is actively seeking resources to improve her mental health.       Patient Active Problem List   Diagnosis Date Noted   Preventative health care 01/17/2023   Dysmenorrhea, unspecified 01/17/2023   Tachycardia 01/11/2023   Nausea 01/08/2023   Neck pain 01/08/2023   Acne vulgaris 01/08/2023   Severe anxiety with panic 08/22/2022   High risk heterosexual behavior 08/22/2022   Acute vaginitis 08/22/2022   Positive ANA (antinuclear antibody) 02/07/2022   Myalgia 11/24/2021   Arthralgia 11/14/2021   Iron deficiency anemia 08/17/2021   GERD (gastroesophageal reflux disease)    Morbid obesity (HCC) 06/05/2021   History of scoliosis 03/06/2021   Bilateral thoracic back pain 03/06/2021   Bone lesion 10/27/2020   Daytime somnolence 07/11/2020   Other fatigue 07/11/2020   Metabolic syndrome 07/11/2020   History of pre-eclampsia  07/11/2020   Obesity (BMI 30-39.9) 07/11/2020   Insomnia 07/08/2020   Intractable migraine without aura and without status migrainosus 07/08/2020   Chest pain 07/08/2020   Generalized anxiety disorder 06/24/2020   Carpal tunnel syndrome, bilateral 05/07/2018   Cesarean delivery delivered 04/28/2018   Right hand paresthesia 06/19/2017   Past Medical History:  Diagnosis Date   Generalized anxiety disorder 06/24/2020   GERD (gastroesophageal reflux disease)    Past Surgical History:  Procedure Laterality Date   CESAREAN SECTION     MOUTH SURGERY  05/2021   UPPER GI ENDOSCOPY  04/27/2021   Social History   Tobacco Use   Smoking status: Never   Smokeless tobacco: Never  Vaping Use   Vaping status: Never Used  Substance Use Topics   Alcohol use: Never   Drug use: Never   Social History   Socioeconomic  History   Marital status: Single    Spouse name: Not on file   Number of children: 1   Years of education: Not on file   Highest education level: Some college, no degree  Occupational History   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[employer]  Tobacco Use   Smoking status: Never   Smokeless tobacco:  Never  Vaping Use   Vaping status: Never Used  Substance and Sexual Activity   Alcohol use: Never   Drug use: Never   Sexual activity: Not Currently    Birth control/protection: None  Other Topics Concern   Not on file  Social History Narrative   Right handed   Social Determinants of Health   Financial Resource Strain: Medium Risk (01/07/2023)   Overall Financial Resource Strain (CARDIA)    Difficulty of Paying Living Expenses: Somewhat hard  Food Insecurity: No Food Insecurity (01/11/2023)   Hunger Vital Sign    Worried About Running Out of Food in the Last Year: Never true    Ran Out of Food in the Last Year: Never true  Recent Concern: Food Insecurity - Food Insecurity Present (01/07/2023)   Hunger Vital Sign    Worried About Running Out of Food in the Last Year: Often true    Ran Out of Food in the Last Year: Often true  Transportation Needs: No Transportation Needs (01/11/2023)   PRAPARE - Administrator, Civil Service (Medical): No    Lack of Transportation (Non-Medical): No  Physical Activity: Insufficiently Active (01/07/2023)   Exercise Vital Sign    Days of Exercise per Week: 5 days    Minutes of Exercise per Session: 10 min  Stress: Stress Concern Present (01/07/2023)   [employer] of Occupational Health - Occupational Stress Questionnaire    Feeling of Stress : To some extent  Social Connections: Moderately Isolated (01/07/2023)   Social Connection and Isolation Panel [NHANES]    Frequency of Communication with Friends and Family: More than three times a week    Frequency of Social Gatherings with Friends and Family: More than three times a week    Attends Religious Services: 1 to 4 times per year    Active Member of [employer] or Organizations: No    Attends Engineer, structural: Not on file    Marital Status: Never married  Intimate Partner Violence: Unknown (01/11/2023)   Humiliation, Afraid, Rape, and Kick questionnaire    Fear of Current or  Ex-Partner: Not on file    Emotionally Abused: No    Physically Abused: No    Sexually Abused: No   Family Status  Relation Name Status   Mother  Alive   Father  Alive   Neg Hx  (Not Specified)  No partnership data on file   Family History  Problem Relation Age of Onset   Hyperlipidemia Mother    Hypertension Mother    Hyperlipidemia Father    Hypertension Father    Diabetes Father    Colon cancer Neg Hx    Stomach cancer Neg Hx    Esophageal cancer Neg Hx    Pancreatic cancer Neg Hx    Colon polyps Neg Hx    Rectal cancer Neg Hx    No Known Allergies    Review of Systems  Constitutional:  Negative for fever and malaise/fatigue.  HENT:  Negative for congestion.   Eyes:  Negative for blurred vision.  Respiratory:  Negative for cough and shortness of  breath.   Cardiovascular:  Negative for chest pain, palpitations and leg swelling.  Gastrointestinal:  Negative for vomiting.  Musculoskeletal:  Negative for back pain.  Skin:  Negative for rash.  Neurological:  Negative for loss of consciousness and headaches.  Psychiatric/Behavioral:  Positive for depression. The patient is nervous/anxious.       Objective:     BP 110/80 (BP Location: Left Arm, Patient Position: Sitting, Cuff Size: Large)   Pulse 95   Temp 98.3 F (36.8 C) (Oral)   Resp 18   Ht 5\' 6"  (1.676 m)   Wt 230 lb 6.4 oz (104.5 kg)   SpO2 99%   BMI 37.19 kg/m  BP Readings from Last 3 Encounters:  04/12/23 110/80  04/04/23 110/60  01/17/23 108/80   Wt Readings from Last 3 Encounters:  04/12/23 230 lb 6.4 oz (104.5 kg)  04/04/23 234 lb 12.8 oz (106.5 kg)  01/17/23 235 lb 9.6 oz (106.9 kg)   SpO2 Readings from Last 3 Encounters:  04/12/23 99%  04/04/23 97%  01/17/23 97%      Physical Exam Vitals and nursing note reviewed.  Constitutional:      General: She is not in acute distress.    Appearance: Normal appearance. She is well-developed.  HENT:     Head: Normocephalic and atraumatic.   Eyes:     General: No scleral icterus.       Right eye: No discharge.        Left eye: No discharge.  Cardiovascular:     Rate and Rhythm: Normal rate and regular rhythm.     Heart sounds: No murmur heard. Pulmonary:     Effort: Pulmonary effort is normal. No respiratory distress.     Breath sounds: Normal breath sounds.  Musculoskeletal:        General: Normal range of motion.     Cervical back: Normal range of motion and neck supple.     Right lower leg: No edema.     Left lower leg: No edema.  Skin:    General: Skin is warm and dry.  Neurological:     Mental Status: She is alert and oriented to person, place, and time.  Psychiatric:        Mood and Affect: Mood is anxious and depressed.        Speech: Speech normal.        Behavior: Behavior normal.        Thought Content: Thought content normal. Thought content does not include homicidal or suicidal ideation. Thought content does not include homicidal or suicidal plan.        Cognition and Memory: Cognition and memory normal.        Judgment: Judgment normal.      No results found for any visits on 04/12/23.  Last CBC Lab Results  Component Value Date   WBC 6.2 04/04/2023   HGB 10.6 (L) 04/04/2023   HCT 32.2 (L) 04/04/2023   MCV 86.4 04/04/2023   MCH 28.2 01/12/2023   RDW 14.5 04/04/2023   PLT 277.0 04/04/2023   Last metabolic panel Lab Results  Component Value Date   GLUCOSE 82 04/04/2023   NA 141 04/04/2023   K 4.2 04/04/2023   CL 106 04/04/2023   CO2 28 04/04/2023   BUN 6 04/04/2023   CREATININE 0.73 04/04/2023   GFR 113.05 04/04/2023   CALCIUM 9.1 04/04/2023   PROT 7.0 04/04/2023   ALBUMIN 4.1 04/04/2023   BILITOT 0.3 04/04/2023  ALKPHOS 88 04/04/2023   AST 12 04/04/2023   ALT 9 04/04/2023   ANIONGAP 8 01/12/2023   Last lipids Lab Results  Component Value Date   CHOL 161 01/08/2023   HDL 40.80 01/08/2023   LDLCALC 104 (H) 01/08/2023   TRIG 83.0 01/08/2023   CHOLHDL 4 01/08/2023   Last  hemoglobin A1c Lab Results  Component Value Date   HGBA1C 6.1 07/20/2022   Last thyroid functions Lab Results  Component Value Date   TSH 0.45 04/04/2023   T4TOTAL 7.5 04/04/2023   Last vitamin D Lab Results  Component Value Date   VD25OH 19.65 (L) 04/04/2023   Last vitamin B12 and Folate Lab Results  Component Value Date   VITAMINB12 212 04/04/2023      The ASCVD Risk score (Arnett DK, et al., 2019) failed to calculate for the following reasons:   The 2019 ASCVD risk score is only valid for ages 44 to 43    Assessment & Plan:   Problem List Items Addressed This Visit   None Visit Diagnoses     Depression with anxiety    -  Primary   Relevant Orders   Ambulatory referral to Psychiatry     Assessment and Plan    Generalized Anxiety Disorder   She exhibits persistent anxiety, including a recent panic attack at work, racing thoughts, difficulty concentrating, and constant worry. She prefers non-pharmacological interventions due to concerns about medication dependency and side effects, such as suicidal thoughts. We will refer her to [facility] for urgent evaluation and provide contact information for walk-in behavioral health services at [address] [place]. We encourage the use of lorazepam as needed for severe panic attacks and discuss the importance of exercise and other stress-relief activities.  Major Depressive Disorder   She reports low energy, low mood, and daily crying for months, with symptoms including significant weight loss, feelings of failure in motherhood, and difficulty concentrating, but no suicidal ideation. She prefers therapy and spiritual counseling over medication due to concerns about cost and side effects, including suicidal thoughts. We will refer her to [facility] for urgent evaluation, provide contact information for walk-in behavioral health services at [address], [place],  encourage seeking spiritual counseling as additional support, and discuss potential benefits and risks of antidepressant medication.  Thyroid Nodule   She has an enlarged thyroid found on physical exam and missed a thyroid scan appointment. Previous blood work was normal, but further imaging is necessary to rule out underlying issues. We will order a thyroid ultrasound, advise scheduling the ultrasound as soon as possible, and discuss the potential impact of thyroid issues on mood and overall health.  Follow-up   We will ensure follow-up with behavioral health services for ongoing mental health support and with primary care for thyroid ultrasound results and further management. We will also complete FMLA paperwork for mental health leave from work.        No follow-ups on file.    Donato Schultz, DO

## 2023-04-12 NOTE — Addendum Note (Signed)
Addended by: Seabron Spates R on: 04/12/2023 02:59 PM   Modules accepted: Orders

## 2023-04-14 ENCOUNTER — Other Ambulatory Visit (HOSPITAL_BASED_OUTPATIENT_CLINIC_OR_DEPARTMENT_OTHER): Payer: No Typology Code available for payment source

## 2023-04-15 ENCOUNTER — Encounter (HOSPITAL_COMMUNITY): Payer: Self-pay | Admitting: Emergency Medicine

## 2023-04-15 ENCOUNTER — Ambulatory Visit (HOSPITAL_BASED_OUTPATIENT_CLINIC_OR_DEPARTMENT_OTHER): Admission: RE | Admit: 2023-04-15 | Payer: No Typology Code available for payment source | Source: Ambulatory Visit

## 2023-04-15 ENCOUNTER — Telehealth: Payer: Self-pay | Admitting: Family Medicine

## 2023-04-15 ENCOUNTER — Ambulatory Visit (HOSPITAL_COMMUNITY)
Admission: EM | Admit: 2023-04-15 | Discharge: 2023-04-15 | Discharge status: Against Medical Advice | Payer: No Typology Code available for payment source

## 2023-04-15 ENCOUNTER — Other Ambulatory Visit: Payer: Self-pay

## 2023-04-15 ENCOUNTER — Emergency Department (HOSPITAL_COMMUNITY)
Admission: EM | Admit: 2023-04-15 | Discharge: 2023-04-15 | Disposition: A | Discharge status: Home / Self Care | Payer: Commercial Managed Care - PPO | Attending: Emergency Medicine | Admitting: Emergency Medicine

## 2023-04-15 DIAGNOSIS — F22 Delusional disorders: Secondary | ICD-10-CM | POA: Diagnosis not present

## 2023-04-15 DIAGNOSIS — F419 Anxiety disorder, unspecified: Secondary | ICD-10-CM | POA: Insufficient documentation

## 2023-04-15 DIAGNOSIS — R519 Headache, unspecified: Secondary | ICD-10-CM | POA: Diagnosis not present

## 2023-04-15 LAB — RAPID URINE DRUG SCREEN, HOSP PERFORMED
Amphetamines: NOT DETECTED
Barbiturates: NOT DETECTED
Benzodiazepines: NOT DETECTED
Cocaine: NOT DETECTED
Opiates: NOT DETECTED
Tetrahydrocannabinol: POSITIVE — AB

## 2023-04-15 LAB — COMPREHENSIVE METABOLIC PANEL
ALT: 23 U/L (ref 0–44)
AST: 48 U/L — ABNORMAL HIGH (ref 15–41)
Albumin: 4.4 g/dL (ref 3.5–5.0)
Alkaline Phosphatase: 79 U/L (ref 38–126)
Anion gap: 10 (ref 5–15)
BUN: 8 mg/dL (ref 6–20)
CO2: 25 mmol/L (ref 22–32)
Calcium: 9.6 mg/dL (ref 8.9–10.3)
Chloride: 103 mmol/L (ref 98–111)
Creatinine, Ser: 0.65 mg/dL (ref 0.44–1.00)
GFR, Estimated: >60 mL/min (ref >60)
Glucose, Bld: 93 mg/dL (ref 70–99)
Potassium: 3.7 mmol/L (ref 3.5–5.1)
Sodium: 138 mmol/L (ref 135–145)
Total Bilirubin: 0.6 mg/dL (ref <1.2)
Total Protein: 8.2 g/dL — ABNORMAL HIGH (ref 6.5–8.1)

## 2023-04-15 LAB — CBC
HCT: 35.5 % — ABNORMAL LOW (ref 36.0–46.0)
Hemoglobin: 11.8 g/dL — ABNORMAL LOW (ref 12.0–15.0)
MCH: 28.4 pg (ref 26.0–34.0)
MCHC: 33.2 g/dL (ref 30.0–36.0)
MCV: 85.3 fL (ref 80.0–100.0)
Platelets: 297 10*3/uL (ref 150–400)
RBC: 4.16 MIL/uL (ref 3.87–5.11)
RDW: 13.6 % (ref 11.5–15.5)
WBC: 5.3 10*3/uL (ref 4.0–10.5)
nRBC: 0 % (ref 0.0–0.2)

## 2023-04-15 LAB — SALICYLATE LEVEL: Salicylate Lvl: <7 mg/dL — ABNORMAL LOW (ref 7.0–30.0)

## 2023-04-15 LAB — ACETAMINOPHEN LEVEL: Acetaminophen (Tylenol), Serum: <10 ug/mL — ABNORMAL LOW (ref 10–30)

## 2023-04-15 LAB — HCG, SERUM, QUALITATIVE: Preg, Serum: NEGATIVE

## 2023-04-15 LAB — ETHANOL: Alcohol, Ethyl (B): <10 mg/dL (ref <10)

## 2023-04-15 MED ORDER — ACETAMINOPHEN 500 MG PO TABS
1000.0000 mg | ORAL_TABLET | Freq: Once | ORAL | Status: AC
  Administered 2023-04-15: 1000 mg via ORAL
  Filled 2023-04-15: qty 2

## 2023-04-15 MED ORDER — KETOROLAC TROMETHAMINE 15 MG/ML IJ SOLN
15.0000 mg | Freq: Once | INTRAMUSCULAR | Status: AC
  Administered 2023-04-15: 15 mg via INTRAMUSCULAR
  Filled 2023-04-15: qty 1

## 2023-04-15 NOTE — Telephone Encounter (Signed)
Pt called to offer a virtual with a different provider and to provide behavorial health number. VM was full

## 2023-04-15 NOTE — Discharge Instructions (Addendum)
It was a pleasure caring for you today.  Workup today was reassuring.  Please reach out to your primary care provider tomorrow to start on daily anxiety medication.  Seek emergency care if experiencing any new or worsening symptoms

## 2023-04-15 NOTE — ED Triage Notes (Signed)
Patient arrives ambulatory by POV with mother c/o feeling like she is having a mental breakdown. Patient states over the past week she has been having a lot of anxiety and having "a million different thoughts." Patient denies any stressors or life changes to make her feel this way. Patient states she is not SI/HI. Denies any hallucinations. States she would really like to speak with a psychiatrist. Reports lack of appetite and not eating or drinking much over the past week.

## 2023-04-15 NOTE — Telephone Encounter (Signed)
Patient currently at ER for evaluation.

## 2023-04-15 NOTE — Telephone Encounter (Signed)
Pt came in office stating is needing to talk to Dr Laury Axon, pt mentioned that Dr Laury Axon informed her that she can come in office when ever she wanted, pt was informed Dr Laury Axon was not in office, if there was anything that we can help her with (if anything going on with her health), Pt stated that it had to do with mental issue. Pt was offered if ok to schedule today with any provider - pt said yes it was ok. Pt was offered appt today with Dr Drue Novel at 4, pt stated had another appt today and got mad and left the office. Please advise pt.

## 2023-04-15 NOTE — ED Provider Notes (Signed)
[facility]ENCY DEPARTMENT AT [facility] Provider Note   CSN: [id number] Arrival date & time: 04/15/23  1606     History  Chief Complaint  Patient presents with   Anxiety   Paranoid    Jodi Norman is a 27 y.o. female with PMHx anxiety, GERD who presents to ED concerned for severe anxiety x1 week. Patient denies any large life changes or increased stressors. Denies SI/HI. Denies auditory/visual hallucinations. States that she has a headache but denies head trauma, LOC, seizures, blood thinners.   Denies fever, chest pain, dyspnea, cough, nausea, vomiting, abdominal pain. diarrhea.   Anxiety Associated symptoms include headaches.       Home Medications Prior to Admission medications   Medication Sig Start Date End Date Taking? Authorizing Provider  ibuprofen (ADVIL) 800 MG tablet Take 1 tablet (800 mg total) by mouth every 8 (eight) hours as needed. 01/17/23   Donato Schultz, DO  Iron, Ferrous Sulfate, 325 (65 Fe) MG TABS Take 325 mg by mouth daily. 01/08/23   Donato Schultz, DO  Multiple Vitamin (MULTIVITAMIN) capsule Take 1 capsule by mouth daily.    [provider]  ondansetron (ZOFRAN) 4 MG tablet Take 1 tablet (4 mg total) by mouth every 8 (eight) hours as needed for nausea or vomiting. 01/08/23   Zola Button, Grayling Congress, DO  Vitamin D, Ergocalciferol, (DRISDOL) 1.25 MG (50000 UNIT) CAPS capsule Take 1 capsule (50,000 Units total) by mouth every 7 (seven) days. 07/20/22   Sharlene Dory, DO      Allergies    Patient has no known allergies.    Review of Systems   Review of Systems  Neurological:  Positive for headaches.    Physical Exam Updated Vital Signs BP 135/89 (BP Location: Left Arm)   Pulse 79   Temp 98.7 F (37.1 C) (Oral)   Resp 18   Ht 5\' 6"  (1.676 m)   Wt 104 kg   LMP 04/12/2023   SpO2 100%   BMI 37.01 kg/m  Physical Exam Vitals and nursing note reviewed.  Constitutional:      Appearance: She is  not ill-appearing or toxic-appearing.     Comments: Patient tearful during initial exam  HENT:     Head: Normocephalic and atraumatic.     Mouth/Throat:     Mouth: Mucous membranes are moist.  Eyes:     General: No scleral icterus.       Right eye: No discharge.        Left eye: No discharge.     Conjunctiva/sclera: Conjunctivae normal.  Cardiovascular:     Rate and Rhythm: Normal rate and regular rhythm.     Pulses: Normal pulses.     Heart sounds: Normal heart sounds. No murmur heard. Pulmonary:     Effort: Pulmonary effort is normal. No respiratory distress.     Breath sounds: Normal breath sounds. No wheezing, rhonchi or rales.  Abdominal:     General: Abdomen is flat. Bowel sounds are normal. There is no distension.     Palpations: Abdomen is soft. There is no mass.     Tenderness: There is no abdominal tenderness.  Musculoskeletal:     Right lower leg: No edema.     Left lower leg: No edema.  Skin:    General: Skin is warm and dry.     Findings: No rash.  Neurological:     General: No focal deficit present.     Mental  Status: She is alert. Mental status is at baseline.     Comments: GCS 15. Speech is goal oriented. No deficits appreciated to CN III-XII; symmetric eyebrow raise, no facial drooping, tongue midline. Patient has equal grip strength bilaterally with 5/5 strength against resistance in all major muscle groups bilaterally. Sensation to light touch intact. Patient moves extremities without ataxia. Patient ambulatory with steady gait.   Psychiatric:        Mood and Affect: Mood normal.        Behavior: Behavior normal.     ED Results / Procedures / Treatments   Labs (all labs ordered are listed, but only abnormal results are displayed) Labs Reviewed  COMPREHENSIVE METABOLIC PANEL - Abnormal; Notable for the following components:      Result Value   Total Protein 8.2 (*)    AST 48 (*)    All other components within normal limits  SALICYLATE LEVEL -  Abnormal; Notable for the following components:   Salicylate Lvl <7.0 (*)    All other components within normal limits  ACETAMINOPHEN LEVEL - Abnormal; Notable for the following components:   Acetaminophen (Tylenol), Serum <10 (*)    All other components within normal limits  CBC - Abnormal; Notable for the following components:   Hemoglobin 11.8 (*)    HCT 35.5 (*)    All other components within normal limits  RAPID URINE DRUG SCREEN, HOSP PERFORMED - Abnormal; Notable for the following components:   Tetrahydrocannabinol POSITIVE (*)    All other components within normal limits  ETHANOL  HCG, SERUM, QUALITATIVE    EKG None  Radiology No results found.  Procedures Procedures    Medications Ordered in ED Medications  acetaminophen (TYLENOL) tablet 1,000 mg (1,000 mg Oral Given 04/15/23 1819)  ketorolac (TORADOL) 15 MG/ML injection 15 mg (15 mg Intramuscular Given 04/15/23 1821)    ED Course/ Medical Decision Making/ A&P                                 Medical Decision Making Amount and/or Complexity of Data Reviewed Labs: ordered.   This patient presents to the ED for psych evaluation, this involves an extensive number of treatment options, and is a complaint that carries with it a high risk of complications and morbidity.  The differential diagnosis includes primary psychosis, substance-induced psychosis, mood disturbance, SI/HI.   Co morbidities that complicate the patient evaluation  none   Additional history obtained:  Dr. Laury Axon PCP - recently seen in office for anxiety but declined daily anxiety medicaitons   Lab Tests:  I Ordered, and personally interpreted labs.  The pertinent results include:   -UDS: positive for cannabis -ETOH: negative - acetaminophen/salicylate levels: within normal limits -CMP: no concern for electrolyte abnormality; no concern for kidney/liver damage - hcg: negative   Problem List / ED Course / Critical interventions /  Medication management  Patient presented for anxiety. Denies SI/HI/hallucinations. Denies recent life stressors. Patient with headache but denying any infectious symptoms today. On my initial exam, the pt was linear in thought, appropriate in affect, and overall well-appearing.  Physical and neuro exam unremarkable. Provided patient with Toradol and tylenol for headache which provided relief.  CBC with mild anemia with hemoglobin 11.8.  No leukocytosis.  EtOH, acetaminophen, salicylate levels all within normal limits.  CMP reassuring.  UDS positive for cannabis.  hCG negative. Shared results with patient and educated patient about [facility]. Patient  stating that she was already seen an [facility] today and "turned away" for not being SI/HI or having hallucinations. Shared decision making with patient who agrees to follow up with PCP to start daily anxiety medications. Patient requested that I also reach out to her PCP which I did.   I have reviewed the patients home medicines and have made adjustments as needed Patient afebrile with stable vitals.  Provided with return precautions.  Discharged in good condition.   Social Determinants of Health:  none         Final Clinical Impression(s) / ED Diagnoses Final diagnoses:  Anxiety    Rx / DC Orders ED Discharge Orders     None         Dorthy Cooler, [place] 04/15/23 1849    Virgina Norfolk, DO 04/15/23 1910

## 2023-04-16 ENCOUNTER — Telehealth (INDEPENDENT_AMBULATORY_CARE_PROVIDER_SITE_OTHER): Payer: No Typology Code available for payment source | Admitting: Family

## 2023-04-16 ENCOUNTER — Encounter: Payer: Self-pay | Admitting: Family

## 2023-04-16 DIAGNOSIS — D869 Sarcoidosis, unspecified: Secondary | ICD-10-CM | POA: Insufficient documentation

## 2023-04-16 DIAGNOSIS — F410 Panic disorder [episodic paroxysmal anxiety] without agoraphobia: Secondary | ICD-10-CM | POA: Diagnosis not present

## 2023-04-16 DIAGNOSIS — R7401 Elevation of levels of liver transaminase levels: Secondary | ICD-10-CM | POA: Diagnosis not present

## 2023-04-16 DIAGNOSIS — E860 Dehydration: Secondary | ICD-10-CM | POA: Insufficient documentation

## 2023-04-16 MED ORDER — SERTRALINE HCL 50 MG PO TABS: ORAL_TABLET | ORAL | 0 refills | Status: DC

## 2023-04-16 NOTE — Assessment & Plan Note (Signed)
New.  Recommend repeat next visit.

## 2023-04-16 NOTE — Assessment & Plan Note (Signed)
She also seems to have some underlying depression symptoms.  Denies SI/HI.  I think she would really benefit from an SSRI and counseling. We discussed mechanism of action of SSRI as well as common side effects.   She wishes to try this.  She does have some lorazepam to use as needed.  I gave her the number for [facility] to call to get established with a counselor.   I have written her out of work until 12/5 and she will schedule  follow up visit on 12/4 with myself or PCP for re-evaluation.

## 2023-04-16 NOTE — Progress Notes (Signed)
MyChart Video Visit    Virtual Visit via Video Note    Patient location: Home. Patient and provider in visit Provider location: Office  I discussed the limitations of evaluation and management by telemedicine and the availability of in person appointments. The patient expressed understanding and agreed to proceed.  Visit Date: 04/16/2023  Today's healthcare provider: Lemont Fillers, NP     Subjective:    Patient ID: Jodi Norman, female    DOB: 09-Mar-1996, 27 y.o.   MRN: [id number]  Chief Complaint  Patient presents with   mental health issues    HPI  Patient presents today with concerns about mood.  She reports that she feels empty, worthless, overwhelmed.  Has a 40 year old daughter.  Works in [employer] at [employer]. Returned in person work in September which she thinks was a good thing.  Started to feel badly around the time of her birthday on [birth year].  Felt lonely on her birthday. Had her period at that time.  Not eating, barely drinking. Urine is very dark.  Some gatorade.  No appetite whatsoever.  She uses lorazepam prn. Has been given escitalopram in the past but was afraid of the side effects so she didn't take it. Denies SI/HI.  She did go to the ER yesterday,  AST and protein were both mildly elevated.  Anemia was stable.  She does have a history of sarcoidosis which is another source of worry for her.   Worked Thursday.  Plan to return 04/02/23. She plans to bring [place] FMLA paperwork.  Past Medical History:  Diagnosis Date   Generalized anxiety disorder 06/24/2020   GERD (gastroesophageal reflux disease)    Sarcoidosis     Past Surgical History:  Procedure Laterality Date   CESAREAN SECTION     MOUTH SURGERY  05/2021   UPPER GI ENDOSCOPY  04/27/2021    Family History  Problem Relation Age of Onset   Hyperlipidemia Mother    Hypertension Mother    Hyperlipidemia Father    Hypertension Father    Diabetes Father    Colon cancer Neg Hx    Stomach cancer  Neg Hx    Esophageal cancer Neg Hx    Pancreatic cancer Neg Hx    Colon polyps Neg Hx    Rectal cancer Neg Hx     Social History   Socioeconomic History   Marital status: Single    Spouse name: Not on file   Number of children: 1   Years of education: Not on file   Highest education level: Some college, no degree  Occupational History   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[place] [employer]  Tobacco Use   Smoking status: Never   Smokeless tobacco: Never  Vaping Use   Vaping status: Never Used  Substance and Sexual Activity   Alcohol use: Never   Drug use: Never   Sexual activity: Not Currently    Birth control/protection: None  Other Topics Concern   Not on file  Social History Narrative   Right handed   Single, has one daughter born 2019   Works for the post [employer] in [employer]   Social Determinants of Health   Financial Resource Strain: Medium Risk (01/07/2023)   Overall Financial Resource Strain (CARDIA)    Difficulty of Paying Living Expenses: Somewhat hard  Food Insecurity: No Food Insecurity (01/11/2023)   Hunger Vital Sign    Worried About Running Out of Food in the Last Year:  Never true    Ran Out of Food in the Last Year: Never true  Recent Concern: Food Insecurity - Food Insecurity Present (01/07/2023)   Hunger Vital Sign    Worried About Running Out of Food in the Last Year: Often true    Ran Out of Food in the Last Year: Often true  Transportation Needs: No Transportation Needs (01/11/2023)   PRAPARE - Administrator, Civil Service (Medical): No    Lack of Transportation (Non-Medical): No  Physical Activity: Insufficiently Active (01/07/2023)   Exercise Vital Sign    Days of Exercise per Week: 5 days    Minutes of Exercise per Session: 10 min  Stress: Stress Concern Present (01/07/2023)   [employer] of Occupational Health - Occupational Stress Questionnaire    Feeling of Stress : To some extent  Social Connections: Moderately Isolated  (01/07/2023)   Social Connection and Isolation Panel [NHANES]    Frequency of Communication with Friends and Family: More than three times a week    Frequency of Social Gatherings with Friends and Family: More than three times a week    Attends Religious Services: 1 to 4 times per year    Active Member of [employer] or Organizations: No    Attends Engineer, structural: Not on file    Marital Status: Never married  Intimate Partner Violence: Unknown (01/11/2023)   Humiliation, Afraid, Rape, and Kick questionnaire    Fear of Current or Ex-Partner: Not on file    Emotionally Abused: No    Physically Abused: No    Sexually Abused: No    Outpatient Medications Prior to Visit  Medication Sig Dispense Refill   ibuprofen (ADVIL) 800 MG tablet Take 1 tablet (800 mg total) by mouth every 8 (eight) hours as needed. 30 tablet 3   Iron, Ferrous Sulfate, 325 (65 Fe) MG TABS Take 325 mg by mouth daily. 30 tablet 5   Multiple Vitamin (MULTIVITAMIN) capsule Take 1 capsule by mouth daily.     ondansetron (ZOFRAN) 4 MG tablet Take 1 tablet (4 mg total) by mouth every 8 (eight) hours as needed for nausea or vomiting. 20 tablet 0   Vitamin D, Ergocalciferol, (DRISDOL) 1.25 MG (50000 UNIT) CAPS capsule Take 1 capsule (50,000 Units total) by mouth every 7 (seven) days. 12 capsule 0   No facility-administered medications prior to visit.    No Known Allergies  ROS See HPI    Objective:    Physical Exam  LMP 04/12/2023  Wt Readings from Last 3 Encounters:  04/15/23 229 lb 4.5 oz (104 kg)  04/12/23 230 lb 6.4 oz (104.5 kg)  04/04/23 234 lb 12.8 oz (106.5 kg)   Gen: Awake, alert, no acute distress Resp: Breathing is even and non-labored Psych: anxious, thought content is normal Neuro: Alert and Oriented x 3, + facial symmetry, speech is clear.      Assessment & Plan:   Problem List Items Addressed This Visit       Unprioritized   Severe anxiety with panic - Primary    She also seems to  have some underlying depression symptoms.  Denies SI/HI.  I think she would really benefit from an SSRI and counseling. We discussed mechanism of action of SSRI as well as common side effects.   She wishes to try this.  She does have some lorazepam to use as needed.  I gave her the number for [facility] to call to get established with a counselor.  I have written her out of work until 12/5 and she will schedule  follow up visit on 12/4 with myself or PCP for re-evaluation.       Relevant Medications   sertraline (ZOLOFT) 50 MG tablet   Sarcoidosis    This is being followed by [facility] rheumatology.       Elevated AST (SGOT)    New.  Recommend repeat next visit.          Dehydration    Likely cause for dark urine and elevated serum protein.  Encouraged patient to push her fluids.        30 minutes spent on today's visit. The majority of this time was spent counseling patient on anxiety/depression and treatment.  I am having Wilsie N. Kinderman start on sertraline. I am also having her maintain her multivitamin, Vitamin D (Ergocalciferol), Iron (Ferrous Sulfate), ondansetron, and ibuprofen.  Meds ordered this encounter  Medications   sertraline (ZOLOFT) 50 MG tablet    Sig: 1/2 tablet by mouth once daily for 1 week, then increase to a full tab once daily on week two    Dispense:  30 tablet    Refill:  0    Order Specific Question:   Supervising Provider    Answer:   Danise Edge A [[id number]]    I discussed the assessment and treatment plan with the patient. The patient was provided an opportunity to ask questions and all were answered. The patient agreed with the plan and demonstrated an understanding of the instructions.   The patient was advised to call back or seek an in-person evaluation if the symptoms worsen or if the condition fails to improve as anticipated.   Lemont Fillers, NP [facility] [phone]  (phone) [phone] (fax)  [facility]

## 2023-04-16 NOTE — Assessment & Plan Note (Signed)
Likely cause for dark urine and elevated serum protein.  Encouraged patient to push her fluids.

## 2023-04-16 NOTE — Assessment & Plan Note (Signed)
This is being followed by [facility] rheumatology.

## 2023-04-22 NOTE — Telephone Encounter (Signed)
Pt dropped off documents to be filled out by provider [facility] paperwork) Pt would like to be called when document ready for pick up at [facility] [phone]. Document put at front office tray under providers name.

## 2023-04-23 NOTE — Telephone Encounter (Signed)
Form placed in [place] folder for review and signature.

## 2023-04-25 ENCOUNTER — Encounter: Payer: Self-pay | Admitting: Family Medicine

## 2023-04-25 DIAGNOSIS — Z0279 Encounter for issue of other medical certificate: Secondary | ICD-10-CM

## 2023-04-26 ENCOUNTER — Emergency Department (HOSPITAL_BASED_OUTPATIENT_CLINIC_OR_DEPARTMENT_OTHER): Payer: No Typology Code available for payment source

## 2023-04-26 ENCOUNTER — Other Ambulatory Visit: Payer: Self-pay

## 2023-04-26 ENCOUNTER — Encounter (HOSPITAL_BASED_OUTPATIENT_CLINIC_OR_DEPARTMENT_OTHER): Payer: Self-pay | Admitting: Emergency Medicine

## 2023-04-26 ENCOUNTER — Emergency Department (HOSPITAL_BASED_OUTPATIENT_CLINIC_OR_DEPARTMENT_OTHER)
Admission: EM | Admit: 2023-04-26 | Discharge: 2023-04-26 | Disposition: A | Discharge status: Home / Self Care | Payer: No Typology Code available for payment source | Attending: Emergency Medicine | Admitting: Emergency Medicine

## 2023-04-26 DIAGNOSIS — R0789 Other chest pain: Secondary | ICD-10-CM | POA: Insufficient documentation

## 2023-04-26 DIAGNOSIS — R079 Chest pain, unspecified: Secondary | ICD-10-CM | POA: Diagnosis present

## 2023-04-26 LAB — I-STAT CHEM 8, ED
BUN: 6 mg/dL (ref 6–20)
Calcium, Ion: 1.16 mmol/L (ref 1.15–1.40)
Chloride: 103 mmol/L (ref 98–111)
Creatinine, Ser: 0.7 mg/dL (ref 0.44–1.00)
Glucose, Bld: 102 mg/dL — ABNORMAL HIGH (ref 70–99)
HCT: 37 % (ref 36.0–46.0)
Hemoglobin: 12.6 g/dL (ref 12.0–15.0)
Potassium: 3.4 mmol/L — ABNORMAL LOW (ref 3.5–5.1)
Sodium: 138 mmol/L (ref 135–145)
TCO2: 21 mmol/L — ABNORMAL LOW (ref 22–32)

## 2023-04-26 LAB — CBC
HCT: 34.5 % — ABNORMAL LOW (ref 36.0–46.0)
Hemoglobin: 11.6 g/dL — ABNORMAL LOW (ref 12.0–15.0)
MCH: 27.7 pg (ref 26.0–34.0)
MCHC: 33.6 g/dL (ref 30.0–36.0)
MCV: 82.3 fL (ref 80.0–100.0)
Platelets: 254 10*3/uL (ref 150–400)
RBC: 4.19 MIL/uL (ref 3.87–5.11)
RDW: 13.3 % (ref 11.5–15.5)
WBC: 6.4 10*3/uL (ref 4.0–10.5)
nRBC: 0 % (ref 0.0–0.2)

## 2023-04-26 LAB — TROPONIN I (HIGH SENSITIVITY): Troponin I (High Sensitivity): 3 ng/L (ref <18)

## 2023-04-26 NOTE — ED Provider Notes (Signed)
[place] EMERGENCY DEPARTMENT AT [facility]  Provider Note  CSN: [id number] Arrival date & time: 04/26/23 0154  History Chief Complaint  Patient presents with   Chest Pain    Jodi Norman is a 27 y.o. female with history of anxiety reports she has had moderate mid-chest pressure since around 1700hrs, non radiating, no SOB, nausea or diaphoresis. No particular provoking or relieving factors. She took an anxiety pill (hydroxyzine) without improvement and so decided to come to the ED for evaluation. No recent travel or leg swelling.    Home Medications Prior to Admission medications   Medication Sig Start Date End Date Taking? Authorizing Provider  ibuprofen (ADVIL) 800 MG tablet Take 1 tablet (800 mg total) by mouth every 8 (eight) hours as needed. 01/17/23   Donato Schultz, DO  Iron, Ferrous Sulfate, 325 (65 Fe) MG TABS Take 325 mg by mouth daily. 01/08/23   Donato Schultz, DO  Multiple Vitamin (MULTIVITAMIN) capsule Take 1 capsule by mouth daily.    [provider]  ondansetron (ZOFRAN) 4 MG tablet Take 1 tablet (4 mg total) by mouth every 8 (eight) hours as needed for nausea or vomiting. 01/08/23   Zola Button, Grayling Congress, DO  sertraline (ZOLOFT) 50 MG tablet 1/2 tablet by mouth once daily for 1 week, then increase to a full tab once daily on week two 04/16/23   Sandford Craze, NP  Vitamin D, Ergocalciferol, (DRISDOL) 1.25 MG (50000 UNIT) CAPS capsule Take 1 capsule (50,000 Units total) by mouth every 7 (seven) days. 07/20/22   Sharlene Dory, DO     Allergies    Patient has no known allergies.   Review of Systems   Review of Systems Please see HPI for pertinent positives and negatives  Physical Exam BP 118/76   Pulse 76   Temp 98.7 F (37.1 C) (Oral)   Resp (!) 21   Ht 5\' 6"  (1.676 m)   Wt 99.8 kg   LMP 04/05/2023   SpO2 100%   BMI 35.51 kg/m   Physical Exam Vitals and nursing note reviewed.  Constitutional:       Appearance: Normal appearance.  HENT:     Head: Normocephalic and atraumatic.     Nose: Nose normal.     Mouth/Throat:     Mouth: Mucous membranes are moist.  Eyes:     Extraocular Movements: Extraocular movements intact.     Conjunctiva/sclera: Conjunctivae normal.  Cardiovascular:     Rate and Rhythm: Normal rate.  Pulmonary:     Effort: Pulmonary effort is normal.     Breath sounds: Normal breath sounds.  Chest:     Chest wall: No tenderness.  Abdominal:     General: Abdomen is flat.     Palpations: Abdomen is soft.     Tenderness: There is no abdominal tenderness.  Musculoskeletal:        General: No swelling. Normal range of motion.     Cervical back: Neck supple.  Skin:    General: Skin is warm and dry.  Neurological:     General: No focal deficit present.     Mental Status: She is alert.  Psychiatric:        Mood and Affect: Mood normal.     ED Results / Procedures / Treatments   EKG EKG Interpretation Date/Time:  Friday April 26 2023 02:19:57 EST Ventricular Rate:  79 PR Interval:  115 QRS Duration:  80 QT Interval:  370 QTC Calculation: 425 R Axis:   59  Text Interpretation: Sinus rhythm Borderline short PR interval ST elev, probable normal early repol pattern No significant change since last tracing Confirmed by Susy Frizzle [phone]) on 04/26/2023 2:21:21 AM  Procedures Procedures  Medications Ordered in the ED Medications - No data to display  Initial Impression and Plan  Patient with low risk factor profile, here with atypical chest pain. No concern for PE. Will check labs, CXR and EKG.   ED Course   Clinical Course as of 04/26/23 0345  Fri Apr 26, 2023  0251 I personally viewed the images from radiology studies and agree with radiologist interpretation: CXR is normal. I-stat chem is unremarkable [CS]  0252 CBC is unremarkable.  [CS]  0317 Trop is normal. Given duration of symptoms, atypical nature and low risk factor profile, repeat is  not necessary to rule out AMI.  [CS]  0345 Patient sleeping soundly on re-evaluation. Reassured her ED workup is normal. Recommend PCP follow up, RTED for any other concerns.   [CS]    Clinical Course User Index [CS] Pollyann Savoy, MD     MDM Rules/Calculators/A&P Medical Decision Making Problems Addressed: Atypical chest pain: acute illness or injury  Amount and/or Complexity of Data Reviewed Labs: ordered. Decision-making details documented in ED Course. Radiology: ordered and independent interpretation performed. Decision-making details documented in ED Course. ECG/medicine tests: ordered and independent interpretation performed. Decision-making details documented in ED Course.     Final Clinical Impression(s) / ED Diagnoses Final diagnoses:  Atypical chest pain    Rx / DC Orders ED Discharge Orders     None        Pollyann Savoy, MD 04/26/23 0345

## 2023-04-26 NOTE — ED Triage Notes (Signed)
Pt reports pressure to center of chest since 1700. She took her "anxiety pill" and did not get relief. Pt does report that she has a hx of GERD but states she "did not eat today". She also reports nausea (but no vomiting) but denies any other sx.

## 2023-05-02 ENCOUNTER — Ambulatory Visit (HOSPITAL_BASED_OUTPATIENT_CLINIC_OR_DEPARTMENT_OTHER)
Admission: RE | Admit: 2023-05-02 | Discharge: 2023-05-02 | Disposition: A | Discharge status: Home / Self Care | Payer: No Typology Code available for payment source | Source: Ambulatory Visit | Attending: Family Medicine | Admitting: Family Medicine

## 2023-05-02 ENCOUNTER — Telehealth: Payer: Self-pay | Admitting: Family Medicine

## 2023-05-02 DIAGNOSIS — E049 Nontoxic goiter, unspecified: Secondary | ICD-10-CM | POA: Insufficient documentation

## 2023-05-02 NOTE — Telephone Encounter (Signed)
FYI

## 2023-05-02 NOTE — Telephone Encounter (Signed)
Pt is here and needs to be seen before Monday since they are going back to work on Monday. Please advise if pt can be seen before Monday since they have paperwork they need filled out to return.Pt states they have been calling in and been unable to get appt since they were told PCP had no availability. Anyway pt can be seen Friday.

## 2023-05-03 ENCOUNTER — Ambulatory Visit (INDEPENDENT_AMBULATORY_CARE_PROVIDER_SITE_OTHER): Payer: No Typology Code available for payment source | Admitting: Family Medicine

## 2023-05-03 ENCOUNTER — Encounter: Payer: Self-pay | Admitting: Family Medicine

## 2023-05-03 VITALS — BP 120/80 | HR 76 | Temp 98.6°F | Resp 18 | Ht 66.0 in | Wt 227.6 lb

## 2023-05-03 DIAGNOSIS — J029 Acute pharyngitis, unspecified: Secondary | ICD-10-CM | POA: Diagnosis not present

## 2023-05-03 DIAGNOSIS — F410 Panic disorder [episodic paroxysmal anxiety] without agoraphobia: Secondary | ICD-10-CM

## 2023-05-03 LAB — POCT RAPID STREP A (OFFICE): Rapid Strep A Screen: NEGATIVE

## 2023-05-03 MED ORDER — FLUOXETINE HCL 20 MG PO TABS: 20.0000 mg | ORAL_TABLET | Freq: Every day | ORAL | 3 refills | Status: DC

## 2023-05-03 MED ORDER — CEFDINIR 300 MG PO CAPS: 300.0000 mg | ORAL_CAPSULE | Freq: Two times a day (BID) | ORAL | 0 refills | Status: DC

## 2023-05-03 NOTE — Patient Instructions (Signed)
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
## 2023-05-06 ENCOUNTER — Ambulatory Visit
Admission: EM | Admit: 2023-05-06 | Discharge: 2023-05-06 | Disposition: A | Discharge status: Home / Self Care | Payer: No Typology Code available for payment source | Attending: Internal Medicine | Admitting: Internal Medicine

## 2023-05-06 DIAGNOSIS — N760 Acute vaginitis: Secondary | ICD-10-CM | POA: Diagnosis not present

## 2023-05-06 DIAGNOSIS — S46812A Strain of other muscles, fascia and tendons at shoulder and upper arm level, left arm, initial encounter: Secondary | ICD-10-CM

## 2023-05-06 MED ORDER — NAPROXEN 375 MG PO TABS: 375.0000 mg | ORAL_TABLET | Freq: Two times a day (BID) | ORAL | 0 refills | Status: AC | PRN

## 2023-05-06 MED ORDER — FLUCONAZOLE 150 MG PO TABS: 150.0000 mg | ORAL_TABLET | Freq: Every day | ORAL | 0 refills | Status: DC

## 2023-05-06 NOTE — ED Provider Notes (Signed)
[facility]    CSN: [id number] Arrival date & time: 05/06/23  1833      History   Chief Complaint Chief Complaint  Patient presents with   Neck Pain   Shortness of Breath        Nausea    HPI Jodi Norman is a 27 y.o. female presents for evaluation of left shoulder pain.  Patient reports yesterday she developed some tenderness/spasm/pain along her left posterior shoulder that radiates into her arm.  Also some pain with rotation of her neck.  Denies any injury.  States she is on antibiotics for "an infection" and just darted this today.  No fevers or chills.  Yesterday she had some pain along her left breast that is since resolved and not returned.  No shortness of breath at time of evaluation and no chest pain.  Endorses intermittent nausea.  She has not taken any OTC medications for symptoms.  No other concerns at this time.   Neck Pain Shortness of Breath Associated symptoms: neck pain     Past Medical History:  Diagnosis Date   Generalized anxiety disorder 06/24/2020   GERD (gastroesophageal reflux disease)    Sarcoidosis     Patient Active Problem List   Diagnosis Date Noted   Elevated AST (SGOT) 04/16/2023   Dehydration 04/16/2023   Sarcoidosis    Preventative health care 01/17/2023   Dysmenorrhea, unspecified 01/17/2023   Tachycardia 01/11/2023   Nausea 01/08/2023   Neck pain 01/08/2023   Acne vulgaris 01/08/2023   Severe anxiety with panic 08/22/2022   High risk heterosexual behavior 08/22/2022   Acute vaginitis 08/22/2022   Positive ANA (antinuclear antibody) 02/07/2022   Myalgia 11/24/2021   Arthralgia 11/14/2021   Iron deficiency anemia 08/17/2021   GERD (gastroesophageal reflux disease)    Morbid obesity (HCC) 06/05/2021   History of scoliosis 03/06/2021   Bilateral thoracic back pain 03/06/2021   Bone lesion 10/27/2020   Daytime somnolence 07/11/2020   Other fatigue 07/11/2020   Metabolic syndrome 07/11/2020   History of  pre-eclampsia  07/11/2020   Obesity (BMI 30-39.9) 07/11/2020   Insomnia 07/08/2020   Intractable migraine without aura and without status migrainosus 07/08/2020   Chest pain 07/08/2020   Generalized anxiety disorder 06/24/2020   Carpal tunnel syndrome, bilateral 05/07/2018   Cesarean delivery delivered 04/28/2018   Right hand paresthesia 06/19/2017    Past Surgical History:  Procedure Laterality Date   CESAREAN SECTION     MOUTH SURGERY  05/2021   UPPER GI ENDOSCOPY  04/27/2021    OB History     Gravida  1   Para      Term      Preterm      AB      Living         SAB      IAB      Ectopic      Multiple      Live Births               Home Medications    Prior to Admission medications   Medication Sig Start Date End Date Taking? Authorizing Provider  fluconazole (DIFLUCAN) 150 MG tablet Take 1 tablet (150 mg total) by mouth daily. 05/06/23  Yes Radford Pax, NP  naproxen (NAPROSYN) 375 MG tablet Take 1 tablet (375 mg total) by mouth 2 (two) times daily as needed. 05/06/23  Yes Radford Pax, NP  cefdinir (OMNICEF) 300 MG capsule Take 1 capsule (  300 mg total) by mouth 2 (two) times daily. 05/03/23   Donato Schultz, DO  FLUoxetine (PROZAC) 20 MG tablet Take 1 tablet (20 mg total) by mouth daily. 05/03/23   Donato Schultz, DO  Iron, Ferrous Sulfate, 325 (65 Fe) MG TABS Take 325 mg by mouth daily. 01/08/23   Donato Schultz, DO  Multiple Vitamin (MULTIVITAMIN) capsule Take 1 capsule by mouth daily.    [provider]  ondansetron (ZOFRAN) 4 MG tablet Take 1 tablet (4 mg total) by mouth every 8 (eight) hours as needed for nausea or vomiting. 01/08/23   Zola Button, Grayling Congress, DO  Vitamin D, Ergocalciferol, (DRISDOL) 1.25 MG (50000 UNIT) CAPS capsule Take 1 capsule (50,000 Units total) by mouth every 7 (seven) days. 07/20/22   Sharlene Dory, DO    Family History Family History  Problem Relation Age of Onset   Hyperlipidemia  Mother    Hypertension Mother    Hyperlipidemia Father    Hypertension Father    Diabetes Father    Colon cancer Neg Hx    Stomach cancer Neg Hx    Esophageal cancer Neg Hx    Pancreatic cancer Neg Hx    Colon polyps Neg Hx    Rectal cancer Neg Hx     Social History Social History   Tobacco Use   Smoking status: Never   Smokeless tobacco: Never  Vaping Use   Vaping status: Never Used  Substance Use Topics   Alcohol use: Never   Drug use: Never     Allergies   Patient has no known allergies.   Review of Systems Review of Systems  Musculoskeletal:  Positive for neck pain.     Physical Exam Triage Vital Signs ED Triage Vitals  Encounter Vitals Group     BP 05/06/23 1937 132/83     Systolic BP Percentile --      Diastolic BP Percentile --      Pulse Rate 05/06/23 1937 86     Resp 05/06/23 1937 16     Temp 05/06/23 1937 98 F (36.7 C)     Temp Source 05/06/23 1937 Oral     SpO2 05/06/23 1937 98 %     Weight --      Height --      Head Circumference --      Peak Flow --      Pain Score 05/06/23 1939 7     Pain Loc --      Pain Education --      Exclude from Growth Chart --    No data found.  Updated Vital Signs BP 132/83 (BP Location: Left Arm)   Pulse 86   Temp 98 F (36.7 C) (Oral)   Resp 16   LMP 03/29/2023 (Approximate)   SpO2 98%   Visual Acuity Right Eye Distance:   Left Eye Distance:   Bilateral Distance:    Right Eye Near:   Left Eye Near:    Bilateral Near:     Physical Exam Vitals and nursing note reviewed.  Constitutional:      General: She is not in acute distress.    Appearance: Normal appearance. She is not ill-appearing.  HENT:     Head: Normocephalic and atraumatic.  Eyes:     Pupils: Pupils are equal, round, and reactive to light.  Neck:   Cardiovascular:     Rate and Rhythm: Normal rate.  Pulmonary:     Effort:  Pulmonary effort is normal.  Musculoskeletal:       Arms:     Comments: There is no swelling or  ecchymosis of the left trapezius muscle.  Tender to palpation to trapezius that extends slightly to left deltoid.  No tenderness palpation to cervical or thoracic spine or cervical muscles.  Full range of motion of neck with some pain with flexion and extension.  Equal strength upper extremities bilaterally at 5 out of 5.  Skin:    General: Skin is warm and dry.  Neurological:     General: No focal deficit present.     Mental Status: She is alert and oriented to person, place, and time.  Psychiatric:        Mood and Affect: Mood normal.        Behavior: Behavior normal.      UC Treatments / Results  Labs (all labs ordered are listed, but only abnormal results are displayed) Labs Reviewed - No data to display  EKG   Radiology No results found.  Procedures Procedures (including critical care time)  Medications Ordered in UC Medications - No data to display  Initial Impression / Assessment and Plan / UC Course  I have reviewed the triage vital signs and the nursing notes.  Pertinent labs & imaging results that were available during my care of the patient were reviewed by me and considered in my medical decision making (see chart for details).     Reviewed exam and symptoms with patient.  No red flags.  Discussed likely musculoskeletal cause of symptoms/spasm.  Will do trial of naproxen and heat.  Patient requested Diflucan as she is on antibiotics, 1 pill sent to pharmacy and advise she needs to contact her prescribing physician for any additional doses.  Patient follow-up with PCP in 2 days for recheck.  ER precautions reviewed. Final Clinical Impressions(s) / UC Diagnoses   Final diagnoses:  Strain of left trapezius muscle, initial encounter  Acute vaginitis     Discharge Instructions      Start naproxen twice daily as needed.  You may take Diflucan as you are on antibiotics to prevent yeast infection.  Heat and rest to the area.  Please follow-up with your PCP in 2  days for recheck.  Please go to the ER for any worsening symptoms.  I hope you feel better soon!    ED Prescriptions     Medication Sig Dispense Auth. Provider   naproxen (NAPROSYN) 375 MG tablet Take 1 tablet (375 mg total) by mouth 2 (two) times daily as needed. 14 tablet Radford Pax, NP   fluconazole (DIFLUCAN) 150 MG tablet Take 1 tablet (150 mg total) by mouth daily. 1 tablet Radford Pax, NP      PDMP not reviewed this encounter.   Radford Pax, NP 05/06/23 1954

## 2023-05-06 NOTE — ED Triage Notes (Signed)
Pt reports left arm pain, pain in the side of left breast, nausea and shortness of breath x 1 day.

## 2023-05-06 NOTE — Discharge Instructions (Signed)
Start naproxen twice daily as needed.  You may take Diflucan as you are on antibiotics to prevent yeast infection.  Heat and rest to the area.  Please follow-up with your PCP in 2 days for recheck.  Please go to the ER for any worsening symptoms.  I hope you feel better soon!

## 2023-05-07 ENCOUNTER — Telehealth: Payer: Self-pay | Admitting: Family Medicine

## 2023-05-07 MED ORDER — FLUOXETINE HCL 20 MG PO CAPS: 20.0000 mg | ORAL_CAPSULE | Freq: Every day | ORAL | 1 refills | Status: DC

## 2023-05-07 NOTE — Telephone Encounter (Signed)
Updated rx sent

## 2023-05-07 NOTE — Telephone Encounter (Signed)
[facility] called stating that pt's insurance will cover the capsule version of FLUoxetine (PROZAC) 20 MG tablet [[id number]] and was looking to get approval to switch it to cut pt OOP cost.

## 2023-05-07 NOTE — Addendum Note (Signed)
Addended by: Roxanne Gates on: 05/07/2023 09:33 AM   Modules accepted: Orders

## 2023-05-24 ENCOUNTER — Ambulatory Visit (INDEPENDENT_AMBULATORY_CARE_PROVIDER_SITE_OTHER): Payer: No Typology Code available for payment source | Admitting: Family Medicine

## 2023-05-24 ENCOUNTER — Encounter: Payer: Self-pay | Admitting: Family Medicine

## 2023-05-24 VITALS — BP 110/60 | HR 85 | Temp 98.5°F | Resp 18 | Ht 66.0 in | Wt 228.0 lb

## 2023-05-24 DIAGNOSIS — B001 Herpesviral vesicular dermatitis: Secondary | ICD-10-CM

## 2023-05-24 DIAGNOSIS — F410 Panic disorder [episodic paroxysmal anxiety] without agoraphobia: Secondary | ICD-10-CM

## 2023-05-24 MED ORDER — FLUOXETINE HCL 40 MG PO CAPS: 40.0000 mg | ORAL_CAPSULE | Freq: Every day | ORAL | 1 refills | Status: DC

## 2023-05-24 MED ORDER — VALACYCLOVIR HCL 1 G PO TABS: ORAL_TABLET | ORAL | 2 refills | Status: DC

## 2023-05-24 NOTE — Progress Notes (Signed)
Established Patient Office Visit  Subjective   Patient ID: TACARRA JANN, female    DOB: 1995/12/03  Age: 27 y.o. MRN: [id number]  Chief Complaint  Patient presents with   Sore Throat    Pt states throat is itchy and feels a cough coming on. Pt has not tried any OTC antihistamines     HPI Discussed the use of AI scribe software for clinical note transcription with the patient, who gave verbal consent to proceed.  History of Present Illness   The patient presents with a chronic infection and has been experiencing an itchy throat. She has not been taking any antihistamines for this symptom. She also reports a dry patch on her skin, which she believes to be a cold sore, although it does not present as a typical sore. This symptom has been recurring in the same spot and comes and goes. 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  The patient is not currently undergoing therapy, although she has not yet been scheduled for [place] treatment. She has been experiencing mental health issues, for which she has been prescribed Prozac. She reports no noticeable difference after two weeks on the medication.   The patient has been prescribed prednisone by a rheumatologist, but she has not yet started taking it due to concerns about potential side effects, including increased anxiety. She has been experiencing inflammation in the lymph nodes on one side of her breast.  The patient also mentions needing to provide additional information for her SMLA, including the number of mental health days she would need per month. She is due to return to work soon and is considering requesting three mental health days per month. She is also dealing with financial difficulties, which have added to her stress.      Patient Active Problem List   Diagnosis Date Noted   Elevated AST (SGOT) 04/16/2023   Dehydration 04/16/2023   Sarcoidosis    Preventative health care 01/17/2023   Dysmenorrhea, unspecified  01/17/2023   Tachycardia 01/11/2023   Nausea 01/08/2023   Neck pain 01/08/2023   Acne vulgaris 01/08/2023   Severe anxiety with panic 08/22/2022   High risk heterosexual behavior 08/22/2022   Acute vaginitis 08/22/2022   Positive ANA (antinuclear antibody) 02/07/2022   Myalgia 11/24/2021   Arthralgia 11/14/2021   Iron deficiency anemia 08/17/2021   GERD (gastroesophageal reflux disease)    Morbid obesity (HCC) 06/05/2021   History of scoliosis 03/06/2021   Bilateral thoracic back pain 03/06/2021   Bone lesion 10/27/2020   Daytime somnolence 07/11/2020   Other fatigue 07/11/2020   Metabolic syndrome 07/11/2020   History of pre-eclampsia  07/11/2020   Obesity (BMI 30-39.9) 07/11/2020   Insomnia 07/08/2020   Intractable migraine without aura and without status migrainosus 07/08/2020   Chest pain 07/08/2020   Generalized anxiety disorder 06/24/2020   Carpal tunnel syndrome, bilateral 05/07/2018   Cesarean delivery delivered 04/28/2018   Right hand paresthesia 06/19/2017   Past Medical History:  Diagnosis Date   Generalized anxiety disorder 06/24/2020   GERD (gastroesophageal reflux disease)    Sarcoidosis    Past Surgical History:  Procedure Laterality Date   CESAREAN SECTION     MOUTH SURGERY  05/2021   UPPER GI ENDOSCOPY  04/27/2021   Social History   Tobacco Use   Smoking status: Never   Smokeless tobacco: Never  Vaping Use   Vaping status: Never Used  Substance Use Topics   Alcohol use: Never   Drug  use: Never   Social History   Socioeconomic History   Marital status: Single    Spouse name: Not on file   Number of children: 1   Years of education: Not on file   Highest education level: Some college, no degree  Occupational History   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[employer]  Tobacco Use   Smoking status: Never   Smokeless tobacco: Never  Vaping Use   Vaping status: Never Used  Substance and Sexual Activity   Alcohol use: Never    Drug use: Never   Sexual activity: Not Currently    Birth control/protection: None  Other Topics Concern   Not on file  Social History Narrative   Right handed   Single, has one daughter born 2019   Works for the post [employer] in [employer]   Social Drivers of Health   Financial Resource Strain: Medium Risk (01/07/2023)   Overall Financial Resource Strain (CARDIA)    Difficulty of Paying Living Expenses: Somewhat hard  Food Insecurity: No Food Insecurity (01/11/2023)   Hunger Vital Sign    Worried About Running Out of Food in the Last Year: Never true    Ran Out of Food in the Last Year: Never true  Recent Concern: Food Insecurity - Food Insecurity Present (01/07/2023)   Hunger Vital Sign    Worried About Running Out of Food in the Last Year: Often true    Ran Out of Food in the Last Year: Often true  Transportation Needs: No Transportation Needs (01/11/2023)   PRAPARE - Administrator, Civil Service (Medical): No    Lack of Transportation (Non-Medical): No  Physical Activity: Insufficiently Active (01/07/2023)   Exercise Vital Sign    Days of Exercise per Week: 5 days    Minutes of Exercise per Session: 10 min  Stress: Stress Concern Present (01/07/2023)   [employer] of Occupational Health - Occupational Stress Questionnaire    Feeling of Stress : To some extent  Social Connections: Moderately Isolated (01/07/2023)   Social Connection and Isolation Panel [NHANES]    Frequency of Communication with Friends and Family: More than three times a week    Frequency of Social Gatherings with Friends and Family: More than three times a week    Attends Religious Services: 1 to 4 times per year    Active Member of [employer] or Organizations: No    Attends Engineer, structural: Not on file    Marital Status: Never married  Intimate Partner Violence: Unknown (01/11/2023)   Humiliation, Afraid, Rape, and Kick questionnaire    Fear of Current or Ex-Partner: Not on file     Emotionally Abused: No    Physically Abused: No    Sexually Abused: No   Family Status  Relation Name Status   Mother  Alive   Father  Alive   Neg Hx  (Not Specified)  No partnership data on file   Family History  Problem Relation Age of Onset   Hyperlipidemia Mother    Hypertension Mother    Hyperlipidemia Father    Hypertension Father    Diabetes Father    Colon cancer Neg Hx    Stomach cancer Neg Hx    Esophageal cancer Neg Hx    Pancreatic cancer Neg Hx    Colon polyps Neg Hx    Rectal cancer Neg Hx    No Known Allergies    Review of Systems  Constitutional:  Negative for  fever and malaise/fatigue.  HENT:  Positive for sore throat. Negative for congestion.   Eyes:  Negative for blurred vision.  Respiratory:  Negative for cough and shortness of breath.   Cardiovascular:  Negative for chest pain, palpitations and leg swelling.  Gastrointestinal:  Negative for vomiting.  Musculoskeletal:  Negative for back pain.  Skin:  Negative for rash.  Neurological:  Negative for loss of consciousness and headaches.      Objective:     BP 110/60 (BP Location: Left Arm, Patient Position: Sitting, Cuff Size: Large)   Pulse 85   Temp 98.5 F (36.9 C) (Oral)   Resp 18   Ht 5\' 6"  (1.676 m)   Wt 228 lb (103.4 kg)   LMP 03/29/2023 (Approximate)   SpO2 97%   BMI 36.80 kg/m  BP Readings from Last 3 Encounters:  05/24/23 110/60  05/06/23 132/83  05/03/23 120/80   Wt Readings from Last 3 Encounters:  05/24/23 228 lb (103.4 kg)  05/03/23 227 lb 9.6 oz (103.2 kg)  04/26/23 220 lb (99.8 kg)   SpO2 Readings from Last 3 Encounters:  05/24/23 97%  05/06/23 98%  05/03/23 96%      Physical Exam Vitals and nursing note reviewed.  Constitutional:      General: She is not in acute distress.    Appearance: Normal appearance. She is well-developed.  HENT:     Head: Normocephalic and atraumatic.  Eyes:     General: No scleral icterus.       Right eye: No discharge.         Left eye: No discharge.  Cardiovascular:     Rate and Rhythm: Normal rate and regular rhythm.     Heart sounds: No murmur heard. Pulmonary:     Effort: Pulmonary effort is normal. No respiratory distress.     Breath sounds: Normal breath sounds.  Musculoskeletal:        General: Normal range of motion.     Cervical back: Normal range of motion and neck supple.     Right lower leg: No edema.     Left lower leg: No edema.  Skin:    General: Skin is warm and dry.     Comments: Dry patch upper lip x 1-2 days   Neurological:     Mental Status: She is alert and oriented to person, place, and time.  Psychiatric:        Mood and Affect: Mood normal.        Behavior: Behavior normal.        Thought Content: Thought content normal.        Judgment: Judgment normal.      No results found for any visits on 05/24/23.  Last CBC Lab Results  Component Value Date   WBC 6.4 04/26/2023   HGB 12.6 04/26/2023   HCT 37.0 04/26/2023   MCV 82.3 04/26/2023   MCH 27.7 04/26/2023   RDW 13.3 04/26/2023   PLT 254 04/26/2023   Last metabolic panel Lab Results  Component Value Date   GLUCOSE 102 (H) 04/26/2023   NA 138 04/26/2023   K 3.4 (L) 04/26/2023   CL 103 04/26/2023   CO2 25 04/15/2023   BUN 6 04/26/2023   CREATININE 0.70 04/26/2023   GFRNONAA >60 04/15/2023   CALCIUM 9.6 04/15/2023   PROT 8.2 (H) 04/15/2023   ALBUMIN 4.4 04/15/2023   BILITOT 0.6 04/15/2023   ALKPHOS 79 04/15/2023   AST 48 (H) 04/15/2023   ALT 23  04/15/2023   ANIONGAP 10 04/15/2023   Last lipids Lab Results  Component Value Date   CHOL 161 01/08/2023   HDL 40.80 01/08/2023   LDLCALC 104 (H) 01/08/2023   TRIG 83.0 01/08/2023   CHOLHDL 4 01/08/2023   Last hemoglobin A1c Lab Results  Component Value Date   HGBA1C 6.1 07/20/2022   Last thyroid functions Lab Results  Component Value Date   TSH 0.45 04/04/2023   T4TOTAL 7.5 04/04/2023   Last vitamin D Lab Results  Component Value Date   VD25OH  19.65 (L) 04/04/2023   Last vitamin B12 and Folate Lab Results  Component Value Date   VITAMINB12 212 04/04/2023      The ASCVD Risk score (Arnett DK, et al., 2019) failed to calculate for the following reasons:   The 2019 ASCVD risk score is only valid for ages 7 to 12    Assessment & Plan:   Problem List Items Addressed This Visit       Unprioritized   Severe anxiety with panic - Primary   Relevant Medications   FLUoxetine (PROZAC) 40 MG capsule   Other Visit Diagnoses       Cold sore       Relevant Medications   valACYclovir (VALTREX) 1000 MG tablet     Assessment and Plan    Itchy Throat   She reports an itchy throat and has not yet taken any antihistamines. We will start an antihistamine, such as Allegra or Zyrtec, for symptom relief.  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 While we discussed the potential for dry skin due to cold weather, we prescribed Valtrex for the cold sores. She will take 2 tablets of Valtrex immediately, then 2 tablets in 12 hours, and apply moisturizer, such as CeraVe, cocoa butter, or Vaseline, to the affected area.  Depression   She is currently on Prozac with no significant improvement and is experiencing anxiety, with concerns about prednisone interaction. We discussed the potential for increased anxiety with prednisone and suggested increasing the Prozac dosage. She will increase Prozac to 40 mg daily, continue current therapy sessions, and discuss prednisone side effects with her rheumatologist.  Vitamin D Deficiency   She inquired about vitamin D supplementation. We recommended over-the-counter vitamin D3, advising her to take 2000 IU daily.  General Health Maintenance   She inquired about routine blood work for vitamin levels. We confirmed annual checks as per insurance coverage and advised routine blood work for vitamin  levels, such as calcium and potassium, once or twice a year.  Follow-up   She will follow up with her therapist and rheumatologist. We will provide documentation for mental health days to her employer.      Fmla paperwork filled out and scanned in chart  Return in about 4 weeks (around 06/21/2023), or if symptoms worsen or fail to improve.    Donato Schultz, DO

## 2023-06-04 ENCOUNTER — Other Ambulatory Visit: Payer: Self-pay

## 2023-06-04 ENCOUNTER — Emergency Department (HOSPITAL_BASED_OUTPATIENT_CLINIC_OR_DEPARTMENT_OTHER)
Admission: EM | Admit: 2023-06-04 | Discharge: 2023-06-05 | Disposition: A | Discharge status: Home / Self Care | Payer: No Typology Code available for payment source | Attending: Emergency Medicine | Admitting: Emergency Medicine

## 2023-06-04 ENCOUNTER — Encounter (HOSPITAL_BASED_OUTPATIENT_CLINIC_OR_DEPARTMENT_OTHER): Payer: Self-pay | Admitting: Urology

## 2023-06-04 ENCOUNTER — Emergency Department (HOSPITAL_BASED_OUTPATIENT_CLINIC_OR_DEPARTMENT_OTHER): Payer: No Typology Code available for payment source

## 2023-06-04 DIAGNOSIS — R519 Headache, unspecified: Secondary | ICD-10-CM

## 2023-06-04 DIAGNOSIS — G44219 Episodic tension-type headache, not intractable: Secondary | ICD-10-CM | POA: Insufficient documentation

## 2023-06-04 DIAGNOSIS — R9431 Abnormal electrocardiogram [ECG] [EKG]: Secondary | ICD-10-CM | POA: Diagnosis not present

## 2023-06-04 DIAGNOSIS — Z20822 Contact with and (suspected) exposure to covid-19: Secondary | ICD-10-CM | POA: Insufficient documentation

## 2023-06-04 DIAGNOSIS — R202 Paresthesia of skin: Secondary | ICD-10-CM | POA: Diagnosis not present

## 2023-06-04 LAB — CBC WITH DIFFERENTIAL/PLATELET
Abs Immature Granulocytes: 0.02 10*3/uL (ref 0.00–0.07)
Basophils Absolute: 0 10*3/uL (ref 0.0–0.1)
Basophils Relative: 0 %
Eosinophils Absolute: 0.1 10*3/uL (ref 0.0–0.5)
Eosinophils Relative: 2 %
HCT: 30.1 % — ABNORMAL LOW (ref 36.0–46.0)
Hemoglobin: 9.8 g/dL — ABNORMAL LOW (ref 12.0–15.0)
Immature Granulocytes: 0 %
Lymphocytes Relative: 41 %
Lymphs Abs: 3.1 10*3/uL (ref 0.7–4.0)
MCH: 27.5 pg (ref 26.0–34.0)
MCHC: 32.6 g/dL (ref 30.0–36.0)
MCV: 84.6 fL (ref 80.0–100.0)
Monocytes Absolute: 0.4 10*3/uL (ref 0.1–1.0)
Monocytes Relative: 6 %
Neutro Abs: 3.8 10*3/uL (ref 1.7–7.7)
Neutrophils Relative %: 51 %
Platelets: 246 10*3/uL (ref 150–400)
RBC: 3.56 MIL/uL — ABNORMAL LOW (ref 3.87–5.11)
RDW: 14.1 % (ref 11.5–15.5)
WBC: 7.4 10*3/uL (ref 4.0–10.5)
nRBC: 0 % (ref 0.0–0.2)

## 2023-06-04 LAB — COMPREHENSIVE METABOLIC PANEL
ALT: 11 U/L (ref 0–44)
AST: 13 U/L — ABNORMAL LOW (ref 15–41)
Albumin: 3.4 g/dL — ABNORMAL LOW (ref 3.5–5.0)
Alkaline Phosphatase: 73 U/L (ref 38–126)
Anion gap: 7 (ref 5–15)
BUN: 11 mg/dL (ref 6–20)
CO2: 23 mmol/L (ref 22–32)
Calcium: 8.4 mg/dL — ABNORMAL LOW (ref 8.9–10.3)
Chloride: 109 mmol/L (ref 98–111)
Creatinine, Ser: 0.74 mg/dL (ref 0.44–1.00)
GFR, Estimated: >60 mL/min (ref >60)
Glucose, Bld: 98 mg/dL (ref 70–99)
Potassium: 3.6 mmol/L (ref 3.5–5.1)
Sodium: 139 mmol/L (ref 135–145)
Total Bilirubin: 0.2 mg/dL (ref 0.0–1.2)
Total Protein: 6.6 g/dL (ref 6.5–8.1)

## 2023-06-04 LAB — URINALYSIS, ROUTINE W REFLEX MICROSCOPIC
Bilirubin Urine: NEGATIVE
Glucose, UA: NEGATIVE mg/dL
Hgb urine dipstick: NEGATIVE
Ketones, ur: NEGATIVE mg/dL
Leukocytes,Ua: NEGATIVE
Nitrite: NEGATIVE
Protein, ur: NEGATIVE mg/dL
Specific Gravity, Urine: >=1.03 (ref 1.005–1.030)
pH: 6 (ref 5.0–8.0)

## 2023-06-04 LAB — RESP PANEL BY RT-PCR (RSV, FLU A&B, COVID)  RVPGX2
Influenza A by PCR: NEGATIVE
Influenza B by PCR: NEGATIVE
Resp Syncytial Virus by PCR: NEGATIVE
SARS Coronavirus 2 by RT PCR: NEGATIVE

## 2023-06-04 NOTE — ED Triage Notes (Signed)
 Pt states felt numbness in right arm that started yesterday  State numbness and tingling to right side of face  Headache that started today    No h/o migraines

## 2023-06-04 NOTE — ED Notes (Signed)
 Pt states body aches  and wants covid test H/o sarcoidosis

## 2023-06-04 NOTE — ED Notes (Signed)
 VAN negative, normal grips bilaterally, no facial droop noted, normal speech

## 2023-06-05 MED ORDER — PROCHLORPERAZINE EDISYLATE 10 MG/2ML IJ SOLN
10.0000 mg | Freq: Once | INTRAMUSCULAR | Status: AC
  Administered 2023-06-05: 10 mg via INTRAVENOUS
  Filled 2023-06-05: qty 2

## 2023-06-05 MED ORDER — KETOROLAC TROMETHAMINE 30 MG/ML IJ SOLN
30.0000 mg | Freq: Once | INTRAMUSCULAR | Status: AC
  Administered 2023-06-05: 30 mg via INTRAVENOUS
  Filled 2023-06-05: qty 1

## 2023-06-05 MED ORDER — DIPHENHYDRAMINE HCL 50 MG/ML IJ SOLN: 25.0000 mg | Freq: Once | INTRAMUSCULAR | Status: DC

## 2023-06-05 NOTE — ED Notes (Signed)
 Pt reports feeling very weird after med admin. Pt reports she does not like the feeling and would like for it to stop. Alerted MD Bernette Mayers.

## 2023-06-05 NOTE — ED Provider Notes (Signed)
 [facility]  Provider Note  CSN: [id number] Arrival date & time: 06/04/23 2152  History Chief Complaint  Patient presents with   Numbness    Jodi Norman is a 28 y.o. female with history of sarcoidosis, anxiety, headaches reports 2 days of numbness in her R arm, described as a tingling. Also some in her R face, not associated with weakness. She has had a posterior headache today which is not unusual for her. She reports she has been referred to Neurology in the past to evaluate for neurosarcoidosis, but she never went to the follow up appointments due to claustrophobia and her anticipation she would be sent for MRI.    Home Medications Prior to Admission medications   Medication Sig Start Date End Date Taking? Authorizing Provider  cefdinir  (OMNICEF ) 300 MG capsule Take 1 capsule (300 mg total) by mouth 2 (two) times daily. 05/03/23   Antonio Cyndee Jamee JONELLE, DO  fluconazole  (DIFLUCAN ) 150 MG tablet Take 1 tablet (150 mg total) by mouth daily. 05/06/23   Mayer, Jodi R, NP  FLUoxetine  (PROZAC ) 40 MG capsule Take 1 capsule (40 mg total) by mouth daily. 05/24/23   Antonio Cyndee Jamee JONELLE, DO  Iron , Ferrous Sulfate , 325 (65 Fe) MG TABS Take 325 mg by mouth daily. 01/08/23   Lowne Chase, Yvonne R, DO  Multiple Vitamin (MULTIVITAMIN) capsule Take 1 capsule by mouth daily.    [provider]  naproxen  (NAPROSYN ) 375 MG tablet Take 1 tablet (375 mg total) by mouth 2 (two) times daily as needed. 05/06/23   Mayer, Jodi R, NP  ondansetron  (ZOFRAN ) 4 MG tablet Take 1 tablet (4 mg total) by mouth every 8 (eight) hours as needed for nausea or vomiting. 01/08/23   Antonio Cyndee, Jamee JONELLE, DO  valACYclovir  (VALTREX ) 1000 MG tablet 2 po x1 and repeat in 12 hours 05/24/23   Antonio Cyndee Jamee JONELLE, DO  Vitamin D , Ergocalciferol , (DRISDOL ) 1.25 MG (50000 UNIT) CAPS capsule Take 1 capsule (50,000 Units total) by mouth every 7 (seven) days. 07/20/22   Frann Mabel Mt, DO     Allergies    Patient has no known allergies.   Review of Systems   Review of Systems Please see HPI for pertinent positives and negatives  Physical Exam BP (!) 144/82 (BP Location: Right Arm)   Pulse 79   Temp 98 F (36.7 C) (Oral)   Resp 18   Ht 5' 6 (1.676 m)   Wt 103.4 kg   LMP 05/31/2023 (Approximate)   SpO2 100%   BMI 36.79 kg/m   Physical Exam Vitals and nursing note reviewed.  Constitutional:      Appearance: Normal appearance.  HENT:     Head: Normocephalic and atraumatic.     Nose: Nose normal.     Mouth/Throat:     Mouth: Mucous membranes are moist.  Eyes:     Extraocular Movements: Extraocular movements intact.     Conjunctiva/sclera: Conjunctivae normal.  Cardiovascular:     Rate and Rhythm: Normal rate.     Pulses: Normal pulses.  Pulmonary:     Effort: Pulmonary effort is normal.     Breath sounds: Normal breath sounds.  Abdominal:     General: Abdomen is flat.     Palpations: Abdomen is soft.     Tenderness: There is no abdominal tenderness.  Musculoskeletal:        General: No swelling. Normal range of motion.  Cervical back: Neck supple.  Skin:    General: Skin is warm and dry.  Neurological:     General: No focal deficit present.     Mental Status: She is alert and oriented to person, place, and time.     Cranial Nerves: No cranial nerve deficit.     Sensory: Sensory deficit (subjective change in sensation to LUE compared to RUE without loss of sensation) present.     Motor: No weakness.     Coordination: Coordination normal.  Psychiatric:        Mood and Affect: Mood normal.     ED Results / Procedures / Treatments   EKG EKG Interpretation Date/Time:  Tuesday June 04 2023 22:03:14 EST Ventricular Rate:  78 PR Interval:  130 QRS Duration:  88 QT Interval:  360 QTC Calculation: 410 R Axis:   69  Text Interpretation: Normal sinus rhythm with sinus arrhythmia Nonspecific ST and T wave abnormality  Abnormal ECG When compared with ECG of 26-Apr-2023 02:19, PREVIOUS ECG IS PRESENT Confirmed by Ruthe Cornet [phone]) on 06/04/2023 10:08:35 PM  Procedures Procedures  Medications Ordered in the ED Medications  diphenhydrAMINE  (BENADRYL ) injection 25 mg (has no administration in time range)  ketorolac  (TORADOL ) 30 MG/ML injection 30 mg (30 mg Intravenous Given 06/05/23 0018)  prochlorperazine  (COMPAZINE ) injection 10 mg (10 mg Intravenous Given 06/05/23 0017)    Initial Impression and Plan  Patient here with numbness in L arm and a headache. Neuro exam is reassuring. Low concern for CVA. More likely this is a complex migraine or cervical radiculopathy. She had labs done in triage showing CBC with anemia, mildly worse than prior, CMP is unremarkable. Covid/Flu/RSV swab is neg. UA is clear. I personally viewed the images from radiology studies and agree with radiologist interpretation: CT head is normal. Will give headache cocktail and reassess for improvement.   ED Course   Clinical Course as of 06/05/23 0050  Wed Jun 05, 2023  0048 Patient has had an akathisia reaction to compazine . Offered benadryl , but she is driving home. She otherwise reports improvement in headache and would like to be discharged. Advised to follow up with Neurology. RTED for any other concerns.  [CS]    Clinical Course User Index [CS] Roselyn Carlin NOVAK, MD     MDM Rules/Calculators/A&P Medical Decision Making Given presenting complaint, I considered that admission might be necessary. After review of results from ED lab and/or imaging studies, admission to the hospital is not indicated at this time.    Problems Addressed: Nonintractable episodic headache, unspecified headache type: acute illness or injury Paresthesia: acute illness or injury  Amount and/or Complexity of Data Reviewed Labs: ordered. Decision-making details documented in ED Course. Radiology: ordered and independent interpretation performed.  Decision-making details documented in ED Course. ECG/medicine tests: ordered and independent interpretation performed. Decision-making details documented in ED Course.  Risk Prescription drug management. Decision regarding hospitalization.     Final Clinical Impression(s) / ED Diagnoses Final diagnoses:  Paresthesia  Nonintractable episodic headache, unspecified headache type    Rx / DC Orders ED Discharge Orders          Ordered    Ambulatory referral to Neurology       Comments: An appointment is requested in approximately: 2 weeks   06/05/23 0049             Roselyn Carlin NOVAK, MD 06/05/23 [phone]

## 2023-06-08 ENCOUNTER — Other Ambulatory Visit: Payer: Self-pay | Admitting: Family Medicine

## 2023-06-08 DIAGNOSIS — B001 Herpesviral vesicular dermatitis: Secondary | ICD-10-CM

## 2023-06-08 DIAGNOSIS — F410 Panic disorder [episodic paroxysmal anxiety] without agoraphobia: Secondary | ICD-10-CM

## 2023-06-10 ENCOUNTER — Other Ambulatory Visit: Payer: Self-pay

## 2023-06-10 ENCOUNTER — Other Ambulatory Visit: Payer: Self-pay | Admitting: Family Medicine

## 2023-06-10 MED ORDER — LEVONORGESTREL 1.5 MG PO TABS: 1.5000 mg | ORAL_TABLET | Freq: Once | ORAL | 0 refills | Status: AC

## 2023-06-11 ENCOUNTER — Other Ambulatory Visit: Payer: Self-pay | Admitting: Family Medicine

## 2023-06-11 ENCOUNTER — Ambulatory Visit (INDEPENDENT_AMBULATORY_CARE_PROVIDER_SITE_OTHER): Payer: Self-pay | Admitting: Family Medicine

## 2023-06-11 ENCOUNTER — Ambulatory Visit: Payer: Self-pay

## 2023-06-11 VITALS — BP 113/72 | HR 83 | Temp 98.7°F | Ht 66.0 in | Wt 228.5 lb

## 2023-06-11 DIAGNOSIS — D508 Other iron deficiency anemias: Secondary | ICD-10-CM

## 2023-06-11 DIAGNOSIS — Z3009 Encounter for other general counseling and advice on contraception: Secondary | ICD-10-CM

## 2023-06-11 DIAGNOSIS — R102 Pelvic and perineal pain: Secondary | ICD-10-CM

## 2023-06-11 DIAGNOSIS — R202 Paresthesia of skin: Secondary | ICD-10-CM

## 2023-06-11 DIAGNOSIS — Z309 Encounter for contraceptive management, unspecified: Secondary | ICD-10-CM

## 2023-06-11 DIAGNOSIS — M545 Low back pain, unspecified: Secondary | ICD-10-CM

## 2023-06-11 DIAGNOSIS — R200 Anesthesia of skin: Secondary | ICD-10-CM

## 2023-06-11 DIAGNOSIS — R8290 Unspecified abnormal findings in urine: Secondary | ICD-10-CM

## 2023-06-11 MED ORDER — MELOXICAM 15 MG PO TABS: 15.0000 mg | ORAL_TABLET | Freq: Every day | ORAL | 0 refills | Status: DC

## 2023-06-11 MED ORDER — CYCLOBENZAPRINE HCL 10 MG PO TABS
10.0000 mg | ORAL_TABLET | Freq: Three times a day (TID) | ORAL | 0 refills | Status: DC | PRN
Start: 2023-06-11 — End: 2023-11-05

## 2023-06-11 MED ORDER — LEVONORGESTREL 1.5 MG PO TABS: 1.5000 mg | ORAL_TABLET | Freq: Once | ORAL | Status: DC

## 2023-06-11 NOTE — Progress Notes (Signed)
 Established Patient Office Visit  Subjective   Patient ID: Jodi Norman, female    DOB: [birth year]  Age: 28 y.o. MRN: [id number]  Chief Complaint  Patient presents with   lab results    Wanting to talk about lab results- ED labs Also needing script for plan B  pill     HPI Discussed the use of AI scribe software for clinical note transcription with the patient, who gave verbal consent to proceed.  History of Present Illness   The patient, with a known history of sarcoidosis and anemia, presented with new onset numbness and tingling in the right side of the face and right arm. The symptoms were intermittent, lasting for two days, and were associated with a severe headache. The patient sought emergency care where blood work was performed and a referral to neurology was made.  The patient also reported a decrease in hemoglobin levels from her usual 11 to 9.8, despite taking vitamins. She denied any changes in menstrual flow or bowel movements. The patient had previously stopped taking iron  supplements due to perceived improvement and high iron  levels.  Additionally, the patient noted a change in the smell of her urine, describing it as a strong stench, despite maintaining good hydration and adding Celtic salt to her water. The patient suspected the change might be due to the vitamins she was taking, which included Maca, turmeric, and probiotics.  The patient also reported a recent fall at work, landing on her knees and hands. The impact resulted in pain in the shoulders and lower back. The patient was evaluated at an occupational health clinic where x-rays were performed and over-the-counter pain medications were recommended. However, the patient reported ongoing pain and was seeking additional pain management options.  The patient expressed frustration with her ongoing low iron  and hemoglobin levels and was considering iron  infusions as a potential treatment option. She planned to continue  taking her iron  supplements and recheck her levels in a month.      Patient Active Problem List   Diagnosis Date Noted   Numbness and tingling 06/12/2023   Abnormal urine odor 06/12/2023   Elevated AST (SGOT) 04/16/2023   Dehydration 04/16/2023   Sarcoidosis    Preventative health care 01/17/2023   Dysmenorrhea, unspecified 01/17/2023   Tachycardia 01/11/2023   Nausea 01/08/2023   Neck pain 01/08/2023   Acne vulgaris 01/08/2023   Severe anxiety with panic 08/22/2022   High risk heterosexual behavior 08/22/2022   Acute vaginitis 08/22/2022   Positive ANA (antinuclear antibody) 02/07/2022   Myalgia 11/24/2021   Arthralgia 11/14/2021   Iron  deficiency anemia 08/17/2021   GERD (gastroesophageal reflux disease)    Morbid obesity (HCC) 06/05/2021   History of scoliosis 03/06/2021   Bilateral thoracic back pain 03/06/2021   Bone lesion 10/27/2020   Daytime somnolence 07/11/2020   Other fatigue 07/11/2020   Metabolic syndrome 07/11/2020   History of pre-eclampsia  07/11/2020   Obesity (BMI 30-39.9) 07/11/2020   Insomnia 07/08/2020   Intractable migraine without aura and without status migrainosus 07/08/2020   Chest pain 07/08/2020   Generalized anxiety disorder 06/24/2020   Carpal tunnel syndrome, bilateral 05/07/2018   Cesarean delivery delivered 04/28/2018   Right hand paresthesia 06/19/2017   Past Medical History:  Diagnosis Date   Generalized anxiety disorder 06/24/2020   GERD (gastroesophageal reflux disease)    Sarcoidosis    Past Surgical History:  Procedure Laterality Date   CESAREAN SECTION     MOUTH SURGERY  05/2021  UPPER GI ENDOSCOPY  04/27/2021   Social History   Tobacco Use   Smoking status: Never   Smokeless tobacco: Never  Vaping Use   Vaping status: Never Used  Substance Use Topics   Alcohol use: Never   Drug use: Never   Social History   Socioeconomic History   Marital status: Single    Spouse name: Not on file   Number of children: 1    Years of education: Not on file   Highest education level: Some college, no degree  Occupational History   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[employer]  Tobacco Use   Smoking status: Never   Smokeless tobacco: Never  Vaping Use   Vaping status: Never Used  Substance and Sexual Activity   Alcohol use: Never   Drug use: Never   Sexual activity: Not Currently    Birth control/protection: None  Other Topics Concern   Not on file  Social History Narrative   Right handed   Single, has one daughter born 2019   Works for the post [employer] in [employer]   Social Drivers of Health   Financial Resource Strain: Medium Risk (01/07/2023)   Overall Financial Resource Strain (CARDIA)    Difficulty of Paying Living Expenses: Somewhat hard  Food Insecurity: No Food Insecurity (01/11/2023)   Hunger Vital Sign    Worried About Running Out of Food in the Last Year: Never true    Ran Out of Food in the Last Year: Never true  Recent Concern: Food Insecurity - Food Insecurity Present (01/07/2023)   Hunger Vital Sign    Worried About Running Out of Food in the Last Year: Often true    Ran Out of Food in the Last Year: Often true  Transportation Needs: No Transportation Needs (01/11/2023)   PRAPARE - Administrator, Civil Service (Medical): No    Lack of Transportation (Non-Medical): No  Physical Activity: Insufficiently Active (01/07/2023)   Exercise Vital Sign    Days of Exercise per Week: 5 days    Minutes of Exercise per Session: 10 min  Stress: Stress Concern Present (01/07/2023)   [employer] of Occupational Health - Occupational Stress Questionnaire    Feeling of Stress : To some extent  Social Connections: Moderately Isolated (01/07/2023)   Social Connection and Isolation Panel [NHANES]    Frequency of Communication with Friends and Family: More than three times a week    Frequency of Social Gatherings with Friends and Family: More than three times a week    Attends  Religious Services: 1 to 4 times per year    Active Member of [employer] or Organizations: No    Attends Engineer, Structural: Not on file    Marital Status: Never married  Intimate Partner Violence: Unknown (01/11/2023)   Humiliation, Afraid, Rape, and Kick questionnaire    Fear of Current or Ex-Partner: Not on file    Emotionally Abused: No    Physically Abused: No    Sexually Abused: No   Family Status  Relation Name Status   Mother  Alive   Father  Alive   Neg Hx  (Not Specified)  No partnership data on file   Family History  Problem Relation Age of Onset   Hyperlipidemia Mother    Hypertension Mother    Hyperlipidemia Father    Hypertension Father    Diabetes Father    Colon cancer Neg Hx    Stomach cancer Neg Hx  Esophageal cancer Neg Hx    Pancreatic cancer Neg Hx    Colon polyps Neg Hx    Rectal cancer Neg Hx    No Known Allergies    Review of Systems  Constitutional:  Negative for fever and malaise/fatigue.  HENT:  Negative for congestion.   Eyes:  Negative for blurred vision.  Respiratory:  Negative for shortness of breath.   Cardiovascular:  Negative for chest pain, palpitations and leg swelling.  Gastrointestinal:  Negative for abdominal pain, blood in stool and nausea.  Genitourinary:  Negative for dysuria and frequency.  Musculoskeletal:  Negative for falls.  Skin:  Negative for rash.  Neurological:  Positive for tingling. Negative for dizziness, focal weakness, loss of consciousness and headaches.  Endo/Heme/Allergies:  Negative for environmental allergies.  Psychiatric/Behavioral:  Negative for depression. The patient is not nervous/anxious.       Objective:     BP 113/72   Pulse 83   Temp 98.7 F (37.1 C) (Oral)   Ht 5' 6 (1.676 m)   Wt 228 lb 8 oz (103.6 kg)   LMP 05/31/2023 (Approximate)   SpO2 100%   BMI 36.88 kg/m  BP Readings from Last 3 Encounters:  06/11/23 113/72  06/05/23 (!) 110/96  05/24/23 110/60   Wt Readings  from Last 3 Encounters:  06/11/23 228 lb 8 oz (103.6 kg)  06/04/23 227 lb 15.3 oz (103.4 kg)  05/24/23 228 lb (103.4 kg)   SpO2 Readings from Last 3 Encounters:  06/11/23 100%  06/05/23 100%  05/24/23 97%      Physical Exam Vitals and nursing note reviewed.  Constitutional:      General: She is not in acute distress.    Appearance: Normal appearance. She is well-developed.  HENT:     Head: Normocephalic and atraumatic.  Eyes:     General: No scleral icterus.       Right eye: No discharge.        Left eye: No discharge.  Cardiovascular:     Rate and Rhythm: Normal rate and regular rhythm.     Heart sounds: No murmur heard. Pulmonary:     Effort: Pulmonary effort is normal. No respiratory distress.     Breath sounds: Normal breath sounds.  Musculoskeletal:        General: Normal range of motion.     Cervical back: Normal range of motion and neck supple.     Right lower leg: No edema.     Left lower leg: No edema.  Skin:    General: Skin is warm and dry.  Neurological:     General: No focal deficit present.     Mental Status: She is alert and oriented to person, place, and time.     Sensory: No sensory deficit.     Motor: No weakness.  Psychiatric:        Mood and Affect: Mood normal.        Behavior: Behavior normal.        Thought Content: Thought content normal.        Judgment: Judgment normal.      No results found for any visits on 06/11/23.  Last CBC Lab Results  Component Value Date   WBC 7.4 06/04/2023   HGB 9.8 (L) 06/04/2023   HCT 30.1 (L) 06/04/2023   MCV 84.6 06/04/2023   MCH 27.5 06/04/2023   RDW 14.1 06/04/2023   PLT 246 06/04/2023   Last metabolic panel Lab Results  Component Value Date  GLUCOSE 98 06/04/2023   NA 139 06/04/2023   K 3.6 06/04/2023   CL 109 06/04/2023   CO2 23 06/04/2023   BUN 11 06/04/2023   CREATININE 0.74 06/04/2023   GFRNONAA >60 06/04/2023   CALCIUM  8.4 (L) 06/04/2023   PROT 6.6 06/04/2023   ALBUMIN 3.4 (L)  06/04/2023   BILITOT 0.2 06/04/2023   ALKPHOS 73 06/04/2023   AST 13 (L) 06/04/2023   ALT 11 06/04/2023   ANIONGAP 7 06/04/2023   Last lipids Lab Results  Component Value Date   CHOL 161 01/08/2023   HDL 40.80 01/08/2023   LDLCALC 104 (H) 01/08/2023   TRIG 83.0 01/08/2023   CHOLHDL 4 01/08/2023   Last hemoglobin A1c Lab Results  Component Value Date   HGBA1C 6.1 07/20/2022   Last thyroid  functions Lab Results  Component Value Date   TSH 0.45 04/04/2023   T4TOTAL 7.5 04/04/2023   Last vitamin D  Lab Results  Component Value Date   VD25OH 19.65 (L) 04/04/2023   Last vitamin B12 and Folate Lab Results  Component Value Date   VITAMINB12 212 04/04/2023      The ASCVD Risk score (Arnett DK, et al., 2019) failed to calculate for the following reasons:   The 2019 ASCVD risk score is only valid for ages 85 to 70    Assessment & Plan:   Problem List Items Addressed This Visit       Unprioritized   Iron  deficiency anemia   Relevant Orders   Ambulatory referral to Hematology / Oncology   Numbness and tingling - Primary   Neurology referral pending  Er visit reviewed       Abnormal urine odor   Relevant Orders   POCT Urinalysis Dipstick (Automated)   Urine Culture  Assessment and Plan    Right-sided Numbness and Tingling Intermittent numbness and tingling in the right side of the face and right arm have been present for two days, possibly related to sarcoidosis. Referred to neurology for further evaluation. Follow up with neurology.  Migraine Severe migraine occurs concurrently with numbness and tingling. No details on frequency or triggers provided.  Acute Musculoskeletal Pain Shoulder and lower back pain followed a fall at work. X-rays of the pelvis showed no fractures. There is burning pain in the knees and pressure pain in the shoulders and back. Meloxicam  and muscle relaxers were discussed for pain management. Prescribe meloxicam  and a muscle relaxer.  Advise ice application to affected areas.  Iron  Deficiency Anemia Hemoglobin level is 9.8, lower than the usual 11, with inconsistent iron  supplement intake. There is no heavy menstrual bleeding or bowel changes. Currently taking a multivitamin with iron . Discussed potential need for iron  infusions and possible infusion reactions. Resume iron  supplementation and recheck hemoglobin in one month. Consider iron  infusions if oral supplementation fails.  Urine Odor There is a strong, unusual urine odor with a clear appearance, but no discharge or other urinary symptoms. Vitamin intake may contribute. Collect a urine sample for analysis.  General Health Maintenance Consistent birth control use was discussed, with discomfort noted with Plan B . Considering other birth control options. Discuss birth control options and consider a gynecology referral.  Follow-up Schedule a lab appointment in one month to recheck hemoglobin. Collect a first-morning urine sample for analysis.        Return if symptoms worsen or fail to improve.    Kalicia Dufresne R Lowne Chase, DO

## 2023-06-12 ENCOUNTER — Encounter: Payer: Self-pay | Admitting: Family Medicine

## 2023-06-12 DIAGNOSIS — R200 Anesthesia of skin: Secondary | ICD-10-CM | POA: Insufficient documentation

## 2023-06-12 DIAGNOSIS — R8290 Unspecified abnormal findings in urine: Secondary | ICD-10-CM | POA: Insufficient documentation

## 2023-06-12 NOTE — Patient Instructions (Signed)
Paresthesia Paresthesia is an abnormal burning or prickling sensation. It is usually felt in the hands, arms, legs, or feet. However, it may occur in any part of the body. Usually, paresthesia is not painful. It may feel like: Tingling or numbness. Buzzing. Itching. Paresthesia may occur without any clear cause, or it may be caused by: Breathing too quickly (hyperventilation). Pressure on a nerve. An underlying medical condition. Side effects of a medicine. Nutritional deficiencies. Exposure to toxic chemicals. Most people experience temporary (transient) paresthesia at some time in their lives. For some people, it may be long-lasting (chronic) because of an underlying medical condition. If you have paresthesia that lasts a long time, you need to be evaluated by your health care provider. Follow these instructions at home: Nutrition Eat a healthy diet. This includes: Eating foods that are high in fiber, such as beans, whole grains, and fresh fruits and vegetables. Limiting foods that are high in fat and processed sugars, such as fried or sweet foods.  Alcohol use  Avoid or limit alcohol. Too much alcohol can cause a vitamin B deficiency, and vitamin B is needed for healthy nerves. Do not drink alcohol if: Your health care provider tells you not to drink. You are pregnant, may be pregnant, or are planning to become pregnant. If you drink alcohol: Limit how much you have to: 0-1 drink a day for women. 0-2 drinks a day for men. Know how much alcohol is in your drink. In the [place] one drink equals one 12 oz bottle of beer (355 mL), one 5 oz glass of wine (148 mL), or one 1 oz glass of hard liquor (44 mL). General instructions Take over-the-counter and prescription medicines only as told by your health care provider. Do not use any products that contain nicotine or tobacco. These products include cigarettes, chewing tobacco, and vaping devices, such as e-cigarettes. If you need help  quitting, ask your health care provider. If you have diabetes, work closely with your health care provider to keep your blood sugar under control. If you have numbness in your feet: Check every day for signs of injury or infection. Watch for redness, warmth, and swelling. Wear padded socks and comfortable shoes. These help protect your feet. Keep all follow-up visits. This is important. Contact a health care provider if you: Have paresthesia that gets worse or does not go away. Have numbness after an injury. Have a burning or prickling feeling that gets worse when you walk. Have pain, cramps, or dizziness, or you faint. Develop a rash. Get help right away if you: Feel muscle weakness. Develop new weakness in an arm or leg. Have trouble walking or moving. Have problems with speech, understanding, or vision. Feel confused. Cannot control your bladder or bowel movements. These symptoms may be an emergency. Get help right away. Call 911. Do not wait to see if the symptoms will go away. Do not drive yourself to the hospital. Summary Paresthesia is an abnormal burning or prickling sensation that is usually felt in the hands, arms, legs, or feet. It may also occur in other parts of the body. Paresthesia may occur without any clear cause, or it may be caused by breathing too quickly (hyperventilation), pressure on a nerve, an underlying medical condition, side effects of a medicine, nutritional deficiencies, or exposure to toxic chemicals. If you have paresthesia that lasts a long time, you need to be evaluated by your health care provider. This information is not intended to replace advice given to you by  your health care provider. Make sure you discuss any questions you have with your health care provider. Document Revised: 01/23/2021 Document Reviewed: 01/23/2021 Elsevier Patient Education  2024 [employer].

## 2023-06-12 NOTE — Assessment & Plan Note (Signed)
 Neurology referral pending  Er visit reviewed

## 2023-07-08 ENCOUNTER — Other Ambulatory Visit: Payer: Self-pay | Admitting: Family Medicine

## 2023-07-08 ENCOUNTER — Other Ambulatory Visit: Payer: Self-pay

## 2023-07-08 ENCOUNTER — Ambulatory Visit
Admission: EM | Admit: 2023-07-08 | Discharge: 2023-07-08 | Disposition: A | Discharge status: Home / Self Care | Payer: BC Managed Care – PPO | Attending: Family Medicine | Admitting: Family Medicine

## 2023-07-08 DIAGNOSIS — J329 Chronic sinusitis, unspecified: Secondary | ICD-10-CM

## 2023-07-08 DIAGNOSIS — M545 Low back pain, unspecified: Secondary | ICD-10-CM

## 2023-07-08 DIAGNOSIS — J40 Bronchitis, not specified as acute or chronic: Secondary | ICD-10-CM | POA: Diagnosis not present

## 2023-07-08 MED ORDER — ALBUTEROL SULFATE HFA 108 (90 BASE) MCG/ACT IN AERS: 1.0000 | INHALATION_SPRAY | Freq: Four times a day (QID) | RESPIRATORY_TRACT | 0 refills | Status: DC | PRN

## 2023-07-08 MED ORDER — PREDNISONE 20 MG PO TABS: ORAL_TABLET | ORAL | 0 refills | Status: DC

## 2023-07-08 MED ORDER — PROMETHAZINE-DM 6.25-15 MG/5ML PO SYRP: 5.0000 mL | ORAL_SOLUTION | Freq: Three times a day (TID) | ORAL | 0 refills | Status: DC | PRN

## 2023-07-08 MED ORDER — CETIRIZINE HCL 10 MG PO TABS: 10.0000 mg | ORAL_TABLET | Freq: Every day | ORAL | 0 refills | Status: DC

## 2023-07-08 NOTE — ED Provider Notes (Signed)
 [facility]  Note:  This document was prepared using Conservation officer, historic buildings and may include unintentional dictation errors.  MRN: [id number] DOB: Nov 01, 1995  Subjective:   Jodi Norman is a 28 y.o. female presenting for 1 week history of persistent throat congestion, coughing spells, sinus drainage, difficulty sleeping.  No chest pain, shortness of breath or wheezing.  Has sarcoidosis but not asthma.  No smoking of any kind including cigarettes, cigars, vaping, marijuana use.  Patient declines x-ray as we do not have an onsite.  No current facility-administered medications for this encounter.  Current Outpatient Medications:    cefdinir  (OMNICEF ) 300 MG capsule, Take 1 capsule (300 mg total) by mouth 2 (two) times daily., Disp: 10 capsule, Rfl: 0   cyclobenzaprine  (FLEXERIL ) 10 MG tablet, Take 1 tablet (10 mg total) by mouth 3 (three) times daily as needed for muscle spasms., Disp: 30 tablet, Rfl: 0   fluconazole  (DIFLUCAN ) 150 MG tablet, Take 1 tablet (150 mg total) by mouth daily., Disp: 1 tablet, Rfl: 0   FLUoxetine  (PROZAC ) 40 MG capsule, TAKE 1 CAPSULE (40 MG TOTAL) BY MOUTH DAILY., Disp: 90 capsule, Rfl: 2   Iron , Ferrous Sulfate , 325 (65 Fe) MG TABS, Take 325 mg by mouth daily., Disp: 30 tablet, Rfl: 5   meloxicam  (MOBIC ) 15 MG tablet, TAKE 1 TABLET (15 MG TOTAL) BY MOUTH DAILY., Disp: 30 tablet, Rfl: 0   Multiple Vitamin (MULTIVITAMIN) capsule, Take 1 capsule by mouth daily., Disp: , Rfl:    naproxen  (NAPROSYN ) 375 MG tablet, Take 1 tablet (375 mg total) by mouth 2 (two) times daily as needed., Disp: 14 tablet, Rfl: 0   ondansetron  (ZOFRAN ) 4 MG tablet, Take 1 tablet (4 mg total) by mouth every 8 (eight) hours as needed for nausea or vomiting., Disp: 20 tablet, Rfl: 0   valACYclovir  (VALTREX ) 1000 MG tablet, 2 TABLETS X1 AND REPEAT IN 12 HOURS, Disp: 180 tablet, Rfl: 1   Vitamin D , Ergocalciferol , (DRISDOL ) 1.25 MG (50000 UNIT) CAPS capsule, Take  1 capsule (50,000 Units total) by mouth every 7 (seven) days., Disp: 12 capsule, Rfl: 0   No Known Allergies  Past Medical History:  Diagnosis Date   Generalized anxiety disorder 06/24/2020   GERD (gastroesophageal reflux disease)    Sarcoidosis      Past Surgical History:  Procedure Laterality Date   CESAREAN SECTION     MOUTH SURGERY  05/2021   UPPER GI ENDOSCOPY  04/27/2021    Family History  Problem Relation Age of Onset   Hyperlipidemia Mother    Hypertension Mother    Hyperlipidemia Father    Hypertension Father    Diabetes Father    Colon cancer Neg Hx    Stomach cancer Neg Hx    Esophageal cancer Neg Hx    Pancreatic cancer Neg Hx    Colon polyps Neg Hx    Rectal cancer Neg Hx     Social History   Tobacco Use   Smoking status: Never   Smokeless tobacco: Never  Vaping Use   Vaping status: Never Used  Substance Use Topics   Alcohol use: Never   Drug use: Never    ROS   Objective:   Vitals: BP 122/79 (BP Location: Left Arm)   Pulse 82   Temp 98.8 F (37.1 C) (Oral)   Resp 19   LMP 06/24/2023 (Exact Date)   SpO2 98%   Physical Exam Constitutional:      General: She is  not in acute distress.    Appearance: Normal appearance. She is well-developed and normal weight. She is not ill-appearing, toxic-appearing or diaphoretic.  HENT:     Head: Normocephalic and atraumatic.     Right Ear: Tympanic membrane, ear canal and external ear normal. No drainage or tenderness. No middle ear effusion. There is no impacted cerumen. Tympanic membrane is not erythematous or bulging.     Left Ear: Tympanic membrane, ear canal and external ear normal. No drainage or tenderness.  No middle ear effusion. There is no impacted cerumen. Tympanic membrane is not erythematous or bulging.     Nose: Nose normal. No congestion or rhinorrhea.     Mouth/Throat:     Mouth: Mucous membranes are moist. No oral lesions.     Pharynx: No pharyngeal swelling, oropharyngeal exudate,  posterior oropharyngeal erythema or uvula swelling.     Tonsils: No tonsillar exudate or tonsillar abscesses.     Comments: Thick streaks of postnasal drainage overlying pharynx. Eyes:     General: No scleral icterus.       Right eye: No discharge.        Left eye: No discharge.     Extraocular Movements: Extraocular movements intact.     Right eye: Normal extraocular motion.     Left eye: Normal extraocular motion.     Conjunctiva/sclera: Conjunctivae normal.  Cardiovascular:     Rate and Rhythm: Normal rate and regular rhythm.     Heart sounds: Normal heart sounds. No murmur heard.    No friction rub. No gallop.  Pulmonary:     Effort: Pulmonary effort is normal. No respiratory distress.     Breath sounds: No stridor. Rhonchi present. No wheezing or rales.  Chest:     Chest wall: No tenderness.  Musculoskeletal:     Cervical back: Normal range of motion and neck supple.  Lymphadenopathy:     Cervical: No cervical adenopathy.  Skin:    General: Skin is warm and dry.  Neurological:     General: No focal deficit present.     Mental Status: She is alert and oriented to person, place, and time.  Psychiatric:        Mood and Affect: Mood normal.        Behavior: Behavior normal.     Assessment and Plan :   PDMP not reviewed this encounter.  1. Sinobronchitis    Patient declined imaging given that we do not have a rad tech onsite.  Will uphold antibiotic stewardship as she just had a course of antibiotics in cefdinir  a month ago.  Recommend prednisone  for her significant bronchospasms, suspected sinobronchitis.  Use albuterol  and supportive care otherwise. Counseled patient on potential for adverse effects with medications prescribed/recommended today, ER and return-to-clinic precautions discussed, patient verbalized understanding.    Adolph Hoop, PA-C 07/08/23 1029

## 2023-07-08 NOTE — ED Triage Notes (Signed)
 Pt is here with a cough that started Tuesday, pt has taken OTC meds to relieve discomfort.

## 2023-07-09 ENCOUNTER — Other Ambulatory Visit: Payer: Self-pay | Admitting: *Deleted

## 2023-07-09 DIAGNOSIS — D508 Other iron deficiency anemias: Secondary | ICD-10-CM

## 2023-07-10 ENCOUNTER — Inpatient Hospital Stay: Payer: BC Managed Care – PPO | Attending: Family Medicine

## 2023-07-10 ENCOUNTER — Inpatient Hospital Stay: Payer: Self-pay | Admitting: Family

## 2023-08-10 ENCOUNTER — Encounter (HOSPITAL_BASED_OUTPATIENT_CLINIC_OR_DEPARTMENT_OTHER): Payer: Self-pay | Admitting: Emergency Medicine

## 2023-08-10 ENCOUNTER — Other Ambulatory Visit: Payer: Self-pay

## 2023-08-10 DIAGNOSIS — R200 Anesthesia of skin: Secondary | ICD-10-CM | POA: Diagnosis not present

## 2023-08-10 DIAGNOSIS — R202 Paresthesia of skin: Secondary | ICD-10-CM | POA: Diagnosis not present

## 2023-08-10 DIAGNOSIS — D649 Anemia, unspecified: Secondary | ICD-10-CM | POA: Insufficient documentation

## 2023-08-10 DIAGNOSIS — R519 Headache, unspecified: Secondary | ICD-10-CM | POA: Diagnosis not present

## 2023-08-10 DIAGNOSIS — Z79899 Other long term (current) drug therapy: Secondary | ICD-10-CM | POA: Diagnosis not present

## 2023-08-10 DIAGNOSIS — R7309 Other abnormal glucose: Secondary | ICD-10-CM | POA: Insufficient documentation

## 2023-08-10 DIAGNOSIS — R9431 Abnormal electrocardiogram [ECG] [EKG]: Secondary | ICD-10-CM | POA: Diagnosis not present

## 2023-08-10 NOTE — ED Triage Notes (Signed)
 Pt states she was lying down @ 2145 and started having tingling down the left side including her arm and leg, still feels some tingling in triage; denies CP, ShoB, HA, dizziness, n/v or other symptoms  A&O x 4, strength & sensation equal in BLE/BLU extremities, neuro assessment WNL, no facial drooping noted

## 2023-08-11 ENCOUNTER — Emergency Department (HOSPITAL_BASED_OUTPATIENT_CLINIC_OR_DEPARTMENT_OTHER)
Admission: EM | Admit: 2023-08-11 | Discharge: 2023-08-11 | Disposition: A | Discharge status: Home / Self Care | Attending: Emergency Medicine | Admitting: Emergency Medicine

## 2023-08-11 DIAGNOSIS — D649 Anemia, unspecified: Secondary | ICD-10-CM

## 2023-08-11 DIAGNOSIS — R739 Hyperglycemia, unspecified: Secondary | ICD-10-CM

## 2023-08-11 DIAGNOSIS — R202 Paresthesia of skin: Secondary | ICD-10-CM

## 2023-08-11 LAB — CBC WITH DIFFERENTIAL/PLATELET
Abs Immature Granulocytes: 0.02 10*3/uL (ref 0.00–0.07)
Basophils Absolute: 0 10*3/uL (ref 0.0–0.1)
Basophils Relative: 1 %
Eosinophils Absolute: 0.1 10*3/uL (ref 0.0–0.5)
Eosinophils Relative: 1 %
HCT: 32.6 % — ABNORMAL LOW (ref 36.0–46.0)
Hemoglobin: 11 g/dL — ABNORMAL LOW (ref 12.0–15.0)
Immature Granulocytes: 0 %
Lymphocytes Relative: 45 %
Lymphs Abs: 2.9 10*3/uL (ref 0.7–4.0)
MCH: 28.1 pg (ref 26.0–34.0)
MCHC: 33.7 g/dL (ref 30.0–36.0)
MCV: 83.2 fL (ref 80.0–100.0)
Monocytes Absolute: 0.5 10*3/uL (ref 0.1–1.0)
Monocytes Relative: 7 %
Neutro Abs: 3 10*3/uL (ref 1.7–7.7)
Neutrophils Relative %: 46 %
Platelets: 271 10*3/uL (ref 150–400)
RBC: 3.92 MIL/uL (ref 3.87–5.11)
RDW: 13.9 % (ref 11.5–15.5)
WBC: 6.5 10*3/uL (ref 4.0–10.5)
nRBC: 0 % (ref 0.0–0.2)

## 2023-08-11 LAB — COMPREHENSIVE METABOLIC PANEL
ALT: 12 U/L (ref 0–44)
AST: 17 U/L (ref 15–41)
Albumin: 3.7 g/dL (ref 3.5–5.0)
Alkaline Phosphatase: 78 U/L (ref 38–126)
Anion gap: 8 (ref 5–15)
BUN: 11 mg/dL (ref 6–20)
CO2: 24 mmol/L (ref 22–32)
Calcium: 9 mg/dL (ref 8.9–10.3)
Chloride: 105 mmol/L (ref 98–111)
Creatinine, Ser: 0.61 mg/dL (ref 0.44–1.00)
GFR, Estimated: >60 mL/min (ref >60)
Glucose, Bld: 106 mg/dL — ABNORMAL HIGH (ref 70–99)
Potassium: 3.8 mmol/L (ref 3.5–5.1)
Sodium: 137 mmol/L (ref 135–145)
Total Bilirubin: 0.3 mg/dL (ref 0.0–1.2)
Total Protein: 7.7 g/dL (ref 6.5–8.1)

## 2023-08-11 LAB — MAGNESIUM: Magnesium: 1.8 mg/dL (ref 1.7–2.4)

## 2023-08-11 NOTE — ED Provider Notes (Signed)
 [place] [facility] AT [facility] Provider Note   CSN: [id number] Arrival date & time: 08/10/23  2206     History  Chief complaint: Numbness  Jodi Norman is a 28 y.o. female.  The history is provided by the patient.  She has history of GERD, sarcoidosis, anxiety and comes in complaining of numbness in her left leg which started this evening.  She frequently has numbness in her arms and legs, but she states that this was different and that the entire leg felt numb.  Symptoms are improving.  She denies any weakness.  She does have a mild occipital headache, which is something that she has frequently.  She denies any weakness.  She has been referred to neurology in the past, but states that she has had difficulty getting appointments.   Home Medications Prior to Admission medications   Medication Sig Start Date End Date Taking? Authorizing Provider  albuterol (VENTOLIN HFA) 108 (90 Base) MCG/ACT inhaler Inhale 1-2 puffs into the lungs every 6 (six) hours as needed for wheezing or shortness of breath. 07/08/23   Wallis Bamberg, PA-C  cefdinir (OMNICEF) 300 MG capsule Take 1 capsule (300 mg total) by mouth 2 (two) times daily. 05/03/23   Donato Schultz, DO  cetirizine (ZYRTEC ALLERGY) 10 MG tablet Take 1 tablet (10 mg total) by mouth daily. 07/08/23   Wallis Bamberg, PA-C  cyclobenzaprine (FLEXERIL) 10 MG tablet Take 1 tablet (10 mg total) by mouth 3 (three) times daily as needed for muscle spasms. 06/11/23   Donato Schultz, DO  fluconazole (DIFLUCAN) 150 MG tablet Take 1 tablet (150 mg total) by mouth daily. 05/06/23   Radford Pax, NP  FLUoxetine (PROZAC) 40 MG capsule TAKE 1 CAPSULE (40 MG TOTAL) BY MOUTH DAILY. 06/10/23   Donato Schultz, DO  Iron, Ferrous Sulfate, 325 (65 Fe) MG TABS Take 325 mg by mouth daily. 01/08/23   Donato Schultz, DO  Multiple Vitamin (MULTIVITAMIN) capsule Take 1 capsule by mouth daily.    [provider]   naproxen (NAPROSYN) 375 MG tablet Take 1 tablet (375 mg total) by mouth 2 (two) times daily as needed. 05/06/23   Radford Pax, NP  ondansetron (ZOFRAN) 4 MG tablet Take 1 tablet (4 mg total) by mouth every 8 (eight) hours as needed for nausea or vomiting. 01/08/23   Zola Button, Grayling Congress, DO  predniSONE (DELTASONE) 20 MG tablet Take 2 tablets daily with breakfast. 07/08/23   Wallis Bamberg, PA-C  promethazine-dextromethorphan (PROMETHAZINE-DM) 6.25-15 MG/5ML syrup Take 5 mLs by mouth 3 (three) times daily as needed for cough. 07/08/23   Wallis Bamberg, PA-C  valACYclovir (VALTREX) 1000 MG tablet 2 TABLETS X1 AND REPEAT IN 12 HOURS 06/10/23   Zola Button, Grayling Congress, DO  Vitamin D, Ergocalciferol, (DRISDOL) 1.25 MG (50000 UNIT) CAPS capsule Take 1 capsule (50,000 Units total) by mouth every 7 (seven) days. 07/20/22   Sharlene Dory, DO      Allergies    Patient has no known allergies.    Review of Systems   Review of Systems  All other systems reviewed and are negative.   Physical Exam Updated Vital Signs BP (!) 109/55   Pulse 83   Temp 99.1 F (37.3 C)   Resp 16   Ht 5\' 6"  (1.676 m)   Wt 106.6 kg   SpO2 100%   BMI 37.93 kg/m  Physical Exam Vitals and nursing note reviewed.  28 year old female, resting comfortably and in no acute distress. Vital signs are normal. Oxygen saturation is 100%, which is normal. Head is normocephalic and atraumatic. PERRLA, EOMI. Oropharynx is clear. Neck is nontender and supple . Lungs are clear without rales, wheezes, or rhonchi. Chest is nontender. Heart has regular rate and rhythm without murmur. Abdomen is soft, flat, nontender. Extremities have no cyanosis or edema, full range of motion is present.  Dorsalis pedis pulses are strong bilaterally. Skin is warm and dry without rash. Neurologic: Awake and alert and oriented, cranial nerves are intact.  Strength is 5/5 in all 4 extremities.  There is no pronator drift.  Sensation is intact  throughout.  There is no extinction on double simultaneous stimulation.  Coordination is normal.  ED Results / Procedures / Treatments   Labs (all labs ordered are listed, but only abnormal results are displayed) Labs Reviewed  COMPREHENSIVE METABOLIC PANEL - Abnormal; Notable for the following components:      Result Value   Glucose, Bld 106 (*)    All other components within normal limits  CBC WITH DIFFERENTIAL/PLATELET - Abnormal; Notable for the following components:   Hemoglobin 11.0 (*)    HCT 32.6 (*)    All other components within normal limits  MAGNESIUM    EKG EKG Interpretation Date/Time:  Saturday August 10 2023 22:14:08 EDT Ventricular Rate:  99 PR Interval:  126 QRS Duration:  86 QT Interval:  330 QTC Calculation: 424 R Axis:   46  Text Interpretation: Sinus rhythm Normal ECG When compared with ECG of 06/04/2023, Nonspecific T wave abnormality is no longer present Confirmed by Dione Booze ([id number]) on 08/10/2023 10:54:03 PM  Procedures Procedures    Medications Ordered in ED Medications - No data to display  ED Course/ Medical Decision Making/ A&P                                 Medical Decision Making Amount and/or Complexity of Data Reviewed Labs: ordered.   Subjective paresthesias, no evidence of stroke.  I suspect symptoms may be related to her sarcoidosis, consider coexistent multiple sclerosis.  I have reviewed her past records, and she had an ED visit on 06/04/2023 at which time CT of head was normal and she was referred to [facility].  I reviewed her labs on that visit, and note that her hemoglobin had dropped significantly compared with recent hemoglobins.  I have ordered repeat laboratory testing of CBC, comprehensive metabolic panel, magnesium level.  I have reviewed her laboratory test, my interpretation is mildly elevated random glucose level which will need to be evaluated as an outpatient, and mild anemia which is significantly improved  compared with 06/04/2023.  I am discharging her with instructions to follow-up with neurology for thorough neurologic evaluation.  Final Clinical Impression(s) / ED Diagnoses Final diagnoses:  Paresthesias  Elevated random blood glucose level  Normochromic normocytic anemia    Rx / DC Orders ED Discharge Orders     None         Dione Booze, MD 08/11/23 [phone]

## 2023-08-11 NOTE — Discharge Instructions (Addendum)
 Your evaluation did not show any serious problems.  Specifically, there is no evidence of stroke.  However, it is important that you follow-up with a neurologist to see if symptoms may be related to your underlying sarcoidosis or possibly another neurologic problem.

## 2023-09-01 ENCOUNTER — Other Ambulatory Visit: Payer: Self-pay | Admitting: Family Medicine

## 2023-09-04 DIAGNOSIS — Z124 Encounter for screening for malignant neoplasm of cervix: Secondary | ICD-10-CM | POA: Diagnosis not present

## 2023-09-04 DIAGNOSIS — Z01419 Encounter for gynecological examination (general) (routine) without abnormal findings: Secondary | ICD-10-CM | POA: Diagnosis not present

## 2023-09-04 DIAGNOSIS — N760 Acute vaginitis: Secondary | ICD-10-CM | POA: Diagnosis not present

## 2023-09-04 DIAGNOSIS — Z113 Encounter for screening for infections with a predominantly sexual mode of transmission: Secondary | ICD-10-CM | POA: Diagnosis not present

## 2023-09-13 ENCOUNTER — Emergency Department (HOSPITAL_COMMUNITY)
Admission: EM | Admit: 2023-09-13 | Discharge: 2023-09-13 | Disposition: A | Discharge status: Home / Self Care | Attending: Emergency Medicine | Admitting: Emergency Medicine

## 2023-09-13 ENCOUNTER — Other Ambulatory Visit: Payer: Self-pay

## 2023-09-13 ENCOUNTER — Emergency Department (HOSPITAL_COMMUNITY)

## 2023-09-13 ENCOUNTER — Encounter (HOSPITAL_COMMUNITY): Payer: Self-pay | Admitting: *Deleted

## 2023-09-13 DIAGNOSIS — R269 Unspecified abnormalities of gait and mobility: Secondary | ICD-10-CM | POA: Diagnosis not present

## 2023-09-13 DIAGNOSIS — R200 Anesthesia of skin: Secondary | ICD-10-CM | POA: Diagnosis not present

## 2023-09-13 DIAGNOSIS — M79604 Pain in right leg: Secondary | ICD-10-CM | POA: Insufficient documentation

## 2023-09-13 DIAGNOSIS — M79605 Pain in left leg: Secondary | ICD-10-CM | POA: Diagnosis not present

## 2023-09-13 DIAGNOSIS — M79671 Pain in right foot: Secondary | ICD-10-CM | POA: Diagnosis not present

## 2023-09-13 MED ORDER — ENOXAPARIN SODIUM 100 MG/ML IJ SOSY
100.0000 mg | PREFILLED_SYRINGE | Freq: Once | INTRAMUSCULAR | Status: AC
  Administered 2023-09-13: 100 mg via SUBCUTANEOUS
  Filled 2023-09-13: qty 1

## 2023-09-13 MED ORDER — LIDOCAINE 5 % EX PTCH
1.0000 | MEDICATED_PATCH | CUTANEOUS | Status: DC
  Administered 2023-09-13: 1 via TRANSDERMAL
  Filled 2023-09-13: qty 1

## 2023-09-13 MED ORDER — KETOROLAC TROMETHAMINE 30 MG/ML IJ SOLN
30.0000 mg | Freq: Once | INTRAMUSCULAR | Status: AC
Start: 2023-09-13 — End: 2023-09-13
  Administered 2023-09-13: 30 mg via INTRAMUSCULAR
  Filled 2023-09-13: qty 1

## 2023-09-13 MED ORDER — OXYCODONE-ACETAMINOPHEN 5-325 MG PO TABS: 1.0000 | ORAL_TABLET | Freq: Four times a day (QID) | ORAL | 0 refills | Status: DC | PRN

## 2023-09-13 MED ORDER — MELOXICAM 7.5 MG PO TABS: 7.5000 mg | ORAL_TABLET | Freq: Every day | ORAL | 0 refills | Status: DC

## 2023-09-13 MED ORDER — LIDOCAINE 5 % EX PTCH: 1.0000 | MEDICATED_PATCH | CUTANEOUS | 0 refills | Status: DC

## 2023-09-13 NOTE — Discharge Instructions (Addendum)
 I have placed an order for an ultrasound to be performed tomorrow.  You come to the front of the emergency department and let them know that you are here for an ultrasound outpatient.  If positive they will have you checked back and if negative please follow back up with your primary care provider  I have placed you on a pain medication do not drive or operate heavy machinery while taking this medication.  Make sure to elevate the leg.  Gentle stretching exercises at home.  I have also placed you on anti-inflammatory called Mobic .  Do not take any additional ibuprofen  containing products or anti-inflammatories while taking this medication.  You may take Tylenol  at home.  I have also written for lidocaine  patches to help.  Make sure to follow-up with your primary care provider, return for any worsening symptoms.  If you develop severe chest pain, shortness of breath, coughing up blood please seek reevaluation

## 2023-09-13 NOTE — ED Triage Notes (Signed)
 Pt is here for right foot pain which began yesterday but has not been associated with any trauma and also lower back pain both of which pt attributes to her long hours sitting at work.

## 2023-09-13 NOTE — Progress Notes (Signed)
 PHARMACY - ANTICOAGULATION CONSULT NOTE  Pharmacy Consult for Lovenox  Indication: Possible DVT  No Known Allergies  Patient Measurements: Height: 5\' 6"  (167.6 cm) Weight: 108 kg (238 lb) IBW/kg (Calculated) : 59.3 HEPARIN DW (KG): 84.3  Vital Signs: Temp: 98.5 F (36.9 C) (04/18 1715) BP: 133/84 (04/18 2141) Pulse Rate: 86 (04/18 2141)  Labs: No results for input(s): "HGB", "HCT", "PLT", "APTT", "LABPROT", "INR", "HEPARINUNFRC", "HEPRLOWMOCWT", "CREATININE", "CKTOTAL", "CKMB", "TROPONINIHS" in the last 72 hours.  CrCl cannot be calculated (Patient's most recent lab result is older than the maximum 21 days allowed.).   Medical History: Past Medical History:  Diagnosis Date   Generalized anxiety disorder 06/24/2020   GERD (gastroesophageal reflux disease)    Sarcoidosis     Medications:  (Not in a hospital admission)  Scheduled:   ketorolac   30 mg Intramuscular Once   lidocaine   1 patch Transdermal Q24H    Assessment: 57 YOF presented with concern for a possible DVT. Per PA-C, would like to administer a one-time dose of anti-coagulant until the ultrasound can be completed tomorrow morning. Past medical history significant for sarcoidosis. Patient not on any anticoagulation prior to arrival. Pharmacy consulted for a single dose of enoxaparin .   Goal of Therapy:  Anti-Xa level 0.6-1 units/ml 4hrs after LMWH dose given Monitor platelets by anticoagulation protocol: Yes   Plan:  Lovenox  100 mg subcutaneous x1 F/u within 12 hours to determine need for further Lovenox  doses Monitor CBC and s/sx of bleeding daily while on Lovenox  Monitor anti-Xa level as appropriate  Volney Grumbles, PharmD PGY-1 Acute Care Pharmacy Resident 09/13/2023 9:54 PM

## 2023-09-13 NOTE — ED Notes (Addendum)
 Discharge instructions reviewed.   Newly prescribed medications discussed. Pharmacy verified.   Opportunity for questions and concerns provided.   Alert, oriented and escorted out in wheelchair by staff. Pt has crutches and proper demonstration provided.   Displays no signs of distress.

## 2023-09-13 NOTE — Progress Notes (Signed)
 Orthopedic Tech Progress Note Patient Details:  Jodi Norman May 11, 1996 [id number]  Ortho Devices Type of Ortho Device: Crutches Ortho Device/Splint Interventions: Ordered, Application  Pt reports living on the second level of her apartment.  Instructions provided for navigating up and down stairs however recommend sliding up on her bottom as she does not have any assist.   She voices understanding of multiple ways to navigate the steps Post Interventions Patient Tolerated: Well Instructions Provided: Poper ambulation with device  [name] OTR/L 09/13/2023, 10:34 PM

## 2023-09-13 NOTE — ED Provider Notes (Signed)
 [place] [facility] AT [facility] Provider Note   CSN: [id number] Arrival date & time: 09/13/23  1648    History  Chief Complaint  Patient presents with   Foot Pain    Jodi Norman is a 28 y.o. female here for evaluation of right lower extremity pain.  Starts at her right gluteus and extends down into her right lower extremity into her right great toe.  Hurts with movement.  She has a history of sarcoidosis and was told she has a high risk of blood clots.  She feels like her leg is swollen.  She is some intermittent numbness however has history of similar.  No bowel or bladder incontinence, saddle paresthesia, history of IVDU.  No chest pain, shortness of breath.  She has noted that she has worked the last 7 days and has been sitting essentially the entire time.  No recent falls or injuries.  No redness, warmth, ecchymosis to extremities.  Denies chance of pregnancy  HPI     Home Medications Prior to Admission medications   Medication Sig Start Date End Date Taking? Authorizing Provider  lidocaine  (LIDODERM ) 5 % Place 1 patch onto the skin daily. Remove & Discard patch within 12 hours or as directed by MD 09/13/23  Yes Lothar Prehn A, PA-C  meloxicam  (MOBIC ) 7.5 MG tablet Take 1 tablet (7.5 mg total) by mouth daily. 09/13/23  Yes Reade Trefz A, PA-C  oxyCODONE -acetaminophen  (PERCOCET/ROXICET) 5-325 MG tablet Take 1 tablet by mouth every 6 (six) hours as needed for severe pain (pain score 7-10). 09/13/23  Yes Masaji Billups A, PA-C  albuterol  (VENTOLIN  HFA) 108 (90 Base) MCG/ACT inhaler Inhale 1-2 puffs into the lungs every 6 (six) hours as needed for wheezing or shortness of breath. 07/08/23   Adolph Hoop, PA-C  cefdinir  (OMNICEF ) 300 MG capsule Take 1 capsule (300 mg total) by mouth 2 (two) times daily. 05/03/23   Estill Hemming, DO  cetirizine  (ZYRTEC  ALLERGY) 10 MG tablet Take 1 tablet (10 mg total) by mouth daily. 07/08/23   Adolph Hoop, PA-C   cyclobenzaprine  (FLEXERIL ) 10 MG tablet Take 1 tablet (10 mg total) by mouth 3 (three) times daily as needed for muscle spasms. 06/11/23   Estill Hemming, DO  fluconazole  (DIFLUCAN ) 150 MG tablet Take 1 tablet (150 mg total) by mouth daily. 05/06/23   Mayer, Jodi R, NP  FLUoxetine  (PROZAC ) 40 MG capsule TAKE 1 CAPSULE (40 MG TOTAL) BY MOUTH DAILY. 06/10/23   Roel Clarity R, DO  Iron , Ferrous Sulfate , 325 (65 Fe) MG TABS Take 325 mg by mouth daily. 01/08/23   Lowne Chase, Yvonne R, DO  Multiple Vitamin (MULTIVITAMIN) capsule Take 1 capsule by mouth daily.    [provider]  naproxen  (NAPROSYN ) 375 MG tablet Take 1 tablet (375 mg total) by mouth 2 (two) times daily as needed. 05/06/23   Mayer, Jodi R, NP  ondansetron  (ZOFRAN ) 4 MG tablet Take 1 tablet (4 mg total) by mouth every 8 (eight) hours as needed for nausea or vomiting. 01/08/23   Crecencio Dodge, Candida Chalk, DO  predniSONE  (DELTASONE ) 20 MG tablet Take 2 tablets daily with breakfast. 07/08/23   Adolph Hoop, PA-C  promethazine -dextromethorphan  (PROMETHAZINE -DM) 6.25-15 MG/5ML syrup Take 5 mLs by mouth 3 (three) times daily as needed for cough. 07/08/23   Adolph Hoop, PA-C  valACYclovir  (VALTREX ) 1000 MG tablet 2 TABLETS X1 AND REPEAT IN 12 HOURS 06/10/23   Lowne Chase, Yvonne R, DO  Vitamin D , Ergocalciferol , (DRISDOL ) 1.25 MG (50000 UNIT) CAPS capsule TAKE 1 CAPSULE (50,000 UNITS TOTAL) BY MOUTH EVERY 7 (SEVEN) DAYS 09/03/23   Lowne Chase, Yvonne R, DO      Allergies    Patient has no known allergies.    Review of Systems   Review of Systems  Constitutional: Negative.   HENT: Negative.    Respiratory: Negative.    Cardiovascular: Negative.   Gastrointestinal: Negative.   Genitourinary: Negative.   Musculoskeletal:  Positive for back pain.       Right lower back pain, right leg pain  Skin: Negative.   Neurological: Negative.   All other systems reviewed and are negative.   Physical Exam Updated Vital Signs BP 133/84  (BP Location: Right Arm)   Pulse 86   Temp 98.5 F (36.9 C)   Resp 16   Ht 5\' 6"  (1.676 m)   Wt 108 kg   SpO2 100%   BMI 38.41 kg/m  Physical Exam Vitals and nursing note reviewed.  Constitutional:      General: She is not in acute distress.    Appearance: She is well-developed. She is not ill-appearing, toxic-appearing or diaphoretic.  HENT:     Head: Atraumatic.  Eyes:     Pupils: Pupils are equal, round, and reactive to light.  Cardiovascular:     Rate and Rhythm: Normal rate.     Pulses: Normal pulses.          Radial pulses are 2+ on the right side and 2+ on the left side.       Dorsalis pedis pulses are 2+ on the right side.       Posterior tibial pulses are 2+ on the right side.     Heart sounds: Normal heart sounds.  Pulmonary:     Effort: Pulmonary effort is normal. No respiratory distress.     Breath sounds: Normal breath sounds.  Abdominal:     General: Bowel sounds are normal. There is no distension.     Palpations: Abdomen is soft.     Tenderness: There is no abdominal tenderness. There is no guarding or rebound.  Musculoskeletal:        General: Normal range of motion.     Cervical back: Normal range of motion.     Comments: Diffuse tenderness right lower extremity.  No midline C/T/L tenderness.  Tenderness over right piriformis and into right gluteus, lateral aspect right thigh into her right medial calf.  Negative straight leg raise.  Compartments are soft.  2 Plus DP, PT pulses  Skin:    General: Skin is warm and dry.     Capillary Refill: Capillary refill takes less than 2 seconds.     Comments: Soft tissue swelling right foot, no erythema, warmth, crepitus.  Full range of motion.  Pain with plantarflexion and dorsiflexion right foot  Neurological:     General: No focal deficit present.     Mental Status: She is alert.     Cranial Nerves: No cranial nerve deficit.     Motor: No weakness.     Gait: Gait abnormal.  Psychiatric:        Mood and Affect:  Mood normal.     ED Results / Procedures / Treatments   Labs (all labs ordered are listed, but only abnormal results are displayed) Labs Reviewed - No data to display  EKG None  Radiology DG Foot Complete Right Result Date: 09/13/2023 CLINICAL DATA:  Pain. EXAM: RIGHT FOOT  COMPLETE - 3 VIEW COMPARISON:  None Available. FINDINGS: There is no evidence of fracture or dislocation. There is no evidence of arthropathy or other focal bone abnormality. Soft tissues are unremarkable. IMPRESSION: Negative. Electronically Signed   By: Sydell Eva M.D.   On: 09/13/2023 20:20    Procedures Procedures    Medications Ordered in ED Medications  lidocaine  (LIDODERM ) 5 % 1 patch (1 patch Transdermal Patch Applied 09/13/23 2240)  ketorolac  (TORADOL ) 30 MG/ML injection 30 mg (30 mg Intramuscular Given 09/13/23 2241)  enoxaparin  (LOVENOX ) injection 100 mg (100 mg Subcutaneous Given 09/13/23 2240)    ED Course/ Medical Decision Making/ A&P   28 year old here for evaluation of right lower extremity pain.  She is afebrile, nonseptic, not ill-appearing.  No CP, shortness of breath.  She has pain from her right thigh into her right lower foot.  She feels like her leg is swollen.  She is concerned for DVT as she has a history of sarcoid and was told she is at high risk for blood clots.  She has no erythema or warmth.  Negative straight leg raise.  Her pains possibly more sciatic, piriformis that starts in her right gluteus into her leg.  She has 2+ DP, PT pulses.  No bony tenderness.  She is at higher risk for DVT.  Will give dose of Lovenox  here, unfortunately ultrasound not available, will have performed tomorrow morning.  Will treat for radiculopathy at this time.  No red flags for back pain.  No ischemic changes.  Exam not concerning for infectious process.  Low suspicion for PE.  She denies chance of pregnancy.  She did receive an x-ray of her right foot from triage which did not show any significant  abnormality.  Again while she does have some pain to her right foot this seems to be more right lower extremity pain entire leg.  Low suspicion for ischemic injury, infectious process, fracture, dislocation, septic joint, gout, hemarthrosis, cauda equina, discitis, osteomyelitis, transverse myelitis, abscess, necrotizing infection  The patient has been appropriately medically screened and/or stabilized in the ED. I have low suspicion for any other emergent medical condition which would require further screening, evaluation or treatment in the ED or require inpatient management.  Patient is hemodynamically stable and in no acute distress.  Patient able to ambulate in department prior to ED.  Evaluation does not show acute pathology that would require ongoing or additional emergent interventions while in the emergency department or further inpatient treatment.  I have discussed the diagnosis with the patient and answered all questions.  Pain is been managed while in the emergency department and patient has no further complaints prior to discharge.  Patient is comfortable with plan discussed in room and is stable for discharge at this time.  I have discussed strict return precautions for returning to the emergency department.  Patient was encouraged to follow-up with PCP/specialist refer to at discharge.                                Medical Decision Making Amount and/or Complexity of Data Reviewed Radiology: ordered and independent interpretation performed. Decision-making details documented in ED Course.  Risk Prescription drug management.         Final Clinical Impression(s) / ED Diagnoses Final diagnoses:  Right leg pain    Rx / DC Orders ED Discharge Orders          Ordered  LE VENOUS        09/13/23 2123    oxyCODONE -acetaminophen  (PERCOCET/ROXICET) 5-325 MG tablet  Every 6 hours PRN        09/13/23 2246    meloxicam  (MOBIC ) 7.5 MG tablet  Daily        09/13/23 2246     lidocaine  (LIDODERM ) 5 %  Every 24 hours        09/13/23 2246              Gwyndolyn Guilford A, PA-C 09/13/23 2256    Onetha Bile, MD 09/19/23 1502

## 2023-09-14 ENCOUNTER — Ambulatory Visit (HOSPITAL_COMMUNITY)
Admission: RE | Admit: 2023-09-14 | Discharge: 2023-09-14 | Disposition: A | Discharge status: Home / Self Care | Source: Ambulatory Visit | Attending: Physician Assistant | Admitting: Physician Assistant

## 2023-09-14 DIAGNOSIS — M79604 Pain in right leg: Secondary | ICD-10-CM | POA: Diagnosis not present

## 2023-09-14 DIAGNOSIS — M5431 Sciatica, right side: Secondary | ICD-10-CM | POA: Insufficient documentation

## 2023-09-14 DIAGNOSIS — M79671 Pain in right foot: Secondary | ICD-10-CM | POA: Diagnosis not present

## 2023-09-14 DIAGNOSIS — M7989 Other specified soft tissue disorders: Secondary | ICD-10-CM

## 2023-09-14 NOTE — Progress Notes (Signed)
 VASCULAR LAB    Right lower extremity venous duplex has been performed.  See CV proc for preliminary results.   Tiye Huwe, RVT 09/14/2023, 12:50 PM

## 2023-09-16 ENCOUNTER — Encounter: Payer: Self-pay | Admitting: Family Medicine

## 2023-09-16 ENCOUNTER — Other Ambulatory Visit: Payer: Self-pay | Admitting: Family Medicine

## 2023-09-16 DIAGNOSIS — G8929 Other chronic pain: Secondary | ICD-10-CM

## 2023-09-16 NOTE — Addendum Note (Signed)
 Addended by: Roel Clarity R on: 09/16/2023 02:44 PM   Modules accepted: Orders

## 2023-09-18 ENCOUNTER — Ambulatory Visit (HOSPITAL_BASED_OUTPATIENT_CLINIC_OR_DEPARTMENT_OTHER): Admitting: Student

## 2023-09-18 DIAGNOSIS — M5441 Lumbago with sciatica, right side: Secondary | ICD-10-CM | POA: Diagnosis not present

## 2023-09-18 DIAGNOSIS — M545 Low back pain, unspecified: Secondary | ICD-10-CM | POA: Diagnosis not present

## 2023-09-18 DIAGNOSIS — M79671 Pain in right foot: Secondary | ICD-10-CM | POA: Diagnosis not present

## 2023-09-19 ENCOUNTER — Encounter: Payer: Self-pay | Admitting: Family Medicine

## 2023-09-20 ENCOUNTER — Ambulatory Visit: Admitting: Orthopaedic Surgery

## 2023-09-21 ENCOUNTER — Other Ambulatory Visit

## 2023-10-15 ENCOUNTER — Ambulatory Visit: Admitting: Family Medicine

## 2023-10-15 VITALS — BP 120/77 | HR 78 | Temp 99.2°F | Resp 12 | Ht 66.0 in | Wt 250.6 lb

## 2023-10-15 DIAGNOSIS — E559 Vitamin D deficiency, unspecified: Secondary | ICD-10-CM | POA: Diagnosis not present

## 2023-10-15 DIAGNOSIS — R319 Hematuria, unspecified: Secondary | ICD-10-CM | POA: Diagnosis not present

## 2023-10-15 DIAGNOSIS — D508 Other iron deficiency anemias: Secondary | ICD-10-CM

## 2023-10-15 DIAGNOSIS — R109 Unspecified abdominal pain: Secondary | ICD-10-CM

## 2023-10-15 NOTE — Patient Instructions (Signed)
 Flank Pain, Adult  Flank pain is pain that is located on the side of the body between the upper abdomen and the spine. This area is called the flank. The pain may occur over a short period of time (acute), or it may be long-term or recurring (chronic). It may be mild or severe. Flank pain can be caused by many things, including:  Muscle soreness or injury.  Kidney infection, kidney stones, or kidney disease.  Stress.  A disease of the spine (vertebral disk disease).  A lung infection (pneumonia).  Fluid around the lungs (pulmonary edema).  A skin rash caused by the chickenpox virus (shingles).  Tumors that affect the back of the abdomen.  Gallbladder disease.  Follow these instructions at home:    Drink enough fluid to keep your urine pale yellow.  Rest as told by your health care provider.  Take over-the-counter and prescription medicines only as told by your health care provider.  Keep a journal to track what has caused your flank pain and what has made it feel better.  Keep all follow-up visits. This is important.  Contact a health care provider if:  Your pain is not controlled with medicine.  You have new symptoms.  Your pain gets worse.  Your symptoms last longer than 2-3 days.  You have trouble urinating or you are urinating very frequently.  Get help right away if:  You have trouble breathing or you are short of breath.  Your abdomen hurts or it is swollen or red.  You have nausea or vomiting.  You feel faint, or you faint.  You have blood in your urine.  You have flank pain and a fever.  These symptoms may represent a serious problem that is an emergency. Do not wait to see if the symptoms will go away. Get medical help right away. Call your local emergency services (911 in the [place] Do not drive yourself to the hospital.  Summary  Flank pain is pain that is located on the side of the body between the upper abdomen and the spine.  The pain may occur over a short period of time (acute), or it may be  long-term or recurring (chronic). It may be mild or severe.  Flank pain can be caused by many things.  Contact your health care provider if your symptoms get worse or last longer than 2-3 days.  This information is not intended to replace advice given to you by your health care provider. Make sure you discuss any questions you have with your health care provider.  Document Revised: 07/25/2020 Document Reviewed: 07/25/2020  Elsevier Patient Education  2024 [employer].
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
## 2023-10-16 LAB — COMPREHENSIVE METABOLIC PANEL WITH GFR
ALT: 10 U/L (ref 0–35)
AST: 14 U/L (ref 0–37)
Albumin: 4.3 g/dL (ref 3.5–5.2)
Alkaline Phosphatase: 82 U/L (ref 39–117)
BUN: 10 mg/dL (ref 6–23)
CO2: 25 meq/L (ref 19–32)
Calcium: 9.2 mg/dL (ref 8.4–10.5)
Chloride: 103 meq/L (ref 96–112)
Creatinine, Ser: 0.67 mg/dL (ref 0.40–1.20)
GFR: 119.7 mL/min (ref >60.00)
Glucose, Bld: 91 mg/dL (ref 70–99)
Potassium: 4.2 meq/L (ref 3.5–5.1)
Sodium: 136 meq/L (ref 135–145)
Total Bilirubin: 0.2 mg/dL (ref 0.2–1.2)
Total Protein: 7.5 g/dL (ref 6.0–8.3)

## 2023-10-16 LAB — CBC WITH DIFFERENTIAL/PLATELET
Basophils Absolute: 0 10*3/uL (ref 0.0–0.1)
Basophils Relative: 0.7 % (ref 0.0–3.0)
Eosinophils Absolute: 0.1 10*3/uL (ref 0.0–0.7)
Eosinophils Relative: 1.8 % (ref 0.0–5.0)
HCT: 32.9 % — ABNORMAL LOW (ref 36.0–46.0)
Hemoglobin: 11.3 g/dL — ABNORMAL LOW (ref 12.0–15.0)
Lymphocytes Relative: 44.5 % (ref 12.0–46.0)
Lymphs Abs: 2.7 10*3/uL (ref 0.7–4.0)
MCHC: 34.2 g/dL (ref 30.0–36.0)
MCV: 83.3 fl (ref 78.0–100.0)
Monocytes Absolute: 0.4 10*3/uL (ref 0.1–1.0)
Monocytes Relative: 7.3 % (ref 3.0–12.0)
Neutro Abs: 2.8 10*3/uL (ref 1.4–7.7)
Neutrophils Relative %: 45.7 % (ref 43.0–77.0)
Platelets: 290 10*3/uL (ref 150.0–400.0)
RBC: 3.95 Mil/uL (ref 3.87–5.11)
RDW: 13.9 % (ref 11.5–15.5)
WBC: 6 10*3/uL (ref 4.0–10.5)

## 2023-10-16 LAB — VITAMIN D 25 HYDROXY (VIT D DEFICIENCY, FRACTURES): VITD: 30.24 ng/mL (ref 30.00–100.00)

## 2023-10-17 ENCOUNTER — Ambulatory Visit: Payer: Self-pay | Admitting: Family Medicine

## 2023-10-17 LAB — POC URINALSYSI DIPSTICK (AUTOMATED)
Bilirubin, UA: NEGATIVE
Glucose, UA: NEGATIVE
Ketones, UA: NEGATIVE
Leukocytes, UA: NEGATIVE
Nitrite, UA: NEGATIVE
Protein, UA: NEGATIVE
Spec Grav, UA: 1.01 (ref 1.010–1.025)
Urobilinogen, UA: NEGATIVE U/dL — AB
pH, UA: 6 (ref 5.0–8.0)

## 2023-10-17 LAB — URINE CULTURE
MICRO NUMBER:: 16478870
Result:: NO GROWTH
SPECIMEN QUALITY:: ADEQUATE

## 2023-10-23 DIAGNOSIS — B9689 Other specified bacterial agents as the cause of diseases classified elsewhere: Secondary | ICD-10-CM | POA: Diagnosis not present

## 2023-10-23 DIAGNOSIS — L089 Local infection of the skin and subcutaneous tissue, unspecified: Secondary | ICD-10-CM | POA: Diagnosis not present

## 2023-10-23 DIAGNOSIS — Z8619 Personal history of other infectious and parasitic diseases: Secondary | ICD-10-CM | POA: Diagnosis not present

## 2023-10-23 DIAGNOSIS — L0291 Cutaneous abscess, unspecified: Secondary | ICD-10-CM | POA: Diagnosis not present

## 2023-10-30 ENCOUNTER — Other Ambulatory Visit: Payer: Self-pay | Admitting: Family Medicine

## 2023-10-30 DIAGNOSIS — R599 Enlarged lymph nodes, unspecified: Secondary | ICD-10-CM

## 2023-10-31 ENCOUNTER — Telehealth (HOSPITAL_BASED_OUTPATIENT_CLINIC_OR_DEPARTMENT_OTHER): Payer: Self-pay

## 2023-11-04 ENCOUNTER — Emergency Department (HOSPITAL_BASED_OUTPATIENT_CLINIC_OR_DEPARTMENT_OTHER): Admission: EM | Admit: 2023-11-04 | Discharge: 2023-11-04 | Disposition: A | Discharge status: Home / Self Care

## 2023-11-04 ENCOUNTER — Other Ambulatory Visit: Payer: Self-pay

## 2023-11-04 DIAGNOSIS — L089 Local infection of the skin and subcutaneous tissue, unspecified: Secondary | ICD-10-CM | POA: Diagnosis not present

## 2023-11-04 DIAGNOSIS — R519 Headache, unspecified: Secondary | ICD-10-CM | POA: Insufficient documentation

## 2023-11-04 MED ORDER — SULFAMETHOXAZOLE-TRIMETHOPRIM 800-160 MG PO TABS: 1.0000 | ORAL_TABLET | Freq: Two times a day (BID) | ORAL | 0 refills | Status: AC

## 2023-11-04 MED ORDER — MUPIROCIN CALCIUM 2 % EX CREA: 1.0000 | TOPICAL_CREAM | Freq: Two times a day (BID) | CUTANEOUS | 0 refills | Status: AC
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
## 2023-11-04 NOTE — Discharge Instructions (Signed)
 As we discussed, please return to the emergency department should you begin to experience dizziness, vertigo, nausea/vomiting.  Otherwise over the next 24 hours, continue to rest, use ibuprofen /Tylenol  as needed for headache, and follow-up with your primary care within the next week to 2 weeks should you have further concerns.  Further, prescriptions have been sent for both Bactrim and mupirocin ointment.  Continue to use both of these and follow-up with your primary care in the next 1 to 2 weeks for evaluation of the progression of your wound.

## 2023-11-04 NOTE — ED Notes (Signed)
 Pt c/o 7/10 back of the head pain, denies LOC. Pt ambulatory. Pt also has a small abscess on the top of her pelvic region that has a small amount of bleeding. Pt c/o tenderness and draining.
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
## 2023-11-05 ENCOUNTER — Emergency Department (HOSPITAL_BASED_OUTPATIENT_CLINIC_OR_DEPARTMENT_OTHER)
Admission: EM | Admit: 2023-11-05 | Discharge: 2023-11-05 | Disposition: A | Discharge status: Home / Self Care | Attending: Emergency Medicine | Admitting: Emergency Medicine

## 2023-11-05 ENCOUNTER — Emergency Department (HOSPITAL_BASED_OUTPATIENT_CLINIC_OR_DEPARTMENT_OTHER)

## 2023-11-05 ENCOUNTER — Encounter (HOSPITAL_BASED_OUTPATIENT_CLINIC_OR_DEPARTMENT_OTHER): Payer: Self-pay

## 2023-11-05 DIAGNOSIS — M79662 Pain in left lower leg: Secondary | ICD-10-CM | POA: Insufficient documentation

## 2023-11-05 DIAGNOSIS — M79605 Pain in left leg: Secondary | ICD-10-CM | POA: Diagnosis not present

## 2023-11-05 DIAGNOSIS — M545 Low back pain, unspecified: Secondary | ICD-10-CM

## 2023-11-05 LAB — PREGNANCY, URINE: Preg Test, Ur: NEGATIVE

## 2023-11-05 MED ORDER — LIDOCAINE 5 % EX PTCH: 1.0000 | MEDICATED_PATCH | CUTANEOUS | 0 refills | Status: AC

## 2023-11-05 MED ORDER — CYCLOBENZAPRINE HCL 10 MG PO TABS: 10.0000 mg | ORAL_TABLET | Freq: Three times a day (TID) | ORAL | 0 refills | Status: AC | PRN

## 2023-11-05 NOTE — ED Triage Notes (Signed)
 Pt reports left lower leg pain since Saturday. Swelling calf area and pain shooting down leg to ankle.

## 2023-11-05 NOTE — ED Notes (Addendum)
Urine specimen in lab 

## 2023-11-05 NOTE — Discharge Instructions (Signed)
 Your ultrasound showed no blood clot in your leg.  Suspect pain is coming from the calf musculature.  Recommend continue use of anti-inflammatories in the outpatient setting.  Will send in muscle laxer as well as well as numbing patch.  Recommend follow-up with your primary care for reassessment.  Please do not hesitate to return to the emergency department if the worrisome signs and symptoms were discussed become apparent.

## 2023-11-05 NOTE — ED Provider Notes (Signed)
 [place] [facility] Provider Note   CSN: [id number] Arrival date & time: 11/05/23  4401     History  Chief Complaint  Patient presents with   Leg Pain    Jodi Norman is a 28 y.o. female.   Leg Pain   28 year old female presents emergency department complaints of left calf pain.  States that she has noticed this since Sunday.  States the pain is worsened since onset and said noticed some swelling in her left ankle that is improved with elevation of her leg.  Has a history of sarcoidosis and is concerned about developing a blood clot.  Denies any history of DVT/PE, recent surgeries/immobilization, known malignancy, known coagulopathy, hormonal therapy.  Denies any known trauma/falls/leg wounds.  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  Has taken over-the-counter medications with some improvement of symptoms.  Presents emergency department for further assessment/evaluation.  Past medical history significant for sarcoidosis, GERD, GAD, metabolic syndrome,  Home Medications Prior to Admission medications   Medication Sig Start Date End Date Taking? Authorizing Provider  cyclobenzaprine  (FLEXERIL ) 10 MG tablet Take 1 tablet (10 mg total) by mouth 3 (three) times daily as needed for muscle spasms. 06/11/23   Roel Clarity R, DO  Iron , Ferrous Sulfate , 325 (65 Fe) MG TABS Take 325 mg by mouth daily. 01/08/23   Lowne Chase, Yvonne R, DO  mupirocin cream (BACTROBAN) 2 % Apply 1 Application topically 2 (two) times daily. 11/04/23   Juanetta Nordmann, PA  naproxen  (NAPROSYN ) 375 MG tablet Take 1 tablet (375 mg total) by mouth 2 (two) times daily as needed. 05/06/23   Mayer, Jodi R, NP  sulfamethoxazole-trimethoprim (BACTRIM DS) 800-160 MG tablet Take 1 tablet by mouth 2 (two) times daily for 7 days. 11/04/23 11/11/23  Juanetta Nordmann, PA  Vitamin D , Ergocalciferol , (DRISDOL ) 1.25 MG (50000 UNIT) CAPS capsule TAKE 1 CAPSULE (50,000 UNITS TOTAL)  BY MOUTH EVERY 7 (SEVEN) DAYS 09/03/23   Lowne Chase, Yvonne R, DO      Allergies    Patient has no known allergies.    Review of Systems   Review of Systems  All other systems reviewed and are negative.   Physical Exam Updated Vital Signs BP 128/81 (BP Location: Right Arm)   Pulse 98   Temp 98.8 F (37.1 C) (Oral)   Resp 16   Ht 5\' 6"  (1.676 m)   Wt 113.4 kg   LMP 10/12/2023   SpO2 99%   BMI 40.35 kg/m  Physical Exam Vitals and nursing note reviewed.  Constitutional:      General: She is not in acute distress.    Appearance: She is well-developed.  HENT:     Head: Normocephalic and atraumatic.  Eyes:     Conjunctiva/sclera: Conjunctivae normal.  Cardiovascular:     Rate and Rhythm: Normal rate and regular rhythm.     Heart sounds: No murmur heard. Pulmonary:     Effort: Pulmonary effort is normal. No respiratory distress.     Breath sounds: Normal breath sounds.  Abdominal:     Palpations: Abdomen is soft.     Tenderness: There is no abdominal tenderness.  Musculoskeletal:        General: No swelling.     Cervical back: Neck supple.     Comments: Full range of motion bilateral hips, knees, ankles, digits.  Patient with tenderness on left proximal calf without palpable cord/nodule.  No lower extremity edema.Aaron Aas  Posterior tibial pulses 2+ bilaterally.  No obvious bony tenderness of left lower extremity.  No overlying erythema, palpable fluctuance/induration.  Skin:    General: Skin is warm and dry.     Capillary Refill: Capillary refill takes less than 2 seconds.  Neurological:     Mental Status: She is alert.  Psychiatric:        Mood and Affect: Mood normal.     ED Results / Procedures / Treatments   Labs (all labs ordered are listed, but only abnormal results are displayed) Labs Reviewed - No data to display  EKG None  Radiology No results found.  Procedures Procedures    Medications Ordered in ED Medications - No data to display  ED Course/  Medical Decision Making/ A&P                  NEXUS Criteria Score: 0                Medical Decision Making Amount and/or Complexity of Data Reviewed Labs: ordered.  Risk Prescription drug management.   This patient presents to the ED for concern of calf pain, this involves an extensive number of treatment options, and is a complaint that carries with it a high risk of complications and morbidity.  The differential diagnosis includes strain/sprain, DVT, ischemic limb, cellulitis, abscess, necrotizing infection, other   Co morbidities that complicate the patient evaluation  See HPI   Additional history obtained:  Additional history obtained from EMR External records from outside source obtained and reviewed including hospital records   Lab Tests:  I Ordered, and personally interpreted labs.  The pertinent results include: Urine pregnancy negative   Imaging Studies ordered:  I ordered imaging studies including DVT ultrasound I independently visualized and interpreted imaging which showed no acute abnormality I agree with the radiologist interpretation   Cardiac Monitoring: / EKG:  N/a   Consultations Obtained:  N/a   Problem List / ED Course / Critical interventions / Medication management  Left calf pain Reevaluation of the patient showed that the patient stayed the same I have reviewed the patients home medicines and have made adjustments as needed   Social Determinants of Health:  Denies tobacco, illicit drug use.   Test / Admission - Considered:  Left calf pain Vitals signs within normal range and stable throughout visit. Laboratory/imaging studies significant for: see above 28 year old female presents emergency department complaints of left calf pain.  States that she has noticed this since Sunday.  States the pain is worsened since onset and said noticed some swelling in her left ankle that is improved with elevation of her leg.  Has a history  of sarcoidosis and is concerned about developing a blood clot.  Denies any history of DVT/PE, recent surgeries/immobilization, known malignancy, known coagulopathy, hormonal therapy.  Denies any known trauma/falls/leg wounds.  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  Has taken over-the-counter medications with some improvement of symptoms.  Presents emergency department for further assessment/evaluation. On exam, tender palpation in the left proximal calf.  No lower extremity edema.  No pulse deficits suggest ischemic limb.  No obvious bony tenderness or reported traumatic mechanism to be suspicious for fracture/dislocation.  No overlying skin changes concerning for second infectious process.  DVT ultrasound was obtained given patient's history of sarcoidosis of which was negative.  Patient reassured by findings.  Suspect pain more muscular etiology.  Recommend symptomatic therapy as described in AVS with close follow-up with  PCP in the outpatient setting.  Treatment plan discussed with patient and she acknowledged understanding was agreeable to said plan.  Patient overall well-appearing, afebrile in no acute distress. Worrisome signs and symptoms were discussed with the patient, and the patient acknowledged understanding to return to the ED if noticed. Patient was stable upon discharge.          Final Clinical Impression(s) / ED Diagnoses Final diagnoses:  None    Rx / DC Orders ED Discharge Orders     None         [place], [place] 11/05/23 1158    Tegeler, Marine Sia, MD 11/05/23 1326

## 2023-11-07 ENCOUNTER — Telehealth (HOSPITAL_BASED_OUTPATIENT_CLINIC_OR_DEPARTMENT_OTHER): Payer: Self-pay

## 2023-11-16 ENCOUNTER — Ambulatory Visit (HOSPITAL_BASED_OUTPATIENT_CLINIC_OR_DEPARTMENT_OTHER): Admission: RE | Admit: 2023-11-16 | Source: Ambulatory Visit

## 2023-11-26 ENCOUNTER — Telehealth (HOSPITAL_BASED_OUTPATIENT_CLINIC_OR_DEPARTMENT_OTHER): Payer: Self-pay

## 2024-01-20 DIAGNOSIS — L700 Acne vulgaris: Secondary | ICD-10-CM | POA: Diagnosis not present

## 2024-02-29 DIAGNOSIS — N898 Other specified noninflammatory disorders of vagina: Secondary | ICD-10-CM | POA: Diagnosis not present

## 2024-03-30 ENCOUNTER — Encounter: Payer: Self-pay | Admitting: Radiology

## 2024-05-11 ENCOUNTER — Ambulatory Visit: Admitting: Family Medicine

## 2024-05-11 ENCOUNTER — Encounter: Payer: Self-pay | Admitting: Family Medicine

## 2024-05-11 VITALS — BP 114/60 | HR 94 | Temp 99.2°F | Resp 16 | Ht 66.0 in | Wt 268.4 lb

## 2024-05-11 DIAGNOSIS — E559 Vitamin D deficiency, unspecified: Secondary | ICD-10-CM

## 2024-05-11 DIAGNOSIS — J40 Bronchitis, not specified as acute or chronic: Secondary | ICD-10-CM

## 2024-05-11 DIAGNOSIS — D869 Sarcoidosis, unspecified: Secondary | ICD-10-CM | POA: Diagnosis not present

## 2024-05-11 DIAGNOSIS — D509 Iron deficiency anemia, unspecified: Secondary | ICD-10-CM

## 2024-05-11 LAB — CBC WITH DIFFERENTIAL/PLATELET
Basophils Absolute: 0 K/uL (ref 0.0–0.1)
Basophils Relative: 0.4 % (ref 0.0–3.0)
Eosinophils Absolute: 0.2 K/uL (ref 0.0–0.7)
Eosinophils Relative: 2.6 % (ref 0.0–5.0)
HCT: 31.5 % — ABNORMAL LOW (ref 36.0–46.0)
Hemoglobin: 10.6 g/dL — ABNORMAL LOW (ref 12.0–15.0)
Lymphocytes Relative: 33.5 % (ref 12.0–46.0)
Lymphs Abs: 2 K/uL (ref 0.7–4.0)
MCHC: 33.8 g/dL (ref 30.0–36.0)
MCV: 85.1 fl (ref 78.0–100.0)
Monocytes Absolute: 0.4 K/uL (ref 0.1–1.0)
Monocytes Relative: 7.6 % (ref 3.0–12.0)
Neutro Abs: 3.3 K/uL (ref 1.4–7.7)
Neutrophils Relative %: 55.9 % (ref 43.0–77.0)
Platelets: 231 K/uL (ref 150.0–400.0)
RBC: 3.7 Mil/uL — ABNORMAL LOW (ref 3.87–5.11)
RDW: 14.2 % (ref 11.5–15.5)
WBC: 5.9 K/uL (ref 4.0–10.5)

## 2024-05-11 LAB — COMPREHENSIVE METABOLIC PANEL WITH GFR
ALT: 11 U/L (ref 0–35)
AST: 14 U/L (ref 0–37)
Albumin: 3.8 g/dL (ref 3.5–5.2)
Alkaline Phosphatase: 76 U/L (ref 39–117)
BUN: 7 mg/dL (ref 6–23)
CO2: 29 meq/L (ref 19–32)
Calcium: 8.5 mg/dL (ref 8.4–10.5)
Chloride: 106 meq/L (ref 96–112)
Creatinine, Ser: 0.7 mg/dL (ref 0.40–1.20)
GFR: 117.97 mL/min (ref >60.00)
Glucose, Bld: 88 mg/dL (ref 70–99)
Potassium: 3.9 meq/L (ref 3.5–5.1)
Sodium: 140 meq/L (ref 135–145)
Total Bilirubin: 0.3 mg/dL (ref 0.2–1.2)
Total Protein: 6.4 g/dL (ref 6.0–8.3)

## 2024-05-11 MED ORDER — FLUCONAZOLE 150 MG PO TABS: 150.0000 mg | ORAL_TABLET | Freq: Every day | ORAL | 0 refills | Status: AC

## 2024-05-11 MED ORDER — IRON (FERROUS SULFATE) 325 (65 FE) MG PO TABS: 325.0000 mg | ORAL_TABLET | Freq: Every day | ORAL | 5 refills | Status: AC

## 2024-05-11 MED ORDER — PROMETHAZINE-DM 6.25-15 MG/5ML PO SYRP: 5.0000 mL | ORAL_SOLUTION | Freq: Four times a day (QID) | ORAL | 0 refills | Status: AC | PRN

## 2024-05-11 MED ORDER — AZITHROMYCIN 250 MG PO TABS: ORAL_TABLET | ORAL | 0 refills | Status: AC

## 2024-05-11 MED ORDER — VITAMIN D (ERGOCALCIFEROL) 1.25 MG (50000 UNIT) PO CAPS: 50000.0000 [IU] | ORAL_CAPSULE | ORAL | 1 refills | Status: AC

## 2024-05-11 NOTE — Progress Notes (Signed)
 Subjective:    Patient ID: Jodi Norman, female    DOB: 1995-06-26, 28 y.o.   MRN: [id number]  Chief Complaint  Patient presents with   Cough    Sxs started over a week ago, non pro cough, sore throat. Pt states states taking mucinex . No otc covid/flu    HPI Patient is in today for cough.  Discussed the use of AI scribe software for clinical note transcription with the patient, who gave verbal consent to proceed.  History of Present Illness Jodi Norman is a 28 year old female who presents with a persistent cough and congestion lasting over a week.  She has been experiencing symptoms of a cold for over a week, including a persistent cough, rhinorrhea, and significant nasal congestion. No fever is present, but she felt very unwell during the first two days of her illness. The congestion is described as thick, and she has difficulty expelling mucus despite efforts to do so. The cough is causing abdominal discomfort, similar to the sensation of having done crunches.  The cough is particularly disruptive at night, affecting her sleep. She uses Mucinex  primarily at night, which helps her sleep, but does not take it during the day. She also uses a saline nasal spray, which she finds ineffective. No wheezing is present, but she notes that her cough is accompanied by a sound she can hear.  She has a history of sarcoidosis and has not seen a rheumatologist for a year due to missed appointments. She was previously prescribed prednisone  but was unable to get a refill after missing appointments. She has not had her inflammation levels checked recently and feels 'very inflamed.' She also notes a need for more iron  and vitamin D , though she has not had these levels checked in a while.  No allergies are reported, and she has not experienced significant anxiety this year, despite previously being on medication for it. She has not seen a psychiatrist since an incident last year. Her menstrual cycles are  regular, and she is currently on day one of her cycle.  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     Past Medical History:  Diagnosis Date   Generalized anxiety disorder 06/24/2020   GERD (gastroesophageal reflux disease)    Sarcoidosis     Past Surgical History:  Procedure Laterality Date   CESAREAN SECTION     MOUTH SURGERY  05/2021   UPPER GI ENDOSCOPY  04/27/2021    Family History  Problem Relation Age of Onset   Hyperlipidemia Mother    Hypertension Mother    Hyperlipidemia Father    Hypertension Father    Diabetes Father    Colon cancer Neg Hx    Stomach cancer Neg Hx    Esophageal cancer Neg Hx    Pancreatic cancer Neg Hx    Colon polyps Neg Hx    Rectal cancer Neg Hx     Social History   Socioeconomic History   Marital status: Single    Spouse name: Not on file   Number of children: 1   Years of education: Not on file   Highest education level: Some college, no degree  Occupational History   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[employer]  Tobacco Use   Smoking status: Never   Smokeless tobacco: Never  Vaping Use   Vaping status: Never Used  Substance and Sexual Activity  Alcohol use: Never   Drug use: Never   Sexual activity: Not Currently    Birth control/protection: None  Other Topics Concern   Not on file  Social History Narrative   Right handed   Single, has one daughter born 2019   Works for the post [employer] in [place]   Social Drivers of Health   Tobacco Use: Low Risk (05/11/2024)   Patient History    Smoking Tobacco Use: Never    Smokeless Tobacco Use: Never    Passive Exposure: Not on file  Financial Resource Strain: Low Risk (10/15/2023)   Overall Financial Resource Strain (CARDIA)    Difficulty of Paying Living Expenses: Not very hard  Food Insecurity: No Food Insecurity (10/15/2023)   Hunger Vital Sign    Worried About Running Out of  Food in the Last Year: Never true    Ran Out of Food in the Last Year: Never true  Transportation Needs: No Transportation Needs (10/15/2023)   PRAPARE - Administrator, Civil Service (Medical): No    Lack of Transportation (Non-Medical): No  Physical Activity: Inactive (10/15/2023)   Exercise Vital Sign    Days of Exercise per Week: 0 days    Minutes of Exercise per Session: 10 min  Stress: Stress Concern Present (10/15/2023)   [employer] of Occupational Health - Occupational Stress Questionnaire    Feeling of Stress : To some extent  Social Connections: Socially Isolated (10/15/2023)   Social Connection and Isolation Panel    Frequency of Communication with Friends and Family: More than three times a week    Frequency of Social Gatherings with Friends and Family: Once a week    Attends Religious Services: Never    Database Administrator or Organizations: No    Attends Engineer, Structural: Not on file    Marital Status: Never married  Intimate Partner Violence: Unknown (01/11/2023)   Humiliation, Afraid, Rape, and Kick questionnaire    Fear of Current or Ex-Partner: Not on file    Emotionally Abused: No    Physically Abused: No    Sexually Abused: No  Depression (PHQ2-9): Medium Risk (05/11/2024)   Depression (PHQ2-9)    PHQ-2 Score: 6  Alcohol Screen: Not on file  Housing: High Risk (10/15/2023)   Housing Stability Vital Sign    Unable to Pay for Housing in the Last Year: Yes    Number of Times Moved in the Last Year: 0    Homeless in the Last Year: No  Utilities: Not At Risk (01/11/2023)   [facility] Utilities    Threatened with loss of utilities: No  Health Literacy: Not on file    Outpatient Medications Prior to Visit  Medication Sig Dispense Refill   cyclobenzaprine  (FLEXERIL ) 10 MG tablet Take 1 tablet (10 mg total) by mouth 3 (three) times daily as needed for muscle spasms. 30 tablet 0   lidocaine  (LIDODERM ) 5 % Place 1 patch onto the skin daily.  Remove & Discard patch within 12 hours or as directed by MD 30 patch 0   mupirocin  cream (BACTROBAN ) 2 % Apply 1 Application topically 2 (two) times daily. 15 g 0   naproxen  (NAPROSYN ) 375 MG tablet Take 1 tablet (375 mg total) by mouth 2 (two) times daily as needed. 14 tablet 0   Iron , Ferrous Sulfate , 325 (65 Fe) MG TABS Take 325 mg by mouth daily. 30 tablet 5   Vitamin D , Ergocalciferol , (DRISDOL ) 1.25 MG (50000 UNIT) CAPS capsule TAKE  1 CAPSULE (50,000 UNITS TOTAL) BY MOUTH EVERY 7 (SEVEN) DAYS 12 capsule 1   No facility-administered medications prior to visit.    Allergies[1]  Review of Systems  Constitutional:  Negative for fever and malaise/fatigue.  HENT:  Positive for sinus pain. Negative for congestion and sore throat.   Eyes:  Negative for blurred vision.  Respiratory:  Positive for cough and sputum production. Negative for shortness of breath and wheezing.   Cardiovascular:  Negative for chest pain, palpitations and leg swelling.  Gastrointestinal:  Negative for vomiting.  Musculoskeletal:  Negative for back pain.  Skin:  Negative for rash.  Neurological:  Negative for loss of consciousness and headaches.       Objective:    Physical Exam Vitals and nursing note reviewed.  Constitutional:      General: She is not in acute distress.    Appearance: Normal appearance. She is well-developed.  HENT:     Head: Normocephalic and atraumatic.     Right Ear: Tympanic membrane, ear canal and external ear normal.     Left Ear: Tympanic membrane, ear canal and external ear normal.     Nose:     Right Sinus: Maxillary sinus tenderness and frontal sinus tenderness present.     Left Sinus: Maxillary sinus tenderness and frontal sinus tenderness present.  Eyes:     General: No scleral icterus.       Right eye: No discharge.        Left eye: No discharge.  Cardiovascular:     Rate and Rhythm: Normal rate and regular rhythm.     Heart sounds: No murmur heard. Pulmonary:      Effort: Pulmonary effort is normal. No respiratory distress.     Breath sounds: Normal breath sounds.  Musculoskeletal:        General: Normal range of motion.     Cervical back: Normal range of motion and neck supple.     Right lower leg: No edema.     Left lower leg: No edema.  Skin:    General: Skin is warm and dry.  Neurological:     Mental Status: She is alert and oriented to person, place, and time.  Psychiatric:        Mood and Affect: Mood normal.        Behavior: Behavior normal.        Thought Content: Thought content normal.        Judgment: Judgment normal.     BP 114/60 (BP Location: Right Arm, Patient Position: Sitting, Cuff Size: Large)   Pulse 94   Temp 99.2 F (37.3 C) (Oral)   Resp 16   Ht 5' 6 (1.676 m)   Wt 268 lb 6.4 oz (121.7 kg)   SpO2 97%   BMI 43.32 kg/m  Wt Readings from Last 3 Encounters:  05/11/24 268 lb 6.4 oz (121.7 kg)  11/05/23 250 lb (113.4 kg)  11/04/23 250 lb 10.6 oz (113.7 kg)    Diabetic Foot Exam - Simple   No data filed    Lab Results  Component Value Date   WBC 6.0 10/15/2023   HGB 11.3 (L) 10/15/2023   HCT 32.9 (L) 10/15/2023   PLT 290.0 10/15/2023   GLUCOSE 91 10/15/2023   CHOL 161 01/08/2023   TRIG 83.0 01/08/2023   HDL 40.80 01/08/2023   LDLCALC 104 (H) 01/08/2023   ALT 10 10/15/2023   AST 14 10/15/2023   NA 136 10/15/2023   K 4.2 10/15/2023  CL 103 10/15/2023   CREATININE 0.67 10/15/2023   BUN 10 10/15/2023   CO2 25 10/15/2023   TSH 0.45 04/04/2023   INR 1.0 04/27/2021   HGBA1C 6.1 07/20/2022    Lab Results  Component Value Date   TSH 0.45 04/04/2023   Lab Results  Component Value Date   WBC 6.0 10/15/2023   HGB 11.3 (L) 10/15/2023   HCT 32.9 (L) 10/15/2023   MCV 83.3 10/15/2023   PLT 290.0 10/15/2023   Lab Results  Component Value Date   NA 136 10/15/2023   K 4.2 10/15/2023   CO2 25 10/15/2023   GLUCOSE 91 10/15/2023   BUN 10 10/15/2023   CREATININE 0.67 10/15/2023   BILITOT 0.2  10/15/2023   ALKPHOS 82 10/15/2023   AST 14 10/15/2023   ALT 10 10/15/2023   PROT 7.5 10/15/2023   ALBUMIN 4.3 10/15/2023   CALCIUM  9.2 10/15/2023   ANIONGAP 8 08/11/2023   GFR 119.70 10/15/2023   Lab Results  Component Value Date   CHOL 161 01/08/2023   Lab Results  Component Value Date   HDL 40.80 01/08/2023   Lab Results  Component Value Date   LDLCALC 104 (H) 01/08/2023   Lab Results  Component Value Date   TRIG 83.0 01/08/2023   Lab Results  Component Value Date   CHOLHDL 4 01/08/2023   Lab Results  Component Value Date   HGBA1C 6.1 07/20/2022       Assessment & Plan:  Vitamin D  deficiency -     Vitamin D  (Ergocalciferol ); Take 1 capsule (50,000 Units total) by mouth every 7 (seven) days.  Dispense: 12 capsule; Refill: 1 -     Fluconazole ; Take 1 tablet (150 mg total) by mouth daily. May repeat in 3 days if needed.  Dispense: 2 tablet; Refill: 0  Iron  deficiency anemia, unspecified iron  deficiency anemia type -     Iron  (Ferrous Sulfate ); Take 325 mg by mouth daily.  Dispense: 30 tablet; Refill: 5 -     CBC with Differential/Platelet -     Iron , TIBC and Ferritin Panel  Bronchitis -     Azithromycin ; Take 2 tablets on day 1, then 1 tablet daily on days 2 through 5  Dispense: 6 tablet; Refill: 0 -     Promethazine -DM; Take 5 mLs by mouth 4 (four) times daily as needed.  Dispense: 118 mL; Refill: 0  Sarcoidosis -     CBC with Differential/Platelet -     Comprehensive metabolic panel with GFR -     Ambulatory referral to Rheumatology  Assessment and Plan Assessment & Plan Bronchitis   She has a persistent cough for over a week with associated abdominal pain from coughing. There is no fever. The cough is non-productive with thick mucus and significant congestion. Bacterial bronchitis is considered, so azithromycin  is prescribed for 5 days. She uses Mucinex  to thin mucus but has difficulty expectorating it. Cough syrup is prescribed for nighttime use to aid  sleep. Continue Mucinex  during the day. Diflucan  is prescribed to prevent a potential yeast infection from antibiotic use.  Sarcoidosis   She feels inflamed, likely due to the current illness. Inflammation levels are expected to be elevated due to respiratory symptoms. No recent rheumatology follow-up due to missed appointments. She is advised to wait until recovery from the current illness before checking inflammation levels.  Iron  deficiency anemia   She reports needing more iron  and has not had levels checked recently. An iron  level test is ordered.  Vitamin D  deficiency   She reports needing more vitamin D  and has not had levels checked recently. A vitamin D  level test is ordered.    Kyliegh Jester R Lowne Chase, DO     [1] No Known Allergies

## 2024-05-12 LAB — IRON,TIBC AND FERRITIN PANEL
%SAT: 33 % (ref 16–45)
Ferritin: 15 ng/mL — ABNORMAL LOW (ref 16–154)
Iron: 110 ug/dL (ref 40–190)
TIBC: 335 ug/dL (ref 250–450)

## 2024-05-18 ENCOUNTER — Ambulatory Visit: Payer: Self-pay | Admitting: Family Medicine
